# Patient Record
Sex: Female | Born: 1965
Health system: Southern US, Community
[De-identification: ages and names within clinical notes are randomized; demographics above are authoritative.]

## PROBLEM LIST (undated history)

## (undated) ENCOUNTER — Inpatient Hospital Stay (HOSPITAL_COMMUNITY): Payer: 59

## (undated) DIAGNOSIS — B379 Candidiasis, unspecified: Secondary | ICD-10-CM

## (undated) DIAGNOSIS — Z8619 Personal history of other infectious and parasitic diseases: Secondary | ICD-10-CM

## (undated) DIAGNOSIS — M810 Age-related osteoporosis without current pathological fracture: Secondary | ICD-10-CM

## (undated) DIAGNOSIS — N189 Chronic kidney disease, unspecified: Secondary | ICD-10-CM

## (undated) DIAGNOSIS — K219 Gastro-esophageal reflux disease without esophagitis: Secondary | ICD-10-CM

## (undated) DIAGNOSIS — F4321 Adjustment disorder with depressed mood: Secondary | ICD-10-CM

## (undated) DIAGNOSIS — N393 Stress incontinence (female) (male): Secondary | ICD-10-CM

## (undated) DIAGNOSIS — F5105 Insomnia due to other mental disorder: Secondary | ICD-10-CM

## (undated) DIAGNOSIS — T8859XA Other complications of anesthesia, initial encounter: Secondary | ICD-10-CM

## (undated) DIAGNOSIS — D649 Anemia, unspecified: Secondary | ICD-10-CM

## (undated) DIAGNOSIS — N760 Acute vaginitis: Secondary | ICD-10-CM

## (undated) DIAGNOSIS — M6289 Other specified disorders of muscle: Secondary | ICD-10-CM

## (undated) DIAGNOSIS — G8929 Other chronic pain: Secondary | ICD-10-CM

## (undated) DIAGNOSIS — Z9884 Bariatric surgery status: Secondary | ICD-10-CM

## (undated) DIAGNOSIS — F332 Major depressive disorder, recurrent severe without psychotic features: Secondary | ICD-10-CM

## (undated) DIAGNOSIS — F4312 Post-traumatic stress disorder, chronic: Secondary | ICD-10-CM

## (undated) DIAGNOSIS — E785 Hyperlipidemia, unspecified: Secondary | ICD-10-CM

## (undated) DIAGNOSIS — G4730 Sleep apnea, unspecified: Secondary | ICD-10-CM

## (undated) DIAGNOSIS — G4700 Insomnia, unspecified: Secondary | ICD-10-CM

## (undated) DIAGNOSIS — R945 Abnormal results of liver function studies: Secondary | ICD-10-CM

## (undated) DIAGNOSIS — K8020 Calculus of gallbladder without cholecystitis without obstruction: Secondary | ICD-10-CM

## (undated) DIAGNOSIS — R5383 Other fatigue: Secondary | ICD-10-CM

## (undated) DIAGNOSIS — N952 Postmenopausal atrophic vaginitis: Secondary | ICD-10-CM

## (undated) DIAGNOSIS — B9689 Other specified bacterial agents as the cause of diseases classified elsewhere: Secondary | ICD-10-CM

## (undated) DIAGNOSIS — F419 Anxiety disorder, unspecified: Secondary | ICD-10-CM

## (undated) DIAGNOSIS — E559 Vitamin D deficiency, unspecified: Secondary | ICD-10-CM

## (undated) DIAGNOSIS — R5381 Other malaise: Secondary | ICD-10-CM

## (undated) DIAGNOSIS — Z87440 Personal history of urinary (tract) infections: Secondary | ICD-10-CM

## (undated) DIAGNOSIS — T7840XA Allergy, unspecified, initial encounter: Secondary | ICD-10-CM

## (undated) DIAGNOSIS — R1032 Left lower quadrant pain: Secondary | ICD-10-CM

## (undated) DIAGNOSIS — A599 Trichomoniasis, unspecified: Secondary | ICD-10-CM

## (undated) DIAGNOSIS — M549 Dorsalgia, unspecified: Secondary | ICD-10-CM

## (undated) DIAGNOSIS — N809 Endometriosis, unspecified: Secondary | ICD-10-CM

## (undated) DIAGNOSIS — N9089 Other specified noninflammatory disorders of vulva and perineum: Secondary | ICD-10-CM

## (undated) DIAGNOSIS — R160 Hepatomegaly, not elsewhere classified: Secondary | ICD-10-CM

## (undated) DIAGNOSIS — R7989 Other specified abnormal findings of blood chemistry: Secondary | ICD-10-CM

## (undated) DIAGNOSIS — M1990 Unspecified osteoarthritis, unspecified site: Secondary | ICD-10-CM

## (undated) DIAGNOSIS — I10 Essential (primary) hypertension: Secondary | ICD-10-CM

## (undated) DIAGNOSIS — T4145XA Adverse effect of unspecified anesthetic, initial encounter: Secondary | ICD-10-CM

## (undated) HISTORY — DX: Insomnia, unspecified: G47.00

## (undated) HISTORY — DX: Other specified abnormal findings of blood chemistry: R79.89

## (undated) HISTORY — DX: Anxiety disorder, unspecified: F41.9

## (undated) HISTORY — DX: Personal history of other infectious and parasitic diseases: Z86.19

## (undated) HISTORY — DX: Other complications of anesthesia, initial encounter: T88.59XA

## (undated) HISTORY — DX: Trichomoniasis, unspecified: A59.9

## (undated) HISTORY — PX: KNEE ARTHROSCOPY: SUR90

## (undated) HISTORY — DX: Left lower quadrant pain: R10.32

## (undated) HISTORY — DX: Endometriosis, unspecified: N80.9

## (undated) HISTORY — DX: Hyperlipidemia, unspecified: E78.5

## (undated) HISTORY — DX: Allergy, unspecified, initial encounter: T78.40XA

## (undated) HISTORY — DX: Other chronic pain: G89.29

## (undated) HISTORY — DX: Acute vaginitis: N76.0

## (undated) HISTORY — DX: Other specified noninflammatory disorders of vulva and perineum: N90.89

## (undated) HISTORY — DX: Dorsalgia, unspecified: M54.9

## (undated) HISTORY — DX: Calculus of gallbladder without cholecystitis without obstruction: K80.20

## (undated) HISTORY — DX: Other fatigue: R53.83

## (undated) HISTORY — DX: Age-related osteoporosis without current pathological fracture: M81.0

## (undated) HISTORY — DX: Other malaise: R53.81

## (undated) HISTORY — DX: Other specified bacterial agents as the cause of diseases classified elsewhere: B96.89

## (undated) HISTORY — PX: WISDOM TOOTH EXTRACTION: SHX21

## (undated) HISTORY — DX: Candidiasis, unspecified: B37.9

## (undated) HISTORY — DX: Adverse effect of unspecified anesthetic, initial encounter: T41.45XA

## (undated) HISTORY — DX: Gastro-esophageal reflux disease without esophagitis: K21.9

## (undated) HISTORY — DX: Unspecified osteoarthritis, unspecified site: M19.90

## (undated) HISTORY — DX: Adjustment disorder with depressed mood: F43.21

## (undated) HISTORY — PX: LAPAROTOMY: SHX154

## (undated) HISTORY — DX: Chronic kidney disease, unspecified: N18.9

## (undated) HISTORY — DX: Other specified disorders of muscle: M62.89

## (undated) HISTORY — DX: Morbid (severe) obesity due to excess calories: E66.01

## (undated) HISTORY — DX: Sleep apnea, unspecified: G47.30

## (undated) HISTORY — DX: Postmenopausal atrophic vaginitis: N95.2

## (undated) HISTORY — DX: Personal history of urinary (tract) infections: Z87.440

## (undated) HISTORY — DX: Essential (primary) hypertension: I10

## (undated) HISTORY — PX: DILATION AND CURETTAGE OF UTERUS: SHX78

## (undated) HISTORY — DX: Bariatric surgery status: Z98.84

## (undated) HISTORY — PX: TONSILLECTOMY: SUR1361

## (undated) HISTORY — DX: Insomnia due to other mental disorder: F51.05

## (undated) HISTORY — DX: Anemia, unspecified: D64.9

## (undated) HISTORY — DX: Abnormal results of liver function studies: R94.5

## (undated) HISTORY — DX: Vitamin D deficiency, unspecified: E55.9

## (undated) HISTORY — DX: Stress incontinence (female) (male): N39.3

## (undated) HISTORY — DX: Hepatomegaly, not elsewhere classified: R16.0

---

## 1997-09-13 ENCOUNTER — Ambulatory Visit (HOSPITAL_COMMUNITY): Admission: RE | Admit: 1997-09-13 | Discharge: 1997-09-13 | Payer: Self-pay | Admitting: Obstetrics and Gynecology

## 1997-12-03 ENCOUNTER — Ambulatory Visit (HOSPITAL_COMMUNITY): Admission: RE | Admit: 1997-12-03 | Discharge: 1997-12-03 | Payer: Self-pay | Admitting: Internal Medicine

## 1998-01-29 ENCOUNTER — Ambulatory Visit (HOSPITAL_COMMUNITY): Admission: RE | Admit: 1998-01-29 | Discharge: 1998-01-29 | Payer: Self-pay | Admitting: Internal Medicine

## 1998-09-16 ENCOUNTER — Inpatient Hospital Stay (HOSPITAL_COMMUNITY): Admission: AD | Admit: 1998-09-16 | Discharge: 1998-09-16 | Payer: Self-pay | Admitting: Cardiology

## 1998-09-17 ENCOUNTER — Encounter: Payer: Self-pay | Admitting: Internal Medicine

## 1998-09-17 ENCOUNTER — Encounter: Payer: Self-pay | Admitting: Cardiology

## 1998-09-17 ENCOUNTER — Encounter: Payer: Self-pay | Admitting: Emergency Medicine

## 1998-09-17 ENCOUNTER — Inpatient Hospital Stay (HOSPITAL_COMMUNITY): Admission: EM | Admit: 1998-09-17 | Discharge: 1998-09-23 | Payer: Self-pay | Admitting: Emergency Medicine

## 1998-11-10 ENCOUNTER — Ambulatory Visit (HOSPITAL_COMMUNITY): Admission: RE | Admit: 1998-11-10 | Discharge: 1998-11-10 | Payer: Self-pay | Admitting: Internal Medicine

## 1998-11-10 ENCOUNTER — Encounter: Payer: Self-pay | Admitting: Internal Medicine

## 1998-12-11 ENCOUNTER — Other Ambulatory Visit: Admission: RE | Admit: 1998-12-11 | Discharge: 1998-12-11 | Payer: Self-pay | Admitting: *Deleted

## 1998-12-27 ENCOUNTER — Emergency Department (HOSPITAL_COMMUNITY): Admission: EM | Admit: 1998-12-27 | Discharge: 1998-12-27 | Payer: Self-pay | Admitting: Emergency Medicine

## 1999-01-16 ENCOUNTER — Encounter: Admission: RE | Admit: 1999-01-16 | Discharge: 1999-04-01 | Payer: Self-pay | Admitting: Internal Medicine

## 1999-02-03 ENCOUNTER — Emergency Department (HOSPITAL_COMMUNITY): Admission: EM | Admit: 1999-02-03 | Discharge: 1999-02-04 | Payer: Self-pay | Admitting: Emergency Medicine

## 1999-02-04 ENCOUNTER — Encounter: Payer: Self-pay | Admitting: Emergency Medicine

## 1999-03-02 HISTORY — PX: APPENDECTOMY: SHX54

## 1999-03-02 HISTORY — PX: ABDOMINAL HYSTERECTOMY: SHX81

## 1999-03-11 ENCOUNTER — Encounter: Admission: RE | Admit: 1999-03-11 | Discharge: 1999-03-11 | Payer: Self-pay | Admitting: Orthopedic Surgery

## 1999-03-11 ENCOUNTER — Encounter: Payer: Self-pay | Admitting: Orthopedic Surgery

## 1999-03-24 ENCOUNTER — Encounter: Admission: RE | Admit: 1999-03-24 | Discharge: 1999-04-17 | Payer: Self-pay | Admitting: Orthopedic Surgery

## 1999-05-01 ENCOUNTER — Emergency Department (HOSPITAL_COMMUNITY): Admission: EM | Admit: 1999-05-01 | Discharge: 1999-05-01 | Payer: Self-pay | Admitting: Emergency Medicine

## 1999-05-08 ENCOUNTER — Encounter: Payer: Self-pay | Admitting: *Deleted

## 1999-05-08 ENCOUNTER — Encounter: Admission: RE | Admit: 1999-05-08 | Discharge: 1999-05-08 | Payer: Self-pay | Admitting: *Deleted

## 1999-05-19 ENCOUNTER — Encounter: Admission: RE | Admit: 1999-05-19 | Discharge: 1999-08-17 | Payer: Self-pay | Admitting: Internal Medicine

## 1999-09-04 ENCOUNTER — Inpatient Hospital Stay (HOSPITAL_COMMUNITY): Admission: AD | Admit: 1999-09-04 | Discharge: 1999-09-04 | Payer: Self-pay | Admitting: *Deleted

## 1999-09-05 ENCOUNTER — Inpatient Hospital Stay (HOSPITAL_COMMUNITY): Admission: AD | Admit: 1999-09-05 | Discharge: 1999-09-05 | Payer: Self-pay | Admitting: *Deleted

## 1999-09-18 ENCOUNTER — Encounter (INDEPENDENT_AMBULATORY_CARE_PROVIDER_SITE_OTHER): Payer: Self-pay

## 1999-09-18 ENCOUNTER — Inpatient Hospital Stay (HOSPITAL_COMMUNITY): Admission: RE | Admit: 1999-09-18 | Discharge: 1999-09-25 | Payer: Self-pay | Admitting: *Deleted

## 1999-09-22 ENCOUNTER — Encounter: Payer: Self-pay | Admitting: *Deleted

## 1999-09-24 ENCOUNTER — Encounter: Payer: Self-pay | Admitting: *Deleted

## 1999-09-24 ENCOUNTER — Encounter: Payer: Self-pay | Admitting: Obstetrics and Gynecology

## 1999-11-11 ENCOUNTER — Encounter: Admission: RE | Admit: 1999-11-11 | Discharge: 2000-02-09 | Payer: Self-pay | Admitting: Internal Medicine

## 2000-03-19 ENCOUNTER — Encounter: Payer: Self-pay | Admitting: Emergency Medicine

## 2000-03-19 ENCOUNTER — Emergency Department (HOSPITAL_COMMUNITY): Admission: EM | Admit: 2000-03-19 | Discharge: 2000-03-20 | Payer: Self-pay | Admitting: Emergency Medicine

## 2000-05-06 ENCOUNTER — Emergency Department (HOSPITAL_COMMUNITY): Admission: EM | Admit: 2000-05-06 | Discharge: 2000-05-06 | Payer: Self-pay | Admitting: Emergency Medicine

## 2000-12-06 ENCOUNTER — Encounter: Payer: Self-pay | Admitting: Emergency Medicine

## 2000-12-06 ENCOUNTER — Emergency Department (HOSPITAL_COMMUNITY): Admission: EM | Admit: 2000-12-06 | Discharge: 2000-12-06 | Payer: Self-pay | Admitting: Emergency Medicine

## 2001-06-12 ENCOUNTER — Encounter: Admission: RE | Admit: 2001-06-12 | Discharge: 2001-09-10 | Payer: Self-pay | Admitting: Internal Medicine

## 2001-07-17 ENCOUNTER — Other Ambulatory Visit: Admission: RE | Admit: 2001-07-17 | Discharge: 2001-07-17 | Payer: Self-pay | Admitting: *Deleted

## 2002-03-04 ENCOUNTER — Encounter: Payer: Self-pay | Admitting: Emergency Medicine

## 2002-03-04 ENCOUNTER — Emergency Department (HOSPITAL_COMMUNITY): Admission: EM | Admit: 2002-03-04 | Discharge: 2002-03-04 | Payer: Self-pay | Admitting: Emergency Medicine

## 2002-05-25 ENCOUNTER — Encounter: Payer: Self-pay | Admitting: Internal Medicine

## 2002-05-25 ENCOUNTER — Encounter: Admission: RE | Admit: 2002-05-25 | Discharge: 2002-05-25 | Payer: Self-pay | Admitting: Internal Medicine

## 2002-07-11 ENCOUNTER — Ambulatory Visit (HOSPITAL_COMMUNITY): Admission: RE | Admit: 2002-07-11 | Discharge: 2002-07-11 | Payer: Self-pay | Admitting: *Deleted

## 2002-07-11 ENCOUNTER — Encounter: Payer: Self-pay | Admitting: *Deleted

## 2002-08-01 ENCOUNTER — Other Ambulatory Visit: Admission: RE | Admit: 2002-08-01 | Discharge: 2002-08-01 | Payer: Self-pay | Admitting: *Deleted

## 2002-08-03 ENCOUNTER — Encounter: Admission: RE | Admit: 2002-08-03 | Discharge: 2002-08-03 | Payer: Self-pay | Admitting: *Deleted

## 2002-08-03 ENCOUNTER — Encounter: Payer: Self-pay | Admitting: *Deleted

## 2002-12-23 ENCOUNTER — Inpatient Hospital Stay (HOSPITAL_COMMUNITY): Admission: AD | Admit: 2002-12-23 | Discharge: 2002-12-23 | Payer: Self-pay | Admitting: Obstetrics and Gynecology

## 2003-03-02 DIAGNOSIS — G4730 Sleep apnea, unspecified: Secondary | ICD-10-CM

## 2003-03-02 HISTORY — DX: Sleep apnea, unspecified: G47.30

## 2003-03-02 HISTORY — DX: Morbid (severe) obesity due to excess calories: E66.01

## 2003-03-08 ENCOUNTER — Emergency Department (HOSPITAL_COMMUNITY): Admission: EM | Admit: 2003-03-08 | Discharge: 2003-03-08 | Payer: Self-pay | Admitting: Emergency Medicine

## 2003-03-21 DIAGNOSIS — N9089 Other specified noninflammatory disorders of vulva and perineum: Secondary | ICD-10-CM

## 2003-03-21 DIAGNOSIS — Z8619 Personal history of other infectious and parasitic diseases: Secondary | ICD-10-CM

## 2003-03-21 HISTORY — DX: Personal history of other infectious and parasitic diseases: Z86.19

## 2003-03-21 HISTORY — DX: Other specified noninflammatory disorders of vulva and perineum: N90.89

## 2003-07-30 ENCOUNTER — Ambulatory Visit (HOSPITAL_COMMUNITY): Admission: RE | Admit: 2003-07-30 | Discharge: 2003-07-30 | Payer: Self-pay | Admitting: Obstetrics and Gynecology

## 2003-08-12 ENCOUNTER — Other Ambulatory Visit: Admission: RE | Admit: 2003-08-12 | Discharge: 2003-08-12 | Payer: Self-pay | Admitting: Obstetrics and Gynecology

## 2003-08-12 DIAGNOSIS — N393 Stress incontinence (female) (male): Secondary | ICD-10-CM

## 2003-08-12 HISTORY — DX: Stress incontinence (female) (male): N39.3

## 2003-08-18 ENCOUNTER — Emergency Department (HOSPITAL_COMMUNITY): Admission: EM | Admit: 2003-08-18 | Discharge: 2003-08-18 | Payer: Self-pay | Admitting: Emergency Medicine

## 2003-12-01 ENCOUNTER — Ambulatory Visit (HOSPITAL_BASED_OUTPATIENT_CLINIC_OR_DEPARTMENT_OTHER): Admission: RE | Admit: 2003-12-01 | Discharge: 2003-12-01 | Payer: Self-pay | Admitting: Cardiology

## 2003-12-10 DIAGNOSIS — M1990 Unspecified osteoarthritis, unspecified site: Secondary | ICD-10-CM

## 2003-12-10 HISTORY — DX: Unspecified osteoarthritis, unspecified site: M19.90

## 2004-03-01 DIAGNOSIS — B9689 Other specified bacterial agents as the cause of diseases classified elsewhere: Secondary | ICD-10-CM

## 2004-03-01 HISTORY — DX: Other specified bacterial agents as the cause of diseases classified elsewhere: B96.89

## 2004-04-22 ENCOUNTER — Ambulatory Visit (HOSPITAL_COMMUNITY): Admission: RE | Admit: 2004-04-22 | Discharge: 2004-04-22 | Payer: Self-pay | Admitting: Family Medicine

## 2004-04-28 ENCOUNTER — Ambulatory Visit (HOSPITAL_COMMUNITY): Admission: RE | Admit: 2004-04-28 | Discharge: 2004-04-28 | Payer: Self-pay | Admitting: Gastroenterology

## 2004-05-01 ENCOUNTER — Ambulatory Visit (HOSPITAL_COMMUNITY): Admission: RE | Admit: 2004-05-01 | Discharge: 2004-05-01 | Payer: Self-pay | Admitting: Gastroenterology

## 2004-05-04 ENCOUNTER — Ambulatory Visit (HOSPITAL_COMMUNITY): Admission: RE | Admit: 2004-05-04 | Discharge: 2004-05-04 | Payer: Self-pay | Admitting: Gastroenterology

## 2004-07-09 ENCOUNTER — Ambulatory Visit (HOSPITAL_COMMUNITY): Admission: RE | Admit: 2004-07-09 | Discharge: 2004-07-09 | Payer: Self-pay | Admitting: Obstetrics and Gynecology

## 2004-07-30 ENCOUNTER — Ambulatory Visit (HOSPITAL_COMMUNITY): Admission: RE | Admit: 2004-07-30 | Discharge: 2004-07-30 | Payer: Self-pay | Admitting: Obstetrics and Gynecology

## 2004-08-11 ENCOUNTER — Encounter: Admission: RE | Admit: 2004-08-11 | Discharge: 2004-11-09 | Payer: Self-pay | Admitting: Internal Medicine

## 2004-08-13 ENCOUNTER — Other Ambulatory Visit: Admission: RE | Admit: 2004-08-13 | Discharge: 2004-08-13 | Payer: Self-pay | Admitting: Obstetrics and Gynecology

## 2004-08-13 DIAGNOSIS — R160 Hepatomegaly, not elsewhere classified: Secondary | ICD-10-CM

## 2004-08-13 HISTORY — DX: Hepatomegaly, not elsewhere classified: R16.0

## 2004-09-14 ENCOUNTER — Ambulatory Visit (HOSPITAL_COMMUNITY): Admission: RE | Admit: 2004-09-14 | Discharge: 2004-09-14 | Payer: Self-pay | Admitting: Internal Medicine

## 2004-10-19 ENCOUNTER — Ambulatory Visit (HOSPITAL_COMMUNITY): Admission: RE | Admit: 2004-10-19 | Discharge: 2004-10-19 | Payer: Self-pay | Admitting: Gastroenterology

## 2004-12-09 ENCOUNTER — Encounter: Admission: RE | Admit: 2004-12-09 | Discharge: 2005-02-28 | Payer: Self-pay | Admitting: Internal Medicine

## 2005-03-01 HISTORY — PX: GASTRIC BYPASS: SHX52

## 2005-05-06 ENCOUNTER — Inpatient Hospital Stay (HOSPITAL_COMMUNITY): Admission: AD | Admit: 2005-05-06 | Discharge: 2005-05-06 | Payer: Self-pay | Admitting: Obstetrics and Gynecology

## 2005-05-21 ENCOUNTER — Ambulatory Visit (HOSPITAL_COMMUNITY): Admission: RE | Admit: 2005-05-21 | Discharge: 2005-05-21 | Payer: Self-pay | Admitting: Surgery

## 2005-05-27 ENCOUNTER — Encounter: Admission: RE | Admit: 2005-05-27 | Discharge: 2005-08-12 | Payer: Self-pay | Admitting: Surgery

## 2005-08-03 ENCOUNTER — Inpatient Hospital Stay (HOSPITAL_COMMUNITY): Admission: RE | Admit: 2005-08-03 | Discharge: 2005-08-08 | Payer: Self-pay | Admitting: Surgery

## 2005-08-04 ENCOUNTER — Encounter: Payer: Self-pay | Admitting: Vascular Surgery

## 2005-08-16 ENCOUNTER — Emergency Department (HOSPITAL_COMMUNITY): Admission: EM | Admit: 2005-08-16 | Discharge: 2005-08-16 | Payer: Self-pay | Admitting: Emergency Medicine

## 2005-08-26 ENCOUNTER — Ambulatory Visit (HOSPITAL_COMMUNITY): Admission: RE | Admit: 2005-08-26 | Discharge: 2005-08-26 | Payer: Self-pay | Admitting: Surgery

## 2005-10-13 ENCOUNTER — Emergency Department (HOSPITAL_COMMUNITY): Admission: EM | Admit: 2005-10-13 | Discharge: 2005-10-14 | Payer: Self-pay | Admitting: Emergency Medicine

## 2005-11-11 ENCOUNTER — Ambulatory Visit (HOSPITAL_COMMUNITY): Admission: RE | Admit: 2005-11-11 | Discharge: 2005-11-11 | Payer: Self-pay | Admitting: Obstetrics and Gynecology

## 2005-12-17 ENCOUNTER — Encounter: Admission: RE | Admit: 2005-12-17 | Discharge: 2006-03-17 | Payer: Self-pay | Admitting: Surgery

## 2006-01-03 ENCOUNTER — Other Ambulatory Visit: Admission: RE | Admit: 2006-01-03 | Discharge: 2006-01-03 | Payer: Self-pay | Admitting: Obstetrics and Gynecology

## 2006-03-31 ENCOUNTER — Ambulatory Visit (HOSPITAL_COMMUNITY): Admission: RE | Admit: 2006-03-31 | Discharge: 2006-03-31 | Payer: Self-pay | Admitting: Surgery

## 2006-04-20 ENCOUNTER — Encounter: Admission: RE | Admit: 2006-04-20 | Discharge: 2006-07-19 | Payer: Self-pay | Admitting: Surgery

## 2006-06-19 ENCOUNTER — Inpatient Hospital Stay (HOSPITAL_COMMUNITY): Admission: AD | Admit: 2006-06-19 | Discharge: 2006-06-19 | Payer: Self-pay | Admitting: Obstetrics and Gynecology

## 2006-06-19 ENCOUNTER — Emergency Department (HOSPITAL_COMMUNITY): Admission: EM | Admit: 2006-06-19 | Discharge: 2006-06-20 | Payer: Self-pay | Admitting: Emergency Medicine

## 2006-08-15 ENCOUNTER — Encounter: Admission: RE | Admit: 2006-08-15 | Discharge: 2006-08-15 | Payer: Self-pay | Admitting: Surgery

## 2006-08-22 ENCOUNTER — Emergency Department (HOSPITAL_COMMUNITY): Admission: EM | Admit: 2006-08-22 | Discharge: 2006-08-22 | Payer: Self-pay | Admitting: Emergency Medicine

## 2006-12-07 ENCOUNTER — Ambulatory Visit (HOSPITAL_COMMUNITY): Admission: RE | Admit: 2006-12-07 | Discharge: 2006-12-07 | Payer: Self-pay | Admitting: Obstetrics and Gynecology

## 2007-03-02 DIAGNOSIS — N952 Postmenopausal atrophic vaginitis: Secondary | ICD-10-CM

## 2007-03-02 HISTORY — DX: Postmenopausal atrophic vaginitis: N95.2

## 2007-05-21 ENCOUNTER — Emergency Department (HOSPITAL_COMMUNITY): Admission: EM | Admit: 2007-05-21 | Discharge: 2007-05-21 | Payer: Self-pay | Admitting: Family Medicine

## 2007-06-06 DIAGNOSIS — F4321 Adjustment disorder with depressed mood: Secondary | ICD-10-CM

## 2007-06-06 HISTORY — DX: Insomnia due to other mental disorder: F43.21

## 2007-07-04 ENCOUNTER — Encounter: Admission: RE | Admit: 2007-07-04 | Discharge: 2007-07-18 | Payer: Self-pay | Admitting: Surgery

## 2007-08-19 ENCOUNTER — Ambulatory Visit (HOSPITAL_BASED_OUTPATIENT_CLINIC_OR_DEPARTMENT_OTHER): Admission: RE | Admit: 2007-08-19 | Discharge: 2007-08-19 | Payer: Self-pay | Admitting: Surgery

## 2007-08-27 ENCOUNTER — Ambulatory Visit: Payer: Self-pay | Admitting: Internal Medicine

## 2007-12-05 ENCOUNTER — Encounter: Admission: RE | Admit: 2007-12-05 | Discharge: 2008-01-29 | Payer: Self-pay | Admitting: Surgery

## 2007-12-08 ENCOUNTER — Ambulatory Visit (HOSPITAL_COMMUNITY): Admission: RE | Admit: 2007-12-08 | Discharge: 2007-12-08 | Payer: Self-pay | Admitting: Obstetrics and Gynecology

## 2008-01-29 ENCOUNTER — Emergency Department (HOSPITAL_COMMUNITY): Admission: EM | Admit: 2008-01-29 | Discharge: 2008-01-29 | Payer: Self-pay | Admitting: Emergency Medicine

## 2008-12-27 ENCOUNTER — Emergency Department (HOSPITAL_COMMUNITY): Admission: EM | Admit: 2008-12-27 | Discharge: 2008-12-27 | Payer: Self-pay | Admitting: Family Medicine

## 2008-12-31 ENCOUNTER — Ambulatory Visit (HOSPITAL_COMMUNITY): Admission: RE | Admit: 2008-12-31 | Discharge: 2008-12-31 | Payer: Self-pay | Admitting: Obstetrics and Gynecology

## 2009-01-05 ENCOUNTER — Ambulatory Visit (HOSPITAL_COMMUNITY): Admission: RE | Admit: 2009-01-05 | Discharge: 2009-01-05 | Payer: Self-pay | Admitting: Podiatry

## 2009-03-01 HISTORY — PX: CHOLECYSTECTOMY: SHX55

## 2009-06-01 ENCOUNTER — Emergency Department (HOSPITAL_COMMUNITY): Admission: EM | Admit: 2009-06-01 | Discharge: 2009-06-01 | Payer: Self-pay | Admitting: Family Medicine

## 2009-06-02 ENCOUNTER — Encounter: Admission: RE | Admit: 2009-06-02 | Discharge: 2009-06-02 | Payer: Self-pay | Admitting: Surgery

## 2009-08-13 ENCOUNTER — Encounter: Admission: RE | Admit: 2009-08-13 | Discharge: 2009-08-13 | Payer: Self-pay | Admitting: Surgery

## 2009-09-10 ENCOUNTER — Inpatient Hospital Stay (HOSPITAL_COMMUNITY): Admission: EM | Admit: 2009-09-10 | Discharge: 2009-09-13 | Payer: Self-pay | Admitting: Surgery

## 2009-11-15 ENCOUNTER — Ambulatory Visit (HOSPITAL_COMMUNITY): Admission: RE | Admit: 2009-11-15 | Discharge: 2009-11-15 | Payer: Self-pay | Admitting: Surgery

## 2009-11-25 ENCOUNTER — Ambulatory Visit (HOSPITAL_COMMUNITY): Admission: RE | Admit: 2009-11-25 | Discharge: 2009-11-25 | Payer: Self-pay | Admitting: Surgery

## 2009-12-02 ENCOUNTER — Ambulatory Visit (HOSPITAL_COMMUNITY): Admission: RE | Admit: 2009-12-02 | Discharge: 2009-12-03 | Payer: Self-pay | Admitting: Surgery

## 2009-12-02 ENCOUNTER — Encounter (INDEPENDENT_AMBULATORY_CARE_PROVIDER_SITE_OTHER): Payer: Self-pay | Admitting: Surgery

## 2010-01-12 ENCOUNTER — Ambulatory Visit (HOSPITAL_COMMUNITY): Admission: RE | Admit: 2010-01-12 | Discharge: 2010-01-12 | Payer: Self-pay | Admitting: Obstetrics and Gynecology

## 2010-03-01 DIAGNOSIS — R5383 Other fatigue: Secondary | ICD-10-CM

## 2010-03-01 DIAGNOSIS — R5381 Other malaise: Secondary | ICD-10-CM

## 2010-03-01 HISTORY — DX: Other fatigue: R53.83

## 2010-03-01 HISTORY — DX: Other malaise: R53.81

## 2010-03-22 ENCOUNTER — Encounter: Payer: Self-pay | Admitting: Obstetrics and Gynecology

## 2010-03-22 ENCOUNTER — Encounter: Payer: Self-pay | Admitting: Surgery

## 2010-03-23 ENCOUNTER — Encounter: Payer: Self-pay | Admitting: Internal Medicine

## 2010-05-14 LAB — COMPREHENSIVE METABOLIC PANEL
ALT: 61 U/L — ABNORMAL HIGH (ref 0–35)
CO2: 33 mEq/L — ABNORMAL HIGH (ref 19–32)
Calcium: 9.1 mg/dL (ref 8.4–10.5)
GFR calc Af Amer: >60 mL/min (ref >60)
Glucose, Bld: 103 mg/dL — ABNORMAL HIGH (ref 70–99)
Sodium: 137 mEq/L (ref 135–145)

## 2010-05-14 LAB — SURGICAL PCR SCREEN
MRSA, PCR: NEGATIVE
Staphylococcus aureus: NEGATIVE

## 2010-05-14 LAB — CBC
HCT: 38.3 % (ref 36.0–46.0)
Hemoglobin: 12.2 g/dL (ref 12.0–15.0)
MCH: 28.4 pg (ref 26.0–34.0)
MCV: 86.9 fL (ref 78.0–100.0)
MCV: 88.5 fL (ref 78.0–100.0)
Platelets: 243 10*3/uL (ref 150–400)
Platelets: 245 10*3/uL (ref 150–400)
RBC: 4.33 MIL/uL (ref 3.87–5.11)
RBC: 4.51 MIL/uL (ref 3.87–5.11)
RDW: 14 % (ref 11.5–15.5)
WBC: 7.6 10*3/uL (ref 4.0–10.5)

## 2010-05-14 LAB — CREATININE, SERUM
Creatinine, Ser: 0.8 mg/dL (ref 0.4–1.2)
GFR calc non Af Amer: >60 mL/min (ref >60)

## 2010-05-14 LAB — DIFFERENTIAL
Eosinophils Relative: 0 % (ref 0–5)
Lymphocytes Relative: 26 % (ref 12–46)

## 2010-05-16 LAB — CBC
MCH: 28.4 pg (ref 26.0–34.0)
MCHC: 33 g/dL (ref 30.0–36.0)
RDW: 13.7 % (ref 11.5–15.5)

## 2010-05-16 LAB — DIFFERENTIAL
Basophils Relative: 0 % (ref 0–1)
Eosinophils Absolute: 0 10*3/uL (ref 0.0–0.7)
Eosinophils Relative: 1 % (ref 0–5)
Monocytes Absolute: 0.4 10*3/uL (ref 0.1–1.0)
Monocytes Relative: 11 % (ref 3–12)
Neutrophils Relative %: 44 % (ref 43–77)

## 2010-05-16 LAB — CLOTEST (H. PYLORI), BIOPSY: Helicobacter screen: NEGATIVE

## 2010-05-17 LAB — URINALYSIS, ROUTINE W REFLEX MICROSCOPIC
Bilirubin Urine: NEGATIVE
Hgb urine dipstick: NEGATIVE
Ketones, ur: 15 mg/dL — AB
Nitrite: NEGATIVE
Specific Gravity, Urine: 1.023 (ref 1.005–1.030)
Urobilinogen, UA: 0.2 mg/dL (ref 0.0–1.0)
pH: 7 (ref 5.0–8.0)

## 2010-05-17 LAB — CBC
MCV: 86.2 fL (ref 78.0–100.0)
Platelets: 264 10*3/uL (ref 150–400)
RDW: 14 % (ref 11.5–15.5)
WBC: 4.7 10*3/uL (ref 4.0–10.5)

## 2010-05-17 LAB — COMPREHENSIVE METABOLIC PANEL
Albumin: 3.8 g/dL (ref 3.5–5.2)
Alkaline Phosphatase: 68 U/L (ref 39–117)
BUN: 15 mg/dL (ref 6–23)
Calcium: 9.1 mg/dL (ref 8.4–10.5)
Creatinine, Ser: 0.92 mg/dL (ref 0.4–1.2)
Potassium: 3.9 mEq/L (ref 3.5–5.1)
Total Protein: 6.8 g/dL (ref 6.0–8.3)

## 2010-06-25 ENCOUNTER — Other Ambulatory Visit: Payer: Self-pay | Admitting: Orthopaedic Surgery

## 2010-06-25 DIAGNOSIS — M545 Low back pain, unspecified: Secondary | ICD-10-CM

## 2010-06-28 ENCOUNTER — Ambulatory Visit
Admission: RE | Admit: 2010-06-28 | Discharge: 2010-06-28 | Disposition: A | Discharge status: Home / Self Care | Payer: Commercial Managed Care - PPO | Source: Ambulatory Visit | Attending: Orthopaedic Surgery | Admitting: Orthopaedic Surgery

## 2010-06-28 DIAGNOSIS — M545 Low back pain, unspecified: Secondary | ICD-10-CM

## 2010-07-17 NOTE — H&P (Signed)
[facility]  Patient:    Carly Delacruz, Carly Delacruz                    MRN: [id number] Adm. Date:  [id number] Disc. Date: [id number] Attending:  Pleas Koch                         History and Physical  ADMISSION DIAGNOSIS:          Chronic pelvic pain and endometriosis unresponsive to medical management.  HISTORY OF PRESENT ILLNESS:   This is a 45 year old African-American female who is a gravida 0 with a history of endometriosis dating back to 85 and multiple laparoscopies diagnosing adhesions and endometriosis.  The patient had been on Lupron which had controlled her pain until recently when she had had increased pain and bleeding.  She had tried nonsteroidal anti-inflammatories, narcotics, oral contraceptives, and progestins, all without relief.  She is brought in for a total abdominal hysterectomy and left salpingo-oophorectomy as the majority of her pain is in the midline and on the left.  PAST MEDICAL HISTORY:         The patient has a history of hypertension on Maxzide.  She has a history of joint problems and she is on Celebrex.  PAST SURGICAL HISTORY:        The patient has had a previous appendectomy, knee surgery, tonsillectomy, and laparotomy.  ALLERGIES:                    The patient is allergic to [facility] and CONTRAST MEDIA.  REVIEW OF SYSTEMS:            Other review of systems is negative.  SOCIAL HISTORY:               The patient is a nonsmoker.  No recreational drugs or alcohol.  FAMILY HISTORY:               Positive for liver problems and hypertension.  PHYSICAL EXAMINATION:         An obese African-American female.  WEIGHT:                       283 pounds.  HEIGHT:                       5 feet 4-1/2 inches.  VITAL SIGNS:                  Blood pressure 130/88.  HEENT:                        Normal.  Thyroid without masses or enlargement.  LUNGS:                        Clear to auscultation.  BREASTS:                       Without masses.  HEART:                        Regular rate and rhythm.  ABDOMEN:                      Obese with tenderness in her left lower quadrant.  No rebound, mass, or guarding.  No CVA tenderness.  PELVIC:  Exam shows normal external genitalia and normal vagina.  No discharge.  No blood.  The cervix is nontender.  The uterus is normal in size, shape, and consistency and nonspecific nontender.  There is tenderness in the adnexa without appreciable mass.  The right adnexa is nontender.  IMPRESSION:                   Chronic pelvic pain and history of endometriosis unresponsive to medical management.  PLAN:                         The patient is scheduled for total abdominal hysterectomy and left salpingo-oophorectomy with right ovarian conservation if the right ovary is not significantly involved in the disease process.  The patient is aware of the risks and complications of the procedure, including bowel, bladder, or vascular injury, fistula formation unrelief of pain, persistence or worsening of pain, anesthetic complications, and wound infection.  She is willing to proceed.  She will proceed on September 18, 1999, at [facility]. DD:  09/17/99 TD:  09/17/99 Job: [id number]

## 2010-07-17 NOTE — Procedures (Signed)
NAMEKENLEI, Carly Delacruz             ACCOUNT NO.:  [id number]   MEDICAL RECORD NO.:  [id number]          PATIENT TYPE:  OUT   LOCATION:  SLEEP CENTER                 FACILITY:  [facility]   PHYSICIAN:  Clinton D. Maple Hudson, MD, FCCP, FACPDATE OF BIRTH:  13-Jun-1963   DATE OF STUDY:                            NOCTURNAL POLYSOMNOGRAM   REFERRING PHYSICIAN:  Molli Hazard B. Daphine Deutscher, MD   INDICATION FOR STUDY:  Hypersomnia with sleep apnea.   EPWORTH SLEEPINESS SCORE:  12/24.  BMI 36, weight 201 pounds, height 63  inches, neck 14 inches.   MEDICATIONS:  Home medications charted and reviewed.   SLEEP ARCHITECTURE:  Total sleep time 358 minutes with sleep efficiency  90.3%.  Stage I was 4.3%.  Stage II 75.7%.  Stage III absent.  REM 19.9%  of total sleep time.  Sleep latency 7 minutes.  REM latency 78.5  minutes.  Awake after sleep onset 29 minutes.  Arousal index 14.6.  Bedtime medication:  Ambien.   RESPIRATORY DATA:  Apnea/hypopnea index (AHI) 2.5 per hour.  Respiratory  disturbance index (RDI) 4.4 per hour.  A total of 15 events were scored  including 1 central apnea and 14 hypopneas.  Events were not positional.  REM AHI 2.5.   OXYGEN DATA:  Moderate snoring with oxygen desaturation to a nadir of  79%.  Mean oxygen saturation through the study was 94.6% on room air.   CARDIAC DATA:  Normal sinus rhythm.   MOVEMENT-PARASOMNIA:  No significant movement disturbance.  Bathroom x1.   IMPRESSIONS-RECOMMENDATIONS:  Occasional respiratory events within  normal limits.  AHI 2.5 per hour (normal range 0-5 per hour).  Moderate  snoring with oxygen desaturation to a nadir of 79% transiently.  Mean  saturation was 94.6% on room air.      Clinton D. Maple Hudson, MD, [facility], FACP  Diplomate, Biomedical engineer of Sleep Medicine  Electronically Signed     CDY/MEDQ  D:  08/26/2007 09:16:27  T:  08/26/2007 09:54:05  Job:  [id number]

## 2010-07-17 NOTE — Op Note (Signed)
NAMESHAMBRIA, Carly Delacruz             ACCOUNT NO.:  [id number]   MEDICAL RECORD NO.:  [id number]          PATIENT TYPE:  INP   LOCATION:  0153                         FACILITY:  [facility]   PHYSICIAN:  Alfonse Ras, MD   DATE OF BIRTH:  04-20-1965   DATE OF PROCEDURE:  08/03/2005  DATE OF DISCHARGE:                                 OPERATIVE REPORT   PREOPERATIVE DIAGNOSIS:  Status post laparoscopic Roux-en-Y gastric bypass.   POSTOPERATIVE DIAGNOSIS:  Status post laparoscopic Roux-en-Y gastric bypass,  evidence of patency of the gastrojejunostomy, but a leak at the  gastrojejunostomy.   PROCEDURE:  The Olympus endoscope was introduced orally into the patient at  the completion of laparoscopic Roux-en-Y gastric bypass.  The anastomosis  was patent, but we did see some bubbles with irrigation.  The endoscope was  left in place and Dr. Daphine Deutscher repaired the area of leak with interrupted  silks, and I re-endoscoped the patient.  That time again showed patency of  the anastomosis, but no evidence of leak.  The remainder of the operative  report will be dictated by Dr. Luretha Murphy.      Alfonse Ras, MD  Electronically Signed     KRE/MEDQ  D:  08/04/2005  T:  08/05/2005  Job:  [id number]

## 2010-07-17 NOTE — Op Note (Signed)
Carly Delacruz, Carly Delacruz             ACCOUNT NO.:  [id number]   MEDICAL RECORD NO.:  [id number]          PATIENT TYPE:  INP   LOCATION:  0153                         FACILITY:  [facility]   PHYSICIAN:  Thornton Park. Daphine Deutscher, MD  DATE OF BIRTH:  12/16/1965   DATE OF PROCEDURE:  08/03/2005  DATE OF DISCHARGE:                                 OPERATIVE REPORT   PROCEDURE:  Laparoscopic Roux-en-Y gastric bypass.   SURGEON:  Dr. Daphine Deutscher   ASSISTANT:  Alfonse Ras, M.D.   ANESTHESIA:  General endotracheal.   DESCRIPTION OF PROCEDURE:  Carly Delacruz is a 45 year old morbidly  obese patient who was taken to room one at [facility] on  August 03, 2005.  The patient has had previous laparotomies for endometriosis,  laparoscopy, hysterectomy, and appendectomy.  Abdomen was prepped with  chlorhexidine and draped sterilely.  I entered the abdomen through the left  upper quadrant with an OptiVu 0 degrees 11 mm trocar without difficulty.  Abdomen was insufflated.  A total a 7 trocars were used to including five  over midline for the [facility] retractor, two 12 in the right side one to  the left of midline.  Most striking upon entering the abdomen was  a dense  adhesion from the omentum to the anterior abdominal wall, presumably to her  previous midline up above the umbilicus.  These required take down with  harmonic scalpel.  She has had pain in a right-sided and I did not see  anything up over her liver or adhesions, but certainly these adhesions were  certainly pulling and could possibly cause some discomfort.  These were  lysed completely and I had to lyse some in the left lower quadrant to enable  the omentum which was quite heavy and large to be mobilized to enabled me to  see the small bowel.   After mobilizing all this for some hour I proceeded with the operation.  This included identifying the ligament of Treitz and counting down about 42  centimeters were upon I divided  bowel with a single application of the  echelon stapler with a white cartridge.  I opened the mesentery a little bit  more because I realized because of her large size I would need to the room  to get up over the mesentery.  I then put a stitch on the Roux-en-Y limb end  of this with a Penrose drain on it, and I then  measured approximately 110  mL at least Roux and then sewed a functional end-to-end using a side-to-side  technique, sewing these two antimesenteric borders together, opening on  either side on the antimesenteric border, inserting the 45 white cartridge  and creating a functional end-to-end anastomosis.  Common defect was closed  with two 2-0 Vicryls and was ultimately Tisseel'd to reinforce it.  The  mesenteric defect was closed with running 2-0 silk including an  antiobstruction stitch.   I then left that where it was and divided the omentum which was quite  immense down to the transverse colon.  I went up to the stomach and opened  along the cardia so I could have a target for dissection.  I then measured  down approximately 4 cm along lesser curvature, dissected behind the stomach  and went across that with several applications of the echelon stapler to  completely carve out a small pouch.  There was no bleeding noted on the  remnant and I did notice a little area along the staple line where it almost  looked like it had torn (see mucosa) and I went ahead and reinforced that  with running 2-0 Vicryls.   I then brought the Roux limb up and anchored along the back with a running 2-  0 Vicryl. The back row was placed.  I then put an extra 5 mm in the upper  abdomen to facilitate opening up the stomach pouch and the small intestine  which was a candy cane left Roux limb.  Once this was opened I did insert  the 45 stapler, firing that, creating a nice small opening.  I closed the  common defect with 2-0 Vicryl from either end and when completed I completed  a second layer  using a free suture of 2-0 Vicryl with lap ties at either  end.  Dr. Colin Benton then performed endoscopy and I did see some bubbles from the  left end where I then placed two new sutures where I felt like the gastric  jejunostomy needed to be reinforced.  After doing this there were no further  bubbles noted with fairly high pressure insufflation.  Tisseel was applied  to all these areas into the anterior portion anastomosis.  Peterson's defect  was closed with two sutures interrupted 2-0 Vicryl.  The stomach pouch had  been decompressed at this point.  Everything looked to be hemostatic and  intact.  I put a drain in the upper abdomen, bringing out through the left  side, sutured with 3-0 nylon.  Abdomen was then desufflated and the skin  wounds were closed with staples.  The patient seemed to tolerate the  procedure well and was taken to recovery room in satisfactory condition.      Thornton Park Daphine Deutscher, MD  Electronically Signed     MBM/MEDQ  D:  08/03/2005  T:  08/04/2005  Job:  [id number]

## 2010-07-17 NOTE — Discharge Summary (Signed)
[facility]  Patient:    Carly Delacruz, Carly Delacruz                    MRN: [id number] Adm. Date:  [id number] Disc. Date: [id number] Attending:  Pleas Koch Dictator:   Henreitta Leber, P.A.-C.                           Discharge Summary  DISCHARGE DIAGNOSES:          1. Pelvic pain.                               2. Endometriosis.                               3. Pelvic adhesions.                               4. Persistent postoperative fever.  OPERATION:                    On the day of admission, the patient underwent a total abdominal hysterectomy with a bilateral salpingo-oophorectomy with lysis of adhesions.  HISTORY OF PRESENT ILLNESS:   This is a 45 year old African-American female, gravida 0, with a history of hypertension, presenting with chronic pelvic pain secondary to endometriosis which has been unresponsive to medical therapy. The patient presents for total abdominal hysterectomy and left salpingo-oophorectomy with possible bilateral salpingo-oophorectomy.  Please see dictated history and physical examination for details.  PHYSICAL EXAMINATION:  VITAL SIGNS:                  Blood pressure 130/88.  GENERAL/ABDOMEN:              Within normal limits except her abdomen is obese with left lower quadrant tenderness, no guarding, rebound, or CVA tenderness.  PELVIC:                       External genitalia is within normal limits. Vagina is normal.  There is no discharge, no blood.  Cervix is nontender. Uterus is normal size, shape, and consistency with nonspecific tenderness. Left adnexa is tender but no appreciable masses.  Right adnexa is without tenderness.  Left adnexa is tender but no appreciable masses.  HOSPITAL COURSE:              On the day of admission, the patient underwent a total abdominal hysterectomy with bilateral salpingo-oophorectomy and lysis of adhesions.  The patient tolerated the procedure well.  The  postoperative course was marked by fever of undetermined origin, the maximum of which was 102.3 degrees Fahrenheit orally.  Results of postoperative chest x-ray, abdominal/pelvic CT scan, pelvic ultrasound, and IVP revealed no focal findings which may explain the patients persistent fever.  Urine and blood cultures were negative, and white count never exceeded 8.7.  Though patients bowel and bladder functions were fully operational day #3, due to her persistent fever, she received two courses of antibiotics and an infectious disease consult, after which she was deemed ready for discharge by postoperative day #8.  DISCHARGE MEDICATIONS:        1. Ibuprofen 600 mg 1 tablet every 6 hours as  needed.                               2. Tylox 1-2 tablets every 3-4 hours as needed.                               3. Climara patch 0.05 mg/d, replace weekly.  FOLLOW-UP:                    The patient is to follow up with Dr. Trudie Reed at [facility] on October 05, 1999, at scheduled time.  DISCHARGE INSTRUCTIONS:       The patient was given a copy of [facility] postoperative instruction sheet.  She was further advised to avoid driving for two weeks, heavy lifting for four weeks, intercourse for six weeks, and for the next three days, she was instructed to take her temperature twice daily and to phone [facility] for a temperature greater than 101.  FINAL PATHOLOGY:              The uterus, fallopian tubes, and ovaries: Benign endocervix, no ectocervix identified, benign proliferative endometrium, adenomyosis, leiomyomata uteri, uterine serosal endometriosis associated with fibrosis adhesions, fallopian tube segments with no pathologic abnormalities, ovarian endometriosis associated with fibrous adhesions. DD:  10/29/99 TD:  10/29/99 Job: [id number]

## 2010-07-17 NOTE — Op Note (Signed)
NAMESANJA, Carly Delacruz             ACCOUNT NO.:  [id number]   MEDICAL RECORD NO.:  [id number]          PATIENT TYPE:  AMB   LOCATION:  ENDO                         FACILITY:  [facility]   PHYSICIAN:  Anselmo Rod, M.D.  DATE OF BIRTH:  [birth year]   DATE OF PROCEDURE:  10/19/2004  DATE OF DISCHARGE:                                 OPERATIVE REPORT   PROCEDURE:  Colonoscopy to terminal ileum.   ENDOSCOPIST:  Charna Elizabeth, M.D.   INSTRUMENT USED:  Olympus video colonoscope.   INDICATIONS FOR PROCEDURE:  Blood in stool of a 45 year old [place]  female with a history of abdominal pain, rule out colonic polyps, masses,  etc.   PREPROCEDURE PREPARATION:  Informed consent was obtained from the patient.  The patient was fasted for eight hours prior to the procedure and prepped  with a bottle of magnesium citrate and a gallon of GoLYTELY the night prior  to the procedure.  The risks and benefits of the procedure including a 10%  missed rate of cancer and polyps were discussed with the patient, as well.   PREPROCEDURE PHYSICAL:  Patient with stable vital signs.  Neck supple.  Chest clear to auscultation.  S1 and S2 regular.  Abdomen soft with normal  bowel sounds.   DESCRIPTION OF PROCEDURE:  The patient was placed in the left lateral  decubitus position, sedated with 100 mg of Demerol and 10 mg Versed in slow  incremental doses.  Once the patient was adequately sedated, maintained on  low flow oxygen and continuous cardiac monitoring, the Olympus video  colonoscope was advanced from the rectum to the cecum with difficulty.  There was a significant amount of residual stool in the colon, multiple  washes were done.  The appendiceal orifice and ileocecal valve were clearly  visualized and photographed.  The terminal ileum appeared healthy and  without lesions.  No masses, polyps, erosions, ulcerations, or diverticula  were seen.  Retroflexion in the rectum revealed no  abnormalities.   IMPRESSION:  1.  Normal colonoscopy to the terminal ileum, no masses polyps, erosions,      ulcerations, or diverticulum were seen.  2.  Large amount of residual stool in the colon, multiple washes done, small      lesions could be missed.   RECOMMENDATIONS:  1.  Continue a high fiber diet with liberal fluid intake.  2.  Repeat colonoscopy in the next ten years unless the patient develops any      abnormal symptoms in the interim.  3.  Outpatient follow up as need arises in the future.      Anselmo Rod, M.D.  Electronically Signed     JNM/MEDQ  D:  10/19/2004  T:  10/19/2004  Job:  [id number]   cc:   Merlene Laughter. Renae Gloss, M.D.  [address]  [address]  [place]  [place] [address]  Fax: [phone]

## 2010-07-17 NOTE — Discharge Summary (Signed)
NAMESHANAH, Carly Delacruz             ACCOUNT NO.:  [id number]   MEDICAL RECORD NO.:  [id number]          PATIENT TYPE:  INP   LOCATION:  1605                         FACILITY:  [facility]   PHYSICIAN:  Thornton Park. Daphine Deutscher, MD  DATE OF BIRTH:  22-Jun-1965   DATE OF ADMISSION:  08/03/2005  DATE OF DISCHARGE:  08/08/2005                                 DISCHARGE SUMMARY   ADMITTING DIAGNOSIS:  Morbid obesity.   PROCEDURE:  Laparoscopic Roux-en-Y gastric bypass August 03, 2005.   COURSE IN THE HOSPITAL:  Carly Delacruz is a 45 year old lady who underwent  the above-mentioned operation.  Postoperatively she had some difficulty with  an IV access and required a PICC line.  Bilateral venous Dopplers were  normal and she was then taken for an upper GI which showed her pouch to  empty.  She was transferred upstairs.  She had a drain in place.  She had  some serosanguineous strange which was able to be removed prior to  discharge.  That was removed on postoperative day #4 and she was drinking  liquids and had a BM at that point.  She was ready for discharge on August 08, 2005 having had a low-grade fever which seemed to be clearing but she seemed  to be recovering well.  Her ileus had cleared and her staples were out.  She  was sent home.   FINAL DIAGNOSIS:  Morbid obesity, BMI approximately 52, status post  laparoscopic gastric bypass.      Thornton Park Daphine Deutscher, MD  Electronically Signed     MBM/MEDQ  D:  08/30/2005  T:  08/30/2005  Job:  [id number]

## 2010-07-17 NOTE — Procedures (Signed)
Carly Delacruz, Carly Delacruz             ACCOUNT NO.:  [id number]   MEDICAL RECORD NO.:  [id number]          PATIENT TYPE:  OUT   LOCATION:  SLEEP CENTER                 FACILITY:  [facility]   PHYSICIAN:  Clinton D. Maple Hudson, M.D. DATE OF BIRTH:  [birth year]   DATE OF STUDY:  12/01/2003  DATE OF DISCHARGE:  12/01/2003                              NOCTURNAL POLYSOMNOGRAM   REFERRING PHYSICIAN:  Dr. Armanda Magic   INDICATION FOR STUDY:  Insomnia with sleep apnea.  Neck size 16.5 inches.  Epworth sleepiness score 12/24.  BMI 53.  Weight 315 pounds.   SLEEP ARCHITECTURE:  Total sleep time 395 minutes with sleep efficiency 85%.  Stage I was 9%, stage II 76%, stages III and IV were absent.  REM was 15% of  total sleep time.  Sleep latency was 15 minutes.  REM latency 123 minutes.  Awake after sleep onset 53 minutes.  Arousal index 39 which is increased.  Many of these events appeared spontaneous.  She slept propped up on three  pillows because of complaint of abrupt nocturnal coughing episodes.  No bed  time medications were taken.   RESPIRATORY DATA:  NPSG protocol.  RDI 21.9/hour reflecting 2 central  apneas, 6 obstructive apneas, 136 hypopneas.  Events were not positional as  she did sleep supine, prone, and on right side with head elevated.  REM RDI  was 43.   OXYGEN DATA:  Very loud snoring with oxygen desaturation to a nadir of 84%  during apneas.  Mean oxygen saturation through the study was 93-95% on room  air.   CARDIAC DATA:  Normal sinus rhythm.   MOVEMENT/PARASOMNIA:  Occasional leg jerk with no significant impact on  sleep quality.   IMPRESSION/RECOMMENDATION:  Moderate obstructive sleep apnea/hypopnea  syndrome, RDI 21.9/hour with desaturation to 84%.  Additional nonspecific  arousals.  Consider return for CPAP titration or evaluation for alternative  therapies as appropriate.  Recognize that a history of abrupt waking from  sleep with cough often indicates laryngopharyngeal  reflux.      CDY/MEDQ  D:  12/08/2003 11:41:43  T:  12/09/2003 11:29:05  Job:  [id number]

## 2010-07-17 NOTE — Op Note (Signed)
[facility]  Patient:    Carly Delacruz, Carly Delacruz                      MRN: [id number] Proc. Date: 09/18/99 Attending:  Georgina Peer, M.D.                           Operative Report  PREOPERATIVE DIAGNOSIS:  Chronic pelvic pain, endometriosis, history of pelvic adhesions.  POSTOPERATIVE DIAGNOSIS:  Severe pelvic adhesions involving uterus, ovaries, large and small intestines and rectosigmoid.  OPERATION PERFORMED:  Total abdominal hysterectomy, bilateral salpingo-oophorectomy, extensive lysis of adhesions, placement of Alpha 200 pain management catheter.  SURGEON:  Georgina Peer, M.D.  ASSISTANT:  Henreitta Leber, PA-C.  ANESTHESIA:  ESTIMATED BLOOD LOSS:  600 cc.  COMPLICATIONS:  None.  FINDINGS:  Severe adhesions involving tubes and ovaries to large and small intestine, posterior uterus, rectosigmoid to the posterior uterus.  INDICATIONS:  The patient is a 45 year old African-American female with chronic pelvic pain going back to 1987.  She had been treated with Depo-Lupron but it no longer relieves the pain and the patient has elected aggressive surgical management.  For details, see history and physical which was previously dictated.  Preoperative hemoglobin 13.2, preoperative potassium 3.8.  DESCRIPTION OF PROCEDURE:  The patient was taken to the operating room and prepped and draped in normal sterile fashion.  Foley catheter placed in the bladder.  Transverse skin incision was made through the previous incision and taken down into the abdominal cavity in the normal Pfannenstiel manner with bleeders cauterized.  There were omental adhesions to the anterior abdominal wall which were carefully lysed on entering the abdomen.  The ovaries were adherent to the large and small intestine and the right ovary adherent densely to the posterior uterine wall and rectosigmoid.  Careful dissection freed the small and large intestine from the pelvic  organs.  It was thought that because of extensive adhesions of the right and left ovaries, the chance of readhesion formation being exceptionally high, both tubes and ovaries as well as the uterus and cervix would need to be removed.  This had been previously discussed with the patient and accepted if the operative findings warranted that decision.  Therefore, the right ovary was freed from its attachments. The infundibulopelvic ligament was isolated, clamped, ligated doubly and cut. The utero-ovarian ligament was clamped, cut and suture ligated and the ovary was removed.  The tube was adherent to the ovary and the uterus and it was removed as a separate specimen with progressive clamping, cutting and suture ligating. The left ovary was freed from the pelvic sidewall and posterior uterus as well as the left tube.  The rectosigmoid was dissected free from the posterior uterine wall by careful sharp and blunt dissection.  There was minimal bleeding.  Two long Kelly clamps were placed across the cornua.  The round ligaments were clamped, cut and suture ligated.  The utero-ovarian ligaments were clamped, cut and suture ligated.  The infundibulopelvic ligament on the left was clamped, cut and suture ligated.  The uterine vessels were doubly clamped, cut and suture ligated.  The cardinal vessels were clamped, cut and suture ligated.  Bladder flap was created prior to the uterine artery clamping and the bladder pushed down off the anterior uterine segment.  At the cervicouterine junction, the cervix was amputated from the vagina and the vaginal apex identified.  Corner sutures of Vicryl were placed. The  cuff was whipstitched and then closed with a figure-of-eight suture of 0 Vicryl.  There was excellent hemostasis.  The pelvis was irrigated.  The pedicles inspected and found to be hemostatic.  Retractors and packs were removed which had been placed for exposure.  The bowel packing was removed. The  fascia was closed in three sections of Vicryl.  All bleeders under the fascia were inspected and found to be hemostatic.  20 cc of 0.25% Marcaine was injected in the subcutaneous tissue.  An Alpha 200 catheter was placed through a stab wound three fingerbreadths above and to the left edge of the incision and secured in place with Tegaderm.  The subcutaneous tissues were then closed in interrupted sutures and skin closed with skin staples.  The urine was clear and yellow at the end of the case.  The sponge and needle counts were correct. The patient did receive Ancef during the operation 1 gm. DD:  09/18/99 TD:  09/22/99 Job: [id number]

## 2010-08-05 ENCOUNTER — Other Ambulatory Visit: Payer: Self-pay | Admitting: Obstetrics and Gynecology

## 2010-08-05 DIAGNOSIS — N63 Unspecified lump in unspecified breast: Secondary | ICD-10-CM

## 2010-08-11 ENCOUNTER — Ambulatory Visit
Admission: RE | Admit: 2010-08-11 | Discharge: 2010-08-11 | Disposition: A | Discharge status: Home / Self Care | Payer: Commercial Managed Care - PPO | Source: Ambulatory Visit | Attending: Obstetrics and Gynecology | Admitting: Obstetrics and Gynecology

## 2010-08-11 ENCOUNTER — Other Ambulatory Visit: Payer: Self-pay | Admitting: Obstetrics and Gynecology

## 2010-08-11 DIAGNOSIS — N63 Unspecified lump in unspecified breast: Secondary | ICD-10-CM

## 2010-09-10 ENCOUNTER — Inpatient Hospital Stay (INDEPENDENT_AMBULATORY_CARE_PROVIDER_SITE_OTHER)
Admission: RE | Admit: 2010-09-10 | Discharge: 2010-09-10 | Disposition: A | Discharge status: Home / Self Care | Payer: 59 | Source: Ambulatory Visit | Attending: Family Medicine | Admitting: Family Medicine

## 2010-09-10 DIAGNOSIS — H8309 Labyrinthitis, unspecified ear: Secondary | ICD-10-CM

## 2010-10-21 ENCOUNTER — Inpatient Hospital Stay (INDEPENDENT_AMBULATORY_CARE_PROVIDER_SITE_OTHER)
Admission: RE | Admit: 2010-10-21 | Discharge: 2010-10-21 | Disposition: A | Discharge status: Home / Self Care | Payer: 59 | Source: Ambulatory Visit | Attending: Family Medicine | Admitting: Family Medicine

## 2010-10-21 DIAGNOSIS — R509 Fever, unspecified: Secondary | ICD-10-CM

## 2010-10-21 DIAGNOSIS — J069 Acute upper respiratory infection, unspecified: Secondary | ICD-10-CM

## 2010-10-22 ENCOUNTER — Emergency Department (HOSPITAL_COMMUNITY): Payer: 59

## 2010-10-22 ENCOUNTER — Inpatient Hospital Stay (INDEPENDENT_AMBULATORY_CARE_PROVIDER_SITE_OTHER)
Admission: RE | Admit: 2010-10-22 | Discharge: 2010-10-22 | Disposition: A | Discharge status: Home / Self Care | Payer: 59 | Source: Ambulatory Visit | Attending: Family Medicine | Admitting: Family Medicine

## 2010-10-22 ENCOUNTER — Emergency Department (HOSPITAL_COMMUNITY)
Admission: EM | Admit: 2010-10-22 | Discharge: 2010-10-22 | Disposition: A | Discharge status: Home / Self Care | Payer: 59 | Attending: Emergency Medicine | Admitting: Emergency Medicine

## 2010-10-22 DIAGNOSIS — R0982 Postnasal drip: Secondary | ICD-10-CM | POA: Insufficient documentation

## 2010-10-22 DIAGNOSIS — M129 Arthropathy, unspecified: Secondary | ICD-10-CM | POA: Insufficient documentation

## 2010-10-22 DIAGNOSIS — R509 Fever, unspecified: Secondary | ICD-10-CM

## 2010-10-22 DIAGNOSIS — R51 Headache: Secondary | ICD-10-CM | POA: Insufficient documentation

## 2010-10-22 DIAGNOSIS — R6889 Other general symptoms and signs: Secondary | ICD-10-CM

## 2010-10-22 DIAGNOSIS — R6883 Chills (without fever): Secondary | ICD-10-CM | POA: Insufficient documentation

## 2010-10-22 DIAGNOSIS — R61 Generalized hyperhidrosis: Secondary | ICD-10-CM | POA: Insufficient documentation

## 2010-10-22 DIAGNOSIS — R0609 Other forms of dyspnea: Secondary | ICD-10-CM | POA: Insufficient documentation

## 2010-10-22 DIAGNOSIS — M542 Cervicalgia: Secondary | ICD-10-CM | POA: Insufficient documentation

## 2010-10-22 DIAGNOSIS — R0989 Other specified symptoms and signs involving the circulatory and respiratory systems: Secondary | ICD-10-CM | POA: Insufficient documentation

## 2010-10-22 DIAGNOSIS — R059 Cough, unspecified: Secondary | ICD-10-CM | POA: Insufficient documentation

## 2010-10-22 DIAGNOSIS — R0602 Shortness of breath: Secondary | ICD-10-CM | POA: Insufficient documentation

## 2010-10-22 DIAGNOSIS — R05 Cough: Secondary | ICD-10-CM | POA: Insufficient documentation

## 2010-10-22 DIAGNOSIS — I10 Essential (primary) hypertension: Secondary | ICD-10-CM | POA: Insufficient documentation

## 2010-10-22 DIAGNOSIS — J329 Chronic sinusitis, unspecified: Secondary | ICD-10-CM | POA: Insufficient documentation

## 2010-12-16 LAB — URINALYSIS, ROUTINE W REFLEX MICROSCOPIC
Nitrite: NEGATIVE
Protein, ur: NEGATIVE
Specific Gravity, Urine: 1.027
Urobilinogen, UA: 0.2

## 2010-12-16 LAB — BASIC METABOLIC PANEL
CO2: 27
Chloride: 106
GFR calc Af Amer: >60
Potassium: 4.3
Sodium: 142

## 2010-12-16 LAB — CBC
HCT: 40.1
Hemoglobin: 13.3
MCHC: 33.1
MCV: 83.8
RBC: 4.78

## 2010-12-16 LAB — URINE CULTURE

## 2010-12-16 LAB — POCT CARDIAC MARKERS
CKMB, poc: <1 — ABNORMAL LOW
Myoglobin, poc: 40.6
Troponin i, poc: <0.05

## 2010-12-16 LAB — PREGNANCY, URINE: Preg Test, Ur: NEGATIVE

## 2010-12-17 ENCOUNTER — Other Ambulatory Visit (HOSPITAL_COMMUNITY): Payer: Self-pay | Admitting: Obstetrics and Gynecology

## 2010-12-17 DIAGNOSIS — Z1231 Encounter for screening mammogram for malignant neoplasm of breast: Secondary | ICD-10-CM

## 2011-01-15 ENCOUNTER — Ambulatory Visit (HOSPITAL_COMMUNITY): Payer: 59

## 2011-02-11 ENCOUNTER — Ambulatory Visit (HOSPITAL_COMMUNITY)
Admission: RE | Admit: 2011-02-11 | Discharge: 2011-02-11 | Disposition: A | Discharge status: Home / Self Care | Payer: 59 | Source: Ambulatory Visit | Attending: Obstetrics and Gynecology | Admitting: Obstetrics and Gynecology

## 2011-02-11 DIAGNOSIS — Z1231 Encounter for screening mammogram for malignant neoplasm of breast: Secondary | ICD-10-CM | POA: Insufficient documentation

## 2011-08-26 ENCOUNTER — Ambulatory Visit: Payer: Self-pay | Admitting: Obstetrics and Gynecology

## 2011-12-16 ENCOUNTER — Ambulatory Visit: Payer: 59 | Attending: Family Medicine

## 2011-12-16 DIAGNOSIS — R5381 Other malaise: Secondary | ICD-10-CM | POA: Insufficient documentation

## 2011-12-16 DIAGNOSIS — IMO0001 Reserved for inherently not codable concepts without codable children: Secondary | ICD-10-CM | POA: Insufficient documentation

## 2011-12-16 DIAGNOSIS — M25569 Pain in unspecified knee: Secondary | ICD-10-CM | POA: Insufficient documentation

## 2011-12-16 DIAGNOSIS — M545 Low back pain, unspecified: Secondary | ICD-10-CM | POA: Insufficient documentation

## 2011-12-20 ENCOUNTER — Ambulatory Visit: Payer: 59 | Admitting: Physical Therapy

## 2011-12-22 ENCOUNTER — Ambulatory Visit: Payer: 59

## 2011-12-27 ENCOUNTER — Encounter: Payer: 59 | Admitting: Physical Therapy

## 2011-12-28 ENCOUNTER — Ambulatory Visit: Payer: 59

## 2011-12-28 ENCOUNTER — Ambulatory Visit: Payer: 59 | Admitting: Rehabilitative and Restorative Service Providers"

## 2011-12-30 ENCOUNTER — Ambulatory Visit: Payer: 59 | Admitting: Physical Therapy

## 2012-01-03 ENCOUNTER — Ambulatory Visit: Payer: 59 | Attending: Family Medicine | Admitting: Physical Therapy

## 2012-01-03 DIAGNOSIS — R5381 Other malaise: Secondary | ICD-10-CM | POA: Insufficient documentation

## 2012-01-03 DIAGNOSIS — M545 Low back pain, unspecified: Secondary | ICD-10-CM | POA: Insufficient documentation

## 2012-01-03 DIAGNOSIS — IMO0001 Reserved for inherently not codable concepts without codable children: Secondary | ICD-10-CM | POA: Insufficient documentation

## 2012-01-03 DIAGNOSIS — M25569 Pain in unspecified knee: Secondary | ICD-10-CM | POA: Insufficient documentation

## 2012-01-05 ENCOUNTER — Ambulatory Visit: Payer: 59 | Admitting: Physical Therapy

## 2012-01-13 ENCOUNTER — Ambulatory Visit: Payer: 59

## 2012-01-18 ENCOUNTER — Ambulatory Visit: Payer: 59

## 2012-01-20 ENCOUNTER — Ambulatory Visit: Payer: 59 | Admitting: Physical Therapy

## 2012-02-08 ENCOUNTER — Other Ambulatory Visit (HOSPITAL_COMMUNITY): Payer: Self-pay | Admitting: Family Medicine

## 2012-02-08 DIAGNOSIS — M25562 Pain in left knee: Secondary | ICD-10-CM

## 2012-02-09 ENCOUNTER — Ambulatory Visit (HOSPITAL_COMMUNITY)
Admission: RE | Admit: 2012-02-09 | Discharge: 2012-02-09 | Disposition: A | Discharge status: Home / Self Care | Payer: 59 | Source: Ambulatory Visit | Attending: Family Medicine | Admitting: Family Medicine

## 2012-02-09 DIAGNOSIS — M25562 Pain in left knee: Secondary | ICD-10-CM

## 2012-02-09 DIAGNOSIS — M1710 Unilateral primary osteoarthritis, unspecified knee: Secondary | ICD-10-CM | POA: Insufficient documentation

## 2012-02-09 DIAGNOSIS — M25569 Pain in unspecified knee: Secondary | ICD-10-CM | POA: Insufficient documentation

## 2012-02-10 ENCOUNTER — Other Ambulatory Visit: Payer: Self-pay | Admitting: Obstetrics and Gynecology

## 2012-02-10 DIAGNOSIS — Z1231 Encounter for screening mammogram for malignant neoplasm of breast: Secondary | ICD-10-CM

## 2012-02-14 ENCOUNTER — Ambulatory Visit (HOSPITAL_COMMUNITY): Payer: 59

## 2012-03-06 ENCOUNTER — Ambulatory Visit (HOSPITAL_COMMUNITY)
Admission: RE | Admit: 2012-03-06 | Discharge: 2012-03-06 | Disposition: A | Discharge status: Home / Self Care | Payer: 59 | Source: Ambulatory Visit | Attending: Obstetrics and Gynecology | Admitting: Obstetrics and Gynecology

## 2012-03-06 DIAGNOSIS — Z1231 Encounter for screening mammogram for malignant neoplasm of breast: Secondary | ICD-10-CM | POA: Insufficient documentation

## 2012-03-29 ENCOUNTER — Other Ambulatory Visit: Payer: 59

## 2012-03-29 ENCOUNTER — Telehealth: Payer: Self-pay | Admitting: Obstetrics and Gynecology

## 2012-03-29 ENCOUNTER — Encounter: Payer: Self-pay | Admitting: Obstetrics and Gynecology

## 2012-03-29 ENCOUNTER — Telehealth (INDEPENDENT_AMBULATORY_CARE_PROVIDER_SITE_OTHER): Payer: Self-pay

## 2012-03-29 MED ORDER — KETOROLAC TROMETHAMINE 30 MG/ML IJ SOLN
30.0000 mg | Freq: Once | INTRAMUSCULAR | Status: AC
  Administered 2012-03-29: 30 mg via INTRAVENOUS

## 2012-03-29 NOTE — Telephone Encounter (Signed)
Spoke with pt regarding Toradol injection per Dr. Normand Sloop. Pt will be coming to the office for Toradol injection, 30 mg IM. Mathis Bud

## 2012-03-29 NOTE — Progress Notes (Signed)
Patient ID: Carly Delacruz, female   DOB: 1965/12/31, 47 y.o.   MRN: [id number] Pt c/o back pain.  She has herniated discs and back pain.  She has tried steroid injections it has given some relief.  She is unable to ingest NSAIDS.  seh spoke with her PCP and he stated she could have toradol.  toradol 30 mg IM times one given to pt

## 2012-03-29 NOTE — Telephone Encounter (Signed)
Patient called stating she has back pain and her doctor managing it wanted to know if patient could have toradol injection since she is a bypass patient. States she has been getting different types of injections for her back pain. Not seen in a few years by anyone here.

## 2012-03-29 NOTE — Progress Notes (Signed)
Toradol 30mg  im per ND given.  ld

## 2012-04-26 ENCOUNTER — Encounter: Payer: Self-pay | Admitting: Obstetrics and Gynecology

## 2012-04-26 ENCOUNTER — Ambulatory Visit: Payer: 59 | Admitting: Obstetrics and Gynecology

## 2012-04-26 VITALS — BP 132/96

## 2012-04-26 DIAGNOSIS — M549 Dorsalgia, unspecified: Secondary | ICD-10-CM

## 2012-04-26 MED ORDER — KETOROLAC TROMETHAMINE 30 MG/ML IJ SOLN
30.0000 mg | Freq: Once | INTRAMUSCULAR | Status: AC
  Administered 2012-04-26: 30 mg via INTRAMUSCULAR

## 2012-04-26 NOTE — Patient Instructions (Signed)
Back Pain, Adult Low back pain is very common. About 1 in 5 people have back pain.The cause of low back pain is rarely dangerous. The pain often gets better over time.About half of people with a sudden onset of back pain feel better in just 2 weeks. About 8 in 10 people feel better by 6 weeks.  CAUSES Some common causes of back pain include:  Strain of the muscles or ligaments supporting the spine.  Wear and tear (degeneration) of the spinal discs.  Arthritis.  Direct injury to the back. DIAGNOSIS Most of the time, the direct cause of low back pain is not known.However, back pain can be treated effectively even when the exact cause of the pain is unknown.Answering your caregiver's questions about your overall health and symptoms is one of the most accurate ways to make sure the cause of your pain is not dangerous. If your caregiver needs more information, he or she may order lab work or imaging tests (X-rays or MRIs).However, even if imaging tests show changes in your back, this usually does not require surgery. HOME CARE INSTRUCTIONS For many people, back pain returns.Since low back pain is rarely dangerous, it is often a condition that people can learn to manageon their own.   Remain active. It is stressful on the back to sit or stand in one place. Do not sit, drive, or stand in one place for more than 30 minutes at a time. Take short walks on level surfaces as soon as pain allows.Try to increase the length of time you walk each day.  Do not stay in bed.Resting more than 1 or 2 days can delay your recovery.  Do not avoid exercise or work.Your body is made to move.It is not dangerous to be active, even though your back may hurt.Your back will likely heal faster if you return to being active before your pain is gone.  Pay attention to your body when you bend and lift. Many people have less discomfortwhen lifting if they bend their knees, keep the load close to their bodies,and  avoid twisting. Often, the most comfortable positions are those that put less stress on your recovering back.  Find a comfortable position to sleep. Use a firm mattress and lie on your side with your knees slightly bent. If you lie on your back, put a pillow under your knees.  Only take over-the-counter or prescription medicines as directed by your caregiver. Over-the-counter medicines to reduce pain and inflammation are often the most helpful.Your caregiver may prescribe muscle relaxant drugs.These medicines help dull your pain so you can more quickly return to your normal activities and healthy exercise.  Put ice on the injured area.  Put ice in a plastic bag.  Place a towel between your skin and the bag.  Leave the ice on for 15 to 20 minutes, 3 to 4 times a day for the first 2 to 3 days. After that, ice and heat may be alternated to reduce pain and spasms.  Ask your caregiver about trying back exercises and gentle massage. This may be of some benefit.  Avoid feeling anxious or stressed.Stress increases muscle tension and can worsen back pain.It is important to recognize when you are anxious or stressed and learn ways to manage it.Exercise is a great option. SEEK MEDICAL CARE IF:  You have pain that is not relieved with rest or medicine.  You have pain that does not improve in 1 week.  You have new symptoms.  You are generally   not feeling well. SEEK IMMEDIATE MEDICAL CARE IF:   You have pain that radiates from your back into your legs.  You develop new bowel or bladder control problems.  You have unusual weakness or numbness in your arms or legs.  You develop nausea or vomiting.  You develop abdominal pain.  You feel faint. Document Released: 02/15/2005 Document Revised: 08/17/2011 Document Reviewed: 07/06/2010 Exit[employer] Patient Information 2013 [employer].  

## 2012-04-26 NOTE — Progress Notes (Signed)
Pt declines weight check, c/o severe back pain, 8.8/10 pain scale .  She has had pain on and off for years.  She has been seen by the pain clinic and Orthopaedics. She has had steroid injections.  The only thing that works is toradol BP 132/96 Physical Examination: General appearance - alert, well appearing, and in no distress Lymphatics - no palpable lymphadenopathy Heart - normal rate and regular rhythm Back exam - full range of motion, no tenderness, palpable spasm or pain on motion, lower righ quadrant is sore.  No CVAT B Chronic back pain toradol for pain F/u with orthopaedics for a long term plan

## 2012-08-15 LAB — HM PAP SMEAR: HM Pap smear: NEGATIVE

## 2013-01-16 ENCOUNTER — Ambulatory Visit: Payer: 59 | Admitting: Dietician

## 2013-01-17 ENCOUNTER — Other Ambulatory Visit (HOSPITAL_COMMUNITY): Payer: Self-pay | Admitting: Internal Medicine

## 2013-01-17 DIAGNOSIS — M8580 Other specified disorders of bone density and structure, unspecified site: Secondary | ICD-10-CM

## 2013-01-17 DIAGNOSIS — M899 Disorder of bone, unspecified: Secondary | ICD-10-CM

## 2013-01-19 ENCOUNTER — Ambulatory Visit (HOSPITAL_COMMUNITY): Payer: 59

## 2013-01-22 ENCOUNTER — Ambulatory Visit (HOSPITAL_COMMUNITY): Admission: RE | Admit: 2013-01-22 | Payer: 59 | Source: Ambulatory Visit

## 2013-01-25 ENCOUNTER — Ambulatory Visit (HOSPITAL_COMMUNITY)
Admission: RE | Admit: 2013-01-25 | Discharge: 2013-01-25 | Disposition: A | Discharge status: Home / Self Care | Payer: 59 | Source: Ambulatory Visit | Attending: Internal Medicine | Admitting: Internal Medicine

## 2013-01-25 DIAGNOSIS — M899 Disorder of bone, unspecified: Secondary | ICD-10-CM | POA: Insufficient documentation

## 2013-01-25 DIAGNOSIS — Z13820 Encounter for screening for osteoporosis: Secondary | ICD-10-CM | POA: Insufficient documentation

## 2013-01-25 DIAGNOSIS — M8580 Other specified disorders of bone density and structure, unspecified site: Secondary | ICD-10-CM

## 2013-02-07 ENCOUNTER — Ambulatory Visit: Payer: 59 | Admitting: Dietician

## 2013-02-14 ENCOUNTER — Other Ambulatory Visit (HOSPITAL_COMMUNITY): Payer: Self-pay | Admitting: *Deleted

## 2013-02-14 ENCOUNTER — Other Ambulatory Visit (HOSPITAL_COMMUNITY): Payer: Self-pay | Admitting: Nurse Practitioner

## 2013-02-14 ENCOUNTER — Ambulatory Visit (HOSPITAL_COMMUNITY)
Admission: RE | Admit: 2013-02-14 | Discharge: 2013-02-14 | Disposition: A | Discharge status: Home / Self Care | Payer: 59 | Source: Ambulatory Visit | Attending: Nurse Practitioner | Admitting: Nurse Practitioner

## 2013-02-14 DIAGNOSIS — R0602 Shortness of breath: Secondary | ICD-10-CM | POA: Insufficient documentation

## 2013-02-14 DIAGNOSIS — R05 Cough: Secondary | ICD-10-CM

## 2013-02-14 DIAGNOSIS — R059 Cough, unspecified: Secondary | ICD-10-CM

## 2013-02-14 DIAGNOSIS — J209 Acute bronchitis, unspecified: Secondary | ICD-10-CM | POA: Insufficient documentation

## 2013-03-13 ENCOUNTER — Other Ambulatory Visit: Payer: Self-pay | Admitting: Obstetrics and Gynecology

## 2013-03-13 DIAGNOSIS — Z1231 Encounter for screening mammogram for malignant neoplasm of breast: Secondary | ICD-10-CM

## 2013-03-22 ENCOUNTER — Ambulatory Visit (HOSPITAL_COMMUNITY)
Admission: RE | Admit: 2013-03-22 | Discharge: 2013-03-22 | Disposition: A | Discharge status: Home / Self Care | Payer: 59 | Source: Ambulatory Visit | Attending: Obstetrics and Gynecology | Admitting: Obstetrics and Gynecology

## 2013-03-22 DIAGNOSIS — Z1231 Encounter for screening mammogram for malignant neoplasm of breast: Secondary | ICD-10-CM | POA: Insufficient documentation

## 2013-04-23 ENCOUNTER — Encounter: Payer: Self-pay | Admitting: Internal Medicine

## 2013-05-01 ENCOUNTER — Ambulatory Visit (HOSPITAL_COMMUNITY)
Admission: RE | Admit: 2013-05-01 | Discharge: 2013-05-01 | Disposition: A | Discharge status: Home / Self Care | Payer: 59 | Source: Ambulatory Visit | Attending: Internal Medicine | Admitting: Internal Medicine

## 2013-05-01 ENCOUNTER — Other Ambulatory Visit (HOSPITAL_COMMUNITY): Payer: Self-pay | Admitting: Internal Medicine

## 2013-05-01 DIAGNOSIS — R109 Unspecified abdominal pain: Secondary | ICD-10-CM | POA: Insufficient documentation

## 2013-05-01 DIAGNOSIS — R1930 Abdominal rigidity, unspecified site: Secondary | ICD-10-CM

## 2013-05-01 DIAGNOSIS — Z9089 Acquired absence of other organs: Secondary | ICD-10-CM | POA: Insufficient documentation

## 2013-05-01 DIAGNOSIS — Z90710 Acquired absence of both cervix and uterus: Secondary | ICD-10-CM | POA: Insufficient documentation

## 2013-05-01 DIAGNOSIS — R1032 Left lower quadrant pain: Secondary | ICD-10-CM

## 2013-05-01 DIAGNOSIS — Z980 Intestinal bypass and anastomosis status: Secondary | ICD-10-CM | POA: Insufficient documentation

## 2013-05-01 MED ORDER — IOHEXOL 300 MG/ML  SOLN
100.0000 mL | Freq: Once | INTRAMUSCULAR | Status: AC | PRN
  Administered 2013-05-01: 100 mL via INTRAVENOUS

## 2013-05-02 ENCOUNTER — Ambulatory Visit (HOSPITAL_COMMUNITY): Payer: 59

## 2013-05-07 ENCOUNTER — Emergency Department (HOSPITAL_COMMUNITY)
Admission: EM | Admit: 2013-05-07 | Discharge: 2013-05-07 | Disposition: A | Discharge status: Home / Self Care | Payer: 59 | Attending: Emergency Medicine | Admitting: Emergency Medicine

## 2013-05-07 ENCOUNTER — Encounter (HOSPITAL_COMMUNITY): Payer: Self-pay | Admitting: Emergency Medicine

## 2013-05-07 ENCOUNTER — Encounter: Payer: Self-pay | Admitting: Internal Medicine

## 2013-05-07 DIAGNOSIS — R109 Unspecified abdominal pain: Secondary | ICD-10-CM | POA: Insufficient documentation

## 2013-05-07 DIAGNOSIS — Z9884 Bariatric surgery status: Secondary | ICD-10-CM | POA: Insufficient documentation

## 2013-05-07 DIAGNOSIS — D649 Anemia, unspecified: Secondary | ICD-10-CM | POA: Insufficient documentation

## 2013-05-07 DIAGNOSIS — M129 Arthropathy, unspecified: Secondary | ICD-10-CM | POA: Insufficient documentation

## 2013-05-07 DIAGNOSIS — Z79899 Other long term (current) drug therapy: Secondary | ICD-10-CM | POA: Insufficient documentation

## 2013-05-07 DIAGNOSIS — Z8742 Personal history of other diseases of the female genital tract: Secondary | ICD-10-CM | POA: Insufficient documentation

## 2013-05-07 DIAGNOSIS — Z90710 Acquired absence of both cervix and uterus: Secondary | ICD-10-CM | POA: Insufficient documentation

## 2013-05-07 DIAGNOSIS — Z87440 Personal history of urinary (tract) infections: Secondary | ICD-10-CM | POA: Insufficient documentation

## 2013-05-07 DIAGNOSIS — Z9089 Acquired absence of other organs: Secondary | ICD-10-CM | POA: Insufficient documentation

## 2013-05-07 DIAGNOSIS — K219 Gastro-esophageal reflux disease without esophagitis: Secondary | ICD-10-CM | POA: Insufficient documentation

## 2013-05-07 DIAGNOSIS — I10 Essential (primary) hypertension: Secondary | ICD-10-CM | POA: Insufficient documentation

## 2013-05-07 DIAGNOSIS — Z8619 Personal history of other infectious and parasitic diseases: Secondary | ICD-10-CM | POA: Insufficient documentation

## 2013-05-07 MED ORDER — KETOROLAC TROMETHAMINE 60 MG/2ML IM SOLN
60.0000 mg | Freq: Once | INTRAMUSCULAR | Status: AC
  Administered 2013-05-07: 60 mg via INTRAMUSCULAR
  Filled 2013-05-07: qty 2

## 2013-05-07 MED ORDER — HYDROCODONE-ACETAMINOPHEN 5-325 MG PO TABS: 1.0000 | ORAL_TABLET | ORAL | Status: DC | PRN

## 2013-05-07 NOTE — ED Provider Notes (Signed)
CSN: [id number]     Arrival date & time 05/07/13  4098 History   First MD Initiated Contact with Patient 05/07/13 0913     Chief Complaint  Patient presents with  . Abdominal Pain    Onset a week ago, seen PCP last week     (Consider location/radiation/quality/duration/timing/severity/associated sxs/prior Treatment) The history is provided by the patient.   Carly Delacruz is a 48 y.o. female who is here for evaluation of abdominal pain. She reports constant left lower abdominal pain for one week, day and night. She has not taken anything for the pain. During this time, she has had a CT scan of the abdomen that did not show any acute abnormalities. She was referred to GI, and has an appointment, for tomorrow. She states that she could not wait because of the pain   Past Medical History  Diagnosis Date  . Fatigue 2012  . Malaise 2012  . Insomnia   . Endometriosis   . Sleep apnea 2005  . Morbid obesity 2005  . Yeast infection   . Trichomonas   . Complication of anesthesia   . H/O varicella   . H/O mumps   . H/O bladder infections   . Insomnia secondary to situational depression 06/06/07  . Atrophic vaginitis 2009  . BV (bacterial vaginosis) 2006  . Liver mass, right lobe 08/13/04    Nodule  . Arthritis 12/10/03  . SUI (stress urinary incontinence, female) 08/12/03  . History of candidal vulvovaginitis 03/21/03  . Vulvar lesion 03/21/03   Past Surgical History  Procedure Laterality Date  . Wisdom tooth extraction    . Appendectomy    . Tonsillectomy    . Cholecystectomy    . Abdominal hysterectomy    . Gastric bypass    . Laparotomy    . Dilation and curettage of uterus     History reviewed. No pertinent family history. History  Substance Use Topics  . Smoking status: Never Smoker   . Smokeless tobacco: Not on file  . Alcohol Use: No   OB History   Grav Para Term Preterm Abortions TAB SAB Ect Mult Living   0 0             Review of Systems  All other systems  reviewed and are negative.      Allergies  Morphine and related; Septra; Codeine; Darvocet; Dilaudid; and Ultram  Home Medications   Current Outpatient Rx  Name  Route  Sig  Dispense  Refill  . buPROPion (WELLBUTRIN XL) 150 MG 24 hr tablet   Oral   Take 150 mg by mouth daily.         Marland Kitchen CALCIUM PO   Oral   Take 1 tablet by mouth daily.          . Cyanocobalamin (VITAMIN B-12 PO)   Oral   Take 1 tablet by mouth daily.          . diphenhydrAMINE (BENADRYL) 25 MG tablet   Oral   Take 25 mg by mouth every 6 (six) hours as needed for allergies.         . hydrochlorothiazide (HYDRODIURIL) 50 MG tablet   Oral   Take 50 mg by mouth daily.         Marland Kitchen HYDROcodone-acetaminophen (NORCO/VICODIN) 5-325 MG per tablet   Oral   Take 1 tablet by mouth every 6 (six) hours as needed for pain.         . IRON PO  Oral   Take 1 tablet by mouth daily.          . Menthol, Topical Analgesic, (BIOFREEZE ROLL-ON COLORLESS EX)   Apply externally   Apply 1 application topically as needed (for pain).         . Multiple Vitamins-Minerals (MULTIVITAMIN WITH MINERALS) tablet   Oral   Take 1 tablet by mouth daily.         . pantoprazole (PROTONIX) 40 MG tablet   Oral   Take 40 mg by mouth 2 (two) times daily.         . sucralfate (CARAFATE) 1 G tablet   Oral   Take 1 g by mouth 4 (four) times daily.         Marland Kitchen. tiZANidine (ZANAFLEX) 4 MG tablet   Oral   Take 4-8 mg by mouth 2 (two) times daily. Takes 1 tablet in the morning and 2 tablets in the evening         . HYDROcodone-acetaminophen (NORCO/VICODIN) 5-325 MG per tablet   Oral   Take 1 tablet by mouth every 4 (four) hours as needed.   20 tablet   0    BP 151/111  Pulse 105  Temp(Src) 98.5 F (36.9 C) (Oral)  Resp 16  Wt 246 lb (111.585 kg)  SpO2 97% Physical Exam  Nursing note and vitals reviewed. Constitutional: She is oriented to person, place, and time. She appears well-developed and  well-nourished.  HENT:  Head: Normocephalic and atraumatic.  Eyes: Conjunctivae and EOM are normal. Pupils are equal, round, and reactive to light.  Neck: Normal range of motion and phonation normal. Neck supple.  Cardiovascular: Normal rate, regular rhythm and intact distal pulses.   Pulmonary/Chest: Effort normal and breath sounds normal. She exhibits no tenderness.  Abdominal: Soft. She exhibits no distension and no mass. There is tenderness (Mild left, extreme lower quadrant tenderness to palpation.). There is no rebound and no guarding.  No left groin mass, tenderness, or swelling  Musculoskeletal: Normal range of motion.  Neurological: She is alert and oriented to person, place, and time. She exhibits normal muscle tone.  Skin: Skin is warm and dry.  Psychiatric: She has a normal mood and affect. Her behavior is normal. Judgment and thought content normal.    ED Course  Procedures (including critical care time) Medications  ketorolac (TORADOL) injection 60 mg (60 mg Intramuscular Given 05/07/13 1018)    Patient Vitals for the past 24 hrs:  BP Temp Temp src Pulse Resp SpO2 Weight  05/07/13 0904 151/111 mmHg 98.5 F (36.9 C) Oral 105 16 97 % 246 lb (111.585 kg)    At discharge- Reevaluation with update and discussion. After initial assessment and treatment, an updated evaluation reveals no additional complaints. Shaunessy Dobratz L       MDM   Final diagnoses:  Abdominal pain     Nonspecific abdominal pain, already evaluated with CT scan. Vital signs are reassuring. Suspect mild elevation of pressure ulcers related to her pain. She has short term followup scheduled with GI, for tomorrow. Doubt colitis, UTI, abdominal hernia, or lumbar radiculopathy.  Nursing Notes Reviewed/ Care Coordinated Applicable Imaging Reviewed- By CT on 05/01/13 Interpretation of Laboratory Data incorporated into ED treatment  The patient appears reasonably screened and/or stabilized for discharge  and I doubt any other medical condition or other [facility] requiring further screening, evaluation, or treatment in the ED at this time prior to discharge.  Plan: Home Medications- Hydrocodone (She states can tolerate with  Benadryl); Home Treatments- Rest; return here if the recommended treatment, does not improve the symptoms; Recommended follow up- GI tomorrow as scheduled   Flint Melter, MD 05/07/13 [phone]

## 2013-05-07 NOTE — Discharge Instructions (Signed)
Abdominal Pain, Adult °Many things can cause abdominal pain. Usually, abdominal pain is not caused by a disease and will improve without treatment. It can often be observed and treated at home. Your health care provider will do a physical exam and possibly order blood tests and X-rays to help determine the seriousness of your pain. However, in many cases, more time must pass before a clear cause of the pain can be found. Before that point, your health care provider may not know if you need more testing or further treatment. °HOME CARE INSTRUCTIONS  °Monitor your abdominal pain for any changes. The following actions may help to alleviate any discomfort you are experiencing: °· Only take over-the-counter or prescription medicines as directed by your health care provider. °· Do not take laxatives unless directed to do so by your health care provider. °· Try a clear liquid diet (broth, tea, or water) as directed by your health care provider. Slowly move to a bland diet as tolerated. °SEEK MEDICAL CARE IF: °· You have unexplained abdominal pain. °· You have abdominal pain associated with nausea or diarrhea. °· You have pain when you urinate or have a bowel movement. °· You experience abdominal pain that wakes you in the night. °· You have abdominal pain that is worsened or improved by eating food. °· You have abdominal pain that is worsened with eating fatty foods. °SEEK IMMEDIATE MEDICAL CARE IF:  °· Your pain does not go away within 2 hours. °· You have a fever. °· You keep throwing up (vomiting). °· Your pain is felt only in portions of the abdomen, such as the right side or the left lower portion of the abdomen. °· You pass bloody or black tarry stools. °MAKE SURE YOU: °· Understand these instructions.   °· Will watch your condition.   °· Will get help right away if you are not doing well or get worse.   °Document Released: 11/25/2004 Document Revised: 12/06/2012 Document Reviewed: 10/25/2012 °[employer]® Patient  Information ©2014 [employer]. ° °

## 2013-05-08 ENCOUNTER — Encounter: Payer: Self-pay | Admitting: Internal Medicine

## 2013-05-08 ENCOUNTER — Ambulatory Visit (INDEPENDENT_AMBULATORY_CARE_PROVIDER_SITE_OTHER): Payer: 59 | Admitting: Internal Medicine

## 2013-05-08 VITALS — BP 118/90 | HR 84 | Ht 63.39 in | Wt 247.2 lb

## 2013-05-08 DIAGNOSIS — R6881 Early satiety: Secondary | ICD-10-CM

## 2013-05-08 DIAGNOSIS — R109 Unspecified abdominal pain: Secondary | ICD-10-CM

## 2013-05-08 DIAGNOSIS — R110 Nausea: Secondary | ICD-10-CM

## 2013-05-08 DIAGNOSIS — Z9884 Bariatric surgery status: Secondary | ICD-10-CM | POA: Insufficient documentation

## 2013-05-08 DIAGNOSIS — E669 Obesity, unspecified: Secondary | ICD-10-CM

## 2013-05-08 DIAGNOSIS — R197 Diarrhea, unspecified: Secondary | ICD-10-CM

## 2013-05-08 DIAGNOSIS — R198 Other specified symptoms and signs involving the digestive system and abdomen: Secondary | ICD-10-CM

## 2013-05-08 DIAGNOSIS — R1032 Left lower quadrant pain: Secondary | ICD-10-CM

## 2013-05-08 MED ORDER — HYOSCYAMINE SULFATE 0.125 MG SL SUBL: 0.1250 mg | SUBLINGUAL_TABLET | SUBLINGUAL | Status: DC | PRN

## 2013-05-08 MED ORDER — OXYCODONE-ACETAMINOPHEN 5-325 MG PO TABS: 1.0000 | ORAL_TABLET | Freq: Four times a day (QID) | ORAL | Status: DC | PRN

## 2013-05-08 MED ORDER — CIPROFLOXACIN HCL 500 MG PO TABS: 500.0000 mg | ORAL_TABLET | Freq: Two times a day (BID) | ORAL | Status: DC

## 2013-05-08 MED ORDER — METRONIDAZOLE 500 MG PO TABS: 500.0000 mg | ORAL_TABLET | Freq: Two times a day (BID) | ORAL | Status: DC

## 2013-05-08 MED ORDER — MOVIPREP 100 G PO SOLR: ORAL | Status: DC

## 2013-05-08 NOTE — Patient Instructions (Signed)
We have sent the following medications to your pharmacy for you to pick up at your convenience: Cipro, flagyl, levsin, percocet.  Continue taking your PPI and carafate   You have been scheduled for an endoscopy/Colonoscopywith propofol. Please follow written instructions given to you at your visit today. If you use inhalers (even only as needed), please bring them with you on the day of your procedure. Your physician has requested that you go to www.startemmi.com and enter the access code given to you at your visit today. This web site gives a general overview about your procedure. However, you should still follow specific instructions given to you by our office regarding your preparation for the procedure.

## 2013-05-08 NOTE — Progress Notes (Signed)
Patient ID: DEEANNA Delacruz, female   DOB: [birth year], 48 y.o.   MRN: [id number] HPI: Carly Delacruz is a 48 yo female with PMH of endometriosis status post total abdominal hysterectomy and oophorectomy, gallstones status post cholecystectomy, morbid obesity status post gastric bypass, GERD, hypertension who is seen in consultation at the request of Dr. Volanda Napoleon to evaluate left lower quadrant abdominal pain. The patient reports she's had 7 days of nonstop left lower quadrant abdominal pain. This is described as aching but also at times sharp. It has been present intermittently for several months but constant over one week. Several days to weeks ago she was having left upper quadrant abdominal pain which is radiating down the left side of her abdomen. She was started on Carafate which helped with the upper abdominal component of the pain. She remembers this pain dating back to early September. She is also noted a change in bowel habit with smaller, looser stools. She's also had more gas and bloating. She denies blood in her stool or melena. Evacuation has felt incomplete of late. She was seen recently in the ER for chronic back pain and it was hypothesized that possibly back pain was also leading to left lower quadrant abdominal pain. He was given a prescription for hydrocodone which he has used 1-2 tablets. This is helped with the pain only slightly. She has missed work over the last week but would like to return as soon as possible. Appetite has been decreased with some mild nausea. No vomiting. She does endorse early satiety but no weight loss. She is taking pantoprazole 40 mg twice daily.  She was seen by primary care and CT scan was performed along with blood work.  She reports a sister with a history of colitis. No known family history of GI malignancy  Also she reports she was seen by her gynecologist and reports being told this was not GYN if she needed to see a "gastrointestinal specialist"  Past  Medical History  Diagnosis Date  . Fatigue 2012  . Malaise 2012  . Insomnia   . Endometriosis   . Sleep apnea 2005  . Morbid obesity 2005  . Yeast infection   . Trichomonas   . Complication of anesthesia   . H/O varicella   . H/O mumps   . H/O bladder infections   . Insomnia secondary to situational depression 06/06/07  . Atrophic vaginitis 2009  . BV (bacterial vaginosis) 2006  . Liver mass, right lobe 08/13/04    Nodule  . Arthritis 12/10/03  . SUI (stress urinary incontinence, female) 08/12/03  . History of candidal vulvovaginitis 03/21/03  . Vulvar lesion 03/21/03  . Anemia   . Gallstones   . GERD (gastroesophageal reflux disease)   . HTN (hypertension)     Past Surgical History  Procedure Laterality Date  . Wisdom tooth extraction    . Appendectomy  2001  . Tonsillectomy    . Cholecystectomy  2011  . Abdominal hysterectomy  2001  . Gastric bypass  2007  . Laparotomy    . Dilation and curettage of uterus      Current Outpatient Prescriptions  Medication Sig Dispense Refill  . buPROPion (WELLBUTRIN XL) 150 MG 24 hr tablet Take 150 mg by mouth daily.      Marland Kitchen CALCIUM PO Take 1 tablet by mouth daily.       . Cyanocobalamin (VITAMIN B-12 PO) Take 1 tablet by mouth daily.       . diphenhydrAMINE (BENADRYL) 25  MG tablet Take 25 mg by mouth every 6 (six) hours as needed for allergies.      . Fe Fum-FePoly-FA-Vit C-Vit B3 (FOLIVANE-F PO) Take 1 capsule by mouth daily.      . hydrochlorothiazide (HYDRODIURIL) 50 MG tablet Take 50 mg by mouth daily.      Marland Kitchen HYDROcodone-acetaminophen (NORCO/VICODIN) 5-325 MG per tablet Take 1 tablet by mouth every 4 (four) hours as needed.  20 tablet  0  . IRON PO Take 1 tablet by mouth daily.       . Menthol, Topical Analgesic, (BIOFREEZE ROLL-ON COLORLESS EX) Apply 1 application topically as needed (for pain).      . Multiple Vitamins-Minerals (MULTIVITAMIN WITH MINERALS) tablet Take 1 tablet by mouth daily.      . pantoprazole (PROTONIX) 40 MG  tablet Take 40 mg by mouth 2 (two) times daily.      . sucralfate (CARAFATE) 1 G tablet Take 1 g by mouth 4 (four) times daily.      Marland Kitchen tiZANidine (ZANAFLEX) 4 MG tablet Take 4-8 mg by mouth 2 (two) times daily. Takes 1 tablet in the morning and 2 tablets in the evening      . ciprofloxacin (CIPRO) 500 MG tablet Take 1 tablet (500 mg total) by mouth 2 (two) times daily. Take 1 tablet (500 mg total) by mouth 2 (two) times daily for 7 days  14 tablet  0  . hyoscyamine (LEVSIN SL) 0.125 MG SL tablet Place 1 tablet (0.125 mg total) under the tongue every 4 (four) hours as needed.  30 tablet  0  . metroNIDAZOLE (FLAGYL) 500 MG tablet Take 1 tablet (500 mg total) by mouth 2 (two) times daily. 1 tablet by mouth 2 times a day for seven days  21 tablet  0  . MOVIPREP 100 G SOLR Use per prep instruction  1 kit  0  . oxyCODONE-acetaminophen (PERCOCET/ROXICET) 5-325 MG per tablet Take 1 tablet by mouth every 6 (six) hours as needed for severe pain.  30 tablet  0   No current facility-administered medications for this visit.    Allergies  Allergen Reactions  . Morphine And Related Shortness Of Breath and Itching  . Septra [Sulfamethoxazole-Tmp Ds] Anaphylaxis and Hives  . Codeine Itching  . Darvocet [Propoxyphene N-Acetaminophen] Itching  . Dilaudid [Hydromorphone Hcl] Itching  . Sulfa Antibiotics   . Ultram [Tramadol] Anxiety    "panic attacks"    Family History  Problem Relation Age of Onset  . Colitis Sister   . Diabetes Paternal Grandmother   . Leukemia Maternal Grandmother   . Arthritis Mother     History  Substance Use Topics  . Smoking status: Never Smoker   . Smokeless tobacco: Never Used  . Alcohol Use: No    ROS: As per history of present illness, otherwise negative  BP 118/90  Pulse 84  Ht 5' 3.39" (1.61 m)  Wt 247 lb 4 oz (112.152 kg)  BMI 43.27 kg/m2 Constitutional: Well-developed and well-nourished. Patient rocking back and forth at times in her chair holding left lower  quadrant HEENT: Normocephalic and atraumatic. Oropharynx is clear and moist. No oropharyngeal exudate. Conjunctivae are normal.  No scleral icterus. Neck: Neck supple. Trachea midline. Cardiovascular: Normal rate, regular rhythm and intact distal pulses Pulmonary/chest: Effort normal and breath sounds normal. No wheezing, rales or rhonchi. Abdominal: Soft, left middle and lower quadrant tenderness to palpation without rebound or guarding, nondistended. Bowel sounds active throughout. There are no masses palpable. Extremities: no  clubbing, cyanosis, or edema Neurological: Alert and oriented to person place and time. Skin: Skin is warm and dry. No rashes noted. Psychiatric: Normal mood and affect. Behavior is normal.  RELEVANT LABS AND IMAGING: CT ABDOMEN AND PELVIS WITH CONTRAST -- 05/01/2013   TECHNIQUE: Multidetector CT imaging of the abdomen and pelvis was performed using the standard protocol following bolus administration of intravenous contrast.   CONTRAST:  162m OMNIPAQUE IOHEXOL 300 MG/ML  SOLN   COMPARISON:  09/12/2009.   FINDINGS: Negative lung bases. No pericardial or pleural effusion. No acute osseous abnormality identified.   No pelvic free fluid. Uterus surgically absent. Ovaries not identified. Unremarkable bladder.   Decompressed distal colon, felt to account for the somewhat indistinct appearance of the colonic walls. Oral contrast has reached the sigmoid colon. The left colon is decompressed. There is mild diverticulosis at the splenic flexure with no definite inflammation. Negative transverse colon which passes posterior to the gastrojejunostomy loop. Negative hepatic flexure. Negative right colon. Negative terminal ileum. No dilated small bowel.   Stable appearance of gastric bypass. Decompressed distal stomach. Duodenum unremarkable. No dilated or inflamed small bowel identified.   Gallbladder surgically absent. Stable and negative liver (small inferior  right lobe cyst unchanged). Negative spleen, pancreas and adrenal glands. Stable, negative kidneys; subcentimeter cortical cysts slightly larger than in 2011. No abdominal free fluid. Portal venous system within normal limits. Major arterial structures in the abdomen and pelvis are patent. No lymphadenopathy.   IMPRESSION: No acute or inflammatory process in the abdomen or pelvis. Stable postoperative changes including gastric bypass and hysterectomy.  Lasted 05/01/2013 --BMP within normal limits Hemoglobin 13.9, hematocrit 42.7, MCV 86.5, WBC 4.3, platelet 293 Lipase 7 LFTs within normal limits Urinalysis negative   ASSESSMENT/PLAN: 48yo female with PMH of endometriosis status post total abdominal hysterectomy and oophorectomy, gallstones status post cholecystectomy, morbid obesity status post gastric bypass, GERD, hypertension who is seen in consultation at the request of Dr. WVolanda Napoleonto evaluate left lower quadrant abdominal pain  1.  LLQ pain/change in bowel habits -- CT scan findings reviewed. No evidence for diverticular disease/diverticulitis or colitis. Her pain however subjectively is significant. I have recommended colonoscopy for further evaluation, and after discussion of the risks and benefits she is agreeable to proceed. I will empirically treat with antibiotics, for possible diverticulitis below the level of detection of CT one week ago. Ciprofloxacin and metronidazole, 500 mg twice daily and 500 mg 3 times daily, respectively. Prescription for Levsin to be used as needed and as directed for lower abdominal cramping pain. Discontinue hydrocodone and prescription given for Percocet 5/325 #30. I discussed with her that I do not want her on chronic narcotics for this pain and I will not provide chronic pain medicines in the future, but am willing to give a short prescription while workup ongoing.  2.  Early satiety/nausea -- she was having upper pain raising question of upper GI  pathology in the setting of gastric bypass. EGD recommended to rule out marginal ulceration or other inflammation. Risks and benefits discussed and she was agreeable to proceed. She will continue twice a day PPI and Carafate until this procedure.  Work note given through Thursday of this week  Further recommendations after procedures

## 2013-05-10 ENCOUNTER — Telehealth: Payer: Self-pay | Admitting: Internal Medicine

## 2013-05-10 NOTE — Telephone Encounter (Signed)
Letter faxed and patient notified.  Faxed to [phone]

## 2013-05-10 NOTE — Telephone Encounter (Signed)
Okay for an additional day off work due to symptoms discussed recently in clinic

## 2013-05-10 NOTE — Telephone Encounter (Signed)
Patient would like a work note stating she can return to work tomorrow not today.  OK?

## 2013-05-14 ENCOUNTER — Telehealth: Payer: Self-pay | Admitting: Internal Medicine

## 2013-05-14 DIAGNOSIS — R109 Unspecified abdominal pain: Secondary | ICD-10-CM

## 2013-05-14 DIAGNOSIS — R110 Nausea: Secondary | ICD-10-CM

## 2013-05-14 DIAGNOSIS — R197 Diarrhea, unspecified: Secondary | ICD-10-CM

## 2013-05-14 MED ORDER — OXYCODONE-ACETAMINOPHEN 5-325 MG PO TABS: 1.0000 | ORAL_TABLET | Freq: Four times a day (QID) | ORAL | Status: DC | PRN

## 2013-05-14 NOTE — Telephone Encounter (Signed)
CT abd pelvis done recently without cause for abd pain and now I see that she has not responded for improved with abx. She is scheduled for procedures which are pending, unfortunately I do not see an earlier spot for these procedures in my [facility] schedule If pain is radiating into buttock then the origin could be lumbar spine, and PCP visit would be appropriate Oxycodone can be renewed x 1 while waiting to complete our evaluation.  APP visit if necessary

## 2013-05-14 NOTE — Telephone Encounter (Signed)
Pt states she has finished taking the cipro she was given and has 2 pills of Flagyl left. States she has taken all of her percocet, took the last one at 3am this morning. She is taking levsin every 4 hours and states she is still having pain in her left lower quadrant that radiates down her leg into her bottom. She rates the pain at a 6-7/10 and states it is a nagging pain. [facility] but wants to know what she can do in the meantime. Dr. Rhea BeltonPyrtle please advise.

## 2013-05-14 NOTE — Telephone Encounter (Signed)
Pt aware. Pt scheduled for Colon 05/24/13 and EGD left scheduled for 06/04/13. Pt will pick up script for pain med and will need work note for 05/23/13 and 05/24/13. Updated instructions left up front with script for pt.

## 2013-05-21 ENCOUNTER — Other Ambulatory Visit: Payer: Self-pay | Admitting: Internal Medicine

## 2013-05-21 ENCOUNTER — Telehealth: Payer: Self-pay | Admitting: Internal Medicine

## 2013-05-22 ENCOUNTER — Other Ambulatory Visit: Payer: Self-pay | Admitting: Gastroenterology

## 2013-05-22 ENCOUNTER — Telehealth: Payer: Self-pay | Admitting: Gastroenterology

## 2013-05-22 DIAGNOSIS — R110 Nausea: Secondary | ICD-10-CM

## 2013-05-22 DIAGNOSIS — R197 Diarrhea, unspecified: Secondary | ICD-10-CM

## 2013-05-22 DIAGNOSIS — R109 Unspecified abdominal pain: Secondary | ICD-10-CM

## 2013-05-22 MED ORDER — OXYCODONE-ACETAMINOPHEN 5-325 MG PO TABS: 1.0000 | ORAL_TABLET | Freq: Four times a day (QID) | ORAL | Status: DC | PRN

## 2013-05-22 NOTE — Telephone Encounter (Signed)
Please see previous phone note.  

## 2013-05-22 NOTE — Telephone Encounter (Signed)
Called pt's cell phone VM has not been set up yet.  Called home phone left VM for her to call me back regarding pain medication refill

## 2013-05-24 ENCOUNTER — Encounter: Payer: Self-pay | Admitting: Internal Medicine

## 2013-05-24 ENCOUNTER — Ambulatory Visit (AMBULATORY_SURGERY_CENTER): Payer: 59 | Admitting: Internal Medicine

## 2013-05-24 VITALS — BP 120/78 | HR 75 | Temp 98.5°F | Resp 19 | Ht 63.0 in | Wt 247.0 lb

## 2013-05-24 DIAGNOSIS — R198 Other specified symptoms and signs involving the digestive system and abdomen: Secondary | ICD-10-CM

## 2013-05-24 DIAGNOSIS — R194 Change in bowel habit: Secondary | ICD-10-CM

## 2013-05-24 DIAGNOSIS — R109 Unspecified abdominal pain: Secondary | ICD-10-CM

## 2013-05-24 DIAGNOSIS — R1032 Left lower quadrant pain: Secondary | ICD-10-CM

## 2013-05-24 MED ORDER — SODIUM CHLORIDE 0.9 % IV SOLN: 500.0000 mL | INTRAVENOUS | Status: DC

## 2013-05-24 NOTE — Op Note (Signed)
[facility] [address] [place], [id number]   COLONOSCOPY PROCEDURE REPORT  PATIENT: Carly Delacruz, Carly J.  MR#: [id number] BIRTHDATE: [birth year] , 47  yrs. old GENDER: Female ENDOSCOPIST: Beverley FiedlerJay M Malacki Mcphearson, MD REFERRED WG:[id number]:Catherine Clent RidgesWalsh, M.D. PROCEDURE DATE:  05/24/2013 PROCEDURE:   Colonoscopy, diagnostic First Screening Colonoscopy - Avg.  risk and is 50 yrs.  old or older - No.  Prior Negative Screening - Now for repeat screening. N/A  History of Adenoma - Now for follow-up colonoscopy & has been > or = to 3 yrs.  N/A  Polyps Removed Today? No.  Recommend repeat exam, <10 yrs? No. ASA CLASS:   Class III INDICATIONS:abdominal pain in the lower left quadrant and Change in bowel habits. MEDICATIONS: MAC sedation, administered by CRNA and propofol (Diprivan) 300mg  IV  DESCRIPTION OF PROCEDURE:   After the risks benefits and alternatives of the procedure were thoroughly explained, informed consent was obtained.  A digital rectal exam revealed no rectal mass.   The LB [id number]  endoscope was introduced through the anus and advanced to the terminal ileum which was intubated for a short distance. No adverse events experienced.   The quality of the prep was good, using MoviPrep  The instrument was then slowly withdrawn as the colon was fully examined.   COLON FINDINGS: The mucosa appeared normal in the terminal ileum. A normal appearing cecum, ileocecal valve, and appendiceal orifice were identified.  The ascending, hepatic flexure, transverse, splenic flexure, descending, sigmoid colon and rectum appeared unremarkable.  No polyps or cancers were seen.  Retroflexed views revealed no abnormalities. The time to cecum=4 minutes 15 seconds. Withdrawal time=9 minutes 00 seconds.  The scope was withdrawn and the procedure completed. COMPLICATIONS: There were no complications.  ENDOSCOPIC IMPRESSION: 1.   Normal mucosa in the terminal ileum 2.   Normal  colon  RECOMMENDATIONS: 1.  You should continue to follow colorectal cancer screening guidelines for "routine risk" patients with a repeat colonoscopy in 10 years.  There is no need for FOBT (stool) testing for at least 5 years. 2.  Return primary care for further evaluation of LLQ pain, query lumbar pathology.  Pain is not felt to be lower GI in nature   eSigned:  Beverley FiedlerJay M Braidyn Peace, MD 05/24/2013 11:54 AM   cc: The Patient and Elby Showersatherine Walsh, MD; Konrad DoloresIbtehal [facility]

## 2013-05-24 NOTE — Patient Instructions (Signed)
YOU HAD AN ENDOSCOPIC PROCEDURE TODAY AT THE [facility]: Refer to the procedure report that was given to you for any specific questions about what was found during the examination.  If the procedure report does not answer your questions, please call your gastroenterologist to clarify.  If you requested that your care partner not be given the details of your procedure findings, then the procedure report has been included in a sealed envelope for you to review at your convenience later.  YOU SHOULD EXPECT: Some feelings of bloating in the abdomen. Passage of more gas than usual.  Walking can help get rid of the air that was put into your GI tract during the procedure and reduce the bloating. If you had a lower endoscopy (such as a colonoscopy or flexible sigmoidoscopy) you may notice spotting of blood in your stool or on the toilet paper. If you underwent a bowel prep for your procedure, then you may not have a normal bowel movement for a few days.  DIET: Your first meal following the procedure should be a light meal and then it is ok to progress to your normal diet.  A half-sandwich or bowl of soup is an example of a good first meal.  Heavy or fried foods are harder to digest and may make you feel nauseous or bloated.  Likewise meals heavy in dairy and vegetables can cause extra gas to form and this can also increase the bloating.  Drink plenty of fluids but you should avoid alcoholic beverages for 24 hours.  ACTIVITY: Your care partner should take you home directly after the procedure.  You should plan to take it easy, moving slowly for the rest of the day.  You can resume normal activity the day after the procedure however you should NOT DRIVE or use heavy machinery for 24 hours (because of the sedation medicines used during the test).    SYMPTOMS TO REPORT IMMEDIATELY: A gastroenterologist can be reached at any hour.  During normal business hours, 8:30 AM to 5:00 PM Monday through Friday,  call [phone].  After hours and on weekends, please call the GI answering service at [phone] who will take a message and have the physician on call contact you.   Following lower endoscopy (colonoscopy or flexible sigmoidoscopy):  Excessive amounts of blood in the stool  Significant tenderness or worsening of abdominal pains  Swelling of the abdomen that is new, acute  Fever of 100F or higher    FOLLOW UP: If any biopsies were taken you will be contacted by phone or by letter within the next 1-3 weeks.  Call your gastroenterologist if you have not heard about the biopsies in 3 weeks.  Our staff will call the home number listed on your records the next business day following your procedure to check on you and address any questions or concerns that you may have at that time regarding the information given to you following your procedure. This is a courtesy call and so if there is no answer at the home number and we have not heard from you through the emergency physician on call, we will assume that you have returned to your regular daily activities without incident.  SIGNATURES/CONFIDENTIALITY: You and/or your care partner have signed paperwork which will be entered into your electronic medical record.  These signatures attest to the fact that that the information above on your After Visit Summary has been reviewed and is understood.  Full responsibility of the confidentiality   of this discharge information lies with you and/or your care-partner.     

## 2013-05-24 NOTE — Progress Notes (Signed)
Lidocaine-40mg IV prior to Propofol InductionPropofol given over incremental dosages 

## 2013-05-25 ENCOUNTER — Telehealth: Payer: Self-pay

## 2013-05-25 NOTE — Telephone Encounter (Signed)
  Follow up Call-  Call back number 05/24/2013  Post procedure Call Back phone  # [phone]  Permission to leave phone message Yes     Patient questions:  Do you have a fever, pain , or abdominal swelling? no Pain Score  0 *  Have you tolerated food without any problems? yes  Have you been able to return to your normal activities? yes  Do you have any questions about your discharge instructions: Diet   no Medications  no Follow up visit  yes  Do you have questions or concerns about your Care? yes  Actions: * If pain score is 4 or above: No action needed, pain <4.   The pt had a colonoscopy yesterday which was normal and you commented "pain is not felt to be lower GI in nature".  She has an EGD scheduled and wants to know if she is supposed to keep that appointment or cancel it.  Pt's [phone] and may leave a message if she does not answer. maw

## 2013-05-27 NOTE — Telephone Encounter (Signed)
Pt's EGD was to evaluate nausea and early satiety with hx of gastric bypass. Can proceed if symptoms continue to be troublesome, however, the LLQ pain is not felt to be related to a lower GI source after CT and colonoscopy were negative I recommended she contact her ortho MD to evaluate lumbar source of pain

## 2013-05-28 NOTE — Telephone Encounter (Signed)
Phone call returned to pt.  I read Dr. Lauro FranklinPyrtle's response to the message sent Friday.  Pt said she has a ortho appointment today.  She will see what he says and make a decision to either proceed with the egd or to cancel.  I suggested to keep the appointment for EGD for next Monday 06-04-13 and if she decided to cancel to call the office back.  Pt greed and said she would make her decision and let us know.  maw

## 2013-05-30 ENCOUNTER — Telehealth: Payer: Self-pay | Admitting: Internal Medicine

## 2013-05-30 NOTE — Telephone Encounter (Signed)
Called pt. Went over EGD instructions and told her I am mailing her out a copy of the instructions. Pt verbalized understanding

## 2013-06-01 ENCOUNTER — Emergency Department (HOSPITAL_COMMUNITY): Payer: 59

## 2013-06-01 ENCOUNTER — Encounter (HOSPITAL_COMMUNITY): Payer: Self-pay | Admitting: Emergency Medicine

## 2013-06-01 ENCOUNTER — Emergency Department (HOSPITAL_COMMUNITY)
Admission: EM | Admit: 2013-06-01 | Discharge: 2013-06-01 | Disposition: A | Discharge status: Home / Self Care | Payer: 59 | Attending: Emergency Medicine | Admitting: Emergency Medicine

## 2013-06-01 DIAGNOSIS — M129 Arthropathy, unspecified: Secondary | ICD-10-CM | POA: Insufficient documentation

## 2013-06-01 DIAGNOSIS — A499 Bacterial infection, unspecified: Secondary | ICD-10-CM | POA: Insufficient documentation

## 2013-06-01 DIAGNOSIS — F489 Nonpsychotic mental disorder, unspecified: Secondary | ICD-10-CM | POA: Insufficient documentation

## 2013-06-01 DIAGNOSIS — F5105 Insomnia due to other mental disorder: Secondary | ICD-10-CM

## 2013-06-01 DIAGNOSIS — N760 Acute vaginitis: Secondary | ICD-10-CM | POA: Insufficient documentation

## 2013-06-01 DIAGNOSIS — K219 Gastro-esophageal reflux disease without esophagitis: Secondary | ICD-10-CM | POA: Insufficient documentation

## 2013-06-01 DIAGNOSIS — Z9884 Bariatric surgery status: Secondary | ICD-10-CM | POA: Insufficient documentation

## 2013-06-01 DIAGNOSIS — B9689 Other specified bacterial agents as the cause of diseases classified elsewhere: Secondary | ICD-10-CM | POA: Insufficient documentation

## 2013-06-01 DIAGNOSIS — R109 Unspecified abdominal pain: Secondary | ICD-10-CM

## 2013-06-01 DIAGNOSIS — Z90710 Acquired absence of both cervix and uterus: Secondary | ICD-10-CM | POA: Insufficient documentation

## 2013-06-01 DIAGNOSIS — G8929 Other chronic pain: Secondary | ICD-10-CM | POA: Insufficient documentation

## 2013-06-01 DIAGNOSIS — I10 Essential (primary) hypertension: Secondary | ICD-10-CM | POA: Insufficient documentation

## 2013-06-01 DIAGNOSIS — Z9089 Acquired absence of other organs: Secondary | ICD-10-CM | POA: Insufficient documentation

## 2013-06-01 DIAGNOSIS — D649 Anemia, unspecified: Secondary | ICD-10-CM | POA: Insufficient documentation

## 2013-06-01 DIAGNOSIS — F4321 Adjustment disorder with depressed mood: Secondary | ICD-10-CM | POA: Insufficient documentation

## 2013-06-01 DIAGNOSIS — Z9889 Other specified postprocedural states: Secondary | ICD-10-CM | POA: Insufficient documentation

## 2013-06-01 DIAGNOSIS — R748 Abnormal levels of other serum enzymes: Secondary | ICD-10-CM | POA: Insufficient documentation

## 2013-06-01 DIAGNOSIS — Z79899 Other long term (current) drug therapy: Secondary | ICD-10-CM | POA: Insufficient documentation

## 2013-06-01 LAB — CBC WITH DIFFERENTIAL/PLATELET
Basophils Absolute: 0 10*3/uL (ref 0.0–0.1)
Basophils Relative: 0 % (ref 0–1)
Eosinophils Absolute: 0.1 10*3/uL (ref 0.0–0.7)
Eosinophils Relative: 1 % (ref 0–5)
HCT: 42.5 % (ref 36.0–46.0)
Hemoglobin: 13.9 g/dL (ref 12.0–15.0)
Lymphocytes Relative: 36 % (ref 12–46)
Lymphs Abs: 2 10*3/uL (ref 0.7–4.0)
MCH: 28 pg (ref 26.0–34.0)
MCHC: 32.7 g/dL (ref 30.0–36.0)
MCV: 85.7 fL (ref 78.0–100.0)
Monocytes Absolute: 0.5 10*3/uL (ref 0.1–1.0)
Monocytes Relative: 9 % (ref 3–12)
Neutro Abs: 2.9 10*3/uL (ref 1.7–7.7)
Neutrophils Relative %: 53 % (ref 43–77)
Platelets: 341 10*3/uL (ref 150–400)
RBC: 4.96 MIL/uL (ref 3.87–5.11)
RDW: 13.9 % (ref 11.5–15.5)
WBC: 5.5 10*3/uL (ref 4.0–10.5)

## 2013-06-01 LAB — WET PREP, GENITAL
Trich, Wet Prep: NONE SEEN
YEAST WET PREP: NONE SEEN

## 2013-06-01 LAB — URINALYSIS, ROUTINE W REFLEX MICROSCOPIC
Bilirubin Urine: NEGATIVE
Glucose, UA: NEGATIVE mg/dL
Hgb urine dipstick: NEGATIVE
Ketones, ur: NEGATIVE mg/dL
Leukocytes, UA: NEGATIVE
Nitrite: NEGATIVE
Protein, ur: NEGATIVE mg/dL
Specific Gravity, Urine: 1.026 (ref 1.005–1.030)
Urobilinogen, UA: 0.2 mg/dL (ref 0.0–1.0)
pH: 7 (ref 5.0–8.0)

## 2013-06-01 LAB — COMPREHENSIVE METABOLIC PANEL
ALT: 54 U/L — ABNORMAL HIGH (ref 0–35)
AST: 45 U/L — ABNORMAL HIGH (ref 0–37)
Albumin: 4.2 g/dL (ref 3.5–5.2)
Alkaline Phosphatase: 82 U/L (ref 39–117)
BUN: 16 mg/dL (ref 6–23)
CO2: 26 mEq/L (ref 19–32)
Calcium: 9.8 mg/dL (ref 8.4–10.5)
Chloride: 97 mEq/L (ref 96–112)
Creatinine, Ser: 0.74 mg/dL (ref 0.50–1.10)
GFR calc Af Amer: >90 mL/min (ref >90)
GFR calc non Af Amer: >90 mL/min (ref >90)
Glucose, Bld: 101 mg/dL — ABNORMAL HIGH (ref 70–99)
Potassium: 4.4 mEq/L (ref 3.7–5.3)
Sodium: 138 mEq/L (ref 137–147)
Total Bilirubin: 0.4 mg/dL (ref 0.3–1.2)
Total Protein: 8.2 g/dL (ref 6.0–8.3)

## 2013-06-01 LAB — LIPASE, BLOOD: Lipase: 17 U/L (ref 11–59)

## 2013-06-01 MED ORDER — HYDROMORPHONE HCL PF 1 MG/ML IJ SOLN
1.0000 mg | Freq: Once | INTRAMUSCULAR | Status: AC
  Administered 2013-06-01: 1 mg via INTRAVENOUS
  Filled 2013-06-01: qty 1

## 2013-06-01 MED ORDER — DIPHENHYDRAMINE HCL 25 MG PO CAPS
25.0000 mg | ORAL_CAPSULE | Freq: Four times a day (QID) | ORAL | Status: DC | PRN
  Administered 2013-06-01 (×2): 25 mg via ORAL
  Filled 2013-06-01 (×2): qty 1

## 2013-06-01 MED ORDER — METRONIDAZOLE 500 MG PO TABS: 500.0000 mg | ORAL_TABLET | Freq: Two times a day (BID) | ORAL | Status: DC

## 2013-06-01 MED ORDER — HYDROMORPHONE HCL PF 1 MG/ML IJ SOLN
1.0000 mg | Freq: Once | INTRAMUSCULAR | Status: AC
Start: 2013-06-01 — End: 2013-06-01
  Administered 2013-06-01: 1 mg via INTRAVENOUS
  Filled 2013-06-01: qty 1

## 2013-06-01 MED ORDER — KETOROLAC TROMETHAMINE 30 MG/ML IJ SOLN
30.0000 mg | Freq: Once | INTRAMUSCULAR | Status: AC
  Administered 2013-06-01: 30 mg via INTRAVENOUS
  Filled 2013-06-01: qty 1

## 2013-06-01 MED ORDER — OXYCODONE-ACETAMINOPHEN 10-325 MG PO TABS
1.0000 | ORAL_TABLET | Freq: Four times a day (QID) | ORAL | Status: DC | PRN
Start: 2013-06-01 — End: 2014-06-16

## 2013-06-01 MED ORDER — DIPHENHYDRAMINE HCL 50 MG/ML IJ SOLN
12.5000 mg | Freq: Once | INTRAMUSCULAR | Status: AC
  Administered 2013-06-01: 12.5 mg via INTRAVENOUS
  Filled 2013-06-01: qty 1

## 2013-06-01 NOTE — Discharge Instructions (Signed)
Abdominal Pain, Women °Abdominal (stomach, pelvic, or belly) pain can be caused by many things. It is important to tell your doctor: °· The location of the pain. °· Does it come and go or is it present all the time? °· Are there things that start the pain (eating certain foods, exercise)? °· Are there other symptoms associated with the pain (fever, nausea, vomiting, diarrhea)? °All of this is helpful to know when trying to find the cause of the pain. °CAUSES  °· Stomach: virus or bacteria infection, or ulcer. °· Intestine: appendicitis (inflamed appendix), regional ileitis (Crohn's disease), ulcerative colitis (inflamed colon), irritable bowel syndrome, diverticulitis (inflamed diverticulum of the colon), or cancer of the stomach or intestine. °· Gallbladder disease or stones in the gallbladder. °· Kidney disease, kidney stones, or infection. °· Pancreas infection or cancer. °· Fibromyalgia (pain disorder). °· Diseases of the female organs: °· Uterus: fibroid (non-cancerous) tumors or infection. °· Fallopian tubes: infection or tubal pregnancy. °· Ovary: cysts or tumors. °· Pelvic adhesions (scar tissue). °· Endometriosis (uterus lining tissue growing in the pelvis and on the pelvic organs). °· Pelvic congestion syndrome (female organs filling up with blood just before the menstrual period). °· Pain with the menstrual period. °· Pain with ovulation (producing an egg). °· Pain with an IUD (intrauterine device, birth control) in the uterus. °· Cancer of the female organs. °· Functional pain (pain not caused by a disease, may improve without treatment). °· Psychological pain. °· Depression. °DIAGNOSIS  °Your doctor will decide the seriousness of your pain by doing an examination. °· Blood tests. °· X-rays. °· Ultrasound. °· CT scan (computed tomography, special type of X-ray). °· MRI (magnetic resonance imaging). °· Cultures, for infection. °· Barium enema (dye inserted in the large intestine, to better view it with  X-rays). °· Colonoscopy (looking in intestine with a lighted tube). °· Laparoscopy (minor surgery, looking in abdomen with a lighted tube). °· Major abdominal exploratory surgery (looking in abdomen with a large incision). °TREATMENT  °The treatment will depend on the cause of the pain.  °· Many cases can be observed and treated at home. °· Over-the-counter medicines recommended by your caregiver. °· Prescription medicine. °· Antibiotics, for infection. °· Birth control pills, for painful periods or for ovulation pain. °· Hormone treatment, for endometriosis. °· Nerve blocking injections. °· Physical therapy. °· Antidepressants. °· Counseling with a psychologist or psychiatrist. °· Minor or major surgery. °HOME CARE INSTRUCTIONS  °· Do not take laxatives, unless directed by your caregiver. °· Take over-the-counter pain medicine only if ordered by your caregiver. Do not take aspirin because it can cause an upset stomach or bleeding. °· Try a clear liquid diet (broth or water) as ordered by your caregiver. Slowly move to a bland diet, as tolerated, if the pain is related to the stomach or intestine. °· Have a thermometer and take your temperature several times a day, and record it. °· Bed rest and sleep, if it helps the pain. °· Avoid sexual intercourse, if it causes pain. °· Avoid stressful situations. °· Keep your follow-up appointments and tests, as your caregiver orders. °· If the pain does not go away with medicine or surgery, you may try: °· Acupuncture. °· Relaxation exercises (yoga, meditation). °· Group therapy. °· Counseling. °SEEK MEDICAL CARE IF:  °· You notice certain foods cause stomach pain. °· Your home care treatment is not helping your pain. °· You need stronger pain medicine. °· You want your IUD removed. °· You feel faint or   lightheaded. °· You develop nausea and vomiting. °· You develop a rash. °· You are having side effects or an allergy to your medicine. °SEEK IMMEDIATE MEDICAL CARE IF:  °· Your  pain does not go away or gets worse. °· You have a fever. °· Your pain is felt only in portions of the abdomen. The right side could possibly be appendicitis. The left lower portion of the abdomen could be colitis or diverticulitis. °· You are passing blood in your stools (bright red or black tarry stools, with or without vomiting). °· You have blood in your urine. °· You develop chills, with or without a fever. °· You pass out. °MAKE SURE YOU:  °· Understand these instructions. °· Will watch your condition. °· Will get help right away if you are not doing well or get worse. °Document Released: 12/13/2006 Document Revised: 05/10/2011 Document Reviewed: 01/02/2009 °ExitCare® Patient Information ©2014 [employer]. ° °Bacterial Vaginosis °Bacterial vaginosis is a vaginal infection that occurs when the normal balance of bacteria in the vagina is disrupted. It results from an overgrowth of certain bacteria. This is the most common vaginal infection in women of childbearing age. Treatment is important to prevent complications, especially in pregnant women, as it can cause a premature delivery. °CAUSES  °Bacterial vaginosis is caused by an increase in harmful bacteria that are normally present in smaller amounts in the vagina. Several different kinds of bacteria can cause bacterial vaginosis. However, the reason that the condition develops is not fully understood. °RISK FACTORS °Certain activities or behaviors can put you at an increased risk of developing bacterial vaginosis, including: °· Having a new sex partner or multiple sex partners. °· Douching. °· Using an intrauterine device (IUD) for contraception. °Women do not get bacterial vaginosis from toilet seats, bedding, swimming pools, or contact with objects around them. °SIGNS AND SYMPTOMS  °Some women with bacterial vaginosis have no signs or symptoms. Common symptoms include: °· Grey vaginal discharge. °· A fishlike odor with discharge, especially after sexual  intercourse. °· Itching or burning of the vagina and vulva. °· Burning or pain with urination. °DIAGNOSIS  °Your health care provider will take a medical history and examine the vagina for signs of bacterial vaginosis. A sample of vaginal fluid may be taken. Your health care provider will look at this sample under a microscope to check for bacteria and abnormal cells. A vaginal pH test may also be done.  °TREATMENT  °Bacterial vaginosis may be treated with antibiotic medicines. These may be given in the form of a pill or a vaginal cream. A second round of antibiotics may be prescribed if the condition comes back after treatment.  °HOME CARE INSTRUCTIONS  °· Only take over-the-counter or prescription medicines as directed by your health care provider. °· If antibiotic medicine was prescribed, take it as directed. Make sure you finish it even if you start to feel better. °· Do not have sex until treatment is completed. °· Tell all sexual partners that you have a vaginal infection. They should see their health care provider and be treated if they have problems, such as a mild rash or itching. °· Practice safe sex by using condoms and only having one sex partner. °SEEK MEDICAL CARE IF:  °· Your symptoms are not improving after 3 days of treatment. °· You have increased discharge or pain. °· You have a fever. °MAKE SURE YOU:  °· Understand these instructions. °· Will watch your condition. °· Will get help right away if you   are not doing well or get worse. °FOR MORE INFORMATION  °Centers for Disease Control and Prevention, Division of STD Prevention: www.cdc.gov/std °American Sexual Health Association (ASHA): www.ashastd.org  °Document Released: 02/15/2005 Document Revised: 12/06/2012 Document Reviewed: 09/27/2012 °ExitCare® Patient Information ©2014 [employer]. ° °

## 2013-06-01 NOTE — ED Provider Notes (Signed)
CSN: [id number]     Arrival date & time 06/01/13  1233 History   First MD Initiated Contact with Patient 06/01/13 1251     Chief Complaint  Patient presents with  . left sided abdominal/groin pain    HPI  Carly Delacruz is a 48 y.o. female with a PMH of endometriosis s/p TAH and oophorectomy, gallstones s/p cholecystectomy, morbid obesity status post gastric bypass, yeast infection, trichomonas, UTI, atrophic vaginitis, BV, valvular lesion, liver mass, GERD, sleep apnea, HTN and arthritis who presents to the ED for evaluation of abdominal pain. History was provided by the patient. Patient states that she went on a cruise in 08/2012 and developed a GI illness and has had abdominal pain ever since. States that abdominal pain was intermittent but has been constant since 05/02/13. Patient was seen by PCP with negative CT abdomen (05/01/13). Seen in the ED on 05/07/13 and discharged home on hydrocodone. Followed up with GI on 05/08/13 (Pyrtle) and prescribed Levsin, Percocet, Cipro and Flagyl which she completed with no relief. Had colonoscopy on 05/24/13 which was negative. Has EGD scheduled on 06/04/13. Pain thought to be related to left hip pain. Patient seen by orthopedics (Dr. Magnus Ivan) with negative left hip x-ray and hip injection for bursitis with no relief. Has chronic right sciatica and spinal stenosis with no acute changes. Was prescribed  Zanaflex by ortho with no relief. Has hx of gastric bypass in 2007 with Dr. Luretha Murphy. Patient describes constant sharp LLQ abdominal pain without radiation. Pain worse with movement and palpation. Percocet will "take the edge off but it never goes away." Patient also complains of dysuria starting this morning. No hematuria, vaginal discharge/bleeding, or genital sores. She also had nausea, decreased appetite, and feeling bloated. No diarrhea, constipation or emesis. No fevers, chest pain, SOB, cough, headache, dizziness or lightheadedness.   Past Medical History   Diagnosis Date  . Fatigue 2012  . Malaise 2012  . Insomnia   . Endometriosis   . Sleep apnea 2005  . Morbid obesity 2005  . Yeast infection   . Trichomonas   . Complication of anesthesia   . H/O varicella   . H/O mumps   . H/O bladder infections   . Insomnia secondary to situational depression 06/06/07  . Atrophic vaginitis 2009  . BV (bacterial vaginosis) 2006  . Liver mass, right lobe 08/13/04    Nodule  . Arthritis 12/10/03  . SUI (stress urinary incontinence, female) 08/12/03  . History of candidal vulvovaginitis 03/21/03  . Vulvar lesion 03/21/03  . Anemia   . Gallstones   . GERD (gastroesophageal reflux disease)   . HTN (hypertension)    Past Surgical History  Procedure Laterality Date  . Wisdom tooth extraction    . Appendectomy  2001  . Tonsillectomy    . Cholecystectomy  2011  . Abdominal hysterectomy  2001  . Gastric bypass  2007  . Laparotomy    . Dilation and curettage of uterus     Family History  Problem Relation Age of Onset  . Colitis Sister   . Diabetes Paternal Grandmother   . Leukemia Maternal Grandmother   . Arthritis Mother   . Prostate cancer Father    History  Substance Use Topics  . Smoking status: Never Smoker   . Smokeless tobacco: Never Used  . Alcohol Use: No   OB History   Grav Para Term Preterm Abortions TAB SAB Ect Mult Living   0 0  Review of Systems  Constitutional: Positive for appetite change. Negative for fever, chills, diaphoresis, activity change, fatigue and unexpected weight change.  HENT: Negative for congestion and sore throat.   Respiratory: Negative for cough and shortness of breath.   Cardiovascular: Negative for chest pain and leg swelling.  Gastrointestinal: Positive for nausea and abdominal pain. Negative for vomiting, diarrhea, constipation, blood in stool and rectal pain.  Genitourinary: Positive for dysuria. Negative for frequency, hematuria, flank pain, decreased urine volume, vaginal bleeding,  vaginal discharge, difficulty urinating, genital sores, vaginal pain and pelvic pain.  Musculoskeletal: Positive for back pain (chronic). Negative for gait problem and myalgias.  Skin: Negative for color change.  Neurological: Negative for dizziness, weakness, light-headedness, numbness and headaches.    Allergies  Morphine and related; Septra; Sulfa antibiotics; Codeine; Darvocet; Dilaudid; Adhesive; and Ultram  Home Medications   Current Outpatient Rx  Name  Route  Sig  Dispense  Refill  . buPROPion (WELLBUTRIN XL) 150 MG 24 hr tablet   Oral   Take 150 mg by mouth daily.         Marland Kitchen CALCIUM PO   Oral   Take 1 tablet by mouth daily.          . Cyanocobalamin (VITAMIN B-12 PO)   Oral   Take 1 tablet by mouth daily.          . diphenhydrAMINE (BENADRYL) 25 MG tablet   Oral   Take 25 mg by mouth every 6 (six) hours as needed for allergies.         . Fe Fum-FePoly-FA-Vit C-Vit B3 (FOLIVANE-F PO)   Oral   Take 1 capsule by mouth daily.         . hydrochlorothiazide (HYDRODIURIL) 50 MG tablet   Oral   Take 50 mg by mouth daily.         . Menthol, Topical Analgesic, (BIOFREEZE ROLL-ON COLORLESS EX)   Apply externally   Apply 1 application topically as needed (for pain).         . Multiple Vitamins-Minerals (MULTIVITAMIN WITH MINERALS) tablet   Oral   Take 1 tablet by mouth daily.         Marland Kitchen oxyCODONE-acetaminophen (PERCOCET/ROXICET) 5-325 MG per tablet   Oral   Take 1 tablet by mouth every 6 (six) hours as needed for severe pain.   15 tablet   0   . pantoprazole (PROTONIX) 40 MG tablet   Oral   Take 40 mg by mouth 2 (two) times daily.         . sucralfate (CARAFATE) 1 G tablet   Oral   Take 1 g by mouth 4 (four) times daily.         Marland Kitchen tiZANidine (ZANAFLEX) 4 MG tablet   Oral   Take 4-8 mg by mouth 2 (two) times daily. Takes 1 tablet in the morning and 2 tablets in the evening          BP 149/106  Pulse 99  Temp(Src) 98.2 F (36.8 C)  (Oral)  Resp 16  SpO2 94%  Filed Vitals:   06/01/13 1241 06/01/13 1441 06/01/13 1858  BP: 149/106 134/92 136/83  Pulse: 99 80 79  Temp: 98.2 F (36.8 C) 98.4 F (36.9 C) 98.6 F (37 C)  TempSrc: Oral  Oral  Resp: 16 16 16   SpO2: 94% 95% 97%    Physical Exam  Nursing note and vitals reviewed. Constitutional: She is oriented to person, place, and time. She appears well-developed  and well-nourished. No distress.  Tearful  HENT:  Head: Normocephalic and atraumatic.  Right Ear: External ear normal.  Left Ear: External ear normal.  Nose: Nose normal.  Mouth/Throat: Oropharynx is clear and moist. No oropharyngeal exudate.  Eyes: Conjunctivae are normal. Pupils are equal, round, and reactive to light. Right eye exhibits no discharge. Left eye exhibits no discharge.  Neck: Normal range of motion. Neck supple.  Cardiovascular: Normal rate, regular rhythm, normal heart sounds and intact distal pulses.  Exam reveals no gallop and no friction rub.   No murmur heard. Pulmonary/Chest: Effort normal and breath sounds normal. No respiratory distress. She has no wheezes. She has no rales. She exhibits no tenderness.  Abdominal: Soft. Bowel sounds are normal. She exhibits no distension and no mass. There is tenderness. There is no rebound and no guarding.  LLQ tenderness   Musculoskeletal: Normal range of motion. She exhibits no edema and no tenderness.  No left hip tenderness. No lumbar, CVA, or flank tenderness bilaterally. Negative straight leg raise. Strength 5/5 in the lower extremities bilaterally. Patient able to ambulate without difficulty or ataxia.   Neurological: She is alert and oriented to person, place, and time.  Skin: Skin is warm and dry. She is not diaphoretic.    ED Course  Procedures (including critical care time) Labs Review Labs Reviewed  CBC WITH DIFFERENTIAL  COMPREHENSIVE METABOLIC PANEL  LIPASE, BLOOD  URINALYSIS, ROUTINE W REFLEX MICROSCOPIC   Imaging  Review No results found.   EKG Interpretation None      Results for orders placed during the hospital encounter of 06/01/13  WET PREP, GENITAL      Result Value Ref Range   Yeast Wet Prep HPF POC NONE SEEN  NONE SEEN   Trich, Wet Prep NONE SEEN  NONE SEEN   Clue Cells Wet Prep HPF POC MODERATE (*) NONE SEEN   WBC, Wet Prep HPF POC FEW (*) NONE SEEN  CBC WITH DIFFERENTIAL      Result Value Ref Range   WBC 5.5  4.0 - 10.5 K/uL   RBC 4.96  3.87 - 5.11 MIL/uL   Hemoglobin 13.9  12.0 - 15.0 g/dL   HCT 78.442.5  69.636.0 - 29.546.0 %   MCV 85.7  78.0 - 100.0 fL   MCH 28.0  26.0 - 34.0 pg   MCHC 32.7  30.0 - 36.0 g/dL   RDW 28.413.9  13.211.5 - 44.015.5 %   Platelets 341  150 - 400 K/uL   Neutrophils Relative % 53  43 - 77 %   Neutro Abs 2.9  1.7 - 7.7 K/uL   Lymphocytes Relative 36  12 - 46 %   Lymphs Abs 2.0  0.7 - 4.0 K/uL   Monocytes Relative 9  3 - 12 %   Monocytes Absolute 0.5  0.1 - 1.0 K/uL   Eosinophils Relative 1  0 - 5 %   Eosinophils Absolute 0.1  0.0 - 0.7 K/uL   Basophils Relative 0  0 - 1 %   Basophils Absolute 0.0  0.0 - 0.1 K/uL  COMPREHENSIVE METABOLIC PANEL      Result Value Ref Range   Sodium 138  137 - 147 mEq/L   Potassium 4.4  3.7 - 5.3 mEq/L   Chloride 97  96 - 112 mEq/L   CO2 26  19 - 32 mEq/L   Glucose, Bld 101 (*) 70 - 99 mg/dL   BUN 16  6 - 23 mg/dL   Creatinine, Ser  0.74  0.50 - 1.10 mg/dL   Calcium 9.8  8.4 - 16.1 mg/dL   Total Protein 8.2  6.0 - 8.3 g/dL   Albumin 4.2  3.5 - 5.2 g/dL   AST 45 (*) 0 - 37 U/L   ALT 54 (*) 0 - 35 U/L   Alkaline Phosphatase 82  39 - 117 U/L   Total Bilirubin 0.4  0.3 - 1.2 mg/dL   GFR calc non Af Amer >90  >90 mL/min   GFR calc Af Amer >90  >90 mL/min  LIPASE, BLOOD      Result Value Ref Range   Lipase 17  11 - 59 U/L  URINALYSIS, ROUTINE W REFLEX MICROSCOPIC      Result Value Ref Range   Color, Urine YELLOW  YELLOW   APPearance CLEAR  CLEAR   Specific Gravity, Urine 1.026  1.005 - 1.030   pH 7.0  5.0 - 8.0   Glucose, UA  NEGATIVE  NEGATIVE mg/dL   Hgb urine dipstick NEGATIVE  NEGATIVE   Bilirubin Urine NEGATIVE  NEGATIVE   Ketones, ur NEGATIVE  NEGATIVE mg/dL   Protein, ur NEGATIVE  NEGATIVE mg/dL   Urobilinogen, UA 0.2  0.0 - 1.0 mg/dL   Nitrite NEGATIVE  NEGATIVE   Leukocytes, UA NEGATIVE  NEGATIVE     MDM   Carly Delacruz is a 48 y.o. female with a PMH of endometriosis s/p TAH and oophorectomy, gallstones s/p cholecystectomy, morbid obesity status post gastric bypass, yeast infection, trichomonas, UTI, atrophic vaginitis, BV, valvular lesion, liver mass, GERD, sleep apnea, HTN and arthritis who presents to the ED for evaluation of abdominal pain.   Rechecks  3:35 PM = Patient talking on the phone when I entered the room. When asked about pain, patient starts crying stating her pain is still a 10/10. Ordering another 1 mg dilaudid.  4:44 PM = Pain 10/10. Not improved after dilaudid. Ordering Toradol. Pelvic exam at bedside revealed significant tenderness on the left and middle vaginal vault. Patient tearful. Scar tissue present in place of previous cervix. Minimal thin white discharge present in the vaginal vault. No blood.     Etiology of LLQ abdominal pain unclear. Possibly due to adhesions from previous abdominal surgeries. Patient has had unchanged chronic abdominal pain since 04/2013. Patient had negative CT, colonoscopy, and left hip x-rays by GI and orthopedics. Patient has EGD scheduled on 06/04/13. Pelvic exam revealed left sided tenderness. Patient has hx of TAH with bilateral oophorectomy. Signed out care to Felicie Morn NP to await pelvic US results and wet mount. UA negative for UTI. Labs revealed elevated liver enzymes which appears to be chronic. Labs otherwise unremarkable. Vital signs stable. Patient afebrile and non-toxic in appearance. Will likely discharge with close follow-up from GI, PCP, and OB/GYN.     Greer Ee Roux Brandy PA-C          Jillyn Ledger, PA-C 06/02/13  [phone]

## 2013-06-01 NOTE — ED Provider Notes (Signed)
Lab and radiology results reviewed, shared with patient.  Clue cells present in wet prep, will cover for bacterial vaginosis with flagyl.  Pelvic ultrasound unremarkable for acute processes.  Discharge home with percocet 10/325 and follow-up with GI as scheduled on Monday.  Jimmye Normanavid John Myrtha Tonkovich, NP 06/01/13 1904

## 2013-06-01 NOTE — ED Notes (Addendum)
Pt reports she was seen in ED several weeks ago for same pain, left sided abdominal/ groin pain. Pt was discharged and told to follow up with GI, Dr Velora HecklerPurtle. Pt had colonoscopy 3/26, results came back that colon looked okay, no polyps. Pt was told to follow up with orthopedist for chronic back pain. Orthopedist believed pt had bursitis and given pain injection. And given prescription for zanaflex. Pt has not taken any kind of pain medication since last week. Pt reports she was taking oxycodone she had no relief. At present pain 8/10. Hx of HTN.

## 2013-06-02 LAB — GC/CHLAMYDIA PROBE AMP
CT PROBE, AMP APTIMA: NEGATIVE
GC Probe RNA: NEGATIVE

## 2013-06-02 NOTE — ED Provider Notes (Signed)
Medical screening examination/treatment/procedure(s) were performed by non-physician practitioner and as supervising physician I was immediately available for consultation/collaboration.    Shanna CiscoMegan E Docherty, MD 06/02/13 0000

## 2013-06-04 ENCOUNTER — Ambulatory Visit (AMBULATORY_SURGERY_CENTER): Payer: 59 | Admitting: Internal Medicine

## 2013-06-04 ENCOUNTER — Encounter: Payer: Self-pay | Admitting: Internal Medicine

## 2013-06-04 VITALS — BP 125/73 | HR 66 | Temp 97.8°F | Resp 25 | Ht 63.39 in | Wt 247.0 lb

## 2013-06-04 DIAGNOSIS — R6881 Early satiety: Secondary | ICD-10-CM

## 2013-06-04 DIAGNOSIS — Z9884 Bariatric surgery status: Secondary | ICD-10-CM

## 2013-06-04 DIAGNOSIS — R1032 Left lower quadrant pain: Secondary | ICD-10-CM

## 2013-06-04 DIAGNOSIS — R110 Nausea: Secondary | ICD-10-CM

## 2013-06-04 MED ORDER — SODIUM CHLORIDE 0.9 % IV SOLN: 500.0000 mL | INTRAVENOUS | Status: DC

## 2013-06-04 NOTE — Op Note (Signed)
[facility] [address] [place], [id number]   ENDOSCOPY PROCEDURE REPORT  PATIENT: Carly Delacruz, Carly J.  MR#: [id number] BIRTHDATE: 11-29-1965 , 47  yrs. old GENDER: Female ENDOSCOPIST: Beverley FiedlerJay M Pyrtle, MD PROCEDURE DATE:  06/04/2013 PROCEDURE:  EGD, diagnostic ASA CLASS:     Class III INDICATIONS:  Nausea.  Early satiety.  hx of gastric bypass MEDICATIONS: MAC sedation, administered by CRNA and propofol (Diprivan) 100mg  IV TOPICAL ANESTHETIC: none  DESCRIPTION OF PROCEDURE: After the risks benefits and alternatives of the procedure were thoroughly explained, informed consent was obtained.  The LB [id number] endoscope was introduced through the mouth and advanced to the proximal jejunum. Without limitations.  The instrument was slowly withdrawn as the mucosa was fully examined.     ESOPHAGUS: The mucosa of the esophagus appeared normal. Regular Z-line  STOMACH: A Roux -en-Y anastomosis was found characterized as healthy in appearance and was widely patent.  There were visible staples. No evidence of gastritis.  JEJUNUM: The exam showed no abnormalities in the efferent jejunal limb.  Retroflexed views revealed no abnormalities.     The scope was then withdrawn from the patient and the procedure completed.  COMPLICATIONS: There were no complications.  ENDOSCOPIC IMPRESSION: 1.   The mucosa of the esophagus appeared normal 2.   Roux -en-Y anastomosis was found; healthy appearing and widely patent 3.   Normal examined portions of the efferent jejunal limb  RECOMMENDATIONS: 1.  Continue current medications 2.  Follow-up with GYN for persistent pelvic pain   eSigned:  Beverley FiedlerJay M Pyrtle, MD 06/04/2013 2:09 PM   CC:The Patient and Elby Showersatherine Walsh, MD; Dr. Jaymes GraffNaima Dillard, MD, Dr. Yvetta CoderIbthehal Jaralla [facility]

## 2013-06-04 NOTE — Progress Notes (Signed)
Pt has adhesive allergy- paper tape used today at IV site. Maw

## 2013-06-04 NOTE — ED Provider Notes (Signed)
Medical screening examination/treatment/procedure(s) were performed by non-physician practitioner and as supervising physician I was immediately available for consultation/collaboration.   EKG Interpretation None       Tashari Schoenfelder, MD 06/04/13 0844 

## 2013-06-04 NOTE — Patient Instructions (Addendum)

## 2013-06-04 NOTE — Progress Notes (Signed)
Stable to RR 

## 2013-06-05 ENCOUNTER — Telehealth: Payer: Self-pay | Admitting: *Deleted

## 2013-06-05 NOTE — Telephone Encounter (Signed)
  Follow up Call-  Call back number 06/04/2013 05/24/2013  Post procedure Call Back phone  # [phone] [phone]  Permission to leave phone message Yes Yes     Patient questions:  Do you have a fever, pain , or abdominal swelling? no Pain Score  0 *  Have you tolerated food without any problems? yes  Have you been able to return to your normal activities? yes  Do you have any questions about your discharge instructions: Diet   no Medications  no Follow up visit  no  Do you have questions or concerns about your Care? no  Actions: * If pain score is 4 or above: No action needed, pain <4.  Pt states she had pain before the procedure.  No increasing pain since then

## 2013-06-12 ENCOUNTER — Telehealth: Payer: Self-pay | Admitting: Internal Medicine

## 2013-06-12 NOTE — Telephone Encounter (Signed)
Will await paperwork from health port

## 2013-06-12 NOTE — Telephone Encounter (Signed)
See information about FMLA

## 2013-06-15 ENCOUNTER — Telehealth: Payer: Self-pay | Admitting: Internal Medicine

## 2013-06-15 NOTE — Telephone Encounter (Signed)
I did not take her out, but have been working with her given her ongoing back. I am okay for back to work note, which is good to hear she is feeling better

## 2013-06-15 NOTE — Telephone Encounter (Signed)
Patient requesting a return to work note for 06/19/13.  Did you take her out of work?

## 2013-06-15 NOTE — Telephone Encounter (Signed)
At the patient's request I have faxed the results to [phone]

## 2013-06-18 ENCOUNTER — Telehealth: Payer: Self-pay | Admitting: Internal Medicine

## 2013-06-18 NOTE — Telephone Encounter (Signed)
Certainly could be possible, because we have not found a GI etiology and ortho/spine injected for bursitis without relief Okay with urology referral for lower abd/LLQ pain and eval for interstitial cystitis

## 2013-06-18 NOTE — Telephone Encounter (Signed)
Patient wonders if she could have interstitial cystitis? She is requesting a referral to Urology if you think this could be her problem

## 2013-06-18 NOTE — Telephone Encounter (Signed)
Patient will see Dr. Annabell HowellsWrenn today at [facility] I have faxed records to [phone].  Patient aware of the appt

## 2013-06-20 ENCOUNTER — Other Ambulatory Visit: Payer: Self-pay | Admitting: Urology

## 2013-06-20 DIAGNOSIS — R52 Pain, unspecified: Secondary | ICD-10-CM

## 2013-06-25 ENCOUNTER — Ambulatory Visit (HOSPITAL_COMMUNITY)
Admission: RE | Admit: 2013-06-25 | Discharge: 2013-06-25 | Disposition: A | Discharge status: Home / Self Care | Payer: 59 | Source: Ambulatory Visit | Attending: Urology | Admitting: Urology

## 2013-06-25 DIAGNOSIS — R52 Pain, unspecified: Secondary | ICD-10-CM

## 2013-06-25 DIAGNOSIS — Z90710 Acquired absence of both cervix and uterus: Secondary | ICD-10-CM | POA: Insufficient documentation

## 2013-06-25 DIAGNOSIS — Z9884 Bariatric surgery status: Secondary | ICD-10-CM | POA: Insufficient documentation

## 2013-06-25 DIAGNOSIS — N949 Unspecified condition associated with female genital organs and menstrual cycle: Secondary | ICD-10-CM | POA: Insufficient documentation

## 2013-06-25 MED ORDER — GADOBENATE DIMEGLUMINE 529 MG/ML IV SOLN
20.0000 mL | Freq: Once | INTRAVENOUS | Status: AC | PRN
Start: 2013-06-25 — End: 2013-06-25
  Administered 2013-06-25: 20 mL via INTRAVENOUS

## 2013-07-04 ENCOUNTER — Encounter (INDEPENDENT_AMBULATORY_CARE_PROVIDER_SITE_OTHER): Payer: Self-pay | Admitting: Surgery

## 2013-07-04 ENCOUNTER — Telehealth: Payer: Self-pay | Admitting: Internal Medicine

## 2013-07-04 NOTE — Telephone Encounter (Signed)
Dr. Rhea BeltonPyrtle, do you have time to come by and sign the paperwork?  It is in your office.

## 2013-07-10 NOTE — Telephone Encounter (Signed)
Forms being returned to health port

## 2013-07-17 NOTE — Telephone Encounter (Signed)
This has been taken care of.

## 2013-07-18 ENCOUNTER — Telehealth: Payer: Self-pay | Admitting: Internal Medicine

## 2013-07-18 NOTE — Telephone Encounter (Signed)
Patient wanted to [facility].  She will call back for any questions or concerns

## 2013-08-02 ENCOUNTER — Ambulatory Visit (INDEPENDENT_AMBULATORY_CARE_PROVIDER_SITE_OTHER): Payer: 59 | Admitting: Podiatry

## 2013-08-02 ENCOUNTER — Ambulatory Visit (INDEPENDENT_AMBULATORY_CARE_PROVIDER_SITE_OTHER): Payer: 59

## 2013-08-02 ENCOUNTER — Encounter: Payer: Self-pay | Admitting: Podiatry

## 2013-08-02 VITALS — BP 138/88 | HR 88 | Resp 16

## 2013-08-02 DIAGNOSIS — M722 Plantar fascial fibromatosis: Secondary | ICD-10-CM

## 2013-08-02 DIAGNOSIS — M7750 Other enthesopathy of unspecified foot: Secondary | ICD-10-CM

## 2013-08-02 DIAGNOSIS — M21619 Bunion of unspecified foot: Secondary | ICD-10-CM

## 2013-08-02 MED ORDER — TRIAMCINOLONE ACETONIDE 10 MG/ML IJ SUSP
10.0000 mg | Freq: Once | INTRAMUSCULAR | Status: AC
  Administered 2013-08-02: 10 mg

## 2013-08-02 NOTE — Progress Notes (Signed)
   Subjective:    Patient ID: Carly Delacruz, female    DOB: 10/11/1965, 48 y.o.   MRN: [id number]  HPI Comments: My left heel and ankle has been hurting. Its been hurting for 1.5 weeks. It hurts in the mornings when i get up. It hurts to walk and stand. The pain has gotten worse. i use biofreeze, elevate, and soaking in warm water.  Foot Pain Associated symptoms include abdominal pain and fatigue.      Review of Systems  Constitutional: Positive for activity change, fatigue and unexpected weight change.  HENT: Positive for sinus pressure and sneezing.   Eyes: Positive for itching.  Gastrointestinal: Positive for abdominal pain.       Bloating  Genitourinary: Positive for frequency.  Musculoskeletal: Positive for back pain.       Joint pain Difficulty walking Muscle pain  All other systems reviewed and are negative.      Objective:   Physical Exam        Assessment & Plan:

## 2013-08-03 NOTE — Progress Notes (Signed)
Subjective:     Patient ID: Carly Delacruz, female   DOB: [birth year], 48 y.o.   MRN: [id number]  Foot Pain   patient presents stating I'm having a lot of pain in my left heel and also the outside of the ankle and it's becoming increasingly difficult to walk on. Been hurting badly for a couple weeks   Review of Systems  All other systems reviewed and are negative.      Objective:   Physical Exam  Nursing note and vitals reviewed. Constitutional: She is oriented to person, place, and time.  Cardiovascular: Intact distal pulses.   Musculoskeletal: Normal range of motion.  Neurological: She is oriented to person, place, and time.  Skin: Skin is warm.   neurovascular status found to be intact with patient having reduced range of motion subtalar midtarsal joint left side do to splinting and found to have normal muscle strength. Patient is noted to have edema in discomfort in the lateral side of the left ankle under the malleolus and in the lateral plantar heel and is noted to have good Perfusion both feet and is noted to have a diminishment of the arch on weightbearing     Assessment:    what appears to be peroneal tendinitis with mild lateral plantar fasciitis with inflammation and pain    Plan:     H&P and x-rays reviewed and did careful injection lateral side the left ankle 3 mg Kenalog 5 mg Xylocaine Marcaine mixture and advised him reduced activity for the next several days and reappoint

## 2013-08-09 ENCOUNTER — Ambulatory Visit: Payer: 59 | Admitting: Podiatry

## 2013-08-16 ENCOUNTER — Ambulatory Visit (INDEPENDENT_AMBULATORY_CARE_PROVIDER_SITE_OTHER): Payer: 59 | Admitting: Podiatry

## 2013-08-16 ENCOUNTER — Encounter: Payer: Self-pay | Admitting: Podiatry

## 2013-08-16 VITALS — Ht 63.0 in | Wt 247.0 lb

## 2013-08-16 DIAGNOSIS — M722 Plantar fascial fibromatosis: Secondary | ICD-10-CM

## 2013-08-16 DIAGNOSIS — M7750 Other enthesopathy of unspecified foot: Secondary | ICD-10-CM

## 2013-08-16 MED ORDER — TRIAMCINOLONE ACETONIDE 10 MG/ML IJ SUSP
10.0000 mg | Freq: Once | INTRAMUSCULAR | Status: AC
  Administered 2013-08-16: 10 mg

## 2013-08-17 NOTE — Progress Notes (Signed)
Subjective:     Patient ID: Carly Delacruz, female   DOB: 04/09/1965, 48 y.o.   MRN: [id number]  HPI patient continues to experience discomfort in the plantar aspect of the left heel with inflammation noted upon analysis   Review of Systems     Objective:   Physical Exam Neurovascular status intact with inflammation and fluid around the medial band left heel at the insertional point to the calcaneus    Assessment:     Acute plantar fasciitis left still present with some response to treatment of last week    Plan:     Reinjected the plantar fascia 3 mg Kenalog 5 mg I can Marcaine mixture and dispensed a night splint and ice packs with all instructions on usage. Reappoint her recheck in 2 weeks

## 2013-08-30 ENCOUNTER — Encounter (HOSPITAL_COMMUNITY): Payer: Self-pay | Admitting: Emergency Medicine

## 2013-08-30 ENCOUNTER — Emergency Department (HOSPITAL_COMMUNITY)
Admission: EM | Admit: 2013-08-30 | Discharge: 2013-08-30 | Disposition: A | Discharge status: Home / Self Care | Payer: 59 | Attending: Emergency Medicine | Admitting: Emergency Medicine

## 2013-08-30 ENCOUNTER — Ambulatory Visit: Payer: 59 | Admitting: Podiatry

## 2013-08-30 DIAGNOSIS — M810 Age-related osteoporosis without current pathological fracture: Secondary | ICD-10-CM | POA: Insufficient documentation

## 2013-08-30 DIAGNOSIS — G8929 Other chronic pain: Secondary | ICD-10-CM | POA: Insufficient documentation

## 2013-08-30 DIAGNOSIS — Z8619 Personal history of other infectious and parasitic diseases: Secondary | ICD-10-CM | POA: Insufficient documentation

## 2013-08-30 DIAGNOSIS — I10 Essential (primary) hypertension: Secondary | ICD-10-CM | POA: Insufficient documentation

## 2013-08-30 DIAGNOSIS — K219 Gastro-esophageal reflux disease without esophagitis: Secondary | ICD-10-CM | POA: Insufficient documentation

## 2013-08-30 DIAGNOSIS — R109 Unspecified abdominal pain: Secondary | ICD-10-CM | POA: Insufficient documentation

## 2013-08-30 DIAGNOSIS — R112 Nausea with vomiting, unspecified: Secondary | ICD-10-CM | POA: Insufficient documentation

## 2013-08-30 DIAGNOSIS — R197 Diarrhea, unspecified: Secondary | ICD-10-CM | POA: Insufficient documentation

## 2013-08-30 DIAGNOSIS — Z8719 Personal history of other diseases of the digestive system: Secondary | ICD-10-CM | POA: Insufficient documentation

## 2013-08-30 DIAGNOSIS — Z862 Personal history of diseases of the blood and blood-forming organs and certain disorders involving the immune mechanism: Secondary | ICD-10-CM | POA: Insufficient documentation

## 2013-08-30 DIAGNOSIS — Z8742 Personal history of other diseases of the female genital tract: Secondary | ICD-10-CM | POA: Insufficient documentation

## 2013-08-30 DIAGNOSIS — Z79899 Other long term (current) drug therapy: Secondary | ICD-10-CM | POA: Insufficient documentation

## 2013-08-30 LAB — CBC WITH DIFFERENTIAL/PLATELET
Basophils Absolute: 0 10*3/uL (ref 0.0–0.1)
Basophils Relative: 0 % (ref 0–1)
EOS ABS: 0 10*3/uL (ref 0.0–0.7)
Eosinophils Relative: 0 % (ref 0–5)
HCT: 45.1 % (ref 36.0–46.0)
Hemoglobin: 14.9 g/dL (ref 12.0–15.0)
LYMPHS PCT: 28 % (ref 12–46)
Lymphs Abs: 1.4 10*3/uL (ref 0.7–4.0)
MCH: 28.1 pg (ref 26.0–34.0)
MCHC: 33 g/dL (ref 30.0–36.0)
MCV: 84.9 fL (ref 78.0–100.0)
Monocytes Absolute: 0.4 10*3/uL (ref 0.1–1.0)
Monocytes Relative: 8 % (ref 3–12)
NEUTROS PCT: 64 % (ref 43–77)
Neutro Abs: 3.2 10*3/uL (ref 1.7–7.7)
PLATELETS: 299 10*3/uL (ref 150–400)
RBC: 5.31 MIL/uL — AB (ref 3.87–5.11)
RDW: 13.1 % (ref 11.5–15.5)
WBC: 5.1 10*3/uL (ref 4.0–10.5)

## 2013-08-30 LAB — URINALYSIS, ROUTINE W REFLEX MICROSCOPIC
GLUCOSE, UA: NEGATIVE mg/dL
Ketones, ur: 15 mg/dL — AB
Leukocytes, UA: NEGATIVE
Nitrite: NEGATIVE
PROTEIN: NEGATIVE mg/dL
SPECIFIC GRAVITY, URINE: 1.036 — AB (ref 1.005–1.030)
Urobilinogen, UA: 0.2 mg/dL (ref 0.0–1.0)
pH: 5.5 (ref 5.0–8.0)

## 2013-08-30 LAB — URINE MICROSCOPIC-ADD ON

## 2013-08-30 LAB — COMPREHENSIVE METABOLIC PANEL
ALT: 21 U/L (ref 0–35)
AST: 22 U/L (ref 0–37)
Albumin: 4.4 g/dL (ref 3.5–5.2)
Alkaline Phosphatase: 97 U/L (ref 39–117)
Anion gap: 15 (ref 5–15)
BUN: 14 mg/dL (ref 6–23)
CO2: 25 mEq/L (ref 19–32)
Calcium: 9.7 mg/dL (ref 8.4–10.5)
Chloride: 93 mEq/L — ABNORMAL LOW (ref 96–112)
Creatinine, Ser: 0.9 mg/dL (ref 0.50–1.10)
GFR calc non Af Amer: 73 mL/min — ABNORMAL LOW (ref >90)
GFR, EST AFRICAN AMERICAN: 85 mL/min — AB (ref >90)
GLUCOSE: 112 mg/dL — AB (ref 70–99)
POTASSIUM: 4 meq/L (ref 3.7–5.3)
Sodium: 133 mEq/L — ABNORMAL LOW (ref 137–147)
TOTAL PROTEIN: 8 g/dL (ref 6.0–8.3)
Total Bilirubin: 0.5 mg/dL (ref 0.3–1.2)

## 2013-08-30 LAB — LIPASE, BLOOD: Lipase: 16 U/L (ref 11–59)

## 2013-08-30 MED ORDER — ONDANSETRON HCL 4 MG/2ML IJ SOLN
4.0000 mg | Freq: Once | INTRAMUSCULAR | Status: AC
  Administered 2013-08-30: 4 mg via INTRAVENOUS
  Filled 2013-08-30: qty 2

## 2013-08-30 MED ORDER — HYDROMORPHONE HCL PF 1 MG/ML IJ SOLN
0.5000 mg | Freq: Once | INTRAMUSCULAR | Status: AC
  Administered 2013-08-30: 0.5 mg via INTRAVENOUS
  Filled 2013-08-30: qty 1

## 2013-08-30 MED ORDER — HYDROMORPHONE HCL PF 1 MG/ML IJ SOLN
1.0000 mg | Freq: Once | INTRAMUSCULAR | Status: AC
  Administered 2013-08-30: 1 mg via INTRAVENOUS
  Filled 2013-08-30: qty 1

## 2013-08-30 MED ORDER — PANTOPRAZOLE SODIUM 40 MG IV SOLR
40.0000 mg | Freq: Once | INTRAVENOUS | Status: AC
  Administered 2013-08-30: 40 mg via INTRAVENOUS
  Filled 2013-08-30: qty 40

## 2013-08-30 MED ORDER — DIPHENHYDRAMINE HCL 50 MG/ML IJ SOLN
25.0000 mg | Freq: Once | INTRAMUSCULAR | Status: AC
  Administered 2013-08-30: 25 mg via INTRAVENOUS
  Filled 2013-08-30: qty 1

## 2013-08-30 MED ORDER — SODIUM CHLORIDE 0.9 % IV BOLUS (SEPSIS)
1000.0000 mL | Freq: Once | INTRAVENOUS | Status: AC
  Administered 2013-08-30: 1000 mL via INTRAVENOUS

## 2013-08-30 MED ORDER — SODIUM CHLORIDE 0.9 % IV BOLUS (SEPSIS)
500.0000 mL | Freq: Once | INTRAVENOUS | Status: AC
  Administered 2013-08-30: 500 mL via INTRAVENOUS

## 2013-08-30 MED ORDER — ONDANSETRON 8 MG PO TBDP: 8.0000 mg | ORAL_TABLET | Freq: Three times a day (TID) | ORAL | Status: DC | PRN

## 2013-08-30 MED ORDER — HYDROCODONE-ACETAMINOPHEN 5-325 MG PO TABS: 1.0000 | ORAL_TABLET | Freq: Four times a day (QID) | ORAL | Status: DC | PRN

## 2013-08-30 NOTE — Discharge Instructions (Signed)
You may take hydrocodone as need for pain. No driving for the next 6 hours or when taking hydrocodone. Also, do not take tylenol or acetaminophen containing medication when taking hydrocodone. Take zofran as need for nausea. Follow up with your doctor/gi doctor in the next few days for recheck - call their office to make those follow up appointments. Return to ER if worse, new symptoms, fevers, persistent vomiting, other concern.  You were given pain medication in the ER - no driving for the next 6 hours.     Abdominal Pain Many things can cause abdominal pain. Usually, abdominal pain is not caused by a disease and will improve without treatment. It can often be observed and treated at home. Your health care provider will do a physical exam and possibly order blood tests and X-rays to help determine the seriousness of your pain. However, in many cases, more time must pass before a clear cause of the pain can be found. Before that point, your health care provider may not know if you need more testing or further treatment. HOME CARE INSTRUCTIONS  Monitor your abdominal pain for any changes. The following actions may help to alleviate any discomfort you are experiencing:  Only take over-the-counter or prescription medicines as directed by your health care provider.  Do not take laxatives unless directed to do so by your health care provider.  Try a clear liquid diet (broth, tea, or water) as directed by your health care provider. Slowly move to a bland diet as tolerated. SEEK MEDICAL CARE IF:  You have unexplained abdominal pain.  You have abdominal pain associated with nausea or diarrhea.  You have pain when you urinate or have a bowel movement.  You experience abdominal pain that wakes you in the night.  You have abdominal pain that is worsened or improved by eating food.  You have abdominal pain that is worsened with eating fatty foods.  You have a fever. SEEK IMMEDIATE MEDICAL  CARE IF:   Your pain does not go away within 2 hours.  You keep throwing up (vomiting).  Your pain is felt only in portions of the abdomen, such as the right side or the left lower portion of the abdomen.  You pass bloody or black tarry stools. MAKE SURE YOU:  Understand these instructions.   Will watch your condition.   Will get help right away if you are not doing well or get worse.  Document Released: 11/25/2004 Document Revised: 02/20/2013 Document Reviewed: 10/25/2012 [facility] Patient Information 2015 [employer] [place]. This information is not intended to replace advice given to you by your health care provider. Make sure you discuss any questions you have with your health care provider.

## 2013-08-30 NOTE — ED Provider Notes (Addendum)
CSN: [id number]     Arrival date & time 08/30/13  0810 History   First MD Initiated Contact with Patient 08/30/13 [phone]     Chief Complaint  Patient presents with  . Emesis  . Abdominal Pain     (Consider location/radiation/quality/duration/timing/severity/associated sxs/prior Treatment) Patient is a 48 y.o. female presenting with vomiting and abdominal pain. The history is provided by the patient.  Emesis Associated symptoms: abdominal pain and diarrhea   Associated symptoms: no chills, no headaches and no sore throat   Abdominal Pain Associated symptoms: diarrhea, nausea and vomiting   Associated symptoms: no chest pain, no chills, no cough, no dysuria, no fever, no shortness of breath and no sore throat   pt w hx gastric bypass 2007, cholecystectomy, hysterectomy, c/o nausea/dry heaves in the past 1-2 days, also has had a few diarrheal stools, watery, not bloody.  Pt states this has caused her abdominal pain to flare up.  Pt c/o right upper quadrant pain and pain across lower abdomen bil.  Pt w hx recurrent ruq pain even after cholecystectomy, without clear cause.   Pt also states she has been diagnosed with chronic pelvic pain/'pelvic floor pain syndrome', and she feels her dry heaves and diarrhea has caused that to flare up as well.  Denies fever or chills. No bilious emesis. No abd distension. No dysuria or gu c/o. No vaginal discharge or bleeding. No back/flank pain. No cp or sob.      Past Medical History  Diagnosis Date  . Fatigue 2012  . Malaise 2012  . Insomnia   . Endometriosis   . Sleep apnea 2005  . Morbid obesity 2005  . Yeast infection   . Trichomonas   . Complication of anesthesia   . H/O varicella   . H/O mumps   . H/O bladder infections   . Insomnia secondary to situational depression 06/06/07  . Atrophic vaginitis 2009  . BV (bacterial vaginosis) 2006  . Liver mass, right lobe 08/13/04    Nodule  . Arthritis 12/10/03  . SUI (stress urinary incontinence,  female) 08/12/03  . History of candidal vulvovaginitis 03/21/03  . Vulvar lesion 03/21/03  . Anemia   . Gallstones   . GERD (gastroesophageal reflux disease)   . HTN (hypertension)   . Allergy   . Osteoporosis   . S/P gastric bypass   . Chronic back pain   . Chronic LLQ pain   . Elevated LFTs    Past Surgical History  Procedure Laterality Date  . Wisdom tooth extraction    . Appendectomy  2001  . Tonsillectomy    . Cholecystectomy  2011  . Abdominal hysterectomy  2001  . Gastric bypass  2007    [facility], Dr. Daphine Deutscher  . Laparotomy    . Dilation and curettage of uterus    . Knee arthroscopy Left    Family History  Problem Relation Age of Onset  . Colitis Sister   . Diabetes Paternal Grandmother   . Leukemia Maternal Grandmother   . Arthritis Mother   . Prostate cancer Father    History  Substance Use Topics  . Smoking status: Never Smoker   . Smokeless tobacco: Never Used  . Alcohol Use: No   OB History   Grav Para Term Preterm Abortions TAB SAB Ect Mult Living   0 0             Review of Systems  Constitutional: Negative for fever and chills.  HENT: Negative  for sore throat.   Eyes: Negative for redness.  Respiratory: Negative for cough and shortness of breath.   Cardiovascular: Negative for chest pain.  Gastrointestinal: Positive for nausea, vomiting, abdominal pain and diarrhea.  Genitourinary: Negative for dysuria and flank pain.  Musculoskeletal: Negative for back pain and neck pain.  Skin: Negative for rash.  Neurological: Negative for headaches.  Hematological: Does not bruise/bleed easily.  Psychiatric/Behavioral: Negative for confusion.      Allergies  Morphine and related; Septra; Sulfa antibiotics; Ultram; Ciprofloxacin hcl; Codeine; Darvocet; Demerol; Dilaudid; and Adhesive  Home Medications   Prior to Admission medications   Medication Sig Start Date End Date Taking? Authorizing Provider  buPROPion (WELLBUTRIN XL) 150 MG 24  hr tablet Take 150 mg by mouth every morning.     Historical Provider, MD  CALCIUM PO Take 1 tablet by mouth every morning.     Historical Provider, MD  Cyanocobalamin (VITAMIN B-12 PO) Take 1 tablet by mouth every morning.     Historical Provider, MD  diphenhydrAMINE (BENADRYL) 25 MG tablet Take 25 mg by mouth every 6 (six) hours as needed for allergies.    Historical Provider, MD  Fe Fum-FePoly-FA-Vit C-Vit B3 (FOLIVANE-F PO) Take 1 capsule by mouth every morning.     Historical Provider, MD  hydrochlorothiazide (HYDRODIURIL) 50 MG tablet Take 50 mg by mouth every morning.     Historical Provider, MD  Menthol, Topical Analgesic, (BIOFREEZE ROLL-ON COLORLESS EX) Apply 1 application topically daily as needed (For pain.).     Historical Provider, MD  metroNIDAZOLE (FLAGYL) 500 MG tablet Take 1 tablet (500 mg total) by mouth 2 (two) times daily. 06/01/13   Jimmye Norman, NP  Multiple Vitamins-Minerals (MULTIVITAMIN WITH MINERALS) tablet Take 1 tablet by mouth every morning.     Historical Provider, MD  oxyCODONE-acetaminophen (PERCOCET) 10-325 MG per tablet Take 1 tablet by mouth every 6 (six) hours as needed for pain. 06/01/13   Jimmye Norman, NP  oxyCODONE-acetaminophen (PERCOCET/ROXICET) 5-325 MG per tablet Take 1 tablet by mouth every 6 (six) hours as needed for severe pain. 05/22/13   Beverley Fiedler, MD  pantoprazole (PROTONIX) 40 MG tablet Take 40 mg by mouth 2 (two) times daily.    Historical Provider, MD  sucralfate (CARAFATE) 1 G tablet Take 1 g by mouth 4 (four) times daily.    Historical Provider, MD  tiZANidine (ZANAFLEX) 4 MG tablet Take 4-8 mg by mouth 2 (two) times daily. She takes one tablet in the morning and two tablets in the evening.    Historical Provider, MD   BP 151/110  Pulse 113  Temp(Src) 98.8 F (37.1 C) (Oral)  Resp 20  SpO2 99% Physical Exam  Nursing note and vitals reviewed. Constitutional: She appears well-developed and well-nourished. No distress.  HENT:   Mouth/Throat: Oropharynx is clear and moist.  Eyes: Conjunctivae are normal. No scleral icterus.  Neck: Neck supple. No tracheal deviation present.  Cardiovascular: Normal rate, regular rhythm, normal heart sounds and intact distal pulses.  Exam reveals no gallop and no friction rub.   No murmur heard. Pulmonary/Chest: Effort normal and breath sounds normal. No respiratory distress.  Abdominal: Soft. Normal appearance and bowel sounds are normal. She exhibits mass. She exhibits no distension. There is tenderness. There is no rebound and no guarding.  Obese.  No incarcerated hernia. No distension. Upper abd tenderness without rebound or guarding.   Genitourinary:  No cva tenderness  Musculoskeletal: She exhibits no edema.  Neurological: She is  alert.  Skin: Skin is warm and dry. No rash noted.  Psychiatric: She has a normal mood and affect.    ED Course  Procedures (including critical care time) Labs Review Results for orders placed during the hospital encounter of 08/30/13  CBC WITH DIFFERENTIAL      Result Value Ref Range   WBC 5.1  4.0 - 10.5 K/uL   RBC 5.31 (*) 3.87 - 5.11 MIL/uL   Hemoglobin 14.9  12.0 - 15.0 g/dL   HCT 09.845.1  11.936.0 - 14.746.0 %   MCV 84.9  78.0 - 100.0 fL   MCH 28.1  26.0 - 34.0 pg   MCHC 33.0  30.0 - 36.0 g/dL   RDW 82.913.1  56.211.5 - 13.015.5 %   Platelets 299  150 - 400 K/uL   Neutrophils Relative % 64  43 - 77 %   Neutro Abs 3.2  1.7 - 7.7 K/uL   Lymphocytes Relative 28  12 - 46 %   Lymphs Abs 1.4  0.7 - 4.0 K/uL   Monocytes Relative 8  3 - 12 %   Monocytes Absolute 0.4  0.1 - 1.0 K/uL   Eosinophils Relative 0  0 - 5 %   Eosinophils Absolute 0.0  0.0 - 0.7 K/uL   Basophils Relative 0  0 - 1 %   Basophils Absolute 0.0  0.0 - 0.1 K/uL  COMPREHENSIVE METABOLIC PANEL      Result Value Ref Range   Sodium 133 (*) 137 - 147 mEq/L   Potassium 4.0  3.7 - 5.3 mEq/L   Chloride 93 (*) 96 - 112 mEq/L   CO2 25  19 - 32 mEq/L   Glucose, Bld 112 (*) 70 - 99 mg/dL   BUN 14   6 - 23 mg/dL   Creatinine, Ser 8.650.90  0.50 - 1.10 mg/dL   Calcium 9.7  8.4 - 78.410.5 mg/dL   Total Protein 8.0  6.0 - 8.3 g/dL   Albumin 4.4  3.5 - 5.2 g/dL   AST 22  0 - 37 U/L   ALT 21  0 - 35 U/L   Alkaline Phosphatase 97  39 - 117 U/L   Total Bilirubin 0.5  0.3 - 1.2 mg/dL   GFR calc non Af Amer 73 (*) >90 mL/min   GFR calc Af Amer 85 (*) >90 mL/min   Anion gap 15  5 - 15  LIPASE, BLOOD      Result Value Ref Range   Lipase 16  11 - 59 U/L  URINALYSIS, ROUTINE W REFLEX MICROSCOPIC      Result Value Ref Range   Color, Urine YELLOW  YELLOW   APPearance CLEAR  CLEAR   Specific Gravity, Urine 1.036 (*) 1.005 - 1.030   pH 5.5  5.0 - 8.0   Glucose, UA NEGATIVE  NEGATIVE mg/dL   Hgb urine dipstick TRACE (*) NEGATIVE   Bilirubin Urine SMALL (*) NEGATIVE   Ketones, ur 15 (*) NEGATIVE mg/dL   Protein, ur NEGATIVE  NEGATIVE mg/dL   Urobilinogen, UA 0.2  0.0 - 1.0 mg/dL   Nitrite NEGATIVE  NEGATIVE   Leukocytes, UA NEGATIVE  NEGATIVE  URINE MICROSCOPIC-ADD ON      Result Value Ref Range   Squamous Epithelial / LPF RARE  RARE   WBC, UA 0-2  <3 WBC/hpf   RBC / HPF 0-2  <3 RBC/hpf   Dg Ankle Complete Left  08/02/2013   Multiple views of left indicate minimal spur formation no  indications of  stress fracture or advanced arthritis      EKG Interpretation   Date/Time:  Thursday August 30 2013 08:50:44 EDT Ventricular Rate:  93 PR Interval:  172 QRS Duration: 106 QT Interval:  343 QTC Calculation: 427 R Axis:   22 Text Interpretation:  Sinus rhythm Baseline wander No significant change  since last tracing Confirmed by Iyanna Drummer  MD, Caryn BeeKEVIN ([id number]) on 08/30/2013  8:58:11 AM      MDM  Iv ns bolus.   Pt clarifies, she is not allergic to dilaudid or morphine, but states needs benadryl w either, due to itching.  Dilaudid 1 mg iv. zofran iv. Benadryl iv. protonix iv.  Reviewed nursing notes and prior charts for additional history.   Within past 3 months, mr abd and ct abd to eval  similar symptoms - neg for acute process. Note made of other imaging studies prior to that also for similar symptoms, neg for acute process.  Pt also w recent gi eval for same in past couple months - colonoscopy and egd neg.   abd soft nt.  Additional ivf.    Po fluids. Pt tolerates.  During 4 hr obs and recheck in ED no emesis or diarrhea. afeb  abd soft nt.  Hr 82, rr 16.  Pt appears stable for d/c.      Suzi RootsKevin E Sadiq Mccauley, MD 08/30/13 1219

## 2013-08-30 NOTE — ED Notes (Signed)
C/o started feeling bad since MOnday.  Pt c/o nausea/vomiting and doesn't know if the vomiting has caused her ulcer to act up.  Pt c/o abd pain, pelvic pain. Pt unable to tolerate any fluids or foods. Pt states that even her spit even makes her nauseated. Pt also c/o headache.

## 2013-08-30 NOTE — ED Notes (Signed)
Pt states she is still nauseas. Pt states she has been spitting up saliva, states she has nothing i her stomach to vomit.

## 2013-09-03 DIAGNOSIS — R112 Nausea with vomiting, unspecified: Secondary | ICD-10-CM | POA: Insufficient documentation

## 2013-09-12 ENCOUNTER — Ambulatory Visit (INDEPENDENT_AMBULATORY_CARE_PROVIDER_SITE_OTHER): Payer: Commercial Managed Care - PPO | Admitting: Surgery

## 2013-09-17 ENCOUNTER — Ambulatory Visit: Payer: 59 | Admitting: Podiatry

## 2013-09-25 ENCOUNTER — Telehealth: Payer: Self-pay | Admitting: Internal Medicine

## 2013-09-25 NOTE — Telephone Encounter (Signed)
Questions answered.  Dr. Rhea BeltonPyrtle will be the contact physician for our office if there are further questions.

## 2013-09-29 DEATH — deceased

## 2013-10-31 ENCOUNTER — Ambulatory Visit (INDEPENDENT_AMBULATORY_CARE_PROVIDER_SITE_OTHER): Payer: Commercial Managed Care - PPO | Admitting: Surgery

## 2014-03-05 ENCOUNTER — Other Ambulatory Visit (HOSPITAL_COMMUNITY): Payer: Self-pay | Admitting: Physical Medicine and Rehabilitation

## 2014-03-05 DIAGNOSIS — M5416 Radiculopathy, lumbar region: Secondary | ICD-10-CM

## 2014-03-18 ENCOUNTER — Ambulatory Visit (HOSPITAL_COMMUNITY)
Admission: RE | Admit: 2014-03-18 | Discharge: 2014-03-18 | Disposition: A | Discharge status: Home / Self Care | Payer: 59 | Source: Ambulatory Visit | Attending: Physical Medicine and Rehabilitation | Admitting: Physical Medicine and Rehabilitation

## 2014-03-18 DIAGNOSIS — M5416 Radiculopathy, lumbar region: Secondary | ICD-10-CM | POA: Diagnosis present

## 2014-03-18 DIAGNOSIS — M4806 Spinal stenosis, lumbar region: Secondary | ICD-10-CM | POA: Insufficient documentation

## 2014-05-28 ENCOUNTER — Other Ambulatory Visit (HOSPITAL_COMMUNITY): Payer: Self-pay | Admitting: Obstetrics and Gynecology

## 2014-05-28 DIAGNOSIS — Z1231 Encounter for screening mammogram for malignant neoplasm of breast: Secondary | ICD-10-CM

## 2014-06-07 ENCOUNTER — Ambulatory Visit (HOSPITAL_COMMUNITY): Payer: 59

## 2014-06-14 ENCOUNTER — Emergency Department (HOSPITAL_COMMUNITY): Payer: 59

## 2014-06-14 ENCOUNTER — Observation Stay (HOSPITAL_COMMUNITY)
Admission: EM | Admit: 2014-06-14 | Discharge: 2014-06-16 | Disposition: A | Discharge status: Home / Self Care | Payer: 59 | Attending: Internal Medicine | Admitting: Internal Medicine

## 2014-06-14 ENCOUNTER — Encounter (HOSPITAL_COMMUNITY): Payer: Self-pay | Admitting: Emergency Medicine

## 2014-06-14 DIAGNOSIS — Z881 Allergy status to other antibiotic agents status: Secondary | ICD-10-CM | POA: Diagnosis not present

## 2014-06-14 DIAGNOSIS — K219 Gastro-esophageal reflux disease without esophagitis: Secondary | ICD-10-CM | POA: Insufficient documentation

## 2014-06-14 DIAGNOSIS — R42 Dizziness and giddiness: Secondary | ICD-10-CM | POA: Diagnosis not present

## 2014-06-14 DIAGNOSIS — M810 Age-related osteoporosis without current pathological fracture: Secondary | ICD-10-CM | POA: Insufficient documentation

## 2014-06-14 DIAGNOSIS — Z6841 Body Mass Index (BMI) 40.0 and over, adult: Secondary | ICD-10-CM | POA: Diagnosis not present

## 2014-06-14 DIAGNOSIS — M549 Dorsalgia, unspecified: Secondary | ICD-10-CM | POA: Insufficient documentation

## 2014-06-14 DIAGNOSIS — Z9884 Bariatric surgery status: Secondary | ICD-10-CM | POA: Diagnosis not present

## 2014-06-14 DIAGNOSIS — M542 Cervicalgia: Secondary | ICD-10-CM

## 2014-06-14 DIAGNOSIS — E669 Obesity, unspecified: Secondary | ICD-10-CM

## 2014-06-14 DIAGNOSIS — Z885 Allergy status to narcotic agent status: Secondary | ICD-10-CM | POA: Insufficient documentation

## 2014-06-14 DIAGNOSIS — E875 Hyperkalemia: Secondary | ICD-10-CM | POA: Diagnosis not present

## 2014-06-14 DIAGNOSIS — R51 Headache: Secondary | ICD-10-CM

## 2014-06-14 DIAGNOSIS — R519 Headache, unspecified: Secondary | ICD-10-CM | POA: Diagnosis present

## 2014-06-14 DIAGNOSIS — G629 Polyneuropathy, unspecified: Secondary | ICD-10-CM | POA: Insufficient documentation

## 2014-06-14 DIAGNOSIS — Z8042 Family history of malignant neoplasm of prostate: Secondary | ICD-10-CM | POA: Insufficient documentation

## 2014-06-14 DIAGNOSIS — Z90710 Acquired absence of both cervix and uterus: Secondary | ICD-10-CM | POA: Insufficient documentation

## 2014-06-14 DIAGNOSIS — I10 Essential (primary) hypertension: Secondary | ICD-10-CM | POA: Insufficient documentation

## 2014-06-14 LAB — BASIC METABOLIC PANEL
ANION GAP: 8 (ref 5–15)
BUN: 16 mg/dL (ref 6–23)
CO2: 27 mmol/L (ref 19–32)
Calcium: 9 mg/dL (ref 8.4–10.5)
Chloride: 102 mmol/L (ref 96–112)
Creatinine, Ser: 1.01 mg/dL (ref 0.50–1.10)
GFR, EST AFRICAN AMERICAN: 74 mL/min — AB (ref >90)
GFR, EST NON AFRICAN AMERICAN: 64 mL/min — AB (ref >90)
Glucose, Bld: 92 mg/dL (ref 70–99)
Potassium: 6.6 mmol/L (ref 3.5–5.1)
Sodium: 137 mmol/L (ref 135–145)

## 2014-06-14 LAB — CBC WITH DIFFERENTIAL/PLATELET
BASOS PCT: 0 % (ref 0–1)
Basophils Absolute: 0 10*3/uL (ref 0.0–0.1)
EOS ABS: 0 10*3/uL (ref 0.0–0.7)
Eosinophils Relative: 0 % (ref 0–5)
HCT: 41.1 % (ref 36.0–46.0)
Hemoglobin: 13 g/dL (ref 12.0–15.0)
LYMPHS ABS: 2.8 10*3/uL (ref 0.7–4.0)
LYMPHS PCT: 38 % (ref 12–46)
MCH: 27.7 pg (ref 26.0–34.0)
MCHC: 31.6 g/dL (ref 30.0–36.0)
MCV: 87.6 fL (ref 78.0–100.0)
Monocytes Absolute: 0.5 10*3/uL (ref 0.1–1.0)
Monocytes Relative: 7 % (ref 3–12)
Neutro Abs: 4 10*3/uL (ref 1.7–7.7)
Neutrophils Relative %: 55 % (ref 43–77)
Platelets: 281 10*3/uL (ref 150–400)
RBC: 4.69 MIL/uL (ref 3.87–5.11)
RDW: 14.2 % (ref 11.5–15.5)
WBC: 7.3 10*3/uL (ref 4.0–10.5)

## 2014-06-14 LAB — I-STAT TROPONIN, ED: Troponin i, poc: 0 ng/mL (ref 0.00–0.08)

## 2014-06-14 LAB — POTASSIUM: POTASSIUM: 4.1 mmol/L (ref 3.5–5.1)

## 2014-06-14 LAB — POC URINE PREG, ED: PREG TEST UR: NEGATIVE

## 2014-06-14 MED ORDER — TIZANIDINE HCL 4 MG PO TABS
8.0000 mg | ORAL_TABLET | Freq: Every day | ORAL | Status: DC
  Administered 2014-06-14 – 2014-06-15 (×2): 8 mg via ORAL
  Filled 2014-06-14 (×3): qty 2

## 2014-06-14 MED ORDER — BUTALBITAL-APAP-CAFFEINE 50-325-40 MG PO TABS
1.0000 | ORAL_TABLET | ORAL | Status: DC | PRN
  Administered 2014-06-15 (×2): 1 via ORAL
  Filled 2014-06-14 (×2): qty 1

## 2014-06-14 MED ORDER — GABAPENTIN 300 MG PO CAPS
600.0000 mg | ORAL_CAPSULE | Freq: Every day | ORAL | Status: DC
  Administered 2014-06-14 – 2014-06-15 (×2): 600 mg via ORAL
  Filled 2014-06-14 (×2): qty 2

## 2014-06-14 MED ORDER — DEXTROSE 50 % IV SOLN
50.0000 mL | Freq: Once | INTRAVENOUS | Status: DC
  Filled 2014-06-14: qty 50

## 2014-06-14 MED ORDER — SODIUM CHLORIDE 0.9 % IJ SOLN
3.0000 mL | Freq: Two times a day (BID) | INTRAMUSCULAR | Status: DC
  Administered 2014-06-14 – 2014-06-15 (×3): 3 mL via INTRAVENOUS

## 2014-06-14 MED ORDER — DIPHENHYDRAMINE HCL 25 MG PO CAPS
25.0000 mg | ORAL_CAPSULE | Freq: Four times a day (QID) | ORAL | Status: DC | PRN
  Administered 2014-06-15 – 2014-06-16 (×4): 25 mg via ORAL
  Filled 2014-06-14 (×4): qty 1

## 2014-06-14 MED ORDER — SODIUM CHLORIDE 0.9 % IV SOLN
1.0000 g | Freq: Once | INTRAVENOUS | Status: DC
  Filled 2014-06-14: qty 10

## 2014-06-14 MED ORDER — TAPENTADOL HCL 50 MG PO TABS
50.0000 mg | ORAL_TABLET | Freq: Two times a day (BID) | ORAL | Status: DC
  Administered 2014-06-15 – 2014-06-16 (×4): 50 mg via ORAL
  Filled 2014-06-14 (×4): qty 1

## 2014-06-14 MED ORDER — GABAPENTIN 300 MG PO CAPS
300.0000 mg | ORAL_CAPSULE | Freq: Every day | ORAL | Status: DC
  Administered 2014-06-15 – 2014-06-16 (×2): 300 mg via ORAL
  Filled 2014-06-14 (×2): qty 1

## 2014-06-14 MED ORDER — METOCLOPRAMIDE HCL 5 MG/ML IJ SOLN
10.0000 mg | Freq: Once | INTRAMUSCULAR | Status: AC
  Administered 2014-06-14: 10 mg via INTRAVENOUS
  Filled 2014-06-14: qty 2

## 2014-06-14 MED ORDER — ONDANSETRON HCL 4 MG/2ML IJ SOLN: 4.0000 mg | Freq: Four times a day (QID) | INTRAMUSCULAR | Status: DC | PRN

## 2014-06-14 MED ORDER — IOHEXOL 350 MG/ML SOLN: 100.0000 mL | Freq: Once | INTRAVENOUS | Status: DC | PRN

## 2014-06-14 MED ORDER — IOHEXOL 350 MG/ML SOLN
100.0000 mL | Freq: Once | INTRAVENOUS | Status: AC | PRN
  Administered 2014-06-14: 100 mL via INTRAVENOUS

## 2014-06-14 MED ORDER — DIPHENHYDRAMINE HCL 50 MG/ML IJ SOLN
25.0000 mg | Freq: Once | INTRAMUSCULAR | Status: AC
  Administered 2014-06-14: 25 mg via INTRAVENOUS
  Filled 2014-06-14: qty 1

## 2014-06-14 MED ORDER — SODIUM CHLORIDE 0.9 % IV BOLUS (SEPSIS)
1000.0000 mL | Freq: Once | INTRAVENOUS | Status: AC
  Administered 2014-06-14: 1000 mL via INTRAVENOUS

## 2014-06-14 MED ORDER — PANTOPRAZOLE SODIUM 40 MG PO TBEC
40.0000 mg | DELAYED_RELEASE_TABLET | Freq: Two times a day (BID) | ORAL | Status: DC
  Administered 2014-06-14 – 2014-06-16 (×4): 40 mg via ORAL
  Filled 2014-06-14 (×5): qty 1

## 2014-06-14 MED ORDER — TRAZODONE HCL 50 MG PO TABS
100.0000 mg | ORAL_TABLET | Freq: Every day | ORAL | Status: DC
  Administered 2014-06-14 – 2014-06-15 (×2): 100 mg via ORAL
  Filled 2014-06-14 (×2): qty 2

## 2014-06-14 MED ORDER — HYDROCODONE-ACETAMINOPHEN 5-325 MG PO TABS
1.0000 | ORAL_TABLET | Freq: Four times a day (QID) | ORAL | Status: DC | PRN
  Administered 2014-06-14: 1 via ORAL
  Administered 2014-06-15: 2 via ORAL
  Administered 2014-06-15 (×2): 1 via ORAL
  Administered 2014-06-16: 2 via ORAL
  Filled 2014-06-14: qty 1
  Filled 2014-06-14 (×2): qty 2
  Filled 2014-06-14 (×3): qty 1

## 2014-06-14 MED ORDER — INSULIN ASPART 100 UNIT/ML IV SOLN
10.0000 [IU] | Freq: Once | INTRAVENOUS | Status: DC
  Filled 2014-06-14: qty 0.1

## 2014-06-14 MED ORDER — LORAZEPAM 2 MG/ML IJ SOLN
1.0000 mg | Freq: Once | INTRAMUSCULAR | Status: AC
  Administered 2014-06-14: 1 mg via INTRAVENOUS
  Filled 2014-06-14: qty 1

## 2014-06-14 MED ORDER — ENOXAPARIN SODIUM 40 MG/0.4ML ~~LOC~~ SOLN
40.0000 mg | Freq: Every day | SUBCUTANEOUS | Status: DC
  Administered 2014-06-14 – 2014-06-15 (×2): 40 mg via SUBCUTANEOUS
  Filled 2014-06-14 (×3): qty 0.4

## 2014-06-14 MED ORDER — ONDANSETRON HCL 4 MG PO TABS: 4.0000 mg | ORAL_TABLET | Freq: Four times a day (QID) | ORAL | Status: DC | PRN

## 2014-06-14 NOTE — ED Notes (Signed)
[phone] = pt's best friend, Sunny SchleinFelicia. Call if need anything.

## 2014-06-14 NOTE — ED Notes (Addendum)
Pt states she started having dizzy spells and nose bleeds after changing blood pressure medications, pt fell on Saturday, pt found her blood pressure to be too low and so had her blood pressure medications changed again. Pt felt left sided numbness and left sided jaw pain onset this morning when she awoke at 0515, along with left sided blurry vision and headache, and noted her blood pressure to be high. Pronator drift and weakness present to left arms and legs bilaterally, left sided limb ataxia present.

## 2014-06-14 NOTE — ED Provider Notes (Signed)
CSN: [id number]     Arrival date & time 06/14/14  1723 History   First MD Initiated Contact with Patient 06/14/14 1728     Chief Complaint  Patient presents with  . Hypertension  . Numbness     (Consider location/radiation/quality/duration/timing/severity/associated sxs/prior Treatment) HPI Comments: Dizziness began this morning when she awoke. Associated nausea. No vomiting. No chest pain.  Dizziness described as "swimmy-headed," no near syncope. States mild L eye blurry vision.  No double vision.  Dizziness worse with moving around, better with lying still, but does not abate. No ataxia noted per patient.  She also reports some L sided headache and jaw pain.  Patient is a 49 y.o. female presenting with hypertension. The history is provided by the patient.  Hypertension This is a new problem. The current episode started 6 to 12 hours ago. The problem occurs constantly. The problem has not changed since onset.Pertinent negatives include no abdominal pain and no shortness of breath. Nothing aggravates the symptoms. Nothing relieves the symptoms.    Past Medical History  Diagnosis Date  . Fatigue 2012  . Malaise 2012  . Insomnia   . Endometriosis   . Sleep apnea 2005  . Morbid obesity 2005  . Yeast infection   . Trichomonas   . Complication of anesthesia   . H/O varicella   . H/O mumps   . H/O bladder infections   . Insomnia secondary to situational depression 06/06/07  . Atrophic vaginitis 2009  . BV (bacterial vaginosis) 2006  . Liver mass, right lobe 08/13/04    Nodule  . Arthritis 12/10/03  . SUI (stress urinary incontinence, female) 08/12/03  . History of candidal vulvovaginitis 03/21/03  . Vulvar lesion 03/21/03  . Anemia   . Gallstones   . GERD (gastroesophageal reflux disease)   . HTN (hypertension)   . Allergy   . Osteoporosis   . S/P gastric bypass   . Chronic back pain   . Chronic LLQ pain   . Elevated LFTs    Past Surgical History  Procedure Laterality  Date  . Wisdom tooth extraction    . Appendectomy  2001  . Tonsillectomy    . Cholecystectomy  2011  . Abdominal hysterectomy  2001  . Gastric bypass  2007    [facility], Dr. Daphine Deutscher  . Laparotomy    . Dilation and curettage of uterus    . Knee arthroscopy Left    Family History  Problem Relation Age of Onset  . Colitis Sister   . Diabetes Paternal Grandmother   . Leukemia Maternal Grandmother   . Arthritis Mother   . Prostate cancer Father    History  Substance Use Topics  . Smoking status: Never Smoker   . Smokeless tobacco: Never Used  . Alcohol Use: No   OB History    Gravida Para Term Preterm AB TAB SAB Ectopic Multiple Living   0 0             Review of Systems  Constitutional: Negative for fever.  Respiratory: Negative for cough and shortness of breath.   Gastrointestinal: Positive for nausea. Negative for vomiting and abdominal pain.  Neurological: Positive for dizziness.  All other systems reviewed and are negative.     Allergies  Morphine and related; Septra; Sulfa antibiotics; Ultram; Ciprofloxacin hcl; Codeine; Darvocet; Demerol; Dilaudid; and Adhesive  Home Medications   Prior to Admission medications   Medication Sig Start Date End Date Taking? Authorizing Provider  buPROPion (WELLBUTRIN XL)  150 MG 24 hr tablet Take 150 mg by mouth every morning.     Historical Provider, MD  CALCIUM PO Take 1 tablet by mouth every morning.     Historical Provider, MD  Cyanocobalamin (VITAMIN B-12 PO) Take 1 tablet by mouth every morning.     Historical Provider, MD  diphenhydrAMINE (BENADRYL) 25 MG tablet Take 25 mg by mouth every 6 (six) hours as needed for allergies.    Historical Provider, MD  Fe Fum-FePoly-FA-Vit C-Vit B3 (FOLIVANE-F PO) Take 1 capsule by mouth every morning.     Historical Provider, MD  hydrochlorothiazide (HYDRODIURIL) 50 MG tablet Take 50 mg by mouth every morning.     Historical Provider, MD  HYDROcodone-acetaminophen  (NORCO/VICODIN) 5-325 MG per tablet Take 1-2 tablets by mouth every 6 (six) hours as needed for moderate pain. 08/30/13   Cathren Laine, MD  Menthol, Topical Analgesic, (BIOFREEZE ROLL-ON COLORLESS EX) Apply 1 application topically daily as needed (For pain.).     Historical Provider, MD  metroNIDAZOLE (FLAGYL) 500 MG tablet Take 1 tablet (500 mg total) by mouth 2 (two) times daily. 06/01/13   Felicie Morn, NP  Multiple Vitamins-Minerals (MULTIVITAMIN WITH MINERALS) tablet Take 1 tablet by mouth every morning.     Historical Provider, MD  ondansetron (ZOFRAN ODT) 8 MG disintegrating tablet Take 1 tablet (8 mg total) by mouth every 8 (eight) hours as needed for nausea or vomiting. 08/30/13   Cathren Laine, MD  oxyCODONE-acetaminophen (PERCOCET) 10-325 MG per tablet Take 1 tablet by mouth every 6 (six) hours as needed for pain. 06/01/13   Felicie Morn, NP  oxyCODONE-acetaminophen (PERCOCET/ROXICET) 5-325 MG per tablet Take 1 tablet by mouth every 6 (six) hours as needed for severe pain. 05/22/13   Beverley Fiedler, MD  pantoprazole (PROTONIX) 40 MG tablet Take 40 mg by mouth 2 (two) times daily.    Historical Provider, MD  sucralfate (CARAFATE) 1 G tablet Take 1 g by mouth 4 (four) times daily.    Historical Provider, MD  tiZANidine (ZANAFLEX) 4 MG tablet Take 4-8 mg by mouth 2 (two) times daily. She takes one tablet in the morning and two tablets in the evening.    Historical Provider, MD   BP 180/100 mmHg  Pulse 71  Temp(Src) 98.6 F (37 C) (Oral)  Resp 16  SpO2 99% Physical Exam  Constitutional: She is oriented to person, place, and time. She appears well-developed and well-nourished. No distress.  HENT:  Head: Normocephalic and atraumatic.  Mouth/Throat: Oropharynx is clear and moist.  Eyes: EOM are normal. Pupils are equal, round, and reactive to light.  Neck: Normal range of motion. Neck supple.  Cardiovascular: Normal rate and regular rhythm.  Exam reveals no friction rub.   No murmur  heard. Pulmonary/Chest: Effort normal and breath sounds normal. No respiratory distress. She has no wheezes. She has no rales.  Abdominal: Soft. She exhibits no distension. There is no tenderness. There is no rebound.  Musculoskeletal: Normal range of motion. She exhibits no edema.  Neurological: She is alert and oriented to person, place, and time. No cranial nerve deficit or sensory deficit. She exhibits normal muscle tone. Coordination normal. GCS eye subscore is 4. GCS verbal subscore is 5. GCS motor subscore is 6.  Difficulty with heel-to-shin on left side Mild difficulty with finger-to-nose left  Skin: No rash noted. She is not diaphoretic.  Nursing note and vitals reviewed.   ED Course  Procedures (including critical care time) Labs Review Labs Reviewed  CBC WITH DIFFERENTIAL/PLATELET  BASIC METABOLIC PANEL  I-STAT TROPOININ, ED    Imaging Review Ct Angio Head W/cm &/or Wo Cm  06/14/2014   CLINICAL DATA:  49 year old female with dizziness, epistaxis, on blood pressure medication, recent fall. Left side numbness and jaw pain. Blurred vision, limb ataxia. Initial encounter.  EXAM: CT ANGIOGRAPHY HEAD AND NECK  TECHNIQUE: Multidetector CT imaging of the head and neck was performed using the standard protocol during bolus administration of intravenous contrast. Multiplanar CT image reconstructions and MIPs were obtained to evaluate the vascular anatomy. Carotid stenosis measurements (when applicable) are obtained utilizing NASCET criteria, using the distal internal carotid diameter as the denominator.  CONTRAST:  100 mL Omnipaque 350  COMPARISON:  Head CT without contrast 1840 hours today.  FINDINGS: CTA NECK  Skeleton: No acute osseous abnormality identified. Visualized paranasal sinuses and mastoids are clear.  Other neck: Negative lung apices. Up to 1 cm bilateral mildly hypodense thyroid nodules are visible, these do not meet size criteria for ultrasound follow-up. Larynx, pharynx,  parapharyngeal spaces, retropharyngeal space, sublingual space, submandibular glands, and parotid glands are within normal limits. Visualized orbit soft tissues are within normal limits. No cervical lymphadenopathy.  Aortic arch: Aberrant origin of the right subclavian artery such that a 4 vessel arch configuration is present. No arch atherosclerosis.  Right carotid system: No right CCA origin stenosis. The right vertebral artery arises from the right CCA as seen on series 5, image 21, normal anatomic variant). Normal right carotid bifurcation. Normal cervical right ICA.  Left carotid system: No left CCA stenosis. Normal left carotid bifurcation. There is minimal to mild soft plaque and/or irregularity of the distal cervical left ICA as seen on series 5, image 81, but no significant stenosis. Otherwise negative cervical left ICA.  Vertebral arteries:  The right vertebral artery arises from the right CCA in this patient with an aberrant origin of the right subclavian artery.  The right vertebral artery then has a slightly late entry into the cervical transverse foramen beginning at C5. It is negative to the skullbase.  No proximal left subclavian artery stenosis. Normal left vertebral artery origin. Tortuous proximal left vertebral artery, which is otherwise normal to the skullbase.  CTA HEAD  Posterior circulation: Codominant distal vertebral arteries. Normal vertebrobasilar junction. Small left PICA and dominant appearing right AICA origins are patent. No basilar artery stenosis. SCA and PCA origins are normal. Posterior communicating arteries are diminutive or absent. Bilateral PCA branches are within normal limits.  Anterior circulation:  Normal right ICA siphon. Normal left ICA siphon. Normal right ophthalmic artery origin. Probable infundibulum at the rib left ophthalmic artery origin (series 6, image 207). Normal carotid termini, MCA and ACA origins.  Anterior communicating artery is diminutive or absent.  Bilateral ACA branches are within normal limits. Left MCA branches are within normal limits. Right MCA branches are within normal limits.  Venous sinuses: Within normal limits.  Anatomic variants: Aberrant origin of the right subclavian artery. Right vertebral artery origin off of the right CCA.  Delayed phase: No abnormal enhancement identified. Stable and normal gray-white matter differentiation.  IMPRESSION: 1. Largely negative CTA head and neck, with no arterial stenosis, occlusion, or dissection. 2. Minimal irregularity of the distal cervical left ICA. Probable small infundibulum at the origin of the left ophthalmic artery. 3. 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 4. Stable and negative CT appearance of the head.   Electronically Signed   By: HRexene Edison  Margo Aye M.D.   On: 06/14/2014 19:20   Ct Head Wo Contrast  06/14/2014   CLINICAL DATA:  Dizziness. Nosebleeds. Hypertension. Blurred vision.  EXAM: CT HEAD WITHOUT CONTRAST  TECHNIQUE: Contiguous axial images were obtained from the base of the skull through the vertex without intravenous contrast.  COMPARISON:  10/22/2010  FINDINGS: The brain has a normal appearance without evidence of atrophy, infarction, mass lesion, hemorrhage, hydrocephalus or extra-axial collection. The calvarium is unremarkable. The paranasal sinuses, middle ears and mastoids are clear.  IMPRESSION: Normal head CT   Electronically Signed   By: Paulina Fusi M.D.   On: 06/14/2014 18:48   Ct Angio Neck W/cm &/or Wo/cm  06/14/2014   CLINICAL DATA:  49 year old female with dizziness, epistaxis, on blood pressure medication, recent fall. Left side numbness and jaw pain. Blurred vision, limb ataxia. Initial encounter.  EXAM: CT ANGIOGRAPHY HEAD AND NECK  TECHNIQUE: Multidetector CT imaging of the head and neck was performed using the standard protocol during bolus administration of intravenous contrast. Multiplanar CT image  reconstructions and MIPs were obtained to evaluate the vascular anatomy. Carotid stenosis measurements (when applicable) are obtained utilizing NASCET criteria, using the distal internal carotid diameter as the denominator.  CONTRAST:  100 mL Omnipaque 350  COMPARISON:  Head CT without contrast 1840 hours today.  FINDINGS: CTA NECK  Skeleton: No acute osseous abnormality identified. Visualized paranasal sinuses and mastoids are clear.  Other neck: Negative lung apices. Up to 1 cm bilateral mildly hypodense thyroid nodules are visible, these do not meet size criteria for ultrasound follow-up. Larynx, pharynx, parapharyngeal spaces, retropharyngeal space, sublingual space, submandibular glands, and parotid glands are within normal limits. Visualized orbit soft tissues are within normal limits. No cervical lymphadenopathy.  Aortic arch: Aberrant origin of the right subclavian artery such that a 4 vessel arch configuration is present. No arch atherosclerosis.  Right carotid system: No right CCA origin stenosis. The right vertebral artery arises from the right CCA as seen on series 5, image 21, normal anatomic variant). Normal right carotid bifurcation. Normal cervical right ICA.  Left carotid system: No left CCA stenosis. Normal left carotid bifurcation. There is minimal to mild soft plaque and/or irregularity of the distal cervical left ICA as seen on series 5, image 81, but no significant stenosis. Otherwise negative cervical left ICA.  Vertebral arteries:  The right vertebral artery arises from the right CCA in this patient with an aberrant origin of the right subclavian artery.  The right vertebral artery then has a slightly late entry into the cervical transverse foramen beginning at C5. It is negative to the skullbase.  No proximal left subclavian artery stenosis. Normal left vertebral artery origin. Tortuous proximal left vertebral artery, which is otherwise normal to the skullbase.  CTA HEAD  Posterior  circulation: Codominant distal vertebral arteries. Normal vertebrobasilar junction. Small left PICA and dominant appearing right AICA origins are patent. No basilar artery stenosis. SCA and PCA origins are normal. Posterior communicating arteries are diminutive or absent. Bilateral PCA branches are within normal limits.  Anterior circulation:  Normal right ICA siphon. Normal left ICA siphon. Normal right ophthalmic artery origin. Probable infundibulum at the rib left ophthalmic artery origin (series 6, image 207). Normal carotid termini, MCA and ACA origins.  Anterior communicating artery is diminutive or absent. Bilateral ACA branches are within normal limits. Left MCA branches are within normal limits. Right MCA branches are within normal limits.  Venous sinuses: Within normal limits.  Anatomic variants: Aberrant origin of the  right subclavian artery. Right vertebral artery origin off of the right CCA.  Delayed phase: No abnormal enhancement identified. Stable and normal gray-white matter differentiation.  IMPRESSION: 1. Largely negative CTA head and neck, with no arterial stenosis, occlusion, or dissection. 2. Minimal irregularity of the distal cervical left ICA. Probable small infundibulum at the origin of the left ophthalmic artery. 3. 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 4. Stable and negative CT appearance of the head.   Electronically Signed   By: Odessa Fleming M.D.   On: 06/14/2014 19:20   Mr Brain Wo Contrast  06/14/2014   CLINICAL DATA:  49 year old female with dizziness, epistaxis, on blood pressure medication, recent fall. Left side numbness and jaw pain. Blurred vision, limb ataxia.  Initial encounter.  EXAM: MRI HEAD WITHOUT CONTRAST  TECHNIQUE: Multiplanar, multiecho pulse sequences of the brain and surrounding structures were obtained without intravenous contrast.  COMPARISON:  CTA head and neck from 1848 hours the same day  FINDINGS: No  restricted diffusion or evidence of acute infarction. Major intracranial vascular flow voids are within normal limits.  Cerebral volume is within normal limits for age. No midline shift, mass effect, evidence of mass lesion, extra-axial collection or acute intracranial hemorrhage. Cervicomedullary junction and pituitary are within normal limits.  There is a small web or adhesion suspected in the frontal horn of the right lateral ventricle (series 6, image 15), but no ventriculomegaly and this most likely is congenital.  Wallace Cullens and white matter signal is within normal limits for age throughout the brain.  Visible internal auditory structures appear normal. Mastoids are clear. Trace paranasal sinus mucosal thickening. Visualized orbit soft tissues are within normal limits. Visualized scalp soft tissues are within normal limits. Normal bone marrow signal. Negative visualized cervical spine.  IMPRESSION: No acute intracranial abnormality. Negative for age noncontrast MRI appearance of the brain.   Electronically Signed   By: Odessa Fleming M.D.   On: 06/14/2014 22:17     EKG Interpretation   Date/Time:  Friday June 14 2014 17:37:45 EDT Ventricular Rate:  69 PR Interval:  177 QRS Duration: 106 QT Interval:  432 QTC Calculation: 463 R Axis:   50 Text Interpretation:  Sinus rhythm No significant change since last  tracing Confirmed by Gwendolyn Grant  MD, Ulmer Degen ([id number]) on 06/14/2014 6:10:29 PM      MDM   Final diagnoses:  Dizziness  Neck pain    38F here with dizziness. Began this morning when she awoke around 5-530. It's been constant for past 12 hours. L sided headache and jaw pain present also. Dizziness described as "swimmy-headed." Recently taken off one of the BP meds (2 days ago), but is still taking losartan.  No CP, SOB. Mild hypertension, otherwise vitals ok. Has some mild difficulty with L sided finger-to-nose, but no past pointing. Is having difficulty with L sided heel-to-shin. Will scan her head  and obtain CT Angio of her neck with her headache and jaw pain. CTs normal. MR ordered for her dizziness. K elevated, could be from stopping HCTZ recently.  Repeat K normal.  Admitted.   Elwin Mocha, MD 06/15/14 [facility] Manners

## 2014-06-14 NOTE — ED Notes (Signed)
MD at bedside. 

## 2014-06-15 DIAGNOSIS — R51 Headache: Secondary | ICD-10-CM

## 2014-06-15 DIAGNOSIS — K219 Gastro-esophageal reflux disease without esophagitis: Secondary | ICD-10-CM | POA: Diagnosis present

## 2014-06-15 DIAGNOSIS — R519 Headache, unspecified: Secondary | ICD-10-CM | POA: Diagnosis present

## 2014-06-15 DIAGNOSIS — R208 Other disturbances of skin sensation: Secondary | ICD-10-CM | POA: Diagnosis not present

## 2014-06-15 DIAGNOSIS — G629 Polyneuropathy, unspecified: Secondary | ICD-10-CM

## 2014-06-15 DIAGNOSIS — I10 Essential (primary) hypertension: Secondary | ICD-10-CM | POA: Diagnosis present

## 2014-06-15 DIAGNOSIS — E875 Hyperkalemia: Secondary | ICD-10-CM | POA: Diagnosis not present

## 2014-06-15 DIAGNOSIS — R42 Dizziness and giddiness: Secondary | ICD-10-CM | POA: Diagnosis not present

## 2014-06-15 DIAGNOSIS — G43009 Migraine without aura, not intractable, without status migrainosus: Secondary | ICD-10-CM | POA: Diagnosis not present

## 2014-06-15 LAB — COMPREHENSIVE METABOLIC PANEL
ALBUMIN: 3.6 g/dL (ref 3.5–5.2)
ALT: 21 U/L (ref 0–35)
AST: 17 U/L (ref 0–37)
Alkaline Phosphatase: 65 U/L (ref 39–117)
Anion gap: 7 (ref 5–15)
BILIRUBIN TOTAL: 0.5 mg/dL (ref 0.3–1.2)
BUN: 14 mg/dL (ref 6–23)
CHLORIDE: 104 mmol/L (ref 96–112)
CO2: 28 mmol/L (ref 19–32)
CREATININE: 0.82 mg/dL (ref 0.50–1.10)
Calcium: 8.6 mg/dL (ref 8.4–10.5)
GFR calc Af Amer: >90 mL/min (ref >90)
GFR calc non Af Amer: 82 mL/min — ABNORMAL LOW (ref >90)
GLUCOSE: 93 mg/dL (ref 70–99)
Potassium: 3.6 mmol/L (ref 3.5–5.1)
SODIUM: 139 mmol/L (ref 135–145)
Total Protein: 6.4 g/dL (ref 6.0–8.3)

## 2014-06-15 LAB — CBC
HCT: 37.2 % (ref 36.0–46.0)
Hemoglobin: 11.8 g/dL — ABNORMAL LOW (ref 12.0–15.0)
MCH: 27.9 pg (ref 26.0–34.0)
MCHC: 31.7 g/dL (ref 30.0–36.0)
MCV: 87.9 fL (ref 78.0–100.0)
Platelets: 231 10*3/uL (ref 150–400)
RBC: 4.23 MIL/uL (ref 3.87–5.11)
RDW: 14.2 % (ref 11.5–15.5)
WBC: 6 10*3/uL (ref 4.0–10.5)

## 2014-06-15 LAB — URINALYSIS, ROUTINE W REFLEX MICROSCOPIC
Bilirubin Urine: NEGATIVE
Glucose, UA: NEGATIVE mg/dL
Hgb urine dipstick: NEGATIVE
Ketones, ur: NEGATIVE mg/dL
LEUKOCYTES UA: NEGATIVE
NITRITE: NEGATIVE
PROTEIN: NEGATIVE mg/dL
Specific Gravity, Urine: >1.046 — ABNORMAL HIGH (ref 1.005–1.030)
Urobilinogen, UA: 0.2 mg/dL (ref 0.0–1.0)
pH: 5.5 (ref 5.0–8.0)

## 2014-06-15 LAB — PROTIME-INR
INR: 0.99 (ref 0.00–1.49)
PROTHROMBIN TIME: 13.2 s (ref 11.6–15.2)

## 2014-06-15 MED ORDER — PROCHLORPERAZINE EDISYLATE 5 MG/ML IJ SOLN
10.0000 mg | Freq: Four times a day (QID) | INTRAMUSCULAR | Status: DC | PRN
  Administered 2014-06-15 – 2014-06-16 (×4): 10 mg via INTRAVENOUS
  Filled 2014-06-15 (×4): qty 2

## 2014-06-15 MED ORDER — KETOROLAC TROMETHAMINE 15 MG/ML IJ SOLN
15.0000 mg | Freq: Four times a day (QID) | INTRAMUSCULAR | Status: DC
  Administered 2014-06-15 – 2014-06-16 (×5): 15 mg via INTRAVENOUS
  Filled 2014-06-15 (×5): qty 1

## 2014-06-15 MED ORDER — HYDRALAZINE HCL 20 MG/ML IJ SOLN: 10.0000 mg | Freq: Four times a day (QID) | INTRAMUSCULAR | Status: DC | PRN

## 2014-06-15 NOTE — H&P (Signed)
[facility] History and Physical  Patient: Carly Delacruz  MRN: [id number]  DOB: 1965/06/19  DOS: the patient was seen and examined on 06/14/2014 PCP: Shamleffer, Madelin Rear, MD  Chief Complaint: Headache with left-sided numbness  HPI: Carly Delacruz is a 49 y.o. female with Past medical history of obesity, sleep apnea, essential hypertension, GERD, anemia, gastric bypass. The patient is presenting with complaints of left-sided headache with left-sided numbness. The symptoms have been ongoing for last 2 days. She does not have any numbness of left-sided weakness. She mentions that other than this she woke up this morning with dizziness and a Stamey head. She denies any vertigo. She denies any blurring of the vision of the time of my evaluation but she did mention about left-sided bloody vision are clear. She denies any speech difficulty. No chest pain or shortness of breath no nausea no vomiting. No diarrhea no constipation. Occasional burning urination. Does not have any alcohol abuse history on her drug abuse history. She mentions that she was initially on hydrochlorothiazide 25 mg twice a day for blood pressure later on her blood pressure was persistently elevated and she was placed on losartan 50 mg and hydrochlorothiazide 12.5 mg on March 28. Later on she started having complaints of dizziness as an outpatient and that due to low blood pressure she was taken off of hydrochlorothiazide and was placed on losartan only. She has been taking it for last 5 days.  The patient is coming from home. And at her baseline independent for most of her ADL.  Review of Systems: as mentioned in the history of present illness.  A comprehensive review of the other systems is negative.  Past Medical History  Diagnosis Date  . Fatigue 2012  . Malaise 2012  . Insomnia   . Endometriosis   . Sleep apnea 2005  . Morbid obesity 2005  . Yeast infection   . Trichomonas   . Complication of  anesthesia   . H/O varicella   . H/O mumps   . H/O bladder infections   . Insomnia secondary to situational depression 06/06/07  . Atrophic vaginitis 2009  . BV (bacterial vaginosis) 2006  . Liver mass, right lobe 08/13/04    Nodule  . Arthritis 12/10/03  . SUI (stress urinary incontinence, female) 08/12/03  . History of candidal vulvovaginitis 03/21/03  . Vulvar lesion 03/21/03  . Anemia   . Gallstones   . GERD (gastroesophageal reflux disease)   . HTN (hypertension)   . Allergy   . Osteoporosis   . S/P gastric bypass   . Chronic back pain   . Chronic LLQ pain   . Elevated LFTs    Past Surgical History  Procedure Laterality Date  . Wisdom tooth extraction    . Appendectomy  2001  . Tonsillectomy    . Cholecystectomy  2011  . Abdominal hysterectomy  2001  . Gastric bypass  2007    [facility], Dr. Daphine Deutscher  . Laparotomy    . Dilation and curettage of uterus    . Knee arthroscopy Left    Social History:  reports that she has never smoked. She has never used smokeless tobacco. She reports that she does not drink alcohol or use illicit drugs.  Allergies  Allergen Reactions  . Morphine And Related Shortness Of Breath and Itching  . Septra [Sulfamethoxazole-Trimethoprim] Anaphylaxis and Hives  . Sulfa Antibiotics Anaphylaxis, Hives and Swelling    Throat swelling  . Ultram [Tramadol] Shortness Of Breath  and Anxiety    "panic attacks"  . Ciprofloxacin Hcl   . Codeine Itching  . Darvocet [Propoxyphene N-Acetaminophen] Itching  . Demerol [Meperidine] Nausea Only  . Dilaudid [Hydromorphone Hcl] Itching  . Adhesive [Tape] Rash    Family History  Problem Relation Age of Onset  . Colitis Sister   . Diabetes Paternal Grandmother   . Leukemia Maternal Grandmother   . Arthritis Mother   . Prostate cancer Father     Prior to Admission medications   Medication Sig Start Date End Date Taking? Authorizing Provider  azithromycin (ZITHROMAX) 250 MG tablet Take 250 mg  by mouth daily. 6 Day Course.   Yes Historical Provider, MD  CALCIUM PO Take 1 tablet by mouth every morning.    Yes Historical Provider, MD  Cyanocobalamin (VITAMIN B-12 PO) Take 1 tablet by mouth every morning.    Yes Historical Provider, MD  diazepam (VALIUM) 5 MG tablet Place 5 mg vaginally daily as needed for anxiety.   Yes Historical Provider, MD  diphenhydrAMINE (BENADRYL) 25 MG tablet Take 25 mg by mouth every 6 (six) hours as needed for itching or allergies (itching).    Yes Historical Provider, MD  gabapentin (NEURONTIN) 300 MG capsule Take 300 mg by mouth 2 (two) times daily. Take 1 tablet (300 mg) in the am and Take 2 tablets (600 mg) in the pm.   Yes Historical Provider, MD  HYDROcodone-acetaminophen (NORCO/VICODIN) 5-325 MG per tablet Take 1-2 tablets by mouth every 6 (six) hours as needed for moderate pain. 08/30/13  Yes Cathren Laine, MD  LOSARTAN POTASSIUM PO Take 50 mg by mouth daily.   Yes Historical Provider, MD  Multiple Vitamins-Minerals (MULTIVITAMIN WITH MINERALS) tablet Take 1 tablet by mouth every morning.    Yes Historical Provider, MD  pantoprazole (PROTONIX) 40 MG tablet Take 40 mg by mouth 2 (two) times daily.   Yes Historical Provider, MD  tapentadol (NUCYNTA) 50 MG TABS tablet Take 50 mg by mouth every 12 (twelve) hours.   Yes Historical Provider, MD  tiZANidine (ZANAFLEX) 4 MG tablet Take 8 mg by mouth at bedtime.    Yes Historical Provider, MD  traZODone (DESYREL) 100 MG tablet Take 100 mg by mouth at bedtime.   Yes Historical Provider, MD  metroNIDAZOLE (FLAGYL) 500 MG tablet Take 1 tablet (500 mg total) by mouth 2 (two) times daily. Patient not taking: Reported on 06/14/2014 06/01/13   Felicie Morn, NP  ondansetron (ZOFRAN ODT) 8 MG disintegrating tablet Take 1 tablet (8 mg total) by mouth every 8 (eight) hours as needed for nausea or vomiting. Patient not taking: Reported on 06/14/2014 08/30/13   Cathren Laine, MD  oxyCODONE-acetaminophen (PERCOCET) 10-325 MG per tablet  Take 1 tablet by mouth every 6 (six) hours as needed for pain. Patient not taking: Reported on 06/14/2014 06/01/13   Felicie Morn, NP  oxyCODONE-acetaminophen (PERCOCET/ROXICET) 5-325 MG per tablet Take 1 tablet by mouth every 6 (six) hours as needed for severe pain. Patient not taking: Reported on 06/14/2014 05/22/13   Beverley Fiedler, MD    Physical Exam: Filed Vitals:   06/14/14 2000 06/14/14 2241 06/15/14 0500 06/15/14 0519  BP: 163/89 132/83  131/78  Pulse: 79     Temp:  98.2 F (36.8 C)  98.1 F (36.7 C)  TempSrc:  Oral  Oral  Resp: 16 20  20   Height:  5\' 4"  (1.626 m)    Weight:  109.77 kg (242 lb) 109.77 kg (242 lb)   SpO2: 98%  98%  98%    General: Alert, Awake and Oriented to Time, Place and Person. Appear in mild distress Eyes: PERRL ENT: Oral Mucosa clear moist. Neck: no JVD Cardiovascular: S1 and S2 Present, no Murmur, Peripheral Pulses Present Respiratory: Bilateral Air entry equal and Decreased,  Clear to Auscultation, noCrackles, no wheezes Abdomen: Bowel Sound present, Soft and non tender Skin: no Rash Extremities: no Pedal edema, no calf tenderness Neurologic: Grossly no focal neuro deficit.  Labs on Admission:  CBC:  Recent Labs Lab 06/14/14 1723 06/15/14 0514  WBC 7.3 6.0  NEUTROABS 4.0  --   HGB 13.0 11.8*  HCT 41.1 37.2  MCV 87.6 87.9  PLT 281 231    CMP     Component Value Date/Time   NA 139 06/15/2014 0514   K 3.6 06/15/2014 0514   CL 104 06/15/2014 0514   CO2 28 06/15/2014 0514   GLUCOSE 93 06/15/2014 0514   BUN 14 06/15/2014 0514   CREATININE 0.82 06/15/2014 0514   CALCIUM 8.6 06/15/2014 0514   PROT 6.4 06/15/2014 0514   ALBUMIN 3.6 06/15/2014 0514   AST 17 06/15/2014 0514   ALT 21 06/15/2014 0514   ALKPHOS 65 06/15/2014 0514   BILITOT 0.5 06/15/2014 0514   GFRNONAA 82* 06/15/2014 0514   GFRAA >90 06/15/2014 0514    No results for input(s): LIPASE, AMYLASE in the last 168 hours.  No results for input(s): CKTOTAL, CKMB, CKMBINDEX,  TROPONINI in the last 168 hours. BNP (last 3 results) No results for input(s): BNP in the last 8760 hours.  ProBNP (last 3 results) No results for input(s): PROBNP in the last 8760 hours.   Radiological Exams on Admission: Ct Angio Head W/cm &/or Wo Cm  06/14/2014   CLINICAL DATA:  49 year old female with dizziness, epistaxis, on blood pressure medication, recent fall. Left side numbness and jaw pain. Blurred vision, limb ataxia. Initial encounter.  EXAM: CT ANGIOGRAPHY HEAD AND NECK  TECHNIQUE: Multidetector CT imaging of the head and neck was performed using the standard protocol during bolus administration of intravenous contrast. Multiplanar CT image reconstructions and MIPs were obtained to evaluate the vascular anatomy. Carotid stenosis measurements (when applicable) are obtained utilizing NASCET criteria, using the distal internal carotid diameter as the denominator.  CONTRAST:  100 mL Omnipaque 350  COMPARISON:  Head CT without contrast 1840 hours today.  FINDINGS: CTA NECK  Skeleton: No acute osseous abnormality identified. Visualized paranasal sinuses and mastoids are clear.  Other neck: Negative lung apices. Up to 1 cm bilateral mildly hypodense thyroid nodules are visible, these do not meet size criteria for ultrasound follow-up. Larynx, pharynx, parapharyngeal spaces, retropharyngeal space, sublingual space, submandibular glands, and parotid glands are within normal limits. Visualized orbit soft tissues are within normal limits. No cervical lymphadenopathy.  Aortic arch: Aberrant origin of the right subclavian artery such that a 4 vessel arch configuration is present. No arch atherosclerosis.  Right carotid system: No right CCA origin stenosis. The right vertebral artery arises from the right CCA as seen on series 5, image 21, normal anatomic variant). Normal right carotid bifurcation. Normal cervical right ICA.  Left carotid system: No left CCA stenosis. Normal left carotid bifurcation. There  is minimal to mild soft plaque and/or irregularity of the distal cervical left ICA as seen on series 5, image 81, but no significant stenosis. Otherwise negative cervical left ICA.  Vertebral arteries:  The right vertebral artery arises from the right CCA in this patient with an aberrant origin of the  right subclavian artery.  The right vertebral artery then has a slightly late entry into the cervical transverse foramen beginning at C5. It is negative to the skullbase.  No proximal left subclavian artery stenosis. Normal left vertebral artery origin. Tortuous proximal left vertebral artery, which is otherwise normal to the skullbase.  CTA HEAD  Posterior circulation: Codominant distal vertebral arteries. Normal vertebrobasilar junction. Small left PICA and dominant appearing right AICA origins are patent. No basilar artery stenosis. SCA and PCA origins are normal. Posterior communicating arteries are diminutive or absent. Bilateral PCA branches are within normal limits.  Anterior circulation:  Normal right ICA siphon. Normal left ICA siphon. Normal right ophthalmic artery origin. Probable infundibulum at the rib left ophthalmic artery origin (series 6, image 207). Normal carotid termini, MCA and ACA origins.  Anterior communicating artery is diminutive or absent. Bilateral ACA branches are within normal limits. Left MCA branches are within normal limits. Right MCA branches are within normal limits.  Venous sinuses: Within normal limits.  Anatomic variants: Aberrant origin of the right subclavian artery. Right vertebral artery origin off of the right CCA.  Delayed phase: No abnormal enhancement identified. Stable and normal gray-white matter differentiation.  IMPRESSION: 1. Largely negative CTA head and neck, with no arterial stenosis, occlusion, or dissection. 2. Minimal irregularity of the distal cervical left ICA. Probable small infundibulum at the origin of the left ophthalmic artery. 3. 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 4. Stable and negative CT appearance of the head.   Electronically Signed   By: Odessa Fleming M.D.   On: 06/14/2014 19:20   Ct Head Wo Contrast  06/14/2014   CLINICAL DATA:  Dizziness. Nosebleeds. Hypertension. Blurred vision.  EXAM: CT HEAD WITHOUT CONTRAST  TECHNIQUE: Contiguous axial images were obtained from the base of the skull through the vertex without intravenous contrast.  COMPARISON:  10/22/2010  FINDINGS: The brain has a normal appearance without evidence of atrophy, infarction, mass lesion, hemorrhage, hydrocephalus or extra-axial collection. The calvarium is unremarkable. The paranasal sinuses, middle ears and mastoids are clear.  IMPRESSION: Normal head CT   Electronically Signed   By: Paulina Fusi M.D.   On: 06/14/2014 18:48   Ct Angio Neck W/cm &/or Wo/cm  06/14/2014   CLINICAL DATA:  49 year old female with dizziness, epistaxis, on blood pressure medication, recent fall. Left side numbness and jaw pain. Blurred vision, limb ataxia. Initial encounter.  EXAM: CT ANGIOGRAPHY HEAD AND NECK  TECHNIQUE: Multidetector CT imaging of the head and neck was performed using the standard protocol during bolus administration of intravenous contrast. Multiplanar CT image reconstructions and MIPs were obtained to evaluate the vascular anatomy. Carotid stenosis measurements (when applicable) are obtained utilizing NASCET criteria, using the distal internal carotid diameter as the denominator.  CONTRAST:  100 mL Omnipaque 350  COMPARISON:  Head CT without contrast 1840 hours today.  FINDINGS: CTA NECK  Skeleton: No acute osseous abnormality identified. Visualized paranasal sinuses and mastoids are clear.  Other neck: Negative lung apices. Up to 1 cm bilateral mildly hypodense thyroid nodules are visible, these do not meet size criteria for ultrasound follow-up. Larynx, pharynx, parapharyngeal spaces, retropharyngeal space, sublingual space,  submandibular glands, and parotid glands are within normal limits. Visualized orbit soft tissues are within normal limits. No cervical lymphadenopathy.  Aortic arch: Aberrant origin of the right subclavian artery such that a 4 vessel arch configuration is present. No arch atherosclerosis.  Right carotid system: No right CCA  origin stenosis. The right vertebral artery arises from the right CCA as seen on series 5, image 21, normal anatomic variant). Normal right carotid bifurcation. Normal cervical right ICA.  Left carotid system: No left CCA stenosis. Normal left carotid bifurcation. There is minimal to mild soft plaque and/or irregularity of the distal cervical left ICA as seen on series 5, image 81, but no significant stenosis. Otherwise negative cervical left ICA.  Vertebral arteries:  The right vertebral artery arises from the right CCA in this patient with an aberrant origin of the right subclavian artery.  The right vertebral artery then has a slightly late entry into the cervical transverse foramen beginning at C5. It is negative to the skullbase.  No proximal left subclavian artery stenosis. Normal left vertebral artery origin. Tortuous proximal left vertebral artery, which is otherwise normal to the skullbase.  CTA HEAD  Posterior circulation: Codominant distal vertebral arteries. Normal vertebrobasilar junction. Small left PICA and dominant appearing right AICA origins are patent. No basilar artery stenosis. SCA and PCA origins are normal. Posterior communicating arteries are diminutive or absent. Bilateral PCA branches are within normal limits.  Anterior circulation:  Normal right ICA siphon. Normal left ICA siphon. Normal right ophthalmic artery origin. Probable infundibulum at the rib left ophthalmic artery origin (series 6, image 207). Normal carotid termini, MCA and ACA origins.  Anterior communicating artery is diminutive or absent. Bilateral ACA branches are within normal limits. Left MCA branches  are within normal limits. Right MCA branches are within normal limits.  Venous sinuses: Within normal limits.  Anatomic variants: Aberrant origin of the right subclavian artery. Right vertebral artery origin off of the right CCA.  Delayed phase: No abnormal enhancement identified. Stable and normal gray-white matter differentiation.  IMPRESSION: 1. Largely negative CTA head and neck, with no arterial stenosis, occlusion, or dissection. 2. Minimal irregularity of the distal cervical left ICA. Probable small infundibulum at the origin of the left ophthalmic artery. 3. 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 4. Stable and negative CT appearance of the head.   Electronically Signed   By: Odessa Fleming M.D.   On: 06/14/2014 19:20   Mr Brain Wo Contrast  06/14/2014   CLINICAL DATA:  49 year old female with dizziness, epistaxis, on blood pressure medication, recent fall. Left side numbness and jaw pain. Blurred vision, limb ataxia.  Initial encounter.  EXAM: MRI HEAD WITHOUT CONTRAST  TECHNIQUE: Multiplanar, multiecho pulse sequences of the brain and surrounding structures were obtained without intravenous contrast.  COMPARISON:  CTA head and neck from 1848 hours the same day  FINDINGS: No restricted diffusion or evidence of acute infarction. Major intracranial vascular flow voids are within normal limits.  Cerebral volume is within normal limits for age. No midline shift, mass effect, evidence of mass lesion, extra-axial collection or acute intracranial hemorrhage. Cervicomedullary junction and pituitary are within normal limits.  There is a small web or adhesion suspected in the frontal horn of the right lateral ventricle (series 6, image 15), but no ventriculomegaly and this most likely is congenital.  Wallace Cullens and white matter signal is within normal limits for age throughout the brain.  Visible internal auditory structures appear normal. Mastoids are clear. Trace  paranasal sinus mucosal thickening. Visualized orbit soft tissues are within normal limits. Visualized scalp soft tissues are within normal limits. Normal bone marrow signal. Negative visualized cervical spine.  IMPRESSION: No acute intracranial abnormality. Negative for age noncontrast MRI appearance of the brain.  Electronically Signed   By: Odessa FlemingH  Hall M.D.   On: 06/14/2014 22:17   EKG: Independently reviewed. normal sinus rhythm, nonspecific ST and T waves changes.  Assessment/Plan Principal Problem:   Dizziness Active Problems:   Back pain   History of gastric bypass   Obesity   Hyperkalemia   GERD (gastroesophageal reflux disease)   Neuropathy   Headache   Essential hypertension   1. Dizziness The patient is presenting with numbness of dizziness as well as left-sided numbness. CT of the head as well as MRI of the brain are negative for any acute abnormality suggesting her symptoms are less likely secondary to stroke. Patient continues to have the numbness on the left side on the face and most likely this is secondary to complex migraine. We will continue treating her headache with symptomatically management. Check orthostatic vitals.  2. Hypertension. The patient has been undergoing recent changes in her hypertensive medications. Will check orthostatic vitals. Currently holding blood pressure medications.  3. Hyperkalemia. Initial potassium was significantly elevated. EKG does not show any evidence of significant arrhythmia. Patient was recently placed on only losartan which could have been potential cause. Monitor serial potassium level. Holding losartan.  4. GERD. Continuing twice a day PPI.  5. Chronic neuropathy. Patient is on gabapentin with which I'll continue. Gabapentin occasionally causes dizziness.   Consults: Phone consultation with neurology  DVT Prophylaxis: subcutaneous Heparin Nutrition: Nothing by mouth pending stroke evaluation  Disposition:  Admitted as observation, telemetry unit.  Author: Lynden OxfordPranav Kelvon Giannini, MD [facility] Pager: [phone] If 7PM-7AM, please contact night-coverage [email or web address] Password [email or web address]

## 2014-06-15 NOTE — Progress Notes (Signed)
Patient ID: Carly Delacruz, female   DOB: 10/11/1965, 49 y.o.   MRN: [id number] [facility] PROGRESS NOTE  Carly Delacruz WUJ:[id number] DOB: 07/19/1965 DOA: 06/14/2014 PCP: Clelia SchaumannShamleffer, Ibethal J, MD  Brief narrative:    49 y.o. female with past medical history of obesity, sleep apnea, essential hypertension, GERD, gastric bypass who presented with concern for left sided facial numbness and frontal headache for past 2 days prior to this admission. No specific aggravating or alleviating factors. No slurred speech or facial asymmetry. No fevers or chills. She had transient dizziness which now resolved. CT head and cervical spine showed no acute intracranial findings. MRI brain did not reveal acute infarction or other findings to explain numbness or headache.  Assessment/Plan:    Principal Problem: Left side facial numbness / frontal headache / Complex migraine - No previous h/o migraine headaches but considering her sign/symptoms pt may have migraine - CT head, cervical spine and MRI brain with no acute intracranial findings to explain numbness or headache - Spoke with neuro on call; recommended Toradol and compazine and if no improvement in 5 hours to call for neuro consult   Active Problems:   Back pain - Continue current analgesia as needed, gabapentin - Follows with pain clinic outpt     Hyperkalemia - Unclear etiology - Resolved spontaneously    Essential hypertension - She had BP as high as 180/100 but this subsequently improved to 131/78. - Monitor BP. Not on BP meds at this time. Added hydralazine PRN 10 mg IV Q 6 hours for BP above 140/90.     GERD - Continue PPI therapy    DVT Prophylaxis  - Lovenox subQ ordered    Code Status: Full.  Family Communication:  plan of care discussed with the patient Disposition Plan: still with facial numbness and headache. Per neuro see if headache and numbness improves with toradol and compazine and if not pt will need neuro  consult.   IV access:  Peripheral IV  Procedures and diagnostic studies:    Ct Angio Head W/cm &/or Wo Cm 06/14/2014  1. Largely negative CTA head and neck, with no arterial stenosis, occlusion, or dissection. 2. Minimal irregularity of the distal cervical left ICA. Probable small infundibulum at the origin of the left ophthalmic artery. 3. 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 4. Stable and negative CT appearance of the head.   Electronically Signed   By: Odessa FlemingH  Hall M.D.   On: 06/14/2014 19:20   Ct Head Wo Contrast 06/14/2014  Normal head CT   Electronically Signed   By: Paulina FusiMark  Shogry M.D.   On: 06/14/2014 18:48   Ct Angio Neck W/cm &/or Wo/cm 06/14/2014   1. Largely negative CTA head and neck, with no arterial stenosis, occlusion, or dissection. 2. Minimal irregularity of the distal cervical left ICA. Probable small infundibulum at the origin of the left ophthalmic artery. 3. 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 4. Stable and negative CT appearance of the head.   Electronically Signed   By: Odessa FlemingH  Hall M.D.   On: 06/14/2014 19:20   Mr Brain Wo Contrast 06/14/2014   No acute intracranial abnormality. Negative for age noncontrast MRI appearance of the brain.   Electronically Signed   By: Odessa FlemingH  Hall M.D.   On: 06/14/2014 22:17    Medical Consultants:  Neurology - phone call only  Other Consultants:  None  IAnti-Infectives:   None    Manson Passey, MD  [facility] Pager [phone]  Time spent in minutes: 25 minutes  If 7PM-7AM, please contact night-coverage [email or web address] Password [facility] 06/15/2014, 12:13 PM    HPI/Subjective: No acute overnight events. Still has numbness over left face, frontal headache, 5/10 in intensity.   Objective: Filed Vitals:   06/14/14 2000 06/14/14 2241 06/15/14 0500 06/15/14 0519  BP: 163/89 132/83  131/78  Pulse: 79     Temp:   98.2 F (36.8 C)  98.1 F (36.7 C)  TempSrc:  Oral  Oral  Resp: Height:   (1.626 m)    Weight:  109.77 kg (242 lb) 109.77 kg (242 lb)   SpO2: 98% 98%  98%    Intake/Output Summary (Last 24 hours) at 06/15/14 1213 Last data filed at 06/15/14 0855  Gross per 24 hour  Intake    720 ml  Output    700 ml  Net     20 ml    Exam:   General:  Pt is alert, follows commands appropriately, not in acute distress  Cardiovascular: Regular rate and rhythm, S1/S2, no murmurs  Respiratory: Clear to auscultation bilaterally, no wheezing, no crackles, no rhonchi  Abdomen: Soft, non tender, non distended, bowel sounds present  Extremities: No edema, pulses DP and PT palpable bilaterally  Neuro: numbness over left side of the face, no sensation loss  Data Reviewed: Basic Metabolic Panel:  Recent Labs Lab 06/14/14 1723 06/14/14 2019 06/15/14 0514  NA 137  --  139  K 6.6* 4.1 3.6  CL 102  --  104  CO2 27  --  28  GLUCOSE 92  --  93  BUN 16  --  14  CREATININE 1.01  --  0.82  CALCIUM 9.0  --  8.6   Liver Function Tests:  Recent Labs Lab 06/15/14 0514  AST 17  ALT 21  ALKPHOS 65  BILITOT 0.5  PROT 6.4  ALBUMIN 3.6   No results for input(s): LIPASE, AMYLASE in the last 168 hours. No results for input(s): AMMONIA in the last 168 hours. CBC:  Recent Labs Lab 06/14/14 1723 06/15/14 0514  WBC 7.3 6.0  NEUTROABS 4.0  --   HGB 13.0 11.8*  HCT 41.1 37.2  MCV 87.6 87.9  PLT 281 231   Cardiac Enzymes: No results for input(s): CKTOTAL, CKMB, CKMBINDEX, TROPONINI in the last 168 hours. BNP: Invalid input(s): POCBNP CBG: No results for input(s): GLUCAP in the last 168 hours.  No results found for this or any previous visit (from the past 240 hour(s)).   Scheduled Meds: . enoxaparin (LOVENOX) injection  40 mg Subcutaneous QHS  . gabapentin  300 mg Oral Daily  . gabapentin  600 mg Oral QHS  . ketorolac  15 mg Intravenous 4 times per day  .  pantoprazole  40 mg Oral BID  . tapentadol  50 mg Oral Q12H  . tiZANidine  8 mg Oral QHS  . traZODone  100 mg Oral QHS

## 2014-06-15 NOTE — Progress Notes (Signed)
UR completed 

## 2014-06-16 DIAGNOSIS — E875 Hyperkalemia: Secondary | ICD-10-CM | POA: Diagnosis not present

## 2014-06-16 DIAGNOSIS — G43009 Migraine without aura, not intractable, without status migrainosus: Secondary | ICD-10-CM | POA: Diagnosis not present

## 2014-06-16 DIAGNOSIS — I10 Essential (primary) hypertension: Secondary | ICD-10-CM | POA: Diagnosis not present

## 2014-06-16 DIAGNOSIS — K219 Gastro-esophageal reflux disease without esophagitis: Secondary | ICD-10-CM | POA: Diagnosis not present

## 2014-06-16 MED ORDER — PROCHLORPERAZINE MALEATE 5 MG PO TABS: 5.0000 mg | ORAL_TABLET | Freq: Four times a day (QID) | ORAL | Status: DC | PRN

## 2014-06-16 NOTE — Progress Notes (Signed)
UR completed 

## 2014-06-16 NOTE — Discharge Instructions (Signed)
Cluster Headache Cluster headaches are recognized by their pattern of deep, intense head pain. They normally occur on one side of your head, but they may "switch sides" in subsequent episodes. Typically, cluster headaches:   Are severe in nature.   Occur repeatedly over weeks to months and are followed by periods of no headaches.   Can last from 15 minutes to 3 hours.   Occur at the same time each day, often at night.   Occur several times a day. CAUSES The exact cause of cluster headaches is not known. Alcohol use may be associated with cluster headaches. SIGNS AND SYMPTOMS   Severe pain that begins in or around your eye or temple.   One-sided head pain.   Feeling sick to your stomach (nauseous).   Sensitivity to light.   Runny nose.   Eye redness, tearing, and nasal stuffiness on the side of your head where you are experiencing pain.   Sweaty, pale skin of the face.   Droopy or swollen eyelid.   Restlessness. DIAGNOSIS  Cluster headaches are diagnosed based on symptoms and a physical exam. Your health care provider may order a CT scan or an MRI of your head or lab tests to see if your headaches are caused by other medical conditions.  TREATMENT   Medicines for pain relief and to prevent recurrent attacks. Some people may need a combination of medicines.  Oxygen for pain relief.   Biofeedback programs to help reduce headache pain.  It may be helpful to keep a headache diary. This may help you find a trend for what is triggering your headaches. Your health care provider can develop a treatment plan.  HOME CARE INSTRUCTIONS  During cluster periods:   Follow a regular sleep schedule. Do not vary the amount and time that you sleep from day to day. It is important to stay on the same schedule during a cluster period to help prevent headaches.   Avoid alcohol.   Stop smoking if you smoke.  SEEK MEDICAL CARE IF:  You have any changes from your previous  cluster headaches either in intensity or frequency.   You are not getting relief from medicines you are taking.  SEEK IMMEDIATE MEDICAL CARE IF:   You faint.   You have weakness or numbness, especially on one side of your body or face.   You have double vision.   You have nausea or vomiting that is not relieved within several hours.   You cannot keep your balance or have difficulty talking or walking.   You have neck pain or stiffness.   You have a fever. MAKE SURE YOU:  Understand these instructions.   Will watch your condition.   Will get help right away if you are not doing well or get worse. Document Released: 02/15/2005 Document Revised: 12/06/2012 Document Reviewed: 09/07/2012 [facility] Patient Information 2015 [place], [place]. This information is not intended to replace advice given to you by your health care provider. Make sure you discuss any questions you have with your health care provider. Migraine Headache A migraine headache is an intense, throbbing pain on one or both sides of your head. A migraine can last for 30 minutes to several hours. CAUSES  The exact cause of a migraine headache is not always known. However, a migraine may be caused when nerves in the brain become irritated and release chemicals that cause inflammation. This causes pain. Certain things may also trigger migraines, such as:  Alcohol.  Smoking.  Stress.  Menstruation.  Aged cheeses.  Foods or drinks that contain nitrates, glutamate, aspartame, or tyramine.  Lack of sleep.  Chocolate.  Caffeine.  Hunger.  Physical exertion.  Fatigue.  Medicines used to treat chest pain (nitroglycerine), birth control pills, estrogen, and some blood pressure medicines. SIGNS AND SYMPTOMS  Pain on one or both sides of your head.  Pulsating or throbbing pain.  Severe pain that prevents daily activities.  Pain that is aggravated by any physical activity.  Nausea, vomiting, or  both.  Dizziness.  Pain with exposure to bright lights, loud noises, or activity.  General sensitivity to bright lights, loud noises, or smells. Before you get a migraine, you may get warning signs that a migraine is coming (aura). An aura may include:  Seeing flashing lights.  Seeing bright spots, halos, or zigzag lines.  Having tunnel vision or blurred vision.  Having feelings of numbness or tingling.  Having trouble talking.  Having muscle weakness. DIAGNOSIS  A migraine headache is often diagnosed based on:  Symptoms.  Physical exam.  A CT scan or MRI of your head. These imaging tests cannot diagnose migraines, but they can help rule out other causes of headaches. TREATMENT Medicines may be given for pain and nausea. Medicines can also be given to help prevent recurrent migraines.  HOME CARE INSTRUCTIONS  Only take over-the-counter or prescription medicines for pain or discomfort as directed by your health care provider. The use of long-term narcotics is not recommended.  Lie down in a dark, quiet room when you have a migraine.  Keep a journal to find out what may trigger your migraine headaches. For example, write down:  What you eat and drink.  How much sleep you get.  Any change to your diet or medicines.  Limit alcohol consumption.  Quit smoking if you smoke.  Get 7-9 hours of sleep, or as recommended by your health care provider.  Limit stress.  Keep lights dim if bright lights bother you and make your migraines worse. SEEK IMMEDIATE MEDICAL CARE IF:   Your migraine becomes severe.  You have a fever.  You have a stiff neck.  You have vision loss.  You have muscular weakness or loss of muscle control.  You start losing your balance or have trouble walking.  You feel faint or pass out.  You have severe symptoms that are different from your first symptoms. MAKE SURE YOU:   Understand these instructions.  Will watch your  condition.  Will get help right away if you are not doing well or get worse. Document Released: 02/15/2005 Document Revised: 07/02/2013 Document Reviewed: 10/23/2012 [facility] Patient Information 2015 [place], [place]. This information is not intended to replace advice given to you by your health care provider. Make sure you discuss any questions you have with your health care provider.

## 2014-06-16 NOTE — Discharge Summary (Addendum)
Physician Discharge Summary  Carly Delacruz:[id number] DOB: [birth year] DOA: 06/14/2014  PCP: Clelia Schaumann, MD  Admit date: 06/14/2014 Discharge date: 06/16/2014  Recommendations for Outpatient Follow-up:  1. Pt will follow up with PCP in 1 week after discharge to make sure symptoms are stable. Added compazine 5 mg PRN since pt reported some improvement in symptoms.   Discharge Diagnoses:  Principal Problem:   Dizziness Active Problems:   Back pain   History of gastric bypass   Obesity   Hyperkalemia   GERD (gastroesophageal reflux disease)   Neuropathy   Headache   Essential hypertension    Discharge Condition: stable   Diet recommendation: as tolerated   History of present illness:  49 y.o. female with past medical history of obesity, sleep apnea, essential hypertension, GERD, gastric bypass who presented with concern for left sided facial numbness and frontal headache for past 2 days prior to this admission. No specific aggravating or alleviating factors. No slurred speech or facial asymmetry. No fevers or chills. She had transient dizziness which has spontaneously resolved.  CT head and cervical spine showed no acute intracranial findings. MRI brain did not reveal acute infarction or other findings to explain numbness or headache.  Assessment/Plan:    Principal Problem: Left side facial numbness / frontal headache / Complex migraine - Per neuro - started Toradol and compazine and pt reports feeling better. She does not want tramadol on d/c. Ok with compazine on D/C. - CT head, cervical spine and MRI brain with no acute intracranial findings to explain numbness or headache   Active Problems:  Back pain - Continue gabapentin  - Continue pain meds per home regimen. She follows with pain management clinic.   Hyperkalemia - Unclear etiology - Spontaneously normalized.    Essential hypertension - She had BP as high as 180/100 but this subsequently  improved to 131/78. - She was only on PRN hydralazine and BP is now WNL. No need for BP meds at this time. PCP can recheck BP and decide if she need BP meds.    GERD - Continue home meds.    DVT Prophylaxis  - Lovenox subQ ordered whiel pt is in hospital.    Code Status: Full.    IV access:  Peripheral IV  Procedures and diagnostic studies:   Ct Angio Head W/cm &/or Wo Cm 06/14/2014 1. Largely negative CTA head and neck, with no arterial stenosis, occlusion, or dissection. 2. Minimal irregularity of the distal cervical left ICA. Probable small infundibulum at the origin of the left ophthalmic artery. 3. 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 4. Stable and negative CT appearance of the head. Electronically Signed By: Odessa Fleming M.D. On: 06/14/2014 19:20   Ct Head Wo Contrast 06/14/2014 Normal head CT Electronically Signed By: Paulina Fusi M.D. On: 06/14/2014 18:48   Ct Angio Neck W/cm &/or Wo/cm 06/14/2014 1. Largely negative CTA head and neck, with no arterial stenosis, occlusion, or dissection. 2. Minimal irregularity of the distal cervical left ICA. Probable small infundibulum at the origin of the left ophthalmic artery. 3. 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 4. Stable and negative CT appearance of the head. Electronically Signed By: Odessa Fleming M.D. On: 06/14/2014 19:20   Mr Brain Wo Contrast 06/14/2014 No acute intracranial abnormality. Negative for age noncontrast MRI appearance of the brain. Electronically Signed By: Odessa Fleming M.D. On: 06/14/2014 22:17  Medical Consultants:  Neurology - phone call only  Other Consultants:  None   IAnti-Infectives:   None     Signed:  Manson Passey, MD  [facility] 06/16/2014, 10:33 AM  Pager #: [phone]  Time spent in minutes: more than 30  minutes  Discharge Exam: Filed Vitals:   06/15/14 2226  BP: 127/90  Pulse: 75  Temp:   Resp:    Filed Vitals:   06/15/14 0519 06/15/14 1435 06/15/14 2041 06/15/14 2226  BP: 131/78 138/80 129/98 127/90  Pulse:  92 67 75  Temp: 98.1 F (36.7 C) 98.3 F (36.8 C) 98.2 F (36.8 C)   TempSrc: Oral Oral Oral   Resp: Height:      Weight:      SpO2: 98% 98% 98%     General: Pt is alert, follows commands appropriately, not in acute distress Cardiovascular: Regular rate and rhythm, S1/S2 +, no murmurs Respiratory: Clear to auscultation bilaterally, no wheezing, no crackles, no rhonchi Abdominal: Soft, non tender, non distended, bowel sounds +, no guarding Extremities: no edema, no cyanosis, pulses palpable bilaterally DP and PT Neuro: Grossly nonfocal  Discharge Instructions  Discharge Instructions    Call MD for:  difficulty breathing, headache or visual disturbances    Complete by:  As directed      Call MD for:  persistant nausea and vomiting    Complete by:  As directed      Call MD for:  severe uncontrolled pain    Complete by:  As directed      Diet - low sodium heart healthy    Complete by:  As directed      Increase activity slowly    Complete by:  As directed             Medication List    STOP taking these medications        azithromycin 250 MG tablet  Commonly known as:  ZITHROMAX     metroNIDAZOLE 500 MG tablet  Commonly known as:  FLAGYL     ondansetron 8 MG disintegrating tablet  Commonly known as:  ZOFRAN ODT     oxyCODONE-acetaminophen 10-325 MG per tablet  Commonly known as:  PERCOCET     oxyCODONE-acetaminophen 5-325 MG per tablet  Commonly known as:  PERCOCET/ROXICET      TAKE these medications        CALCIUM PO  Take 1 tablet by mouth every morning.     diazepam 5 MG tablet  Commonly known as:  VALIUM  Place 5 mg vaginally daily as needed for anxiety.     diphenhydrAMINE 25 MG tablet  Commonly known as:  BENADRYL   Take 25 mg by mouth every 6 (six) hours as needed for itching or allergies (itching).     gabapentin 300 MG capsule  Commonly known as:  NEURONTIN  Take 300 mg by mouth 2 (two) times daily. Take 1 tablet (300 mg) in the am and Take 2 tablets (600 mg) in the pm.     HYDROcodone-acetaminophen 5-325 MG per tablet  Commonly known as:  NORCO/VICODIN  Take 1-2 tablets by mouth every 6 (six) hours as needed for moderate pain.     LOSARTAN POTASSIUM PO  Take 50 mg by mouth daily.     multivitamin with minerals tablet  Take 1 tablet by mouth every morning.     pantoprazole 40 MG tablet  Commonly known as:  PROTONIX  Take 40 mg  by mouth 2 (two) times daily.     prochlorperazine 5 MG tablet  Commonly known as:  COMPAZINE  Take 1 tablet (5 mg total) by mouth every 6 (six) hours as needed for nausea or vomiting.     tapentadol 50 MG Tabs tablet  Commonly known as:  NUCYNTA  Take 50 mg by mouth every 12 (twelve) hours.     tiZANidine 4 MG tablet  Commonly known as:  ZANAFLEX  Take 8 mg by mouth at bedtime.     traZODone 100 MG tablet  Commonly known as:  DESYREL  Take 100 mg by mouth at bedtime.     VITAMIN B-12 PO  Take 1 tablet by mouth every morning.           Follow-up Information    Follow up with Shamleffer, Madelin RearIbethal J, MD. Schedule an appointment as soon as possible for a visit in 1 week.   Specialty:  Internal Medicine   Why:  Follow up appt after recent hospitalization   Contact information:   [address]  [address] [place] [address] [phone]        The results of significant diagnostics from this hospitalization (including imaging, microbiology, ancillary and laboratory) are listed below for reference.    Significant Diagnostic Studies: Ct Angio Head W/cm &/or Wo Cm  06/14/2014   CLINICAL DATA:  49 year old female with dizziness, epistaxis, on blood pressure medication, recent fall. Left side numbness and jaw pain. Blurred vision, limb ataxia.  Initial encounter.  EXAM: CT ANGIOGRAPHY HEAD AND NECK  TECHNIQUE: Multidetector CT imaging of the head and neck was performed using the standard protocol during bolus administration of intravenous contrast. Multiplanar CT image reconstructions and MIPs were obtained to evaluate the vascular anatomy. Carotid stenosis measurements (when applicable) are obtained utilizing NASCET criteria, using the distal internal carotid diameter as the denominator.  CONTRAST:  100 mL Omnipaque 350  COMPARISON:  Head CT without contrast 1840 hours today.  FINDINGS: CTA NECK  Skeleton: No acute osseous abnormality identified. Visualized paranasal sinuses and mastoids are clear.  Other neck: Negative lung apices. Up to 1 cm bilateral mildly hypodense thyroid nodules are visible, these do not meet size criteria for ultrasound follow-up. Larynx, pharynx, parapharyngeal spaces, retropharyngeal space, sublingual space, submandibular glands, and parotid glands are within normal limits. Visualized orbit soft tissues are within normal limits. No cervical lymphadenopathy.  Aortic arch: Aberrant origin of the right subclavian artery such that a 4 vessel arch configuration is present. No arch atherosclerosis.  Right carotid system: No right CCA origin stenosis. The right vertebral artery arises from the right CCA as seen on series 5, image 21, normal anatomic variant). Normal right carotid bifurcation. Normal cervical right ICA.  Left carotid system: No left CCA stenosis. Normal left carotid bifurcation. There is minimal to mild soft plaque and/or irregularity of the distal cervical left ICA as seen on series 5, image 81, but no significant stenosis. Otherwise negative cervical left ICA.  Vertebral arteries:  The right vertebral artery arises from the right CCA in this patient with an aberrant origin of the right subclavian artery.  The right vertebral artery then has a slightly late entry into the cervical transverse foramen beginning at C5. It  is negative to the skullbase.  No proximal left subclavian artery stenosis. Normal left vertebral artery origin. Tortuous proximal left vertebral artery, which is otherwise normal to the skullbase.  CTA HEAD  Posterior circulation: Codominant distal vertebral arteries. Normal vertebrobasilar junction.  Small left PICA and dominant appearing right AICA origins are patent. No basilar artery stenosis. SCA and PCA origins are normal. Posterior communicating arteries are diminutive or absent. Bilateral PCA branches are within normal limits.  Anterior circulation:  Normal right ICA siphon. Normal left ICA siphon. Normal right ophthalmic artery origin. Probable infundibulum at the rib left ophthalmic artery origin (series 6, image 207). Normal carotid termini, MCA and ACA origins.  Anterior communicating artery is diminutive or absent. Bilateral ACA branches are within normal limits. Left MCA branches are within normal limits. Right MCA branches are within normal limits.  Venous sinuses: Within normal limits.  Anatomic variants: Aberrant origin of the right subclavian artery. Right vertebral artery origin off of the right CCA.  Delayed phase: No abnormal enhancement identified. Stable and normal gray-white matter differentiation.  IMPRESSION: 1. Largely negative CTA head and neck, with no arterial stenosis, occlusion, or dissection. 2. Minimal irregularity of the distal cervical left ICA. Probable small infundibulum at the origin of the left ophthalmic artery. 3. 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 4. Stable and negative CT appearance of the head.   Electronically Signed   By: Odessa Fleming M.D.   On: 06/14/2014 19:20   Ct Head Wo Contrast  06/14/2014   CLINICAL DATA:  Dizziness. Nosebleeds. Hypertension. Blurred vision.  EXAM: CT HEAD WITHOUT CONTRAST  TECHNIQUE: Contiguous axial images were obtained from the base of the skull through the vertex without  intravenous contrast.  COMPARISON:  10/22/2010  FINDINGS: The brain has a normal appearance without evidence of atrophy, infarction, mass lesion, hemorrhage, hydrocephalus or extra-axial collection. The calvarium is unremarkable. The paranasal sinuses, middle ears and mastoids are clear.  IMPRESSION: Normal head CT   Electronically Signed   By: Paulina Fusi M.D.   On: 06/14/2014 18:48   Ct Angio Neck W/cm &/or Wo/cm  06/14/2014   CLINICAL DATA:  49 year old female with dizziness, epistaxis, on blood pressure medication, recent fall. Left side numbness and jaw pain. Blurred vision, limb ataxia. Initial encounter.  EXAM: CT ANGIOGRAPHY HEAD AND NECK  TECHNIQUE: Multidetector CT imaging of the head and neck was performed using the standard protocol during bolus administration of intravenous contrast. Multiplanar CT image reconstructions and MIPs were obtained to evaluate the vascular anatomy. Carotid stenosis measurements (when applicable) are obtained utilizing NASCET criteria, using the distal internal carotid diameter as the denominator.  CONTRAST:  100 mL Omnipaque 350  COMPARISON:  Head CT without contrast 1840 hours today.  FINDINGS: CTA NECK  Skeleton: No acute osseous abnormality identified. Visualized paranasal sinuses and mastoids are clear.  Other neck: Negative lung apices. Up to 1 cm bilateral mildly hypodense thyroid nodules are visible, these do not meet size criteria for ultrasound follow-up. Larynx, pharynx, parapharyngeal spaces, retropharyngeal space, sublingual space, submandibular glands, and parotid glands are within normal limits. Visualized orbit soft tissues are within normal limits. No cervical lymphadenopathy.  Aortic arch: Aberrant origin of the right subclavian artery such that a 4 vessel arch configuration is present. No arch atherosclerosis.  Right carotid system: No right CCA origin stenosis. The right vertebral artery arises from the right CCA as seen on series 5, image 21, normal  anatomic variant). Normal right carotid bifurcation. Normal cervical right ICA.  Left carotid system: No left CCA stenosis. Normal left carotid bifurcation. There is minimal to mild soft plaque and/or irregularity of the distal cervical left ICA as seen on series 5, image 81, but no  significant stenosis. Otherwise negative cervical left ICA.  Vertebral arteries:  The right vertebral artery arises from the right CCA in this patient with an aberrant origin of the right subclavian artery.  The right vertebral artery then has a slightly late entry into the cervical transverse foramen beginning at C5. It is negative to the skullbase.  No proximal left subclavian artery stenosis. Normal left vertebral artery origin. Tortuous proximal left vertebral artery, which is otherwise normal to the skullbase.  CTA HEAD  Posterior circulation: Codominant distal vertebral arteries. Normal vertebrobasilar junction. Small left PICA and dominant appearing right AICA origins are patent. No basilar artery stenosis. SCA and PCA origins are normal. Posterior communicating arteries are diminutive or absent. Bilateral PCA branches are within normal limits.  Anterior circulation:  Normal right ICA siphon. Normal left ICA siphon. Normal right ophthalmic artery origin. Probable infundibulum at the rib left ophthalmic artery origin (series 6, image 207). Normal carotid termini, MCA and ACA origins.  Anterior communicating artery is diminutive or absent. Bilateral ACA branches are within normal limits. Left MCA branches are within normal limits. Right MCA branches are within normal limits.  Venous sinuses: Within normal limits.  Anatomic variants: Aberrant origin of the right subclavian artery. Right vertebral artery origin off of the right CCA.  Delayed phase: No abnormal enhancement identified. Stable and normal gray-white matter differentiation.  IMPRESSION: 1. Largely negative CTA head and neck, with no arterial stenosis, occlusion, or  dissection. 2. Minimal irregularity of the distal cervical left ICA. Probable small infundibulum at the origin of the left ophthalmic artery. 3. 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 4. Stable and negative CT appearance of the head.   Electronically Signed   By: Odessa Fleming M.D.   On: 06/14/2014 19:20   Mr Brain Wo Contrast  06/14/2014   CLINICAL DATA:  49 year old female with dizziness, epistaxis, on blood pressure medication, recent fall. Left side numbness and jaw pain. Blurred vision, limb ataxia.  Initial encounter.  EXAM: MRI HEAD WITHOUT CONTRAST  TECHNIQUE: Multiplanar, multiecho pulse sequences of the brain and surrounding structures were obtained without intravenous contrast.  COMPARISON:  CTA head and neck from 1848 hours the same day  FINDINGS: No restricted diffusion or evidence of acute infarction. Major intracranial vascular flow voids are within normal limits.  Cerebral volume is within normal limits for age. No midline shift, mass effect, evidence of mass lesion, extra-axial collection or acute intracranial hemorrhage. Cervicomedullary junction and pituitary are within normal limits.  There is a small web or adhesion suspected in the frontal horn of the right lateral ventricle (series 6, image 15), but no ventriculomegaly and this most likely is congenital.  Wallace Cullens and white matter signal is within normal limits for age throughout the brain.  Visible internal auditory structures appear normal. Mastoids are clear. Trace paranasal sinus mucosal thickening. Visualized orbit soft tissues are within normal limits. Visualized scalp soft tissues are within normal limits. Normal bone marrow signal. Negative visualized cervical spine.  IMPRESSION: No acute intracranial abnormality. Negative for age noncontrast MRI appearance of the brain.   Electronically Signed   By: Odessa Fleming M.D.   On: 06/14/2014 22:17    Microbiology: No results found for  this or any previous visit (from the past 240 hour(s)).   Labs: Basic Metabolic Panel:  Recent Labs Lab 06/14/14 1723 06/14/14 2019 06/15/14 0514  NA 137  --  139  K 6.6* 4.1 3.6  CL 102  --  104  CO2 27  --  28  GLUCOSE 92  --  93  BUN 16  --  14  CREATININE 1.01  --  0.82  CALCIUM 9.0  --  8.6   Liver Function Tests:  Recent Labs Lab 06/15/14 0514  AST 17  ALT 21  ALKPHOS 65  BILITOT 0.5  PROT 6.4  ALBUMIN 3.6   No results for input(s): LIPASE, AMYLASE in the last 168 hours. No results for input(s): AMMONIA in the last 168 hours. CBC:  Recent Labs Lab 06/14/14 1723 06/15/14 0514  WBC 7.3 6.0  NEUTROABS 4.0  --   HGB 13.0 11.8*  HCT 41.1 37.2  MCV 87.6 87.9  PLT 281 231   Cardiac Enzymes: No results for input(s): CKTOTAL, CKMB, CKMBINDEX, TROPONINI in the last 168 hours. BNP: BNP (last 3 results) No results for input(s): BNP in the last 8760 hours.  ProBNP (last 3 results) No results for input(s): PROBNP in the last 8760 hours.  CBG: No results for input(s): GLUCAP in the last 168 hours.  Time coordinating discharge: Over 30 minutes

## 2014-07-18 ENCOUNTER — Ambulatory Visit (HOSPITAL_COMMUNITY)
Admission: RE | Admit: 2014-07-18 | Discharge: 2014-07-18 | Disposition: A | Discharge status: Home / Self Care | Payer: 59 | Source: Ambulatory Visit | Attending: Obstetrics and Gynecology | Admitting: Obstetrics and Gynecology

## 2014-07-18 ENCOUNTER — Other Ambulatory Visit (HOSPITAL_COMMUNITY): Payer: Self-pay | Admitting: Obstetrics and Gynecology

## 2014-07-18 DIAGNOSIS — Z1231 Encounter for screening mammogram for malignant neoplasm of breast: Secondary | ICD-10-CM

## 2014-07-23 ENCOUNTER — Other Ambulatory Visit: Payer: Self-pay | Admitting: Obstetrics and Gynecology

## 2014-07-23 DIAGNOSIS — N644 Mastodynia: Secondary | ICD-10-CM

## 2014-07-25 ENCOUNTER — Ambulatory Visit (INDEPENDENT_AMBULATORY_CARE_PROVIDER_SITE_OTHER): Payer: 59 | Admitting: Internal Medicine

## 2014-07-25 VITALS — BP 120/70 | HR 85 | Ht 64.0 in | Wt 241.0 lb

## 2014-07-25 DIAGNOSIS — E669 Obesity, unspecified: Secondary | ICD-10-CM | POA: Diagnosis not present

## 2014-07-25 DIAGNOSIS — K219 Gastro-esophageal reflux disease without esophagitis: Secondary | ICD-10-CM | POA: Diagnosis not present

## 2014-07-25 DIAGNOSIS — G478 Other sleep disorders: Secondary | ICD-10-CM | POA: Diagnosis not present

## 2014-07-25 DIAGNOSIS — G4730 Sleep apnea, unspecified: Secondary | ICD-10-CM

## 2014-07-25 DIAGNOSIS — G4736 Sleep related hypoventilation in conditions classified elsewhere: Secondary | ICD-10-CM | POA: Diagnosis not present

## 2014-07-25 NOTE — Progress Notes (Signed)
07/25/14- 48 yoF never smoker referred courtesy of Dr Manon Hilding for sleep medicine evaluation. Lives alone, working as an admissions associate at [employer]. Medical hx of gastric bypass bariatric sgy, obesity, GERD, HBP NPSG 08/19/07 WNL AHI 2.5/ hr, weight 201 lbs. Patient complains she never sleeps well even with trazodone 100 mg at bedtime and sometimes repeated in the middle of the night. Fragmented sleep, frequently waking up, often with headaches. Better with weight loss after bariatric surgery but has regained weight. Aware she snores. Occasionally wakes choking at night, relieved quickly by sitting up. Asthma as a child but not diagnosed as an adult. Cardiac evaluation previously for palpitations, resolved. Aware of GERD, much improved by Protonix twice a day. Tramadol has worked the best as a sleep aid to reduce waking at night. She had previously tried [place], Ambien CR.  Prior to Admission medications   Medication Sig Start Date End Date Taking? Authorizing Provider  AMLODIPINE BESYLATE-VALSARTAN PO Take 50 mg by mouth daily.   Yes Historical Provider, MD  CALCIUM PO Take 1 tablet by mouth every morning.    Yes Historical Provider, MD  Cyanocobalamin (VITAMIN B-12 PO) Take 1 tablet by mouth every morning.    Yes Historical Provider, MD  diazepam (VALIUM) 5 MG tablet Place 5 mg vaginally daily as needed for anxiety.   Yes Historical Provider, MD  diphenhydrAMINE (BENADRYL) 25 MG tablet Take 25 mg by mouth every 6 (six) hours as needed for itching or allergies (itching).    Yes Historical Provider, MD  gabapentin (NEURONTIN) 300 MG capsule Take 300 mg by mouth 2 (two) times daily. Take 1 tablet (300 mg) in the am and Take 2 tablets (600 mg) in the pm.   Yes Historical Provider, MD  HYDROcodone-acetaminophen (NORCO/VICODIN) 5-325 MG per tablet Take 1-2 tablets by mouth every 6 (six) hours as needed for moderate pain. 08/30/13  Yes Cathren Laine, MD  Multiple Vitamins-Minerals (MULTIVITAMIN  WITH MINERALS) tablet Take 1 tablet by mouth every morning.    Yes Historical Provider, MD  pantoprazole (PROTONIX) 40 MG tablet Take 40 mg by mouth 2 (two) times daily.   Yes Historical Provider, MD  tapentadol (NUCYNTA) 50 MG TABS tablet Take 100 mg by mouth every 12 (twelve) hours.    Yes Historical Provider, MD  tiZANidine (ZANAFLEX) 4 MG tablet Take 8 mg by mouth at bedtime.    Yes Historical Provider, MD  traZODone (DESYREL) 100 MG tablet Take 100 mg by mouth at bedtime.   Yes Historical Provider, MD   Past Medical History  Diagnosis Date  . Fatigue 2012  . Malaise 2012  . Insomnia   . Endometriosis   . Sleep apnea 2005  . Morbid obesity 2005  . Yeast infection   . Trichomonas   . Complication of anesthesia   . H/O varicella   . H/O mumps   . H/O bladder infections   . Insomnia secondary to situational depression 06/06/07  . Atrophic vaginitis 2009  . BV (bacterial vaginosis) 2006  . Liver mass, right lobe 08/13/04    Nodule  . Arthritis 12/10/03  . SUI (stress urinary incontinence, female) 08/12/03  . History of candidal vulvovaginitis 03/21/03  . Vulvar lesion 03/21/03  . Anemia   . Gallstones   . GERD (gastroesophageal reflux disease)   . HTN (hypertension)   . Allergy   . Osteoporosis   . S/P gastric bypass   . Chronic back pain   . Chronic LLQ pain   . Elevated  LFTs    Past Surgical History  Procedure Laterality Date  . Wisdom tooth extraction    . Appendectomy  2001  . Tonsillectomy    . Cholecystectomy  2011  . Abdominal hysterectomy  2001  . Gastric bypass  2007    [facility], Dr. Daphine DeutscherMartin  . Laparotomy    . Dilation and curettage of uterus    . Knee arthroscopy Left    Family History  Problem Relation Age of Onset  . Colitis Sister   . Diabetes Paternal Grandmother   . Leukemia Maternal Grandmother   . Arthritis Mother   . Prostate cancer Father    History   Social History  . Marital Status: Single    Spouse Name: N/A  . Number of  Children: 0  . Years of Education: N/A   Occupational History  . adnmission associate [employer]   Social History Main Topics  . Smoking status: Never Smoker   . Smokeless tobacco: Never Used  . Alcohol Use: No  . Drug Use: No  . Sexual Activity: Yes    Birth Control/ Protection: Surgical     Comment: hysterectomy    Other Topics Concern  . Not on file   Social History Narrative   ROS-see HPI   Negative unless "+" Constitutional:    weight loss, night sweats, fevers, chills, fatigue, lassitude. HEENT:    +headaches, difficulty swallowing, tooth/dental problems, sore throat,       +sneezing, +itching, ear ache, nasal congestion, post nasal drip, snoring CV:    chest pain, orthopnea, PND, swelling in lower extremities, anasarca,                                  dizziness, palpitations Resp:   +shortness of breath with exertion or at rest.                productive cough,   +non-productive cough, coughing up of blood.              change in color of mucus.  wheezing.   Skin:    rash or lesions. GI:  No-   heartburn, indigestion, +abdominal pain, nausea, vomiting, diarrhea,                 change in bowel habits, loss of appetite GU: dysuria, change in color of urine, no urgency or frequency.   flank pain. MS:  + joint pain, stiffness, decreased range of motion, back pain. Neuro-     nothing unusual Psych:  change in mood or affect.  depression or anxiety.   memory loss.  OBJ- Physical Exam General- Alert, Oriented, Affect-appropriate, Distress- none acute, + obese Skin- rash-none, lesions- none, excoriation- none Lymphadenopathy- none Head- atraumatic            Eyes- Gross vision intact, PERRLA, conjunctivae and secretions clear            Ears- Hearing, canals-normal            Nose- Clear, no-Septal dev, mucus, polyps, erosion, perforation             Throat- Mallampati III , mucosa clear , drainage- none, tonsils- atrophic Neck- flexible , trachea midline, no  stridor , thyroid nl, carotid no bruit Chest - symmetrical excursion , unlabored           Heart/CV- RRR , no murmur , no gallop  , no  rub, nl s1 s2                           - JVD- none , edema- none, stasis changes- none, varices- none           Lung- clear to P&A, wheeze- none, cough- none , dullness-none, rub- none           Chest wall-  Abd-  Br/ Gen/ Rectal- Not done, not indicated Extrem- cyanosis- none, clubbing, none, atrophy- none, strength- nl Neuro- grossly intact to observation

## 2014-07-25 NOTE — Patient Instructions (Addendum)
Order- DME- ONOX on room air      Dx hypoxemia during sleep  Suggest you elevate the head of your bed frame by putting one brick under each head leg as discussed. This will help prevent stomach juice from refluxing while you sleep.  Continue with the Protonix twice daily  Ok to continue with the trazodone  We will be looking at whether a sleep study would be helpful for you.

## 2014-07-26 ENCOUNTER — Encounter: Payer: Self-pay | Admitting: Internal Medicine

## 2014-07-26 ENCOUNTER — Other Ambulatory Visit: Payer: 59

## 2014-07-26 DIAGNOSIS — G4730 Sleep apnea, unspecified: Secondary | ICD-10-CM | POA: Insufficient documentation

## 2014-07-26 NOTE — Assessment & Plan Note (Signed)
She was not eager to repeat another sleep study but will agree if necessary. In preparing the report I realize she has gained 40 pounds since her previous sleep study. Plan-elevate head of bed with bricks under bed frame as instructed. This may reduce nocturnal reflux if that is contributing to her awakening. She can continue trazodone. Schedule overnight oximetry on room air.

## 2014-07-26 NOTE — Assessment & Plan Note (Signed)
She apparently has gained a significant amount of weight after her bariatric surgery. We discussed the impact of overweight on upper airway stability and sleep apnea

## 2014-07-26 NOTE — Assessment & Plan Note (Signed)
Symptomatically improved taking Protonix twice daily but she describes episodes of nocturnal choking which sound more like nocturnal reflux with laryngopharyngeal penetration, rather than apnea events. Without a witness it is hard to be sure and we may need to repeat her sleep study.

## 2014-07-31 ENCOUNTER — Ambulatory Visit
Admission: RE | Admit: 2014-07-31 | Discharge: 2014-07-31 | Disposition: A | Discharge status: Home / Self Care | Payer: 59 | Source: Ambulatory Visit | Attending: Obstetrics and Gynecology | Admitting: Obstetrics and Gynecology

## 2014-07-31 ENCOUNTER — Other Ambulatory Visit: Payer: Self-pay | Admitting: Obstetrics and Gynecology

## 2014-07-31 DIAGNOSIS — N644 Mastodynia: Secondary | ICD-10-CM

## 2014-08-02 ENCOUNTER — Encounter: Payer: Self-pay | Admitting: Internal Medicine

## 2014-08-15 ENCOUNTER — Telehealth: Payer: Self-pay | Admitting: Internal Medicine

## 2014-08-15 DIAGNOSIS — E669 Obesity, unspecified: Secondary | ICD-10-CM

## 2014-08-15 DIAGNOSIS — R064 Hyperventilation: Secondary | ICD-10-CM

## 2014-08-15 NOTE — Telephone Encounter (Signed)
Carly Delacruz, Have you seen this report?

## 2014-08-15 NOTE — Telephone Encounter (Signed)
Spoke with Selena Batten at [facility] as I have not seen ONO on patient-she is re-faxing the ONO to [facility] fax number. Will place on CY's cart to review once received.

## 2014-08-16 ENCOUNTER — Telehealth: Payer: Self-pay

## 2014-08-16 NOTE — Telephone Encounter (Signed)
Left message for patient to call back  

## 2014-08-16 NOTE — Telephone Encounter (Signed)
Her overnight oximetry documents over an hour with room air oxygen saturation less than or equal to 88% on room air.  This puts her in range where we consider oxygen to be medically necessary. I recommend we try this first. If she doesn't begin to feel better, we will repeat her sleep study. Plan- order DME [facility] home O2 2 L/M for sleep, dx obesity-hypoventilation

## 2014-08-16 NOTE — Telephone Encounter (Signed)
Results and phone note placed on CY's cart to review and advise on ONO.

## 2014-08-19 NOTE — Telephone Encounter (Signed)
LMTCB x 1 

## 2014-08-20 NOTE — Telephone Encounter (Signed)
lmtcb x2 for pt. 

## 2014-08-21 DIAGNOSIS — M6289 Other specified disorders of muscle: Secondary | ICD-10-CM | POA: Insufficient documentation

## 2014-08-21 NOTE — Telephone Encounter (Signed)
Spoke with patient, she will try Oxygen.   Order entered.  Notified [facility] to contact patient after 4:30pm or on Saturdays per pt's request. Nothing further needed.

## 2014-08-21 NOTE — Telephone Encounter (Signed)
lmtcb

## 2014-08-22 ENCOUNTER — Telehealth: Payer: Self-pay | Admitting: Internal Medicine

## 2014-08-22 NOTE — Telephone Encounter (Signed)
This has been faxed to fax # provided. Nothing further needed

## 2014-08-23 ENCOUNTER — Encounter: Payer: Self-pay | Admitting: Internal Medicine

## 2014-08-23 ENCOUNTER — Telehealth: Payer: Self-pay

## 2014-09-16 ENCOUNTER — Telehealth: Payer: Self-pay | Admitting: Internal Medicine

## 2014-09-16 NOTE — Telephone Encounter (Signed)
Noted  

## 2014-09-16 NOTE — Telephone Encounter (Addendum)
Patient was just calling in to let Dr. Maple HudsonYoung know that she is doing well on the oxygen.  Patient was just following up with our office because she was told to call us back to let us know how she is doing on oxygen.  Nothing further needed.  FYI to Dr. Maple HudsonYoung

## 2014-09-24 ENCOUNTER — Ambulatory Visit: Payer: 59

## 2014-10-14 ENCOUNTER — Telehealth: Payer: Self-pay | Admitting: Internal Medicine

## 2014-10-14 DIAGNOSIS — G4733 Obstructive sleep apnea (adult) (pediatric): Secondary | ICD-10-CM

## 2014-10-14 NOTE — Telephone Encounter (Signed)
Spoke with pt, states she will wake up while sleeping because she can't breathe- began happening a few weeks ago, not every night but frequently.  pt states it feels like she's having a panic attack; chest heaviness, gasping for air.  Pt states she has to stand up and walk around to catch her breath.  Pt wears 02 qhs since June.  Pt is concerned about this and is requesting CY's recs.  CY please advise.  Thanks!

## 2014-10-14 NOTE — Telephone Encounter (Signed)
Called made pt aware and order placed. Nothing further needed

## 2014-10-14 NOTE — Telephone Encounter (Signed)
Sounds as if it is time to go ahead with sleep study-              Schedule split protocol NPSG    Dx OSA

## 2014-10-25 ENCOUNTER — Ambulatory Visit: Payer: 59 | Admitting: Internal Medicine

## 2014-10-28 ENCOUNTER — Telehealth: Payer: Self-pay

## 2014-11-14 ENCOUNTER — Ambulatory Visit: Payer: 59 | Admitting: *Deleted

## 2014-12-11 ENCOUNTER — Telehealth: Payer: Self-pay

## 2014-12-26 ENCOUNTER — Encounter (HOSPITAL_BASED_OUTPATIENT_CLINIC_OR_DEPARTMENT_OTHER): Payer: 59

## 2015-01-30 ENCOUNTER — Telehealth: Payer: Self-pay | Admitting: Internal Medicine

## 2015-01-30 DIAGNOSIS — E662 Morbid (severe) obesity with alveolar hypoventilation: Secondary | ICD-10-CM

## 2015-01-30 NOTE — Telephone Encounter (Signed)
Called spoke with pt. She had to cancel appt this month since she is seeing a surgeon for her back and numbness in legs.  She reports she has not used her O2 at all and is wanting an order to be sent to pick this up. Please advise Dr. Maple HudsonYoung thanks

## 2015-01-31 NOTE — Telephone Encounter (Signed)
DME d/c O2 at patient request

## 2015-01-31 NOTE — Telephone Encounter (Signed)
Order has been placed. Pt is aware. Nothing further needed 

## 2015-02-13 ENCOUNTER — Ambulatory Visit: Payer: 59 | Admitting: Internal Medicine

## 2015-03-07 MED FILL — AMLODIPINE BESYLATE 5 MG TA: 5 | 90 days supply | Qty: 90 | Fill #2

## 2015-03-07 MED FILL — GABAPENTIN 300 MG CAPSULE: 300 | 30 days supply | Qty: 180 | Fill #3

## 2015-03-13 MED FILL — diazePAM 5 MG TABS: 5 | 16 days supply | Qty: 50 | Fill #1

## 2015-03-17 MED FILL — traZODone HCL 100 MG TABS: 100 | 30 days supply | Qty: 60 | Fill #3

## 2015-03-17 MED FILL — tiZANidine HCL 4 MG TABS: 4 | 30 days supply | Qty: 90 | Fill #1

## 2015-03-21 DIAGNOSIS — M461 Sacroiliitis, not elsewhere classified: Secondary | ICD-10-CM | POA: Diagnosis not present

## 2015-03-21 DIAGNOSIS — M47816 Spondylosis without myelopathy or radiculopathy, lumbar region: Secondary | ICD-10-CM | POA: Diagnosis not present

## 2015-03-21 DIAGNOSIS — G4700 Insomnia, unspecified: Secondary | ICD-10-CM | POA: Diagnosis not present

## 2015-03-21 DIAGNOSIS — G894 Chronic pain syndrome: Secondary | ICD-10-CM | POA: Diagnosis not present

## 2015-03-27 DIAGNOSIS — M545 Low back pain: Secondary | ICD-10-CM | POA: Diagnosis not present

## 2015-03-27 DIAGNOSIS — R300 Dysuria: Secondary | ICD-10-CM | POA: Diagnosis not present

## 2015-03-27 DIAGNOSIS — R413 Other amnesia: Secondary | ICD-10-CM | POA: Diagnosis not present

## 2015-03-31 DIAGNOSIS — H40013 Open angle with borderline findings, low risk, bilateral: Secondary | ICD-10-CM | POA: Diagnosis not present

## 2015-04-10 MED FILL — traZODone HCL 100 MG TABS: 100 | 30 days supply | Qty: 60 | Fill #0

## 2015-04-10 MED FILL — NUCYNTA 50 MG TABLET: 50 | 30 days supply | Qty: 90 | Fill #0

## 2015-04-10 MED FILL — tiZANidine HCL 4 MG TABS: 4 | 30 days supply | Qty: 90 | Fill #2

## 2015-04-10 MED FILL — BUPROPION HCL XL 300 MG TAB: 300 | 30 days supply | Qty: 30 | Fill #4

## 2015-04-18 DIAGNOSIS — G4700 Insomnia, unspecified: Secondary | ICD-10-CM | POA: Diagnosis not present

## 2015-04-18 DIAGNOSIS — G894 Chronic pain syndrome: Secondary | ICD-10-CM | POA: Diagnosis not present

## 2015-04-18 DIAGNOSIS — M47816 Spondylosis without myelopathy or radiculopathy, lumbar region: Secondary | ICD-10-CM | POA: Diagnosis not present

## 2015-04-18 DIAGNOSIS — M7061 Trochanteric bursitis, right hip: Secondary | ICD-10-CM | POA: Diagnosis not present

## 2015-04-18 DIAGNOSIS — M461 Sacroiliitis, not elsewhere classified: Secondary | ICD-10-CM | POA: Diagnosis not present

## 2015-04-28 MED FILL — diazePAM 5 MG TABS: 5 | 16 days supply | Qty: 50 | Fill #0

## 2015-04-28 MED FILL — GABAPENTIN 300 MG CAPSULE: 300 | 30 days supply | Qty: 150 | Fill #3

## 2015-05-05 DIAGNOSIS — M4807 Spinal stenosis, lumbosacral region: Secondary | ICD-10-CM | POA: Diagnosis not present

## 2015-05-05 MED FILL — traZODone HCL 100 MG TABS: 100 | 30 days supply | Qty: 60 | Fill #1

## 2015-05-05 MED FILL — tiZANidine HCL 4 MG TABS: 4 | 30 days supply | Qty: 90 | Fill #3

## 2015-05-07 DIAGNOSIS — Z09 Encounter for follow-up examination after completed treatment for conditions other than malignant neoplasm: Secondary | ICD-10-CM | POA: Diagnosis not present

## 2015-05-07 DIAGNOSIS — N9489 Other specified conditions associated with female genital organs and menstrual cycle: Secondary | ICD-10-CM | POA: Diagnosis not present

## 2015-05-12 MED FILL — NUCYNTA 50 MG TABLET: 50 | 30 days supply | Qty: 120 | Fill #0

## 2015-05-16 DIAGNOSIS — G894 Chronic pain syndrome: Secondary | ICD-10-CM | POA: Diagnosis not present

## 2015-05-16 DIAGNOSIS — Z79891 Long term (current) use of opiate analgesic: Secondary | ICD-10-CM | POA: Diagnosis not present

## 2015-05-16 DIAGNOSIS — M461 Sacroiliitis, not elsewhere classified: Secondary | ICD-10-CM | POA: Diagnosis not present

## 2015-05-16 DIAGNOSIS — G4700 Insomnia, unspecified: Secondary | ICD-10-CM | POA: Diagnosis not present

## 2015-05-16 DIAGNOSIS — M47816 Spondylosis without myelopathy or radiculopathy, lumbar region: Secondary | ICD-10-CM | POA: Diagnosis not present

## 2015-06-05 MED FILL — diazePAM 5 MG TABS: 5 | 16 days supply | Qty: 50 | Fill #1

## 2015-06-05 MED FILL — ONDANSETRON HCL 4 MG TABLET: 4 | 20 days supply | Qty: 20 | Fill #0

## 2015-06-05 MED FILL — traZODone HCL 100 MG TABS: 100 | 30 days supply | Qty: 60 | Fill #2

## 2015-06-05 MED FILL — tiZANidine HCL 4 MG TABS: 4 | 30 days supply | Qty: 90 | Fill #0

## 2015-06-13 MED FILL — NUCYNTA 50 MG TABLET: 50 | 30 days supply | Qty: 120 | Fill #0

## 2015-06-13 MED FILL — GABAPENTIN 300 MG CAPSULE: 300 | 30 days supply | Qty: 180 | Fill #0

## 2015-06-19 MED FILL — BUPROPION HCL XL 300 MG TAB: 300 | 30 days supply | Qty: 30 | Fill #5

## 2015-06-25 MED FILL — PANTOPRAZOLE SOD DR 40 MG T: 40 | 90 days supply | Qty: 180 | Fill #0

## 2015-07-02 MED FILL — traZODone HCL 100 MG TABS: 100 | 30 days supply | Qty: 60 | Fill #3

## 2015-07-02 MED FILL — tiZANidine HCL 4 MG TABS: 4 | 30 days supply | Qty: 90 | Fill #1

## 2015-07-16 DIAGNOSIS — Z09 Encounter for follow-up examination after completed treatment for conditions other than malignant neoplasm: Secondary | ICD-10-CM | POA: Diagnosis not present

## 2015-07-18 ENCOUNTER — Other Ambulatory Visit (HOSPITAL_COMMUNITY): Payer: Self-pay | Admitting: Geriatric Medicine

## 2015-07-18 DIAGNOSIS — R197 Diarrhea, unspecified: Secondary | ICD-10-CM | POA: Diagnosis not present

## 2015-07-18 DIAGNOSIS — R1032 Left lower quadrant pain: Secondary | ICD-10-CM | POA: Diagnosis not present

## 2015-07-18 DIAGNOSIS — G4700 Insomnia, unspecified: Secondary | ICD-10-CM | POA: Diagnosis not present

## 2015-07-18 DIAGNOSIS — G894 Chronic pain syndrome: Secondary | ICD-10-CM | POA: Diagnosis not present

## 2015-07-18 DIAGNOSIS — M47816 Spondylosis without myelopathy or radiculopathy, lumbar region: Secondary | ICD-10-CM | POA: Diagnosis not present

## 2015-07-18 DIAGNOSIS — M461 Sacroiliitis, not elsewhere classified: Secondary | ICD-10-CM | POA: Diagnosis not present

## 2015-07-22 ENCOUNTER — Ambulatory Visit (HOSPITAL_COMMUNITY): Payer: 59

## 2015-07-24 MED FILL — AMLODIPINE BESYLATE 5 MG TA: 5 | 90 days supply | Qty: 90 | Fill #0

## 2015-07-24 MED FILL — FOLIVANE-F CAPSULE: 125-1 | 30 days supply | Qty: 30 | Fill #2

## 2015-07-24 MED FILL — diazePAM 5 MG TABS: 5 | 16 days supply | Qty: 50 | Fill #0

## 2015-07-25 MED FILL — traZODone HCL 100 MG TABS: 100 | 30 days supply | Qty: 60 | Fill #0

## 2015-07-25 MED FILL — tiZANidine HCL 4 MG TABS: 4 | 30 days supply | Qty: 90 | Fill #2

## 2015-07-25 MED FILL — NUCYNTA 50 MG TABLET: 50 | 30 days supply | Qty: 120 | Fill #0

## 2015-08-04 ENCOUNTER — Ambulatory Visit (HOSPITAL_COMMUNITY): Payer: 59

## 2015-08-18 ENCOUNTER — Ambulatory Visit (HOSPITAL_COMMUNITY): Admission: RE | Admit: 2015-08-18 | Payer: 59 | Source: Ambulatory Visit

## 2015-08-18 MED FILL — GABAPENTIN 300 MG CAPSULE: 300 | 30 days supply | Qty: 180 | Fill #1

## 2015-08-18 MED FILL — tiZANidine HCL 4 MG TABS: 4 | 30 days supply | Qty: 90 | Fill #3

## 2015-08-18 MED FILL — traZODone HCL 100 MG TABS: 100 | 30 days supply | Qty: 60 | Fill #1

## 2015-08-20 ENCOUNTER — Ambulatory Visit (HOSPITAL_COMMUNITY)
Admission: RE | Admit: 2015-08-20 | Discharge: 2015-08-20 | Disposition: A | Discharge status: Home / Self Care | Payer: 59 | Source: Ambulatory Visit | Attending: Geriatric Medicine | Admitting: Geriatric Medicine

## 2015-08-20 DIAGNOSIS — K5641 Fecal impaction: Secondary | ICD-10-CM | POA: Diagnosis not present

## 2015-08-20 DIAGNOSIS — R1032 Left lower quadrant pain: Secondary | ICD-10-CM | POA: Insufficient documentation

## 2015-08-20 MED ORDER — IOHEXOL 300 MG/ML  SOLN: 100.0000 mL | Freq: Once | INTRAMUSCULAR | Status: DC | PRN

## 2015-08-20 MED ORDER — IOPAMIDOL (ISOVUE-300) INJECTION 61%
100.0000 mL | Freq: Once | INTRAVENOUS | Status: AC | PRN
  Administered 2015-08-20: 100 mL via INTRAVENOUS

## 2015-08-26 ENCOUNTER — Other Ambulatory Visit: Payer: Self-pay | Admitting: Nurse Practitioner

## 2015-08-26 ENCOUNTER — Ambulatory Visit
Admission: RE | Admit: 2015-08-26 | Discharge: 2015-08-26 | Disposition: A | Discharge status: Home / Self Care | Payer: 59 | Source: Ambulatory Visit | Attending: Nurse Practitioner | Admitting: Nurse Practitioner

## 2015-08-26 DIAGNOSIS — M79672 Pain in left foot: Secondary | ICD-10-CM

## 2015-08-26 DIAGNOSIS — M7061 Trochanteric bursitis, right hip: Secondary | ICD-10-CM | POA: Diagnosis not present

## 2015-08-26 DIAGNOSIS — W19XXXA Unspecified fall, initial encounter: Secondary | ICD-10-CM | POA: Diagnosis not present

## 2015-08-26 DIAGNOSIS — S92512A Displaced fracture of proximal phalanx of left lesser toe(s), initial encounter for closed fracture: Secondary | ICD-10-CM | POA: Diagnosis not present

## 2015-08-28 ENCOUNTER — Encounter: Payer: Self-pay | Admitting: Podiatry

## 2015-08-28 ENCOUNTER — Ambulatory Visit (INDEPENDENT_AMBULATORY_CARE_PROVIDER_SITE_OTHER): Payer: 59 | Admitting: Podiatry

## 2015-08-28 DIAGNOSIS — S92912A Unspecified fracture of left toe(s), initial encounter for closed fracture: Secondary | ICD-10-CM

## 2015-08-29 NOTE — Progress Notes (Signed)
Subjective:     Patient ID: Carly Delacruz, female   DOB: 05/11/1965, 50 y.o.   MRN: [id number]  HPI patient presents stating I fracture left fifth toe and I'm concerned about it and whether or not there is anything else we can do   Review of Systems  All other systems reviewed and are negative.      Objective:   Physical Exam  Constitutional: She is oriented to person, place, and time.  Cardiovascular: Intact distal pulses.   Musculoskeletal: Normal range of motion.  Neurological: She is oriented to person, place, and time.  Skin: Skin is warm.  Nursing note and vitals reviewed.  neurovascular status found to be intact muscle strength adequate with edema in the left fifth digit in the proximal portion moderate nature with no indications of other pathology the toe. Digital perfusion was normal patient's well oriented     Assessment:     Probable fracture left fifth toe    Plan:     Reviewed previous x-rays and recommended that she continue with wider shoes or open toed shoes but I do not see any other good treatments that would be of benefit to her convey to patient

## 2015-09-05 MED FILL — diazePAM 5 MG TABS: 5 | 16 days supply | Qty: 50 | Fill #1

## 2015-09-08 MED FILL — BUPROPION HCL XL 300 MG TAB: 300 | 30 days supply | Qty: 30 | Fill #0

## 2015-09-12 DIAGNOSIS — G894 Chronic pain syndrome: Secondary | ICD-10-CM | POA: Diagnosis not present

## 2015-09-12 DIAGNOSIS — Z79891 Long term (current) use of opiate analgesic: Secondary | ICD-10-CM | POA: Diagnosis not present

## 2015-09-12 DIAGNOSIS — G4700 Insomnia, unspecified: Secondary | ICD-10-CM | POA: Diagnosis not present

## 2015-09-12 DIAGNOSIS — M47816 Spondylosis without myelopathy or radiculopathy, lumbar region: Secondary | ICD-10-CM | POA: Diagnosis not present

## 2015-09-12 DIAGNOSIS — M461 Sacroiliitis, not elsewhere classified: Secondary | ICD-10-CM | POA: Diagnosis not present

## 2015-09-12 MED FILL — tiZANidine HCL 4 MG TABS: 4 | 30 days supply | Qty: 90 | Fill #0

## 2015-09-12 MED FILL — NUCYNTA 50 MG TABLET: 50 | 30 days supply | Qty: 60 | Fill #0

## 2015-09-12 MED FILL — traZODone HCL 100 MG TABS: 100 | 30 days supply | Qty: 60 | Fill #2

## 2015-09-12 MED FILL — GABAPENTIN 300 MG CAPSULE: 300 | 30 days supply | Qty: 180 | Fill #0

## 2015-10-08 MED FILL — tiZANidine HCL 4 MG TABS: 4 | 30 days supply | Qty: 90 | Fill #0

## 2015-10-10 MED FILL — traZODone HCL 100 MG TABS: 100 | 30 days supply | Qty: 60 | Fill #3

## 2015-10-10 MED FILL — NUCYNTA 50 MG TABLET: 50 | 30 days supply | Qty: 60 | Fill #0

## 2015-10-15 MED FILL — diazePAM 5 MG TABS: 5 | 16 days supply | Qty: 50 | Fill #0

## 2015-10-20 DIAGNOSIS — M7062 Trochanteric bursitis, left hip: Secondary | ICD-10-CM | POA: Diagnosis not present

## 2015-10-20 DIAGNOSIS — M7061 Trochanteric bursitis, right hip: Secondary | ICD-10-CM | POA: Diagnosis not present

## 2015-11-07 DIAGNOSIS — G894 Chronic pain syndrome: Secondary | ICD-10-CM | POA: Diagnosis not present

## 2015-11-07 DIAGNOSIS — M461 Sacroiliitis, not elsewhere classified: Secondary | ICD-10-CM | POA: Diagnosis not present

## 2015-11-07 DIAGNOSIS — G4700 Insomnia, unspecified: Secondary | ICD-10-CM | POA: Diagnosis not present

## 2015-11-07 DIAGNOSIS — M47816 Spondylosis without myelopathy or radiculopathy, lumbar region: Secondary | ICD-10-CM | POA: Diagnosis not present

## 2015-11-07 MED FILL — diazePAM 5 MG TABS: 5 | 16 days supply | Qty: 50 | Fill #1

## 2015-11-07 MED FILL — NUCYNTA 50 MG TABLET: 50 | 30 days supply | Qty: 120 | Fill #0

## 2015-11-07 MED FILL — GABAPENTIN 300 MG CAPSULE: 300 | 30 days supply | Qty: 180 | Fill #1

## 2015-11-07 MED FILL — traZODone HCL 100 MG TABS: 100 | 30 days supply | Qty: 60 | Fill #0

## 2015-11-07 MED FILL — tiZANidine HCL 4 MG TABS: 4 | 30 days supply | Qty: 90 | Fill #1

## 2015-12-04 MED FILL — traZODone HCL 100 MG TABS: 100 | 30 days supply | Qty: 60 | Fill #1

## 2015-12-04 MED FILL — GABAPENTIN 300 MG CAPSULE: 300 | 30 days supply | Qty: 180 | Fill #2

## 2015-12-04 MED FILL — tiZANidine HCL 4 MG TABS: 4 | 30 days supply | Qty: 90 | Fill #2

## 2015-12-11 DIAGNOSIS — M47816 Spondylosis without myelopathy or radiculopathy, lumbar region: Secondary | ICD-10-CM | POA: Diagnosis not present

## 2015-12-11 DIAGNOSIS — G4700 Insomnia, unspecified: Secondary | ICD-10-CM | POA: Diagnosis not present

## 2015-12-11 DIAGNOSIS — M461 Sacroiliitis, not elsewhere classified: Secondary | ICD-10-CM | POA: Diagnosis not present

## 2015-12-11 DIAGNOSIS — G894 Chronic pain syndrome: Secondary | ICD-10-CM | POA: Diagnosis not present

## 2015-12-11 DIAGNOSIS — M4807 Spinal stenosis, lumbosacral region: Secondary | ICD-10-CM | POA: Diagnosis not present

## 2015-12-18 MED FILL — NUCYNTA 50 MG TABLET: 50 | 30 days supply | Qty: 120 | Fill #0

## 2015-12-25 ENCOUNTER — Ambulatory Visit (HOSPITAL_COMMUNITY)
Admission: EM | Admit: 2015-12-25 | Discharge: 2015-12-25 | Disposition: A | Discharge status: Home / Self Care | Payer: 59 | Attending: Family Medicine | Admitting: Family Medicine

## 2015-12-25 ENCOUNTER — Encounter (HOSPITAL_COMMUNITY): Payer: Self-pay | Admitting: Emergency Medicine

## 2015-12-25 DIAGNOSIS — M62830 Muscle spasm of back: Secondary | ICD-10-CM

## 2015-12-25 MED ORDER — TRIAMCINOLONE ACETONIDE 40 MG/ML IJ SUSP
40.0000 mg | Freq: Once | INTRAMUSCULAR | Status: AC
  Administered 2015-12-25: 40 mg via INTRAMUSCULAR

## 2015-12-25 MED ORDER — CYCLOBENZAPRINE HCL 10 MG PO TABS: 10.0000 mg | ORAL_TABLET | Freq: Once | ORAL | Status: DC

## 2015-12-25 MED ORDER — TRIAMCINOLONE ACETONIDE 40 MG/ML IJ SUSP
INTRAMUSCULAR | Status: AC
  Filled 2015-12-25: qty 1

## 2015-12-25 MED ORDER — CYCLOBENZAPRINE HCL 10 MG PO TABS: 10.0000 mg | ORAL_TABLET | Freq: Two times a day (BID) | ORAL | 0 refills | Status: DC | PRN

## 2015-12-25 MED FILL — tiZANidine HCL 4 MG TABS: 4 | 30 days supply | Qty: 90 | Fill #3

## 2015-12-25 MED FILL — traZODone HCL 100 MG TABS: 100 | 30 days supply | Qty: 60 | Fill #2
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
## 2015-12-25 NOTE — ED Triage Notes (Signed)
Pt has a history of stenosis and sciatica pain.  She had an injection on October 12 and was doing well until she tripped last night.  Her pain starts in her neck, radiates down her right back into her buttocks to her foot all on the right side.

## 2015-12-25 NOTE — Discharge Instructions (Signed)
You were given an injection to relieve inflammation causing your pain.  - Follow up with your pain management provider for ongoing recommendations - Fill the prescription for muscle relaxer and take as directed as long as you do not need to be working or operating heavy machinery.  - Applying a heating pad will also help in addition to relative rest.  If you experience numbness or weakness on one side of your body, trouble speaking, facial droop, or change in mental status you should present for medical care promptly.

## 2016-01-08 MED FILL — GABAPENTIN 300 MG CAPSULE: 300 | 30 days supply | Qty: 240 | Fill #0

## 2016-01-08 MED FILL — BUPROPION HCL XL 300 MG TAB: 300 | 30 days supply | Qty: 30 | Fill #1

## 2016-01-13 DIAGNOSIS — R5081 Fever presenting with conditions classified elsewhere: Secondary | ICD-10-CM | POA: Diagnosis not present

## 2016-01-13 DIAGNOSIS — J028 Acute pharyngitis due to other specified organisms: Secondary | ICD-10-CM | POA: Diagnosis not present

## 2016-01-13 DIAGNOSIS — R1012 Left upper quadrant pain: Secondary | ICD-10-CM | POA: Diagnosis not present

## 2016-01-13 DIAGNOSIS — J00 Acute nasopharyngitis [common cold]: Secondary | ICD-10-CM | POA: Diagnosis not present

## 2016-01-13 MED FILL — predniSONE 10 MG (21) TBPK: 10 | 6 days supply | Qty: 21 | Fill #0

## 2016-01-13 MED FILL — HYDROCODONE-CHLORPHENIRAM S: 10-8 | 10 days supply | Qty: 100 | Fill #0

## 2016-01-13 MED FILL — AZITHROMYCIN 250 MG TABLET: 250 | 5 days supply | Qty: 6 | Fill #0

## 2016-01-14 DIAGNOSIS — N9489 Other specified conditions associated with female genital organs and menstrual cycle: Secondary | ICD-10-CM | POA: Diagnosis not present

## 2016-01-15 MED FILL — diazePAM 5 MG TABS: 5 | 16 days supply | Qty: 50 | Fill #0

## 2016-01-16 DIAGNOSIS — M47816 Spondylosis without myelopathy or radiculopathy, lumbar region: Secondary | ICD-10-CM | POA: Diagnosis not present

## 2016-01-16 DIAGNOSIS — G894 Chronic pain syndrome: Secondary | ICD-10-CM | POA: Diagnosis not present

## 2016-01-16 DIAGNOSIS — M461 Sacroiliitis, not elsewhere classified: Secondary | ICD-10-CM | POA: Diagnosis not present

## 2016-01-16 DIAGNOSIS — G4700 Insomnia, unspecified: Secondary | ICD-10-CM | POA: Diagnosis not present

## 2016-01-16 DIAGNOSIS — Z79891 Long term (current) use of opiate analgesic: Secondary | ICD-10-CM | POA: Diagnosis not present

## 2016-01-16 MED FILL — tiZANidine HCL 4 MG TABS: 4 | 30 days supply | Qty: 90 | Fill #1

## 2016-01-16 MED FILL — traZODone HCL 100 MG TABS: 100 | 30 days supply | Qty: 60 | Fill #3

## 2016-01-21 ENCOUNTER — Encounter: Payer: 59 | Attending: Internal Medicine | Admitting: Skilled Nursing Facility1

## 2016-01-21 DIAGNOSIS — Z713 Dietary counseling and surveillance: Secondary | ICD-10-CM | POA: Insufficient documentation

## 2016-01-27 ENCOUNTER — Encounter: Payer: Self-pay | Admitting: Skilled Nursing Facility1

## 2016-01-27 NOTE — Progress Notes (Signed)
Patient was seen on 01/21/2016 for the Weight Loss Class at the [facility]. The following learning objectives were met by the patient during this class:   Describe healthy choices in each food group  Describe portion size of foods  Use plate method for meal planning  Demonstrate how to read Nutrition Facts food label  Set realistic goals for weight loss, diet changes, and physical activity.   Goals:  1. Make healthy food choices in each food group.  2. Reduce portion size of foods.  3. Increase fruit and vegetable intake.  4. Use plate method for meal planning.  5. Increase physical activity.    Handouts given:   1. Nutrition Strategies for Weight Loss   2. Meal plan/portion card   3. MyPlate Planner   4. Weight Management Recipe Resources   5. Bake, Broil, Grill   

## 2016-02-04 DIAGNOSIS — N9489 Other specified conditions associated with female genital organs and menstrual cycle: Secondary | ICD-10-CM | POA: Diagnosis not present

## 2016-02-04 DIAGNOSIS — E669 Obesity, unspecified: Secondary | ICD-10-CM | POA: Diagnosis not present

## 2016-02-04 DIAGNOSIS — F329 Major depressive disorder, single episode, unspecified: Secondary | ICD-10-CM | POA: Diagnosis not present

## 2016-02-06 MED FILL — NUCYNTA 50 MG TABLET: 50 | 30 days supply | Qty: 120 | Fill #0

## 2016-02-06 MED FILL — tiZANidine HCL 4 MG TABS: 4 | 30 days supply | Qty: 90 | Fill #0

## 2016-02-06 MED FILL — traZODone HCL 100 MG TABS: 100 | 30 days supply | Qty: 60 | Fill #0

## 2016-02-17 ENCOUNTER — Ambulatory Visit (HOSPITAL_COMMUNITY)
Admission: EM | Admit: 2016-02-17 | Discharge: 2016-02-17 | Disposition: A | Discharge status: Home / Self Care | Payer: 59 | Attending: Emergency Medicine | Admitting: Emergency Medicine

## 2016-02-17 ENCOUNTER — Encounter (HOSPITAL_COMMUNITY): Payer: Self-pay | Admitting: Emergency Medicine

## 2016-02-17 ENCOUNTER — Ambulatory Visit (INDEPENDENT_AMBULATORY_CARE_PROVIDER_SITE_OTHER): Payer: 59

## 2016-02-17 DIAGNOSIS — M109 Gout, unspecified: Secondary | ICD-10-CM | POA: Diagnosis not present

## 2016-02-17 DIAGNOSIS — S82812A Torus fracture of upper end of left fibula, initial encounter for closed fracture: Secondary | ICD-10-CM | POA: Diagnosis not present

## 2016-02-17 DIAGNOSIS — W19XXXA Unspecified fall, initial encounter: Secondary | ICD-10-CM

## 2016-02-17 DIAGNOSIS — S99922A Unspecified injury of left foot, initial encounter: Secondary | ICD-10-CM | POA: Diagnosis not present

## 2016-02-17 DIAGNOSIS — M79672 Pain in left foot: Secondary | ICD-10-CM | POA: Diagnosis not present

## 2016-02-17 DIAGNOSIS — M25571 Pain in right ankle and joints of right foot: Secondary | ICD-10-CM

## 2016-02-17 DIAGNOSIS — Y92009 Unspecified place in unspecified non-institutional (private) residence as the place of occurrence of the external cause: Principal | ICD-10-CM

## 2016-02-17 DIAGNOSIS — M79605 Pain in left leg: Secondary | ICD-10-CM | POA: Diagnosis not present

## 2016-02-17 DIAGNOSIS — S8992XA Unspecified injury of left lower leg, initial encounter: Secondary | ICD-10-CM | POA: Diagnosis not present

## 2016-02-17 DIAGNOSIS — Y92099 Unspecified place in other non-institutional residence as the place of occurrence of the external cause: Secondary | ICD-10-CM | POA: Diagnosis not present

## 2016-02-17 MED ORDER — IPRATROPIUM-ALBUTEROL 0.5-2.5 (3) MG/3ML IN SOLN: 3.0000 mL | Freq: Once | RESPIRATORY_TRACT | Status: DC

## 2016-02-17 NOTE — Progress Notes (Signed)
Orthopedic Tech Progress Note Patient Details:  Carly Delacruz 02/27/1966 [id number]  Ortho Devices Type of Ortho Device: Knee Immobilizer Ortho Device/Splint Location: Provided and Applied Knee immobilizer to pts Left Leg.  Pt refused crutches/stated she has a cane to ambulate. Ortho Device/Splint Interventions: Application, Adjustment   Alvina ChouWilliams, Ashlee Bewley C 02/17/2016, 7:56 PM

## 2016-02-17 NOTE — ED Triage Notes (Signed)
[facility] a [facility] pain secondary to a fall that occurred today. The patient denied any LOC.

## 2016-02-17 NOTE — ED Provider Notes (Signed)
[facility]    CSN: [id number] Arrival date & time: 02/17/16  1626     History   Chief Complaint Chief Complaint  Patient presents with  . Fall    HPI Carly Delacruz is a 50 y.o. female.   The history is provided by the patient.  Fall  This is a new problem. The current episode started 1 to 2 hours ago (at home answering door and had syncopal episodebriefly with legs twisted and left calf and foot pain.). The problem has not changed since onset.Pertinent negatives include no chest pain, no abdominal pain, no headaches and no shortness of breath. The symptoms are aggravated by walking.    Past Medical History:  Diagnosis Date  . Allergy   . Anemia   . Arthritis 12/10/03  . Atrophic vaginitis 2009  . BV (bacterial vaginosis) 2006  . Chronic back pain   . Chronic LLQ pain   . Complication of anesthesia   . Elevated LFTs   . Endometriosis   . Fatigue 2012  . Gallstones   . GERD (gastroesophageal reflux disease)   . H/O bladder infections   . H/O mumps   . H/O varicella   . History of candidal vulvovaginitis 03/21/03  . HTN (hypertension)   . Insomnia   . Insomnia secondary to situational depression 06/06/07  . Liver mass, right lobe 08/13/04   Nodule  . Malaise 2012  . Morbid obesity (HCC) 2005  . Osteoporosis   . S/P gastric bypass   . Sleep apnea 2005  . SUI (stress urinary incontinence, female) 08/12/03  . Trichomonas   . Vulvar lesion 03/21/03  . Yeast infection     Patient Active Problem List   Diagnosis Date Noted  . Sleep-disordered breathing 07/26/2014  . GERD (gastroesophageal reflux disease) 06/15/2014  . Neuropathy (HCC) 06/15/2014  . Headache 06/15/2014  . Essential hypertension 06/15/2014  . Hyperkalemia 06/14/2014  . Dizziness 06/14/2014  . History of gastric bypass 05/08/2013  . Obesity 05/08/2013  . Back pain 04/26/2012    Past Surgical History:  Procedure Laterality Date  . ABDOMINAL HYSTERECTOMY  2001  . APPENDECTOMY   2001  . CHOLECYSTECTOMY  2011  . DILATION AND CURETTAGE OF UTERUS    . GASTRIC BYPASS  2007   [facility], Dr. Daphine DeutscherMartin  . KNEE ARTHROSCOPY Left   . LAPAROTOMY    . TONSILLECTOMY    . WISDOM TOOTH EXTRACTION      OB History    Gravida Para Term Preterm AB Living   0 0           SAB TAB Ectopic Multiple Live Births                   Home Medications    Prior to Admission medications   Medication Sig Start Date End Date Taking? Authorizing Provider  AMLODIPINE BESYLATE-VALSARTAN PO Take 50 mg by mouth daily.   Yes Historical Provider, MD  CALCIUM PO Take 1 tablet by mouth every morning.    Yes Historical Provider, MD  Cyanocobalamin (VITAMIN B-12 PO) Take 1 tablet by mouth every morning.    Yes Historical Provider, MD  cyclobenzaprine (FLEXERIL) 10 MG tablet Take 1 tablet (10 mg total) by mouth 2 (two) times daily as needed for muscle spasms. 12/25/15  Yes Tyrone Nineyan B Grunz, MD  diazepam (VALIUM) 5 MG tablet Place 5 mg vaginally daily as needed for anxiety.   Yes Historical Provider, MD  diphenhydrAMINE (BENADRYL) 25  MG tablet Take 25 mg by mouth every 6 (six) hours as needed for itching or allergies (itching).    Yes Historical Provider, MD  gabapentin (NEURONTIN) 300 MG capsule Take 300 mg by mouth 2 (two) times daily. Take 1 tablet (300 mg) in the am and Take 2 tablets (600 mg) in the pm.   Yes Historical Provider, MD  HYDROcodone-acetaminophen (NORCO/VICODIN) 5-325 MG per tablet Take 1-2 tablets by mouth every 6 (six) hours as needed for moderate pain. 08/30/13  Yes Cathren LaineKevin Steinl, MD  Multiple Vitamins-Minerals (MULTIVITAMIN WITH MINERALS) tablet Take 1 tablet by mouth every morning.    Yes Historical Provider, MD  pantoprazole (PROTONIX) 40 MG tablet Take 40 mg by mouth 2 (two) times daily.   Yes Historical Provider, MD  tapentadol (NUCYNTA) 50 MG TABS tablet Take 100 mg by mouth every 12 (twelve) hours.    Yes Historical Provider, MD  tiZANidine (ZANAFLEX) 4 MG tablet Take 8  mg by mouth at bedtime.    Yes Historical Provider, MD  traZODone (DESYREL) 100 MG tablet Take 100 mg by mouth at bedtime.   Yes Historical Provider, MD    Family History Family History  Problem Relation Age of Onset  . Diabetes Paternal Grandmother   . Leukemia Maternal Grandmother   . Arthritis Mother   . Prostate cancer Father   . Colitis Sister     Social History Social History  Substance Use Topics  . Smoking status: Never Smoker  . Smokeless tobacco: Never Used  . Alcohol use No     Allergies   Morphine and related; Septra [sulfamethoxazole-trimethoprim]; Sulfa antibiotics; Sulfamethoxazole-trimethoprim; Sulfasalazine; Ultram [tramadol]; Ciprofloxacin; Ciprofloxacin hcl; Darvocet [propoxyphene n-acetaminophen]; Dilaudid [hydromorphone hcl]; Adhesive [tape]; Codeine; Demerol [meperidine]; Hydromorphone; and Losartan   Review of Systems Review of Systems  Constitutional: Positive for activity change.  Respiratory: Negative for shortness of breath.   Cardiovascular: Negative for chest pain.  Gastrointestinal: Negative for abdominal pain.  Musculoskeletal: Positive for gait problem. Negative for back pain and neck pain.  Neurological: Negative for headaches.  All other systems reviewed and are negative.    Physical Exam Triage Vital Signs ED Triage Vitals  Enc Vitals Group     BP 02/17/16 1701 118/72     Pulse Rate 02/17/16 1701 113     Resp 02/17/16 1701 18     Temp 02/17/16 1701 99 F (37.2 C)     Temp Source 02/17/16 1701 Oral     SpO2 02/17/16 1701 98 %     Weight --      Height --      Head Circumference --      Peak Flow --      Pain Score 02/17/16 1650 7     Pain Loc --      Pain Edu? --      Excl. in GC? --    No data found.   Updated Vital Signs BP 118/72 (BP Location: Left Arm)   Pulse 113   Temp 99 F (37.2 C) (Oral)   Resp 18   SpO2 98%   Visual Acuity Right Eye Distance:   Left Eye Distance:   Bilateral Distance:    Right Eye  Near:   Left Eye Near:    Bilateral Near:     Physical Exam  Constitutional: She is oriented to person, place, and time. She appears well-developed and well-nourished. She appears distressed.  Cardiovascular: Normal rate, regular rhythm, normal heart sounds and intact distal pulses.  Pulmonary/Chest: Effort normal and breath sounds normal.  Musculoskeletal: She exhibits tenderness. She exhibits no deformity.       Left lower leg: She exhibits tenderness and bony tenderness. She exhibits no swelling, no edema and no deformity.       Left foot: There is decreased range of motion, tenderness and bony tenderness. There is no deformity.       Feet:  Neurological: She is alert and oriented to person, place, and time.  Skin: Skin is warm and dry.  Vitals reviewed.    UC Treatments / Results  Labs (all labs ordered are listed, but only abnormal results are displayed) Labs Reviewed - No data to display  EKG  EKG Interpretation None       Radiology No results found.  Procedures Procedures (including critical care time)  Medications Ordered in UC Medications  ipratropium-albuterol (DUONEB) 0.5-2.5 (3) MG/3ML nebulizer solution 3 mL (not administered)     Initial Impression / Assessment and Plan / UC Course  I have reviewed the triage vital signs and the nursing notes.  Pertinent labs & imaging results that were available during my care of the patient were reviewed by me and considered in my medical decision making (see chart for details).  Clinical Course as of Mar 06 1824  Tue Feb 17, 2016  1739 DG Tibia/Fibula Left [JK]    Clinical Course User Index [JK] Linna Hoff, MD      Final Clinical Impressions(s) / UC Diagnoses   Final diagnoses:  None    New Prescriptions New Prescriptions   No medications on file     Linna Hoff, MD 03/06/16 1827

## 2016-02-17 NOTE — Discharge Instructions (Signed)
Call in am for follow-up appt.

## 2016-02-19 ENCOUNTER — Encounter (INDEPENDENT_AMBULATORY_CARE_PROVIDER_SITE_OTHER): Payer: Self-pay

## 2016-02-19 ENCOUNTER — Ambulatory Visit (INDEPENDENT_AMBULATORY_CARE_PROVIDER_SITE_OTHER): Payer: Self-pay

## 2016-02-19 ENCOUNTER — Ambulatory Visit (INDEPENDENT_AMBULATORY_CARE_PROVIDER_SITE_OTHER): Payer: 59 | Admitting: Orthopaedic Surgery

## 2016-02-19 ENCOUNTER — Encounter (INDEPENDENT_AMBULATORY_CARE_PROVIDER_SITE_OTHER): Payer: Self-pay | Admitting: Orthopaedic Surgery

## 2016-02-19 DIAGNOSIS — M25572 Pain in left ankle and joints of left foot: Secondary | ICD-10-CM | POA: Diagnosis not present

## 2016-02-19 NOTE — Progress Notes (Signed)
Office Visit Note   Patient: Carly Delacruz           Date of Birth: 02/27/1966           MRN: [id number] Visit Date: 02/19/2016              Requested by: Tommy RainwaterIbethal Jaralla Shamleffer, MD [address]  [address] [place] [id number] PCP: Clelia SchaumannIbethal J Shamleffer, MD   Assessment & Plan: Visit Diagnoses:  1. Pain in left ankle and joints of left foot     Plan: Impression is severe left ankle sprain. Recommend Cam Walker. Out of work for 1 week. Ice and elevation Tylenol follow-up with me as needed.  Follow-Up Instructions: Return if symptoms worsen or fail to improve.   Orders:  Orders Placed This Encounter  Procedures  . XR Ankle Complete Left   No orders of the defined types were placed in this encounter.     Procedures: No procedures performed   Clinical Data: No additional findings.   Subjective: Chief Complaint  Patient presents with  . Left Leg - Pain, Injury    Patient comes in today for follow-up from urgent care. She had a fall at home in which the mechanism is unknown. She may have passed out. She was evaluated in urgent care and given follow-up for her left lower leg pain. She does have swelling and bruising of her left ankle. She is amnestic to the event. She denies any chest pain or shortness of breath. She endorses mainly left lower leg pain. Denies any constitutional symptoms.    Review of Systems Negative except for history of present illness  Objective: Vital Signs: There were no vitals taken for this visit.  Physical Exam Well-developed well-nourished no acute distress alert 3 nonlabored breathing normal judgments affect abdomen soft no lymphadenopathy Ortho Exam Exam of the left lower leg and ankle shows significant swelling and bruising of the left foot and ankle region she is severely tender to palpation of the left ankle. Proximal fibula is not really tender. No real swelling of the calf muscle. Specialty Comments:  No specialty  comments available.  Imaging: No results found.   PMFS History: Patient Active Problem List   Diagnosis Date Noted  . Sleep-disordered breathing 07/26/2014  . GERD (gastroesophageal reflux disease) 06/15/2014  . Neuropathy (HCC) 06/15/2014  . Headache 06/15/2014  . Essential hypertension 06/15/2014  . Hyperkalemia 06/14/2014  . Dizziness 06/14/2014  . History of gastric bypass 05/08/2013  . Obesity 05/08/2013  . Back pain 04/26/2012   Past Medical History:  Diagnosis Date  . Allergy   . Anemia   . Arthritis 12/10/03  . Atrophic vaginitis 2009  . BV (bacterial vaginosis) 2006  . Chronic back pain   . Chronic LLQ pain   . Complication of anesthesia   . Elevated LFTs   . Endometriosis   . Fatigue 2012  . Gallstones   . GERD (gastroesophageal reflux disease)   . H/O bladder infections   . H/O mumps   . H/O varicella   . History of candidal vulvovaginitis 03/21/03  . HTN (hypertension)   . Insomnia   . Insomnia secondary to situational depression 06/06/07  . Liver mass, right lobe 08/13/04   Nodule  . Malaise 2012  . Morbid obesity (HCC) 2005  . Osteoporosis   . S/P gastric bypass   . Sleep apnea 2005  . SUI (stress urinary incontinence, female) 08/12/03  . Trichomonas   . Vulvar lesion  03/21/03  . Yeast infection     Family History  Problem Relation Age of Onset  . Diabetes Paternal Grandmother   . Leukemia Maternal Grandmother   . Arthritis Mother   . Prostate cancer Father   . Colitis Sister     Past Surgical History:  Procedure Laterality Date  . ABDOMINAL HYSTERECTOMY  2001  . APPENDECTOMY  2001  . CHOLECYSTECTOMY  2011  . DILATION AND CURETTAGE OF UTERUS    . GASTRIC BYPASS  2007   [facility], Dr. Daphine DeutscherMartin  . KNEE ARTHROSCOPY Left   . LAPAROTOMY    . TONSILLECTOMY    . WISDOM TOOTH EXTRACTION     Social History   Occupational History  . 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 Smoking status: Never  Smoker  . Smokeless tobacco: Never Used  . Alcohol use No  . Drug use: No  . Sexual activity: Yes    Birth control/ protection: Surgical     Comment: hysterectomy

## 2016-02-20 MED FILL — GABAPENTIN 300 MG CAPSULE: 300 | 30 days supply | Qty: 240 | Fill #0

## 2016-02-20 MED FILL — diazePAM 5 MG TABS: 5 | 16 days supply | Qty: 50 | Fill #1

## 2016-02-26 ENCOUNTER — Telehealth (INDEPENDENT_AMBULATORY_CARE_PROVIDER_SITE_OTHER): Payer: Self-pay | Admitting: Orthopaedic Surgery

## 2016-02-26 NOTE — Telephone Encounter (Signed)
Pt requesting another oow note to give her one extra day. Pt wants to go back tues 03/02/16  Instead. Pt number is [phone]

## 2016-02-26 NOTE — Telephone Encounter (Signed)
yes

## 2016-02-26 NOTE — Telephone Encounter (Signed)
Please advise 

## 2016-02-27 ENCOUNTER — Telehealth (INDEPENDENT_AMBULATORY_CARE_PROVIDER_SITE_OTHER): Payer: Self-pay | Admitting: Orthopaedic Surgery

## 2016-02-27 NOTE — Telephone Encounter (Signed)
Please see other notes in chart. Spoke with patient she asked if she can have a note faxed to her employer extending her time to return to work on 03/02/16. The fax number is [phone]   Attn: Rams/Chris. The number to contact patient is [phone]

## 2016-02-27 NOTE — Telephone Encounter (Signed)
Note completed and faxed per patient instructions.

## 2016-02-27 NOTE — Telephone Encounter (Signed)
I faxed note per other message in chart. Patient did have questions about whether note took her out from date of injury until 03/02/16 or when she was seen here. I advised that per her request, I had just put on her note to continue out of work until 03/02/16 and to resume work on that day. She will call back if she needs something in more detail.

## 2016-03-02 NOTE — Telephone Encounter (Signed)
Patient is requesting a walking boot, she wears shoe size 7 1/2-8 wide. Says she told dr.xu in her last visit she had a boot but she recently found out it was not the correct boot.  Cb#: [phone]

## 2016-03-02 NOTE — Telephone Encounter (Signed)
yes

## 2016-03-02 NOTE — Telephone Encounter (Signed)
Ok to give her fx boot.

## 2016-03-03 MED FILL — traZODone HCL 100 MG TABS: 100 | 30 days supply | Qty: 60 | Fill #1

## 2016-03-03 MED FILL — tiZANidine HCL 4 MG TABS: 4 | 30 days supply | Qty: 90 | Fill #1

## 2016-03-03 MED FILL — NUCYNTA 50 MG TABLET: 50 | 12 days supply | Qty: 48 | Fill #0

## 2016-03-03 NOTE — Telephone Encounter (Signed)
Called pt to let her know okay to get new boot, she will be coming  In tomorrow.

## 2016-03-04 ENCOUNTER — Encounter (INDEPENDENT_AMBULATORY_CARE_PROVIDER_SITE_OTHER): Payer: Self-pay

## 2016-03-04 ENCOUNTER — Telehealth (INDEPENDENT_AMBULATORY_CARE_PROVIDER_SITE_OTHER): Payer: Self-pay

## 2016-03-04 ENCOUNTER — Other Ambulatory Visit (INDEPENDENT_AMBULATORY_CARE_PROVIDER_SITE_OTHER): Payer: Self-pay

## 2016-03-04 NOTE — Telephone Encounter (Signed)
Please advise 

## 2016-03-04 NOTE — Telephone Encounter (Signed)
yes

## 2016-03-04 NOTE — Telephone Encounter (Signed)
Patient would like to know if she can also be out of work tomorrow 03/05/16?Carly Delacruz. She has a follow up appt on Monday 03/09/15.

## 2016-03-04 NOTE — Telephone Encounter (Signed)
Patient called stating that left foot has been swelling, stated she went to work yesterday and was wondering if a lot of walking would cause left foot to swell?  Would like to know if she can get a work note due to not going to work because of her left foot swelling?

## 2016-03-04 NOTE — Telephone Encounter (Signed)
Called [facility] to return my call

## 2016-03-04 NOTE — Telephone Encounter (Signed)
Patient was seen in our office provided out of work note for today ok per Dr Roda ShuttersXu and also gave her a fx boot.

## 2016-03-05 ENCOUNTER — Encounter (INDEPENDENT_AMBULATORY_CARE_PROVIDER_SITE_OTHER): Payer: Self-pay

## 2016-03-05 NOTE — Telephone Encounter (Signed)
Note made,  forms faxed & patient is aware of this.

## 2016-03-08 ENCOUNTER — Ambulatory Visit (INDEPENDENT_AMBULATORY_CARE_PROVIDER_SITE_OTHER): Payer: 59 | Admitting: Orthopaedic Surgery

## 2016-03-08 ENCOUNTER — Encounter (INDEPENDENT_AMBULATORY_CARE_PROVIDER_SITE_OTHER): Payer: Self-pay | Admitting: Orthopaedic Surgery

## 2016-03-08 DIAGNOSIS — M25572 Pain in left ankle and joints of left foot: Secondary | ICD-10-CM | POA: Diagnosis not present

## 2016-03-08 NOTE — Progress Notes (Signed)
Office Visit Note   Patient: Carly Delacruz           Date of Birth: 11/02/1965           MRN: [id number] Visit Date: 03/08/2016              Requested by: Tommy RainwaterIbethal Jaralla Shamleffer, MD [address]  [address] [place], [place] [id number] PCP: Clelia SchaumannIbethal J Shamleffer, MD   Assessment & Plan: Visit Diagnoses:  1. Pain in left ankle and joints of left foot     Plan: Recommend physical therapy for proprioception and strengthening. Also need 6 weeks of desk duty. If not better should come back and we'll consider advanced imaging. Questions encouraged and answered.  Follow-Up Instructions: Return if symptoms worsen or fail to improve.   Orders:  Orders Placed This Encounter  Procedures  . Ambulatory referral to Physical Therapy   No orders of the defined types were placed in this encounter.     Procedures: No procedures performed   Clinical Data: No additional findings.   Subjective: Chief Complaint  Patient presents with  . Left Foot - Pain    Patient comes back today for continued left foot and ankle pain. She still has some pain. She's been walking with regular shoes. She is taking Tylenol for pain. She cannot take NSAIDs due to gastric bypass.    Review of Systems   Objective: Vital Signs: There were no vitals taken for this visit.  Physical Exam  Ortho Exam Exam of the left foot and ankle shows minimal swelling no bruising. She does have tenderness over the lateral and medial aspect of the ankle. This is nonspecific. Specialty Comments:  No specialty comments available.  Imaging: No results found.   PMFS History: Patient Active Problem List   Diagnosis Date Noted  . Pain in left ankle and joints of left foot 03/08/2016  . Sleep-disordered breathing 07/26/2014  . GERD (gastroesophageal reflux disease) 06/15/2014  . Neuropathy (HCC) 06/15/2014  . Headache 06/15/2014  . Essential hypertension 06/15/2014  . Hyperkalemia 06/14/2014  . Dizziness  06/14/2014  . History of gastric bypass 05/08/2013  . Obesity 05/08/2013  . Back pain 04/26/2012   Past Medical History:  Diagnosis Date  . Allergy   . Anemia   . Arthritis 12/10/03  . Atrophic vaginitis 2009  . BV (bacterial vaginosis) 2006  . Chronic back pain   . Chronic LLQ pain   . Complication of anesthesia   . Elevated LFTs   . Endometriosis   . Fatigue 2012  . Gallstones   . GERD (gastroesophageal reflux disease)   . H/O bladder infections   . H/O mumps   . H/O varicella   . History of candidal vulvovaginitis 03/21/03  . HTN (hypertension)   . Insomnia   . Insomnia secondary to situational depression 06/06/07  . Liver mass, right lobe 08/13/04   Nodule  . Malaise 2012  . Morbid obesity (HCC) 2005  . Osteoporosis   . S/P gastric bypass   . Sleep apnea 2005  . SUI (stress urinary incontinence, female) 08/12/03  . Trichomonas   . Vulvar lesion 03/21/03  . Yeast infection     Family History  Problem Relation Age of Onset  . Diabetes Paternal Grandmother   . Leukemia Maternal Grandmother   . Arthritis Mother   . Prostate cancer Father   . Colitis Sister     Past Surgical History:  Procedure Laterality Date  . ABDOMINAL HYSTERECTOMY  2001  . APPENDECTOMY  2001  . CHOLECYSTECTOMY  2011  . DILATION AND CURETTAGE OF UTERUS    . GASTRIC BYPASS  2007   [facility], Dr. Daphine Deutscher  . KNEE ARTHROSCOPY Left   . LAPAROTOMY    . TONSILLECTOMY    . WISDOM TOOTH EXTRACTION     Social History   Occupational History  . 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 Smoking status: Never Smoker  . Smokeless tobacco: Never Used  . Alcohol use No  . Drug use: No  . Sexual activity: Yes    Birth control/ protection: Surgical     Comment: hysterectomy

## 2016-03-15 DIAGNOSIS — M47816 Spondylosis without myelopathy or radiculopathy, lumbar region: Secondary | ICD-10-CM | POA: Diagnosis not present

## 2016-03-15 DIAGNOSIS — M461 Sacroiliitis, not elsewhere classified: Secondary | ICD-10-CM | POA: Diagnosis not present

## 2016-03-15 DIAGNOSIS — G894 Chronic pain syndrome: Secondary | ICD-10-CM | POA: Diagnosis not present

## 2016-03-15 DIAGNOSIS — G4700 Insomnia, unspecified: Secondary | ICD-10-CM | POA: Diagnosis not present

## 2016-03-15 MED FILL — NUCYNTA 50 MG TABLET: 50 | 30 days supply | Qty: 120 | Fill #0

## 2016-03-19 ENCOUNTER — Telehealth (INDEPENDENT_AMBULATORY_CARE_PROVIDER_SITE_OTHER): Payer: Self-pay | Admitting: Orthopaedic Surgery

## 2016-03-19 ENCOUNTER — Encounter (INDEPENDENT_AMBULATORY_CARE_PROVIDER_SITE_OTHER): Payer: Self-pay

## 2016-03-19 NOTE — Telephone Encounter (Signed)
Note made, faxed & mailed to her

## 2016-03-19 NOTE — Telephone Encounter (Signed)
Patient called advised she need a note for the 9th and the 10th  stating she was out  of work for her FMLA. The number to contact patient is [phone]

## 2016-03-29 MED FILL — tiZANidine HCL 4 MG TABS: 4 | 30 days supply | Qty: 90 | Fill #2

## 2016-03-29 MED FILL — GABAPENTIN 300 MG CAPSULE: 300 | 30 days supply | Qty: 240 | Fill #1

## 2016-03-29 MED FILL — traZODone HCL 100 MG TABS: 100 | 30 days supply | Qty: 60 | Fill #2

## 2016-04-05 ENCOUNTER — Encounter (INDEPENDENT_AMBULATORY_CARE_PROVIDER_SITE_OTHER): Payer: Self-pay | Admitting: *Deleted

## 2016-04-07 IMAGING — CT CT ANGIO HEAD
2 of 6 series · 8 of 30 positions shown · IV contrast (OMNIPAQUE 300)
Comparison: Head CT without contrast 2766 hours today.

CLINICAL DATA: 48-year-old male with dizziness, epistaxis, on blood
pressure medication, recent fall. Left side numbness and jaw pain.
Blurred vision, limb ataxia. Initial encounter.

EXAM:
CT ANGIOGRAPHY HEAD AND NECK
TECHNIQUE: Multidetector CT imaging of the head and neck was performed using
the standard protocol during bolus administration of intravenous
contrast. Multiplanar CT image reconstructions and MIPs were
obtained to evaluate the vascular anatomy. Carotid stenosis
measurements (when applicable) are obtained utilizing NASCET
criteria, using the distal internal carotid diameter as the
denominator.
CONTRAST:  100 mL Omnipaque 350

[Series 6: axial thin · axial · 0.39mm/px · z∈[+1329,+1549]mm · 6 of 308 slices shown]
[im 44/308  soft-tissue]
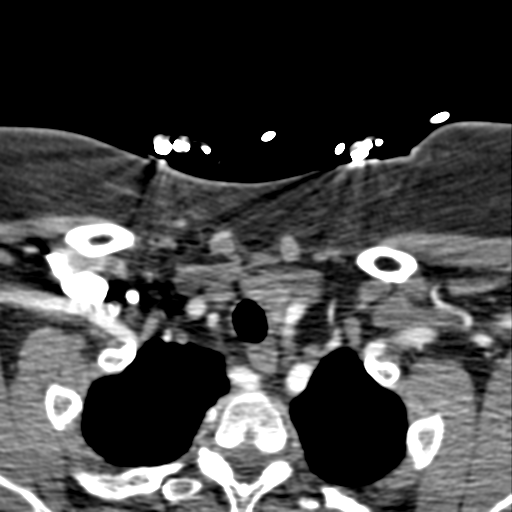
[im 88/308  bone]
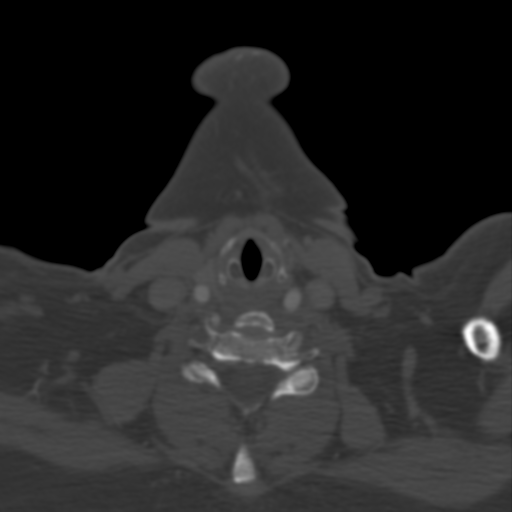
[im 132/308  soft-tissue]
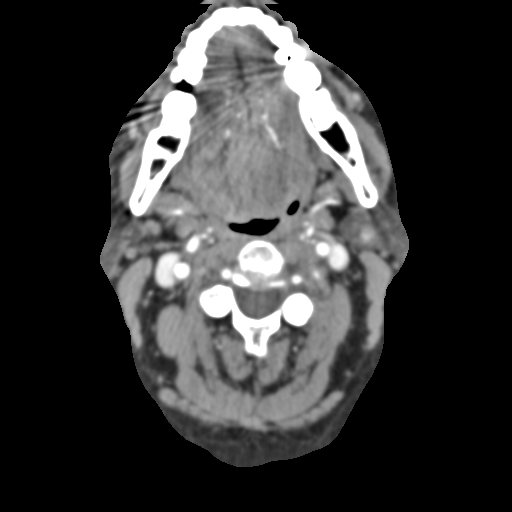
[im 176/308  bone]
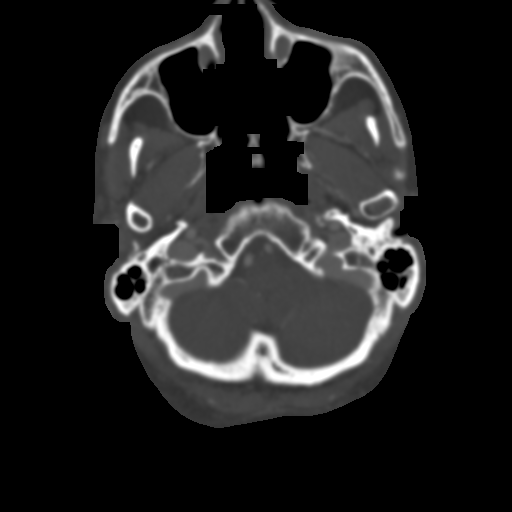
[im 220/308  soft-tissue]
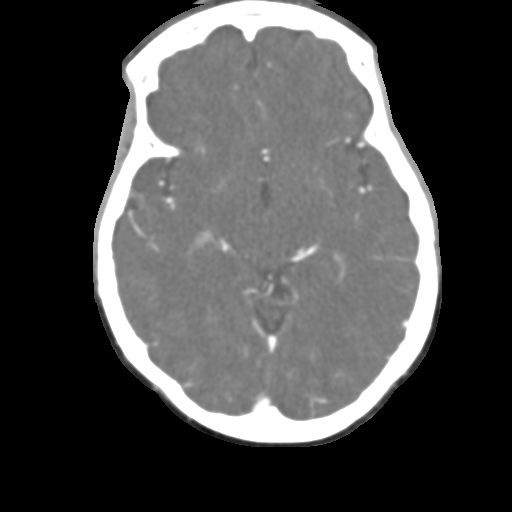
[im 264/308  bone]
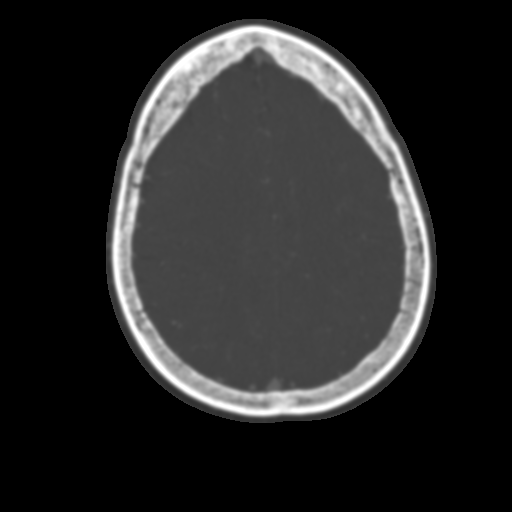

[Series 10: sagittal thin · sagittal · 0.44mm/px · 2 of 202 slices shown]
[im 2/202  soft-tissue]
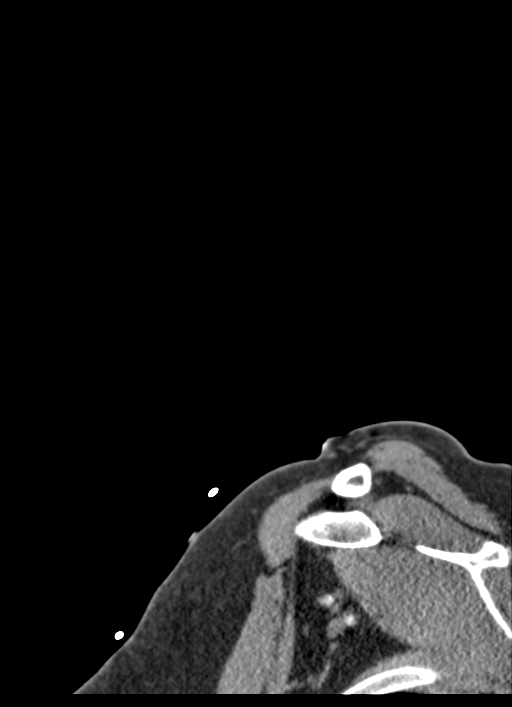
[im 201/202  soft-tissue]
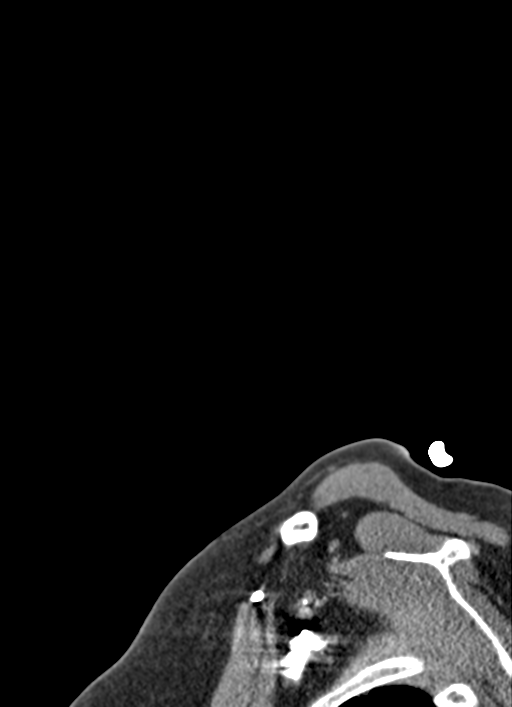

[8 of 30 positions shown; findings below may reference images not displayed]

FINDINGS: CTA NECK

Skeleton: No acute osseous abnormality identified. Visualized
paranasal sinuses and mastoids are clear.

Other neck: Negative lung apices. Up to 1 cm bilateral mildly
hypodense thyroid nodules are visible, these do not meet size
criteria for ultrasound follow-up. Larynx, pharynx, parapharyngeal
spaces, retropharyngeal space, sublingual space, submandibular
glands, and parotid glands are within normal limits. Visualized
orbit soft tissues are within normal limits. No cervical
lymphadenopathy.

Aortic arch: Aberrant origin of the right subclavian artery such
that a 4 vessel arch configuration is present. No arch
atherosclerosis.

Right carotid system: No right CCA origin stenosis. The right
vertebral artery arises from the right CCA as seen on series 5,
image 21, normal anatomic variant). Normal right carotid
bifurcation. Normal cervical right ICA.

Left carotid system: No left CCA stenosis. Normal left carotid
bifurcation. There is minimal to mild soft plaque and/or
irregularity of the distal cervical left ICA as seen on series 5,
image 81, but no significant stenosis. Otherwise negative cervical
left ICA.

Vertebral arteries:

The right vertebral artery arises from the right CCA in this patient
with an aberrant origin of the right subclavian artery.

The right vertebral artery then has a slightly late entry into the
cervical transverse foramen beginning at C5. It is negative to the
skullbase.

No proximal left subclavian artery stenosis. Normal left vertebral
artery origin. Tortuous proximal left vertebral artery, which is
otherwise normal to the skullbase.

CTA HEAD

Posterior circulation: Codominant distal vertebral arteries. Normal
vertebrobasilar junction. Small left PICA and dominant appearing
right AICA origins are patent. No basilar artery stenosis. SCA and
PCA origins are normal. Posterior communicating arteries are
diminutive or absent. Bilateral PCA branches are within normal
limits.

Anterior circulation:

Normal right ICA siphon. Normal left ICA siphon. Normal right
ophthalmic artery origin. Probable infundibulum at the rib left
ophthalmic artery origin (series 6, image 207). Normal carotid
termini, MCA and ACA origins.

Anterior communicating artery is diminutive or absent. Bilateral ACA
branches are within normal limits. Left MCA branches are within
normal limits. Right MCA branches are within normal limits.

Venous sinuses: Within normal limits.

Anatomic variants: Aberrant origin of the right subclavian artery.
Right vertebral artery origin off of the right CCA.

Delayed phase: No abnormal enhancement identified. Stable and normal
gray-white matter differentiation.
IMPRESSION: 1. Largely negative CTA head and neck, with no arterial stenosis,
occlusion, or dissection.
2. Minimal irregularity of the distal cervical left ICA. Probable
small infundibulum at the origin of the left ophthalmic artery.
3. Aberrant origins of both the right subclavian and right vertebral
arteries, normal anatomic variations.
4. Stable and negative CT appearance of the head.

## 2016-04-12 MED FILL — PANTOPRAZOLE SOD DR 40 MG T: 40 | 90 days supply | Qty: 180 | Fill #1

## 2016-04-12 MED FILL — diazePAM 5 MG TABS: 5 | 16 days supply | Qty: 50 | Fill #0

## 2016-04-15 MED FILL — AMLODIPINE BESYLATE 5 MG TA: 5 | 30 days supply | Qty: 30 | Fill #0

## 2016-04-19 MED FILL — NUCYNTA 50 MG TABLET: 50 | 30 days supply | Qty: 120 | Fill #0

## 2016-04-22 DIAGNOSIS — E785 Hyperlipidemia, unspecified: Secondary | ICD-10-CM | POA: Diagnosis not present

## 2016-04-22 DIAGNOSIS — M1990 Unspecified osteoarthritis, unspecified site: Secondary | ICD-10-CM | POA: Diagnosis not present

## 2016-04-22 DIAGNOSIS — R079 Chest pain, unspecified: Secondary | ICD-10-CM | POA: Diagnosis not present

## 2016-04-22 DIAGNOSIS — Z9884 Bariatric surgery status: Secondary | ICD-10-CM | POA: Diagnosis not present

## 2016-04-22 DIAGNOSIS — I10 Essential (primary) hypertension: Secondary | ICD-10-CM | POA: Diagnosis not present

## 2016-04-22 MED FILL — traZODone HCL 100 MG TABS: 100 | 30 days supply | Qty: 60 | Fill #3

## 2016-04-22 MED FILL — tiZANidine HCL 4 MG TABS: 4 | 30 days supply | Qty: 90 | Fill #3

## 2016-04-26 ENCOUNTER — Ambulatory Visit: Payer: 59 | Admitting: Physical Therapy

## 2016-04-28 ENCOUNTER — Other Ambulatory Visit: Payer: Self-pay

## 2016-04-28 NOTE — Progress Notes (Signed)
Cardiology Office Note    Date:  04/29/2016   ID:  Carly Delacruz, DOB [birth year], MRN [id number]  PCP:  Clelia Schaumann, MD  Cardiologist:  New- Dr. Mayford Knife   CC: syncope and CP  History of Present Illness:  Carly Delacruz is a 51 y.o. female with a history of morbid obesity s/p gastric bypass (2007), GERD, PUD, HTN, spinal stenosis, OSA but could not tolerate CPAP, endometriosis s/p total hysterectomy and no past cardiac history who presents to clinic for evaluation of syncope and chest pain.   12/19 she had a fall vs syncopal episode while walking to the door and fractured a bone in her LE. She had multiple syncopal episodes prior to this during the summer. She has a prodrome of dizziness like the room is closing in on her. No nausea and diaphoresis.   She had a virus with N/V, diarrhea late Jan. About a week after this she felt like her heart was going to beat out of her chest associated with chest pain that radiated to jaw and down her left arm. She tried to drink some Carafate which seemed to help a little bit. She called PCP office and they told her to go to ER but she refused. It lasted the whole day. She also has been feeling really fatigued with no energy. She does snore and has been told that she has OSA. She did have a CPAP but she sent it back because it didn't really help her and she didn't want pay for it. No LE edema, orthopnea or PND. No recent dizziness or syncope. She denies exertional chest pain but does get some chest tightness and SOB. She is physically very limited by her spinal stenosis and sciatica.   She does have a family history of heart disease. PGM has CVA and CAD. Mom has HTN. Father died of prostate cancer.    Past Medical History:  Diagnosis Date  . Allergy   . Anemia   . Arthritis 12/10/03  . Atrophic vaginitis 2009  . BV (bacterial vaginosis) 2006  . Chronic back pain   . Chronic LLQ pain   . Complication of anesthesia   . Elevated LFTs   .  Endometriosis   . Fatigue 2012  . Gallstones   . GERD (gastroesophageal reflux disease)   . H/O bladder infections   . H/O mumps   . H/O varicella   . History of candidal vulvovaginitis 03/21/03  . HTN (hypertension)   . Insomnia   . Insomnia secondary to situational depression 06/06/07  . Liver mass, right lobe 08/13/04   Nodule  . Malaise 2012  . Morbid obesity (HCC) 2005  . Osteoporosis   . S/P gastric bypass   . Sleep apnea 2005  . SUI (stress urinary incontinence, female) 08/12/03  . Trichomonas   . Vulvar lesion 03/21/03  . Yeast infection     Past Surgical History:  Procedure Laterality Date  . ABDOMINAL HYSTERECTOMY  2001  . APPENDECTOMY  2001  . CHOLECYSTECTOMY  2011  . DILATION AND CURETTAGE OF UTERUS    . GASTRIC BYPASS  2007   [facility], Dr. Daphine Deutscher  . KNEE ARTHROSCOPY Left   . LAPAROTOMY    . TONSILLECTOMY    . WISDOM TOOTH EXTRACTION      Current Medications: Outpatient Medications Prior to Visit  Medication Sig Dispense Refill  . AMLODIPINE BESYLATE-VALSARTAN PO Take 50 mg by mouth daily.    Marland Kitchen CALCIUM PO Take  1 tablet by mouth every morning.     . diazepam (VALIUM) 5 MG tablet Place 5 mg vaginally daily as needed for anxiety.    . diphenhydrAMINE (BENADRYL) 25 MG tablet Take 25 mg by mouth every 6 (six) hours as needed for itching or allergies (itching).     . gabapentin (NEURONTIN) 300 MG capsule Take 1 capsule (300 mg) three times a day and take 2 capsules at bedtime    . pantoprazole (PROTONIX) 40 MG tablet Take 40 mg by mouth 2 (two) times daily.    . tapentadol (NUCYNTA) 50 MG TABS tablet Take 100 mg by mouth every 12 (twelve) hours.     Marland Kitchen tiZANidine (ZANAFLEX) 4 MG tablet Take 8 mg by mouth at bedtime.     . traZODone (DESYREL) 100 MG tablet Take 200 mg by mouth at bedtime.     . Cyanocobalamin (VITAMIN B-12 PO) Take 1 tablet by mouth every morning.     . cyclobenzaprine (FLEXERIL) 10 MG tablet Take 1 tablet (10 mg total) by mouth 2  (two) times daily as needed for muscle spasms. (Patient not taking: Reported on 04/29/2016) 5 tablet 0  . HYDROcodone-acetaminophen (NORCO/VICODIN) 5-325 MG per tablet Take 1-2 tablets by mouth every 6 (six) hours as needed for moderate pain. (Patient not taking: Reported on 03/08/2016) 20 tablet 0  . Multiple Vitamins-Minerals (MULTIVITAMIN WITH MINERALS) tablet Take 1 tablet by mouth every morning.      No facility-administered medications prior to visit.      Allergies:   Morphine and related; Septra [sulfamethoxazole-trimethoprim]; Sulfa antibiotics; Sulfamethoxazole-trimethoprim; Sulfasalazine; Ultram [tramadol]; Ciprofloxacin; Ciprofloxacin hcl; Darvocet [propoxyphene n-acetaminophen]; Dilaudid [hydromorphone hcl]; Adhesive [tape]; Codeine; Demerol [meperidine]; Hydromorphone; and Losartan   Social History   Social History  . Marital status: Single    Spouse name: N/A  . Number of children: 0  . Years of education: N/A   Occupational History  . adnmission associate [employer]   Social History Main Topics  . Smoking status: Never Smoker  . Smokeless tobacco: Never Used  . Alcohol use No  . Drug use: No  . Sexual activity: Yes    Birth control/ protection: Surgical     Comment: hysterectomy    Other Topics Concern  . None   Social History Narrative  . None     Family History:  The patient's family history includes Arthritis in her mother; Colitis in her sister; Diabetes in her paternal grandmother; Leukemia in her maternal grandmother; Prostate cancer in her father.     ROS:   Please see the history of present illness.    ROS All other systems reviewed and are negative.   PHYSICAL EXAM:   VS:  BP 102/72   Pulse 88   Ht 5' 3.5" (1.613 m)   Wt 226 lb 12.8 oz (102.9 kg)   SpO2 95%   BMI 39.55 kg/m    GEN: Well nourished, well developed, in no acute distress , obese HEENT: normal  Neck: no JVD, carotid bruits, or masses Cardiac: RRR; no murmurs, rubs, or  gallops,no edema  Respiratory:  clear to auscultation bilaterally, normal work of breathing GI: soft, nontender, nondistended, + BS MS: no deformity or atrophy  Skin: warm and dry, no rash Neuro:  Alert and Oriented x 3, Strength and sensation are intact Psych: euthymic mood, full affect   Wt Readings from Last 3 Encounters:  04/29/16 226 lb 12.8 oz (102.9 kg)  07/25/14 241 lb (109.3 kg)  06/15/14 242 lb (  109.8 kg)      Studies/Labs Reviewed:   EKG:  EKG is ordered today.  The ekg ordered today demonstrates NSR, HR 88   Recent Labs: No results found for requested labs within last 8760 hours.   Lipid Panel No results found for: CHOL, TRIG, HDL, CHOLHDL, VLDL, LDLCALC, LDLDIRECT  Additional studies/ records that were reviewed today include:  none   ASSESSMENT & PLAN:   Syncope/palpitations: does not sound cardiac but with palpitations and RFs (obesity, OSA, HTN) for afib, will order a cardiac monitor and 2D ECHO to assess cardiac structure and function  Chest pain: atypical and got better with carafate but she does have cardiac RFs and some exertional chest tightness. Will get a lexiscan myoview  HTN: BP well controlled today  Fatigue: will order basic labs including CBC and TSH. Untreated OSA could be the underlying cause  OSA: intolerant to CPAP   Medication Adjustments/Labs and Tests Ordered: Current medicines are reviewed at length with the patient today.  Concerns regarding medicines are outlined above.  Medication changes, Labs and Tests ordered today are listed in the Patient Instructions below. Patient Instructions  Medication Instructions:  Your physician recommends that you continue on your current medications as directed. Please refer to the Current Medication list given to you today.   Labwork: TODAY: BMET, CBC, & TSH  Testing/Procedures: Your physician has requested that you have an echocardiogram. Echocardiography is a painless test that uses sound  waves to create images of your heart. It provides your doctor with information about the size and shape of your heart and how well your heart's chambers and valves are working. This procedure takes approximately one hour. There are no restrictions for this procedure.  Your physician has requested that you have a lexiscan myoview. For further information please visit [email or web address] Please follow instruction sheet, as given.  Your physician has recommended that you wear an event monitor. Event monitors are medical devices that record the heart's electrical activity. Doctors most often [place] these monitors to diagnose arrhythmias. Arrhythmias are problems with the speed or rhythm of the heartbeat. The monitor is a small, portable device. You can wear one while you do your normal daily activities. This is usually used to diagnose what is causing palpitations/syncope (passing out).    Follow-Up: Your physician recommends that you schedule a follow-up appointment in: AS NEEDED   Any Other Special Instructions Will Be Listed Below (If Applicable).  Echocardiogram An echocardiogram, or echocardiography, uses sound waves (ultrasound) to produce an image of your heart. The echocardiogram is simple, painless, obtained within a short period of time, and offers valuable information to your health care provider. The images from an echocardiogram can provide information such as:  Evidence of coronary artery disease (CAD).  Heart size.  Heart muscle function.  Heart valve function.  Aneurysm detection.  Evidence of a past heart attack.  Fluid buildup around the heart.  Heart muscle thickening.  Assess heart valve function. Tell a health care provider about:  Any allergies you have.  All medicines you are taking, including vitamins, herbs, eye drops, creams, and over-the-counter medicines.  Any problems you or family members have had with anesthetic medicines.  Any blood disorders you  have.  Any surgeries you have had.  Any medical conditions you have.  Whether you are pregnant or may be pregnant. What happens before the procedure? No special preparation is needed. Eat and drink normally. What happens during the procedure?  In order to produce an  image of your heart, gel will be applied to your chest and a wand-like tool (transducer) will be moved over your chest. The gel will help transmit the sound waves from the transducer. The sound waves will harmlessly bounce off your heart to allow the heart images to be captured in real-time motion. These images will then be recorded.  You may need an IV to receive a medicine that improves the quality of the pictures. What happens after the procedure? You may return to your normal schedule including diet, activities, and medicines, unless your health care provider tells you otherwise. This information is not intended to replace advice given to you by your health care provider. Make sure you discuss any questions you have with your health care provider. Document Released: 02/13/2000 Document Revised: 10/04/2015 Document Reviewed: 10/23/2012 Elsevier Interactive Patient Education  2017 Elsevier Inc.  Pharmacologic Stress Electrocardiogram Introduction A pharmacologic stress electrocardiogram is a heart (cardiac) test that uses nuclear imaging to evaluate the blood supply to your heart. This test may also be called a pharmacologic stress electrocardiography. Pharmacologic means that a medicine is used to increase your heart rate and blood pressure. This stress test is done to find areas of poor blood flow to the heart by determining the extent of coronary artery disease (CAD). Some people exercise on a treadmill, which naturally increases the blood flow to the heart. For those people unable to exercise on a treadmill, a medicine is used. This medicine stimulates your heart and will cause your heart to beat harder and more quickly, as  if you were exercising. Pharmacologic stress tests can help determine:  The adequacy of blood flow to your heart during increased levels of activity in order to clear you for discharge home.  The extent of coronary artery blockage caused by CAD.  Your prognosis if you have suffered a heart attack.  The effectiveness of cardiac procedures done, such as an angioplasty, which can increase the circulation in your coronary arteries.  Causes of chest pain or pressure. LET [facility] CARE PROVIDER KNOW ABOUT:  Any allergies you have.  All medicines you are taking, including vitamins, herbs, eye drops, creams, and over-the-counter medicines.  Previous problems you or members of your family have had with the use of anesthetics.  Any blood disorders you have.  Previous surgeries you have had.  Medical conditions you have.  Possibility of pregnancy, if this applies.  If you are currently breastfeeding. RISKS AND COMPLICATIONS Generally, this is a safe procedure. However, as with any procedure, complications can occur. Possible complications include:  You develop pain or pressure in the following areas:  Chest.  Jaw or neck.  Between your shoulder blades.  Radiating down your left arm.  Headache.  Dizziness or light-headedness.  Shortness of breath.  Increased or irregular heartbeat.  Low blood pressure.  Nausea or vomiting.  Flushing.  Redness going up the arm and slight pain during injection of medicine.  Heart attack (rare). BEFORE THE PROCEDURE  Avoid all forms of caffeine for 24 hours before your test or as directed by your health care provider. This includes coffee, tea (even decaffeinated tea), caffeinated sodas, chocolate, cocoa, and certain pain medicines.  Follow your health care provider's instructions regarding eating and drinking before the test.  Take your medicines as directed at regular times with water unless instructed otherwise. Exceptions may  include:  If you have diabetes, ask how you are to take your insulin or pills. It is common to adjust insulin  dosing the morning of the test.  If you are taking beta-blocker medicines, it is important to talk to your health care provider about these medicines well before the date of your test. Taking beta-blocker medicines may interfere with the test. In some cases, these medicines need to be changed or stopped 24 hours or more before the test.  If you wear a nitroglycerin patch, it may need to be removed prior to the test. Ask your health care provider if the patch should be removed before the test.  If you use an inhaler for any breathing condition, bring it with you to the test.  If you are an outpatient, bring a snack so you can eat right after the stress phase of the test.  Do not smoke for 4 hours prior to the test or as directed by your health care provider.  Do not apply lotions, powders, creams, or oils on your chest prior to the test.  Wear comfortable shoes and clothing. Let your health care provider know if you were unable to complete or follow the preparations for your test. PROCEDURE  Multiple patches (electrodes) will be put on your chest. If needed, small areas of your chest may be shaved to get better contact with the electrodes. Once the electrodes are attached to your body, multiple wires will be attached to the electrodes, and your heart rate will be monitored.  An IV access will be started. A nuclear trace (isotope) is given. The isotope may be given intravenously, or it may be swallowed. Nuclear refers to several types of radioactive isotopes, and the nuclear isotope lights up the arteries so that the nuclear images are clear. The isotope is absorbed by your body. This results in low radiation exposure.  A resting nuclear image is taken to show how your heart functions at rest.  A medicine is given through the IV access.  A second scan is done about 1 hour after the  medicine injection and determines how your heart functions under stress.  During this stress phase, you will be connected to an electrocardiogram machine. Your blood pressure and oxygen levels will be monitored. What to expect after the procedure  Your heart rate and blood pressure will be monitored after the test.  You may return to your normal schedule, including diet,activities, and medicines, unless your health care provider tells you otherwise. This information is not intended to replace advice given to you by your health care provider. Make sure you discuss any questions you have with your health care provider. Document Released: 07/04/2008 Document Revised: 07/24/2015 Document Reviewed: 08/25/2015 Elsevier Interactive Patient Education  2017 Elsevier Inc.   Cardiac Event Monitoring A cardiac event monitor is a small recording device that is used to detect abnormal heart rhythms (arrhythmias). The monitor is used to record your heart rhythm when you have symptoms, such as:  Fast heartbeats (palpitations), such as heart racing or fluttering.  Dizziness.  Fainting or light-headedness.  Unexplained weakness. Some monitors are wired to electrodes placed on your chest. Electrodes are flat, sticky disks that attach to your skin. Other monitors may be hand-held or worn on the wrist. The monitor can be worn for up to 30 days. If the monitor is attached to your chest, a technician will prepare your chest for the electrode placement and show you how to work the monitor. Take time to practice using the monitor before you leave the office. Make sure you understand how to send the information from the monitor to  your health care provider. In some cases, you may need to use a landline telephone instead of a cell phone. What are the risks? Generally, this device is safe to use, but it possible that the skin under the electrodes will become irritated. How to use your cardiac event monitor  Wear  your monitor at all times, except when you are in water:  Do not let the monitor get wet.  Take the monitor off when you bathe. Do not swim or use a hot tub with it on.  Keep your skin clean. Do not put body lotion or moisturizer on your chest.  Change the electrodes as told by your health care provider or any time they stop sticking to your skin. You may need to use medical tape to keep them on.  Try to put the electrodes in slightly different places on your chest to help prevent skin irritation. They must remain in the area under your left breast and in the upper right section of your chest.  Make sure the monitor is safely clipped to your clothing or in a location close to your body that your health care provider recommends.  Press the button to record as soon as you feel heart-related symptoms, such as:  Dizziness.  Weakness.  Light-headedness.  Palpitations.  Thumping or pounding in your chest.  Shortness of breath.  Unexplained weakness.  Keep a diary of your activities, such as walking, doing chores, and taking medicine. It is very important to note what you were doing when you pushed the button to record your symptoms. This will help your health care provider determine what might be contributing to your symptoms.  Send the recorded information as recommended by your health care provider. It may take some time for your health care provider to process the results.  Change the batteries as told by your health care provider.  Keep electronic devices away from your monitor. This includes:  Tablets.  MP3 players.  Cell phones.  While wearing your monitor you should avoid:  Electric blankets.  Firefighter.  Electric toothbrushes.  Microwave ovens.  Magnets.  Metal detectors. Get help right away if:  You have chest pain.  You have extreme difficulty breathing or shortness of breath.  You develop a very fast heartbeat that persists.  You develop  dizziness that does not go away.  You faint or constantly feel like you are about to faint. Summary  A cardiac event monitor is a small recording device that is used to help detect abnormal heart rhythms (arrhythmias).  The monitor is used to record your heart rhythm when you have heart-related symptoms.  Make sure you understand how to send the information from the monitor to your health care provider.  It is important to press the button on the monitor when you have any heart-related symptoms.  Keep a diary of your activities, such as walking, doing chores, and taking medicine. It is very important to note what you were doing when you pushed the button to record your symptoms. This will help your health care provider learn what might be causing your symptoms. This information is not intended to replace advice given to you by your health care provider. Make sure you discuss any questions you have with your health care provider. Document Released: 11/25/2007 Document Revised: 01/31/2016 Document Reviewed: 01/31/2016 Elsevier Interactive Patient Education  2017 [employer].   If you need a refill on your cardiac medications before your next appointment, please call your pharmacy.  Signed, Cline CrockKathryn Cebert Dettmann, PA-C  04/29/2016 12:55 PM    [facility] [address], [place], [place]  [address] Phone: [phone]; Fax: [phone]

## 2016-04-28 NOTE — Patient Outreach (Addendum)
Link to Wellness Screening: Patient rated poor on health screening on risk assessment Oct 2017.  Follow up phone call.   Placed call to patient who states she has been out of work for 2 week for back pain and foot pain.  Reports that she had an EKG.  Patient states that her EKG was abnormal and that she has a follow up planned with cardiology tomorrow.    Patient reports that she is on FMLA and is working with [employer].  Patient reports that she see a new primary MD" Dr. Seth BakeV"  Deboraha Sprang( [facility])  Patient reports that [facility] and is having problems paying for medications. Reports that she borrowed money from someone to get her recent medications.   Reviewed depression score and reminded patient about EAP. Patient reports that she is no longer on depression medications.  Next follow up with primary MD is 05/10/2016 at 3:15  PLAN: Reports difficulty with medication cost [facility]. Positive [facility] score [facility].  Patient denies nursing needs as of today.   Reviewed employee assistance program.  Encouraged patient to call Human resources or [employer] regarding FMLA questions.  Findings reported to assistant director, Livia SnellenGeronda Pulliam who will facilitate coordination of care for patient.   Rowe PavyAmanda Ezrael Sam, RN, BSN, CEN. [facility] Community Care Coordinator [phone]

## 2016-04-29 ENCOUNTER — Ambulatory Visit (INDEPENDENT_AMBULATORY_CARE_PROVIDER_SITE_OTHER): Payer: 59 | Admitting: Physician Assistant

## 2016-04-29 ENCOUNTER — Encounter: Payer: Self-pay | Admitting: Physician Assistant

## 2016-04-29 VITALS — BP 102/72 | HR 88 | Ht 63.5 in | Wt 226.8 lb

## 2016-04-29 DIAGNOSIS — R002 Palpitations: Secondary | ICD-10-CM | POA: Diagnosis not present

## 2016-04-29 DIAGNOSIS — R5383 Other fatigue: Secondary | ICD-10-CM

## 2016-04-29 DIAGNOSIS — I209 Angina pectoris, unspecified: Secondary | ICD-10-CM | POA: Diagnosis not present

## 2016-04-29 DIAGNOSIS — I10 Essential (primary) hypertension: Secondary | ICD-10-CM | POA: Diagnosis not present

## 2016-04-29 DIAGNOSIS — R079 Chest pain, unspecified: Secondary | ICD-10-CM

## 2016-04-29 DIAGNOSIS — G4733 Obstructive sleep apnea (adult) (pediatric): Secondary | ICD-10-CM | POA: Diagnosis not present

## 2016-04-29 NOTE — Patient Instructions (Addendum)
Medication Instructions:  Your physician recommends that you continue on your current medications as directed. Please refer to the Current Medication list given to you today.   Labwork: TODAY: BMET, CBC, & TSH  Testing/Procedures: Your physician has requested that you have an echocardiogram. Echocardiography is a painless test that uses sound waves to create images of your heart. It provides your doctor with information about the size and shape of your heart and how well your heart's chambers and valves are working. This procedure takes approximately one hour. There are no restrictions for this procedure.  Your physician has requested that you have a lexiscan myoview. For further information please visit [email or web address]. Please follow instruction sheet, as given.  Your physician has recommended that you wear an event monitor. Event monitors are medical devices that record the heart's electrical activity. Doctors most often us these monitors to diagnose arrhythmias. Arrhythmias are problems with the speed or rhythm of the heartbeat. The monitor is a small, portable device. You can wear one while you do your normal daily activities. This is usually used to diagnose what is causing palpitations/syncope (passing out).    Follow-Up: Your physician recommends that you schedule a follow-up appointment in: AS NEEDED   Any Other Special Instructions Will Be Listed Below (If Applicable).  Echocardiogram An echocardiogram, or echocardiography, uses sound waves (ultrasound) to produce an image of your heart. The echocardiogram is simple, painless, obtained within a short period of time, and offers valuable information to your health care provider. The images from an echocardiogram can provide information such as:  Evidence of coronary artery disease (CAD).  Heart size.  Heart muscle function.  Heart valve function.  Aneurysm detection.  Evidence of a past heart attack.  Fluid buildup  around the heart.  Heart muscle thickening.  Assess heart valve function. Tell a health care provider about:  Any allergies you have.  All medicines you are taking, including vitamins, herbs, eye drops, creams, and over-the-counter medicines.  Any problems you or family members have had with anesthetic medicines.  Any blood disorders you have.  Any surgeries you have had.  Any medical conditions you have.  Whether you are pregnant or may be pregnant. What happens before the procedure? No special preparation is needed. Eat and drink normally. What happens during the procedure?  In order to produce an image of your heart, gel will be applied to your chest and a wand-like tool (transducer) will be moved over your chest. The gel will help transmit the sound waves from the transducer. The sound waves will harmlessly bounce off your heart to allow the heart images to be captured in real-time motion. These images will then be recorded.  You may need an IV to receive a medicine that improves the quality of the pictures. What happens after the procedure? You may return to your normal schedule including diet, activities, and medicines, unless your health care provider tells you otherwise. This information is not intended to replace advice given to you by your health care provider. Make sure you discuss any questions you have with your health care provider. Document Released: 02/13/2000 Document Revised: 10/04/2015 Document Reviewed: 10/23/2012 Elsevier Interactive Patient Education  2017 Elsevier Inc.  Pharmacologic Stress Electrocardiogram Introduction A pharmacologic stress electrocardiogram is a heart (cardiac) test that uses nuclear imaging to evaluate the blood supply to your heart. This test may also be called a pharmacologic stress electrocardiography. Pharmacologic means that a medicine is used to increase your heart rate and blood pressure.  This stress test is done to find areas  of poor blood flow to the heart by determining the extent of coronary artery disease (CAD). Some people exercise on a treadmill, which naturally increases the blood flow to the heart. For those people unable to exercise on a treadmill, a medicine is used. This medicine stimulates your heart and will cause your heart to beat harder and more quickly, as if you were exercising. Pharmacologic stress tests can help determine:  The adequacy of blood flow to your heart during increased levels of activity in order to clear you for discharge home.  The extent of coronary artery blockage caused by CAD.  Your prognosis if you have suffered a heart attack.  The effectiveness of cardiac procedures done, such as an angioplasty, which can increase the circulation in your coronary arteries.  Causes of chest pain or pressure. LET [facility] CARE PROVIDER KNOW ABOUT:  Any allergies you have.  All medicines you are taking, including vitamins, herbs, eye drops, creams, and over-the-counter medicines.  Previous problems you or members of your family have had with the use of anesthetics.  Any blood disorders you have.  Previous surgeries you have had.  Medical conditions you have.  Possibility of pregnancy, if this applies.  If you are currently breastfeeding. RISKS AND COMPLICATIONS Generally, this is a safe procedure. However, as with any procedure, complications can occur. Possible complications include:  You develop pain or pressure in the following areas:  Chest.  Jaw or neck.  Between your shoulder blades.  Radiating down your left arm.  Headache.  Dizziness or light-headedness.  Shortness of breath.  Increased or irregular heartbeat.  Low blood pressure.  Nausea or vomiting.  Flushing.  Redness going up the arm and slight pain during injection of medicine.  Heart attack (rare). BEFORE THE PROCEDURE  Avoid all forms of caffeine for 24 hours before your test or as  directed by your health care provider. This includes coffee, tea (even decaffeinated tea), caffeinated sodas, chocolate, cocoa, and certain pain medicines.  Follow your health care provider's instructions regarding eating and drinking before the test.  Take your medicines as directed at regular times with water unless instructed otherwise. Exceptions may include:  If you have diabetes, ask how you are to take your insulin or pills. It is common to adjust insulin dosing the morning of the test.  If you are taking beta-blocker medicines, it is important to talk to your health care provider about these medicines well before the date of your test. Taking beta-blocker medicines may interfere with the test. In some cases, these medicines need to be changed or stopped 24 hours or more before the test.  If you wear a nitroglycerin patch, it may need to be removed prior to the test. Ask your health care provider if the patch should be removed before the test.  If you use an inhaler for any breathing condition, bring it with you to the test.  If you are an outpatient, bring a snack so you can eat right after the stress phase of the test.  Do not smoke for 4 hours prior to the test or as directed by your health care provider.  Do not apply lotions, powders, creams, or oils on your chest prior to the test.  Wear comfortable shoes and clothing. Let your health care provider know if you were unable to complete or follow the preparations for your test. PROCEDURE  Multiple patches (electrodes) will be put on your  chest. If needed, small areas of your chest may be shaved to get better contact with the electrodes. Once the electrodes are attached to your body, multiple wires will be attached to the electrodes, and your heart rate will be monitored.  An IV access will be started. A nuclear trace (isotope) is given. The isotope may be given intravenously, or it may be swallowed. Nuclear refers to several types  of radioactive isotopes, and the nuclear isotope lights up the arteries so that the nuclear images are clear. The isotope is absorbed by your body. This results in low radiation exposure.  A resting nuclear image is taken to show how your heart functions at rest.  A medicine is given through the IV access.  A second scan is done about 1 hour after the medicine injection and determines how your heart functions under stress.  During this stress phase, you will be connected to an electrocardiogram machine. Your blood pressure and oxygen levels will be monitored. What to expect after the procedure  Your heart rate and blood pressure will be monitored after the test.  You may return to your normal schedule, including diet,activities, and medicines, unless your health care provider tells you otherwise. This information is not intended to replace advice given to you by your health care provider. Make sure you discuss any questions you have with your health care provider. Document Released: 07/04/2008 Document Revised: 07/24/2015 Document Reviewed: 08/25/2015 Elsevier Interactive Patient Education  2017 Elsevier Inc.   Cardiac Event Monitoring A cardiac event monitor is a small recording device that is used to detect abnormal heart rhythms (arrhythmias). The monitor is used to record your heart rhythm when you have symptoms, such as:  Fast heartbeats (palpitations), such as heart racing or fluttering.  Dizziness.  Fainting or light-headedness.  Unexplained weakness. Some monitors are wired to electrodes placed on your chest. Electrodes are flat, sticky disks that attach to your skin. Other monitors may be hand-held or worn on the wrist. The monitor can be worn for up to 30 days. If the monitor is attached to your chest, a technician will prepare your chest for the electrode placement and show you how to work the monitor. Take time to practice using the monitor before you leave the office.  Make sure you understand how to send the information from the monitor to your health care provider. In some cases, you may need to use a landline telephone instead of a cell phone. What are the risks? Generally, this device is safe to use, but it possible that the skin under the electrodes will become irritated. How to use your cardiac event monitor  Wear your monitor at all times, except when you are in water:  Do not let the monitor get wet.  Take the monitor off when you bathe. Do not swim or use a hot tub with it on.  Keep your skin clean. Do not put body lotion or moisturizer on your chest.  Change the electrodes as told by your health care provider or any time they stop sticking to your skin. You may need to use medical tape to keep them on.  Try to put the electrodes in slightly different places on your chest to help prevent skin irritation. They must remain in the area under your left breast and in the upper right section of your chest.  Make sure the monitor is safely clipped to your clothing or in a location close to your body that your health care provider  recommends.  Press the button to record as soon as you feel heart-related symptoms, such as:  Dizziness.  Weakness.  Light-headedness.  Palpitations.  Thumping or pounding in your chest.  Shortness of breath.  Unexplained weakness.  Keep a diary of your activities, such as walking, doing chores, and taking medicine. It is very important to note what you were doing when you pushed the button to record your symptoms. This will help your health care provider determine what might be contributing to your symptoms.  Send the recorded information as recommended by your health care provider. It may take some time for your health care provider to process the results.  Change the batteries as told by your health care provider.  Keep electronic devices away from your monitor. This includes:  Tablets.  MP3  players.  Cell phones.  While wearing your monitor you should avoid:  Electric blankets.  Firefighter.  Electric toothbrushes.  Microwave ovens.  Magnets.  Metal detectors. Get help right away if:  You have chest pain.  You have extreme difficulty breathing or shortness of breath.  You develop a very fast heartbeat that persists.  You develop dizziness that does not go away.  You faint or constantly feel like you are about to faint. Summary  A cardiac event monitor is a small recording device that is used to help detect abnormal heart rhythms (arrhythmias).  The monitor is used to record your heart rhythm when you have heart-related symptoms.  Make sure you understand how to send the information from the monitor to your health care provider.  It is important to press the button on the monitor when you have any heart-related symptoms.  Keep a diary of your activities, such as walking, doing chores, and taking medicine. It is very important to note what you were doing when you pushed the button to record your symptoms. This will help your health care provider learn what might be causing your symptoms. This information is not intended to replace advice given to you by your health care provider. Make sure you discuss any questions you have with your health care provider. Document Released: 11/25/2007 Document Revised: 01/31/2016 Document Reviewed: 01/31/2016 Elsevier Interactive Patient Education  2017 [employer].   If you need a refill on your cardiac medications before your next appointment, please call your pharmacy.

## 2016-04-30 LAB — CBC
HEMATOCRIT: 41.7 % (ref 34.0–46.6)
Hemoglobin: 13.4 g/dL (ref 11.1–15.9)
MCH: 28 pg (ref 26.6–33.0)
MCHC: 32.1 g/dL (ref 31.5–35.7)
MCV: 87 fL (ref 79–97)
Platelets: 308 10*3/uL (ref 150–379)
RBC: 4.78 x10E6/uL (ref 3.77–5.28)
RDW: 13.9 % (ref 12.3–15.4)
WBC: 5.1 10*3/uL (ref 3.4–10.8)

## 2016-04-30 LAB — BASIC METABOLIC PANEL
BUN/Creatinine Ratio: 14 (ref 9–23)
BUN: 14 mg/dL (ref 6–24)
CO2: 23 mmol/L (ref 18–29)
CREATININE: 0.97 mg/dL (ref 0.57–1.00)
Calcium: 9.2 mg/dL (ref 8.7–10.2)
Chloride: 100 mmol/L (ref 96–106)
GFR calc Af Amer: 78 mL/min/{1.73_m2} (ref >59)
GFR calc non Af Amer: 67 mL/min/{1.73_m2} (ref >59)
GLUCOSE: 85 mg/dL (ref 65–99)
Potassium: 4.3 mmol/L (ref 3.5–5.2)
SODIUM: 144 mmol/L (ref 134–144)

## 2016-04-30 LAB — TSH: TSH: 1.31 u[IU]/mL (ref 0.450–4.500)

## 2016-05-03 ENCOUNTER — Other Ambulatory Visit: Payer: Self-pay | Admitting: *Deleted

## 2016-05-03 NOTE — Patient Outreach (Signed)
[facility] Care Management  05/03/2016  [name] 06/22/1965 [id number]  CSW made an initial attempt to try and contact patient today to perform phone assessment, as well as assess and assist with social needs and services, without success.  A HIPAA complaint message was left for patient on voicemail.  CSW is currently awaiting a return call.  CSW will make a second outreach attempt in one week, if CSW does not receive a return call from patient in the meantime. Danford BadJoanna Saporito, BSW, MSW, LCSW  Licensed Restaurant manager, fast foodClinical Social Worker  [facility] Care Management [facility]  [address] [place], [place], [place] [address] Physical Address-[address], [place], [place] [address] Toll Free Main # [phone] Fax # [phone] Cell # [phone]  Office # [phone] [email or web address]

## 2016-05-04 ENCOUNTER — Ambulatory Visit: Payer: 59 | Admitting: Physical Therapy

## 2016-05-04 ENCOUNTER — Telehealth (HOSPITAL_COMMUNITY): Payer: Self-pay | Admitting: *Deleted

## 2016-05-04 NOTE — Telephone Encounter (Signed)
Left message on voicemail in reference to upcoming appointment scheduled for 05/06/16. Phone number given for a call back so details instructions can be given. Kerrigan Glendening Jacqueline   

## 2016-05-05 ENCOUNTER — Telehealth: Payer: Self-pay | Admitting: Physician Assistant

## 2016-05-05 DIAGNOSIS — M47817 Spondylosis without myelopathy or radiculopathy, lumbosacral region: Secondary | ICD-10-CM | POA: Diagnosis not present

## 2016-05-05 NOTE — Telephone Encounter (Signed)
New Message     Pt called said Carly JewsKatie Thompson told her if her blood work was normal she did not have to have halter monitor or stress test done, pt was calling to get results. Her phone has been disconnected per pt , she will call back at 11am to speak with someone thank you .

## 2016-05-05 NOTE — Telephone Encounter (Signed)
Pt called back.  She wants to cancel all her testing until further notice.  All test cancelled and pt will call back to r/s.

## 2016-05-06 ENCOUNTER — Encounter (HOSPITAL_COMMUNITY): Payer: 59

## 2016-05-06 DIAGNOSIS — H40053 Ocular hypertension, bilateral: Secondary | ICD-10-CM | POA: Diagnosis not present

## 2016-05-06 DIAGNOSIS — Z83511 Family history of glaucoma: Secondary | ICD-10-CM | POA: Diagnosis not present

## 2016-05-06 DIAGNOSIS — H40013 Open angle with borderline findings, low risk, bilateral: Secondary | ICD-10-CM | POA: Diagnosis not present

## 2016-05-08 ENCOUNTER — Emergency Department (HOSPITAL_COMMUNITY): Payer: 59

## 2016-05-08 ENCOUNTER — Encounter (HOSPITAL_COMMUNITY): Payer: Self-pay | Admitting: Emergency Medicine

## 2016-05-08 ENCOUNTER — Emergency Department (HOSPITAL_COMMUNITY)
Admission: EM | Admit: 2016-05-08 | Discharge: 2016-05-08 | Disposition: A | Discharge status: Home / Self Care | Payer: 59 | Attending: Emergency Medicine | Admitting: Emergency Medicine

## 2016-05-08 DIAGNOSIS — I10 Essential (primary) hypertension: Secondary | ICD-10-CM | POA: Insufficient documentation

## 2016-05-08 DIAGNOSIS — Z79899 Other long term (current) drug therapy: Secondary | ICD-10-CM | POA: Insufficient documentation

## 2016-05-08 DIAGNOSIS — M549 Dorsalgia, unspecified: Secondary | ICD-10-CM | POA: Diagnosis not present

## 2016-05-08 DIAGNOSIS — R0789 Other chest pain: Secondary | ICD-10-CM | POA: Diagnosis not present

## 2016-05-08 DIAGNOSIS — R079 Chest pain, unspecified: Secondary | ICD-10-CM | POA: Diagnosis not present

## 2016-05-08 LAB — BASIC METABOLIC PANEL
Anion gap: 13 (ref 5–15)
BUN: 14 mg/dL (ref 6–20)
CALCIUM: 9.3 mg/dL (ref 8.9–10.3)
CO2: 19 mmol/L — ABNORMAL LOW (ref 22–32)
Chloride: 105 mmol/L (ref 101–111)
Creatinine, Ser: 0.79 mg/dL (ref 0.44–1.00)
GFR calc Af Amer: >60 mL/min (ref >60)
GFR calc non Af Amer: >60 mL/min (ref >60)
GLUCOSE: 114 mg/dL — AB (ref 65–99)
POTASSIUM: 3.3 mmol/L — AB (ref 3.5–5.1)
Sodium: 137 mmol/L (ref 135–145)

## 2016-05-08 LAB — CBC
HEMATOCRIT: 40.5 % (ref 36.0–46.0)
Hemoglobin: 12.5 g/dL (ref 12.0–15.0)
MCH: 26.3 pg (ref 26.0–34.0)
MCHC: 30.9 g/dL (ref 30.0–36.0)
MCV: 85.1 fL (ref 78.0–100.0)
Platelets: 258 10*3/uL (ref 150–400)
RBC: 4.76 MIL/uL (ref 3.87–5.11)
RDW: 13.5 % (ref 11.5–15.5)
WBC: 10.3 10*3/uL (ref 4.0–10.5)

## 2016-05-08 LAB — TROPONIN I: Troponin I: <0.03 ng/mL (ref <0.03)

## 2016-05-08 MED ORDER — IOPAMIDOL (ISOVUE-370) INJECTION 76%
100.0000 mL | Freq: Once | INTRAVENOUS | Status: AC | PRN
Start: 2016-05-08 — End: 2016-05-08
  Administered 2016-05-08: 100 mL via INTRAVENOUS

## 2016-05-08 MED ORDER — IOPAMIDOL (ISOVUE-370) INJECTION 76%
INTRAVENOUS | Status: AC
  Filled 2016-05-08: qty 100

## 2016-05-08 MED ORDER — FAMOTIDINE IN NACL 20-0.9 MG/50ML-% IV SOLN
20.0000 mg | Freq: Once | INTRAVENOUS | Status: AC
  Administered 2016-05-08: 20 mg via INTRAVENOUS
  Filled 2016-05-08: qty 50

## 2016-05-08 MED ORDER — LORAZEPAM 2 MG/ML IJ SOLN
0.5000 mg | Freq: Once | INTRAMUSCULAR | Status: AC
  Administered 2016-05-08: 0.5 mg via INTRAVENOUS
  Filled 2016-05-08: qty 1

## 2016-05-08 MED ORDER — GI COCKTAIL ~~LOC~~
30.0000 mL | Freq: Once | ORAL | Status: AC
  Administered 2016-05-08: 30 mL via ORAL
  Filled 2016-05-08: qty 30

## 2016-05-08 MED ORDER — SODIUM CHLORIDE 0.9 % IV BOLUS (SEPSIS)
1000.0000 mL | Freq: Once | INTRAVENOUS | Status: AC
  Administered 2016-05-08: 1000 mL via INTRAVENOUS

## 2016-05-08 MED ORDER — FAMOTIDINE 20 MG PO TABS: 20.0000 mg | ORAL_TABLET | Freq: Two times a day (BID) | ORAL | 0 refills | Status: DC

## 2016-05-08 MED ORDER — FENTANYL CITRATE (PF) 100 MCG/2ML IJ SOLN
50.0000 ug | Freq: Once | INTRAMUSCULAR | Status: AC
  Administered 2016-05-08: 50 ug via INTRAVENOUS
  Filled 2016-05-08: qty 2

## 2016-05-08 NOTE — ED Provider Notes (Signed)
[facility]-EMERGENCY DEPT Provider Note   CSN: [id number] Arrival date & time: 05/08/16  0556     History   Chief Complaint Chief Complaint  Patient presents with  . Chest Pain    HPI Carly Delacruz is a 51 y.o. female.  HPI Patient presents with concern of chest and back pain. She notes that over the past few days, possibly one week she has been unable to achieve relief from the new pain in her thorax, anterior, posterior. Pain is sore, severe, otherwise nonradiating.  No trauma, no recent changes in diet, activity, medicine. She does have history of hypertension, denies history of aortic disease. She denies weakness in her extremities. Patient has tried multiple OTC medication, with no relief.    Past Medical History:  Diagnosis Date  . Allergy   . Anemia   . Arthritis 12/10/03  . Atrophic vaginitis 2009  . BV (bacterial vaginosis) 2006  . Chronic back pain   . Chronic LLQ pain   . Complication of anesthesia   . Elevated LFTs   . Endometriosis   . Fatigue 2012  . Gallstones   . GERD (gastroesophageal reflux disease)   . H/O bladder infections   . H/O mumps   . H/O varicella   . History of candidal vulvovaginitis 03/21/03  . HTN (hypertension)   . Insomnia   . Insomnia secondary to situational depression 06/06/07  . Liver mass, right lobe 08/13/04   Nodule  . Malaise 2012  . Morbid obesity (HCC) 2005  . Osteoporosis   . S/P gastric bypass   . Sleep apnea 2005  . SUI (stress urinary incontinence, female) 08/12/03  . Trichomonas   . Vulvar lesion 03/21/03  . Yeast infection     Patient Active Problem List   Diagnosis Date Noted  . Pain in left ankle and joints of left foot 03/08/2016  . Sleep-disordered breathing 07/26/2014  . GERD (gastroesophageal reflux disease) 06/15/2014  . Neuropathy (HCC) 06/15/2014  . Headache 06/15/2014  . Essential hypertension 06/15/2014  . Hyperkalemia 06/14/2014  . Dizziness 06/14/2014  . History of gastric bypass  05/08/2013  . Obesity 05/08/2013  . Back pain 04/26/2012    Past Surgical History:  Procedure Laterality Date  . ABDOMINAL HYSTERECTOMY  2001  . APPENDECTOMY  2001  . CHOLECYSTECTOMY  2011  . DILATION AND CURETTAGE OF UTERUS    . GASTRIC BYPASS  2007   [facility], Dr. Daphine Deutscher  . KNEE ARTHROSCOPY Left   . LAPAROTOMY    . TONSILLECTOMY    . WISDOM TOOTH EXTRACTION      OB History    Gravida Para Term Preterm AB Living   0 0           SAB TAB Ectopic Multiple Live Births                   Home Medications    Prior to Admission medications   Medication Sig Start Date End Date Taking? Authorizing Provider  AMLODIPINE BESYLATE-VALSARTAN PO Take by mouth daily.    Yes Historical Provider, MD  buPROPion (WELLBUTRIN XL) 300 MG 24 hr tablet Take 300 mg by mouth daily.   Yes Historical Provider, MD  CALCIUM PO Take 1 tablet by mouth every morning.    Yes Historical Provider, MD  diazepam (VALIUM) 5 MG tablet Place 5 mg vaginally daily as needed for anxiety.   Yes Historical Provider, MD  diphenhydrAMINE (BENADRYL) 25 MG tablet Take 25 mg by mouth  every 6 (six) hours as needed for itching or allergies (itching).    Yes Historical Provider, MD  gabapentin (NEURONTIN) 300 MG capsule Take 300-600 mg by mouth 4 (four) times daily. Take 300 mg three times a day then 600 mg at bedtime   Yes Historical Provider, MD  Melatonin 5 MG TABS Take 5 mg by mouth 2 (two) times daily as needed (for sleep).   Yes Historical Provider, MD  pantoprazole (PROTONIX) 40 MG tablet Take 40 mg by mouth 2 (two) times daily.   Yes Historical Provider, MD  tapentadol (NUCYNTA) 50 MG TABS tablet Take 100 mg by mouth every 12 (twelve) hours.    Yes Historical Provider, MD  tiZANidine (ZANAFLEX) 4 MG tablet Take 8 mg by mouth at bedtime.    Yes Historical Provider, MD  traZODone (DESYREL) 100 MG tablet Take 200 mg by mouth at bedtime.    Yes Historical Provider, MD    Family History Family History    Problem Relation Age of Onset  . Diabetes Paternal Grandmother   . Leukemia Maternal Grandmother   . Arthritis Mother   . Prostate cancer Father   . Colitis Sister     Social History Social History  Substance Use Topics  . Smoking status: Never Smoker  . Smokeless tobacco: Never Used  . Alcohol use No     Allergies   Morphine and related; Septra [sulfamethoxazole-trimethoprim]; Sulfa antibiotics; Sulfamethoxazole-trimethoprim; Sulfasalazine; Ultram [tramadol]; Ciprofloxacin; Darvocet [propoxyphene n-acetaminophen]; Adhesive [tape]; Codeine; Demerol [meperidine]; Hydromorphone; and Losartan   Review of Systems Review of Systems  Constitutional:       Per HPI, otherwise negative  HENT:       Per HPI, otherwise negative  Respiratory:       Per HPI, otherwise negative  Cardiovascular:       Per HPI, otherwise negative  Gastrointestinal: Negative for vomiting.  Endocrine:       Negative aside from HPI  Genitourinary:       Neg aside from HPI   Musculoskeletal:       Per HPI, otherwise negative  Skin: Negative.   Neurological: Negative for syncope.     Physical Exam Updated Vital Signs There were no vitals taken for this visit.  Physical Exam  Constitutional: She is oriented to person, place, and time. She appears well-developed and well-nourished. No distress.  HENT:  Head: Normocephalic and atraumatic.  Eyes: Conjunctivae and EOM are normal.  Cardiovascular: Normal rate and regular rhythm.   Diminished pulses left upper extremity compared to the right side.  Pulmonary/Chest: Effort normal and breath sounds normal. No stridor. No respiratory distress.  Abdominal: She exhibits no distension.  Musculoskeletal: She exhibits no edema.  Neurological: She is alert and oriented to person, place, and time. No cranial nerve deficit. She exhibits normal muscle tone. Coordination normal.  Skin: Skin is warm and dry.  Psychiatric: She has a normal mood and affect.   Nursing note and vitals reviewed.    ED Treatments / Results  Labs (all labs ordered are listed, but only abnormal results are displayed) Labs Reviewed  BASIC METABOLIC PANEL - Abnormal; Notable for the following:       Result Value   Potassium 3.3 (*)    CO2 19 (*)    Glucose, Bld 114 (*)    All other components within normal limits  CBC  TROPONIN I    EKG  EKG Interpretation None       Radiology Dg Chest 2 View  Result Date: 05/08/2016 CLINICAL DATA:  Left chest pain radiating to the jaw pain and upper extremity, onset yesterday. EXAM: CHEST  2 VIEW COMPARISON:  02/14/2013 FINDINGS: The heart size and mediastinal contours are within normal limits. Both lungs are clear. The visualized skeletal structures are unremarkable. IMPRESSION: No active cardiopulmonary disease. Electronically Signed   By: Ellery Plunk M.D.   On: 05/08/2016 06:36   Ct Angio Chest Aorta W And/or Wo Contrast  Result Date: 05/08/2016 CLINICAL DATA:  Back pain, diminished pulse on the left. EXAM: CT ANGIOGRAPHY CHEST WITH CONTRAST TECHNIQUE: Multidetector CT imaging of the chest was performed using the standard protocol during bolus administration of intravenous contrast. Multiplanar CT image reconstructions and MIPs were obtained to evaluate the vascular anatomy. CONTRAST:  100 mL Isovue-370 COMPARISON:  None. FINDINGS: Cardiovascular: Preferential opacification of the thoracic aorta. No evidence of thoracic aortic aneurysm or dissection. Normal heart size. No pericardial effusion. Superior mesenteric artery and celiac artery are patent without focal narrowing or dissection. Bilateral renal arteries are patent. Mediastinum/Nodes: No enlarged mediastinal, hilar, or axillary lymph nodes. Thyroid gland, trachea, and esophagus demonstrate no significant findings. Lungs/Pleura: Lungs are clear. No pleural effusion or pneumothorax. Upper Abdomen: No acute upper abdominal abnormality. 17 mm hypodense, fluid  attenuating right inferior hepatic mass most consistent with a cyst. Musculoskeletal: No acute osseous abnormality. No lytic or sclerotic osseous lesion. Thoracic vertebral body heights are maintained and are in normal anatomic alignment. Review of the MIP images confirms the above findings. IMPRESSION: 1. No thoracic aortic dissection or aneurysm. 2. No acute cardiopulmonary disease. Electronically Signed   By: Elige Ko   On: 05/08/2016 09:22    Procedures Procedures (including critical care time)  Medications Ordered in ED Medications  sodium chloride 0.9 % bolus 1,000 mL (not administered)  LORazepam (ATIVAN) injection 0.5 mg (not administered)  iopamidol (ISOVUE-370) 76 % injection 100 mL (not administered)     Initial Impression / Assessment and Plan / ED Course  I have reviewed the triage vital signs and the nursing notes.  Pertinent labs & imaging results that were available during my care of the patient were reviewed by me and considered in my medical decision making (see chart for details).  On repeat exam the patient is calm. She now notes that she has a history of gastric bypass. She will receive additional medication for relief.  On repeat exam the patient is calm, states that she feels better. We discussed all findings again at length, including reassuring CT scan with no evidence for dissection, no evidence for ACS. Patient has no vital sign evidence for pulmonary embolism. Given her slight improvement with change in medication, there is some suspicion for gastroesophageal etiology. Patient will start a course of new medication, follow up with her gastroenterologist.   Final Clinical Impressions(s) / ED Diagnoses   Final diagnoses:  Atypical chest pain    New Prescriptions Discharge Medication List as of 05/08/2016 11:21 AM    START taking these medications   Details  famotidine (PEPCID) 20 MG tablet Take 1 tablet (20 mg total) by mouth 2 (two) times daily.,  Starting Sat 05/08/2016, Print         Gerhard Munch, MD 05/08/16 [phone]

## 2016-05-08 NOTE — ED Triage Notes (Signed)
Pt c/o left-sided chest pain that began yesterday and persisted through the night; pain radiates to jaw; endorses diaphoresis and emesis; also reports having 3 nosebleeds; took carafate and protonix this am; was seen 3/8 and told that her lab work was normal

## 2016-05-08 NOTE — Discharge Instructions (Signed)
As discussed, your evaluation today has been largely reassuring.  But, it is important that you monitor your condition carefully, and do not hesitate to return to the ED if you develop new, or concerning changes in your condition. ? ?Otherwise, please follow-up with your physician for appropriate ongoing care. ? ?

## 2016-05-08 NOTE — ED Notes (Signed)
Patient transported to CT 

## 2016-05-08 NOTE — ED Notes (Signed)
Two unsuccessful blood draw attempts  

## 2016-05-10 DIAGNOSIS — M47816 Spondylosis without myelopathy or radiculopathy, lumbar region: Secondary | ICD-10-CM | POA: Diagnosis not present

## 2016-05-10 DIAGNOSIS — G894 Chronic pain syndrome: Secondary | ICD-10-CM | POA: Diagnosis not present

## 2016-05-10 DIAGNOSIS — M461 Sacroiliitis, not elsewhere classified: Secondary | ICD-10-CM | POA: Diagnosis not present

## 2016-05-10 DIAGNOSIS — G4700 Insomnia, unspecified: Secondary | ICD-10-CM | POA: Diagnosis not present

## 2016-05-10 MED FILL — GABAPENTIN 300 MG CAPSULE: 300 | 30 days supply | Qty: 240 | Fill #2

## 2016-05-10 MED FILL — diazePAM 5 MG TABS: 5 | 16 days supply | Qty: 50 | Fill #1

## 2016-05-11 ENCOUNTER — Ambulatory Visit: Payer: 59 | Admitting: Physical Therapy

## 2016-05-11 ENCOUNTER — Other Ambulatory Visit (HOSPITAL_COMMUNITY): Payer: 59

## 2016-05-11 ENCOUNTER — Other Ambulatory Visit: Payer: Self-pay | Admitting: *Deleted

## 2016-05-11 NOTE — Patient Outreach (Signed)
[facility]) Care Management  05/11/2016  Carly Delacruz 12/14/1965 [id number]   CSW received a return call from patient today, inquiring about the reason for CSW's call and HIPAA compliant message left on voicemail last week.  The conversation with patient was very brief, as patient admitted to having a track phone with limited minutes.  CSW was able to explain the reason for the call, inquiring as to whether or not patient would be interested in receiving counseling and supportive services for symptoms of depression, or have CSW make a referral for patient to an agency in network with patient's insurance carrier, [employer].  CSW further explained to patient that she is eligible to receive three fee counseling sessions through [employer]) as a benefit of being employed with the [employer].  In addition, CSW agreed to send patient materials regarding depression, as well as information about the [facility].  Shortly thereafter, patient requested to terminate the call, agreeing to return CSW's call when she is able to afford to pay for more minutes on her phone.  CSW voiced understanding, agreeing to place resource material in the mail to patient today.  CSW will also include a [facility] Welcome Packet, along with a Consent for Treatment form.  If CSW does not receive a return call from patient within the next week, CSW will make an outreach attempt to patient to try and complete the initial phone assessment, as well as assess and assist with any additional social work needs and services. Danford BadJoanna Saporito, BSW, MSW, LCSW  Licensed Restaurant manager, fast foodClinical Social Worker  [facility] Care Management [place] [facility]  Mailing [place], [place], [place] [address] Physical Address-[address], [place], [place] [address] Toll Free Main # [phone] Fax # [phone] Cell #  [phone]  Office # [phone] [email or web address]

## 2016-05-13 NOTE — Patient Outreach (Signed)
[facility]) Care Management  05/13/2016  Carly Delacruz 02/16/1966 [id number]   Request received from Danford BadJoanna Saporito, LCSW to mail patient community resource information.   [name], B.A.  [facility] Care [facility] Assistant

## 2016-05-18 ENCOUNTER — Other Ambulatory Visit: Payer: Self-pay | Admitting: *Deleted

## 2016-05-18 MED FILL — traZODone HCL 100 MG TABS: 100 | 30 days supply | Qty: 60 | Fill #0

## 2016-05-18 MED FILL — tiZANidine HCL 4 MG TABS: 4 | 30 days supply | Qty: 90 | Fill #2

## 2016-05-18 NOTE — Patient Outreach (Signed)
[facility]) Care Management  05/18/2016  [place] Pihamela J Kipper 01/26/1966 [id number]   CSW made an attempt to try and contact patient today to follow-up regarding social work services and resources, as well as to ensure that patient received the packet of resource information mailed to patient's home, but patient was unavailable.  A HIPAA compliant message was left for patient on voicemail and CSW is currently awaiting a return call.  If CSW does not receive a return call from patient within the next week, CSW will make a second outreach attempt. Danford BadJoanna Mikael Debell, BSW, MSW, LCSW  Licensed Restaurant manager, fast foodClinical Social Worker  [facility] Care Management [place] [facility]  Mailing [place], [place], [place] [address] Physical Address-[address], [place], [place] [address] Toll Free Main # [phone] Fax # [phone] Cell # [phone]  Office # [phone] [email or web address]

## 2016-05-24 MED FILL — NUCYNTA 50 MG TABLET: 50 | 30 days supply | Qty: 120 | Fill #0

## 2016-05-25 ENCOUNTER — Other Ambulatory Visit: Payer: Self-pay | Admitting: *Deleted

## 2016-05-25 DIAGNOSIS — Z1231 Encounter for screening mammogram for malignant neoplasm of breast: Secondary | ICD-10-CM | POA: Diagnosis not present

## 2016-05-25 DIAGNOSIS — K219 Gastro-esophageal reflux disease without esophagitis: Secondary | ICD-10-CM | POA: Diagnosis not present

## 2016-05-25 DIAGNOSIS — E785 Hyperlipidemia, unspecified: Secondary | ICD-10-CM | POA: Diagnosis not present

## 2016-05-25 DIAGNOSIS — G894 Chronic pain syndrome: Secondary | ICD-10-CM | POA: Diagnosis not present

## 2016-05-25 DIAGNOSIS — M1990 Unspecified osteoarthritis, unspecified site: Secondary | ICD-10-CM | POA: Diagnosis not present

## 2016-05-25 DIAGNOSIS — Z0000 Encounter for general adult medical examination without abnormal findings: Secondary | ICD-10-CM | POA: Diagnosis not present

## 2016-05-25 DIAGNOSIS — I10 Essential (primary) hypertension: Secondary | ICD-10-CM | POA: Diagnosis not present

## 2016-05-25 DIAGNOSIS — R413 Other amnesia: Secondary | ICD-10-CM | POA: Diagnosis not present

## 2016-05-25 MED FILL — AMLODIPINE BESYLATE 5 MG TA: 5 | 30 days supply | Qty: 30 | Fill #0

## 2016-05-25 MED FILL — FAMOTIDINE 20 MG TABLET: 20 | 30 days supply | Qty: 30 | Fill #0

## 2016-05-25 NOTE — Patient Outreach (Signed)
[facility]) Care Management  05/25/2016  [place] Pihamela J Wenk 03/07/1965 [id number]   CSW made a second attempt to try and contact patient today to follow-up regarding social work services and resources, as well as to continue to assess need for continued social work involvement; however, patient was unavailable.  A HIPAA complaint message was left for patient on voicemail.  CSW is currently awaiting a return call.  CSW will make a third and final outreach attempt in one week, if CSW does not receive a return call from patient in the meantime. Danford BadJoanna Roizy Harold, BSW, MSW, LCSW  Licensed Restaurant manager, fast foodClinical Social Worker  [facility] Care Management [place] [facility]  Mailing [place], [place], [place] [address] Physical Address-[address], [place], [place] [address] Toll Free Main # [phone] Fax # [phone] Cell # [phone]  Office # [phone] [email or web address]

## 2016-06-02 ENCOUNTER — Encounter: Payer: Self-pay | Admitting: *Deleted

## 2016-06-02 ENCOUNTER — Other Ambulatory Visit: Payer: Self-pay | Admitting: *Deleted

## 2016-06-02 NOTE — Patient Outreach (Signed)
[facility]) Care Management  06/02/2016  Carly Delacruz Nov 27, 1965 [id number]   CSW made a third and final attempt to try and contact patient today to perform phone assessment, as well as assess and assist with social work needs and services, without success.  A HIPAA compliant message was left for patient on voicemail.  CSW  continues to await a return call.  CSW will mail an outreach letter to patient's home, encouraging patient to contact CSW at their earliest convenience, if patient is interested in receiving social work services through CSW with [facility].  If CSW does not receive a return call from patient within the next 10 business days, CSW will proceed with case closure.  Required number of phone attempts will have been made and outreach letter mailed.   Danford Bad, BSW, MSW, LCSW  Licensed Restaurant manager, fast food [facility] System  Mailing [place] [address] [place], [place] [address] Physical Address-[address], [place], [place] [address] Toll Free Main # [phone] Fax # [phone] Cell # [phone]  Office # [phone] [email or web address]

## 2016-06-14 MED FILL — traZODone HCL 100 MG TABS: 100 | 30 days supply | Qty: 60 | Fill #1

## 2016-06-14 MED FILL — tiZANidine HCL 4 MG TABS: 4 | 30 days supply | Qty: 90 | Fill #3

## 2016-06-16 ENCOUNTER — Other Ambulatory Visit: Payer: Self-pay | Admitting: *Deleted

## 2016-06-16 ENCOUNTER — Encounter: Payer: Self-pay | Admitting: *Deleted

## 2016-06-16 NOTE — Patient Outreach (Signed)
[facility]) Care Management  06/16/2016  Carly Delacruz 08-22-1965 [id number]   CSW will perform a case closure on patient, due to inability to establish initial phone contact with patient, despite required number of phone attempts made and outreach letter mailed to patient's home.  CSW will fax an update to patient's Primary Care Physician, Dr. Mendel Ryder to ensure that they are aware of CSW's involvement with patient's plan of care.  CSW will submit a case closure request to Tomasita Crumble, Care Management Assistant with [facility] Care Management, in the form of an In [employer].   Danford Bad, BSW, MSW, LCSW  Licensed Restaurant manager, fast food [facility]  Mailing [place] [address], [place], [place] [address] Physical Address-[address], [place], [place] [address] Toll Free Main # [phone] Fax # [phone] Cell # [phone]  Office # [phone] [email or web address]

## 2016-06-21 ENCOUNTER — Telehealth: Payer: Self-pay | Admitting: *Deleted

## 2016-06-21 ENCOUNTER — Encounter: Payer: Self-pay | Admitting: Physician Assistant

## 2016-06-21 NOTE — Telephone Encounter (Signed)
I'm unable to reach Ms.Ave Filter by phone,a letter was mailed.

## 2016-06-23 MED FILL — GABAPENTIN 300 MG CAPSULE: 300 | 30 days supply | Qty: 240 | Fill #3

## 2016-06-23 MED FILL — FAMOTIDINE 20 MG TABLET: 20 | 30 days supply | Qty: 30 | Fill #1

## 2016-06-28 ENCOUNTER — Other Ambulatory Visit: Payer: Self-pay | Admitting: Occupational Medicine

## 2016-06-28 ENCOUNTER — Ambulatory Visit: Payer: Self-pay

## 2016-06-28 DIAGNOSIS — M25562 Pain in left knee: Secondary | ICD-10-CM

## 2016-06-29 ENCOUNTER — Telehealth (INDEPENDENT_AMBULATORY_CARE_PROVIDER_SITE_OTHER): Payer: Self-pay | Admitting: Orthopaedic Surgery

## 2016-06-29 DIAGNOSIS — M25572 Pain in left ankle and joints of left foot: Secondary | ICD-10-CM

## 2016-06-29 NOTE — Telephone Encounter (Signed)
Refer to PT for eval and treat ankle sprain.  Modalities prn.

## 2016-06-29 NOTE — Telephone Encounter (Signed)
See message below °

## 2016-06-29 NOTE — Telephone Encounter (Signed)
Pt called asking what she needs to do next because she never did PT through [place]. What is her next steps. 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  Work [phone] Home [phone]

## 2016-06-30 NOTE — Telephone Encounter (Signed)
Tired to call all  3 numbers (work, home, mobile ) no answer could not leave VM in Mobile phone.   [facility] and they will call her and schedule appt.

## 2016-06-30 NOTE — Addendum Note (Signed)
Addended by: Albertina Parr on: 06/30/2016 01:17 PM   Modules accepted: Orders

## 2016-07-07 DIAGNOSIS — N9489 Other specified conditions associated with female genital organs and menstrual cycle: Secondary | ICD-10-CM | POA: Diagnosis not present

## 2016-07-07 DIAGNOSIS — F329 Major depressive disorder, single episode, unspecified: Secondary | ICD-10-CM | POA: Diagnosis not present

## 2016-07-07 DIAGNOSIS — Z6839 Body mass index (BMI) 39.0-39.9, adult: Secondary | ICD-10-CM | POA: Diagnosis not present

## 2016-07-09 MED FILL — tiZANidine HCL 4 MG TABS: 4 | 30 days supply | Qty: 90 | Fill #0

## 2016-07-09 MED FILL — diazePAM 5 MG TABS: 5 | 16 days supply | Qty: 50 | Fill #0

## 2016-07-09 MED FILL — traZODone HCL 100 MG TABS: 100 | 30 days supply | Qty: 60 | Fill #2

## 2016-07-14 MED FILL — NUCYNTA 50 MG TABLET: 50 | 30 days supply | Qty: 120 | Fill #0

## 2016-07-30 DIAGNOSIS — M47816 Spondylosis without myelopathy or radiculopathy, lumbar region: Secondary | ICD-10-CM | POA: Diagnosis not present

## 2016-07-30 DIAGNOSIS — M461 Sacroiliitis, not elsewhere classified: Secondary | ICD-10-CM | POA: Diagnosis not present

## 2016-07-30 DIAGNOSIS — G4700 Insomnia, unspecified: Secondary | ICD-10-CM | POA: Diagnosis not present

## 2016-07-30 DIAGNOSIS — G894 Chronic pain syndrome: Secondary | ICD-10-CM | POA: Diagnosis not present

## 2016-07-30 MED FILL — CLINDAMYCIN HCL 300 MG CAP: 300 | 7 days supply | Qty: 21 | Fill #0

## 2016-07-30 MED FILL — FAMOTIDINE 20 MG TABLET: 20 | 90 days supply | Qty: 90 | Fill #2

## 2016-07-30 MED FILL — GABAPENTIN 300 MG CAPSULE: 300 | 30 days supply | Qty: 240 | Fill #0

## 2016-07-30 MED FILL — IBUPROFEN 600 MG TABLET: 600 | 3 days supply | Qty: 10 | Fill #0

## 2016-08-06 DIAGNOSIS — E559 Vitamin D deficiency, unspecified: Secondary | ICD-10-CM | POA: Diagnosis not present

## 2016-08-06 DIAGNOSIS — I10 Essential (primary) hypertension: Secondary | ICD-10-CM | POA: Diagnosis not present

## 2016-08-06 DIAGNOSIS — F339 Major depressive disorder, recurrent, unspecified: Secondary | ICD-10-CM | POA: Diagnosis not present

## 2016-08-06 DIAGNOSIS — M1990 Unspecified osteoarthritis, unspecified site: Secondary | ICD-10-CM | POA: Diagnosis not present

## 2016-08-06 DIAGNOSIS — R252 Cramp and spasm: Secondary | ICD-10-CM | POA: Diagnosis not present

## 2016-08-06 MED FILL — tiZANidine HCL 4 MG TABS: 4 | 30 days supply | Qty: 90 | Fill #1

## 2016-08-06 MED FILL — traZODone HCL 100 MG TABS: 100 | 30 days supply | Qty: 60 | Fill #3

## 2016-08-09 MED FILL — VIT D2 1.25 MG (50,000 UNIT: 1.25 MG | 56 days supply | Qty: 8 | Fill #0

## 2016-08-12 ENCOUNTER — Emergency Department (HOSPITAL_COMMUNITY): Payer: 59

## 2016-08-12 ENCOUNTER — Encounter (HOSPITAL_COMMUNITY): Payer: Self-pay | Admitting: Emergency Medicine

## 2016-08-12 ENCOUNTER — Inpatient Hospital Stay (HOSPITAL_COMMUNITY)
Admission: EM | Admit: 2016-08-12 | Discharge: 2016-08-16 | DRG: 092 | Disposition: A | Discharge status: Home / Self Care | Payer: 59 | Attending: Internal Medicine | Admitting: Internal Medicine

## 2016-08-12 DIAGNOSIS — R4701 Aphasia: Secondary | ICD-10-CM | POA: Diagnosis not present

## 2016-08-12 DIAGNOSIS — G4733 Obstructive sleep apnea (adult) (pediatric): Secondary | ICD-10-CM | POA: Diagnosis present

## 2016-08-12 DIAGNOSIS — F329 Major depressive disorder, single episode, unspecified: Secondary | ICD-10-CM | POA: Diagnosis present

## 2016-08-12 DIAGNOSIS — N390 Urinary tract infection, site not specified: Secondary | ICD-10-CM | POA: Diagnosis not present

## 2016-08-12 DIAGNOSIS — M545 Low back pain: Secondary | ICD-10-CM | POA: Diagnosis present

## 2016-08-12 DIAGNOSIS — Z79899 Other long term (current) drug therapy: Secondary | ICD-10-CM

## 2016-08-12 DIAGNOSIS — R8290 Unspecified abnormal findings in urine: Secondary | ICD-10-CM | POA: Diagnosis present

## 2016-08-12 DIAGNOSIS — G8929 Other chronic pain: Secondary | ICD-10-CM | POA: Diagnosis present

## 2016-08-12 DIAGNOSIS — R410 Disorientation, unspecified: Secondary | ICD-10-CM | POA: Diagnosis not present

## 2016-08-12 DIAGNOSIS — Z91048 Other nonmedicinal substance allergy status: Secondary | ICD-10-CM

## 2016-08-12 DIAGNOSIS — Z6841 Body Mass Index (BMI) 40.0 and over, adult: Secondary | ICD-10-CM | POA: Diagnosis not present

## 2016-08-12 DIAGNOSIS — D649 Anemia, unspecified: Secondary | ICD-10-CM | POA: Diagnosis present

## 2016-08-12 DIAGNOSIS — E860 Dehydration: Secondary | ICD-10-CM | POA: Diagnosis present

## 2016-08-12 DIAGNOSIS — G9341 Metabolic encephalopathy: Secondary | ICD-10-CM | POA: Diagnosis present

## 2016-08-12 DIAGNOSIS — Z87440 Personal history of urinary (tract) infections: Secondary | ICD-10-CM | POA: Diagnosis present

## 2016-08-12 DIAGNOSIS — T424X5A Adverse effect of benzodiazepines, initial encounter: Secondary | ICD-10-CM | POA: Diagnosis not present

## 2016-08-12 DIAGNOSIS — I10 Essential (primary) hypertension: Secondary | ICD-10-CM | POA: Diagnosis present

## 2016-08-12 DIAGNOSIS — G92 Toxic encephalopathy: Secondary | ICD-10-CM | POA: Diagnosis not present

## 2016-08-12 DIAGNOSIS — E876 Hypokalemia: Secondary | ICD-10-CM | POA: Diagnosis present

## 2016-08-12 DIAGNOSIS — R030 Elevated blood-pressure reading, without diagnosis of hypertension: Secondary | ICD-10-CM | POA: Diagnosis not present

## 2016-08-12 DIAGNOSIS — Z9884 Bariatric surgery status: Secondary | ICD-10-CM | POA: Diagnosis not present

## 2016-08-12 DIAGNOSIS — R4182 Altered mental status, unspecified: Secondary | ICD-10-CM | POA: Diagnosis not present

## 2016-08-12 DIAGNOSIS — N8189 Other female genital prolapse: Secondary | ICD-10-CM | POA: Diagnosis present

## 2016-08-12 DIAGNOSIS — Z882 Allergy status to sulfonamides status: Secondary | ICD-10-CM

## 2016-08-12 DIAGNOSIS — Z888 Allergy status to other drugs, medicaments and biological substances status: Secondary | ICD-10-CM

## 2016-08-12 DIAGNOSIS — M549 Dorsalgia, unspecified: Secondary | ICD-10-CM

## 2016-08-12 DIAGNOSIS — Z885 Allergy status to narcotic agent status: Secondary | ICD-10-CM

## 2016-08-12 DIAGNOSIS — Z881 Allergy status to other antibiotic agents status: Secondary | ICD-10-CM

## 2016-08-12 DIAGNOSIS — M1990 Unspecified osteoarthritis, unspecified site: Secondary | ICD-10-CM | POA: Diagnosis present

## 2016-08-12 DIAGNOSIS — Z90710 Acquired absence of both cervix and uterus: Secondary | ICD-10-CM

## 2016-08-12 LAB — RAPID URINE DRUG SCREEN, HOSP PERFORMED
Amphetamines: NOT DETECTED
BARBITURATES: NOT DETECTED
Benzodiazepines: POSITIVE — AB
COCAINE: NOT DETECTED
Opiates: NOT DETECTED
TETRAHYDROCANNABINOL: NOT DETECTED

## 2016-08-12 LAB — I-STAT CG4 LACTIC ACID, ED: LACTIC ACID, VENOUS: 1.46 mmol/L (ref 0.5–1.9)

## 2016-08-12 LAB — CBC WITH DIFFERENTIAL/PLATELET
BASOS ABS: 0 10*3/uL (ref 0.0–0.1)
Basophils Relative: 0 %
EOS PCT: 0 %
Eosinophils Absolute: 0 10*3/uL (ref 0.0–0.7)
HEMATOCRIT: 44.7 % (ref 36.0–46.0)
Hemoglobin: 14 g/dL (ref 12.0–15.0)
LYMPHS PCT: 20 %
Lymphs Abs: 1.5 10*3/uL (ref 0.7–4.0)
MCH: 27.3 pg (ref 26.0–34.0)
MCHC: 31.3 g/dL (ref 30.0–36.0)
MCV: 87.3 fL (ref 78.0–100.0)
MONO ABS: 0.4 10*3/uL (ref 0.1–1.0)
Monocytes Relative: 6 %
NEUTROS ABS: 5.5 10*3/uL (ref 1.7–7.7)
Neutrophils Relative %: 74 %
PLATELETS: 315 10*3/uL (ref 150–400)
RBC: 5.12 MIL/uL — ABNORMAL HIGH (ref 3.87–5.11)
RDW: 14.1 % (ref 11.5–15.5)
WBC: 7.5 10*3/uL (ref 4.0–10.5)

## 2016-08-12 LAB — SALICYLATE LEVEL

## 2016-08-12 LAB — AMMONIA: AMMONIA: 13 umol/L (ref 9–35)

## 2016-08-12 LAB — URINALYSIS, ROUTINE W REFLEX MICROSCOPIC
Bilirubin Urine: NEGATIVE
Glucose, UA: NEGATIVE mg/dL
Hgb urine dipstick: NEGATIVE
KETONES UR: 80 mg/dL — AB
Nitrite: NEGATIVE
PH: 5 (ref 5.0–8.0)
PROTEIN: 30 mg/dL — AB
Specific Gravity, Urine: 1.028 (ref 1.005–1.030)

## 2016-08-12 LAB — PROCALCITONIN: Procalcitonin: <0.1 ng/mL

## 2016-08-12 LAB — COMPREHENSIVE METABOLIC PANEL
ALBUMIN: 4.4 g/dL (ref 3.5–5.0)
ALT: 18 U/L (ref 14–54)
ANION GAP: 11 (ref 5–15)
AST: 19 U/L (ref 15–41)
Alkaline Phosphatase: 71 U/L (ref 38–126)
BUN: 14 mg/dL (ref 6–20)
CHLORIDE: 102 mmol/L (ref 101–111)
CO2: 24 mmol/L (ref 22–32)
Calcium: 9.3 mg/dL (ref 8.9–10.3)
Creatinine, Ser: 1 mg/dL (ref 0.44–1.00)
GFR calc Af Amer: >60 mL/min (ref >60)
GFR calc non Af Amer: >60 mL/min (ref >60)
GLUCOSE: 104 mg/dL — AB (ref 65–99)
POTASSIUM: 3.2 mmol/L — AB (ref 3.5–5.1)
SODIUM: 137 mmol/L (ref 135–145)
Total Bilirubin: 0.8 mg/dL (ref 0.3–1.2)
Total Protein: 7.8 g/dL (ref 6.5–8.1)

## 2016-08-12 LAB — PROTIME-INR
INR: 1.02
PROTHROMBIN TIME: 13.4 s (ref 11.4–15.2)

## 2016-08-12 LAB — MAGNESIUM: Magnesium: 1.8 mg/dL (ref 1.7–2.4)

## 2016-08-12 LAB — PHOSPHORUS: Phosphorus: 3.2 mg/dL (ref 2.5–4.6)

## 2016-08-12 LAB — ETHANOL: Alcohol, Ethyl (B): <5 mg/dL (ref <5)

## 2016-08-12 LAB — CK: CK TOTAL: 85 U/L (ref 38–234)

## 2016-08-12 LAB — ACETAMINOPHEN LEVEL: Acetaminophen (Tylenol), Serum: <10 ug/mL — ABNORMAL LOW (ref 10–30)

## 2016-08-12 MED ORDER — CEFTRIAXONE SODIUM 1 G IJ SOLR: 1.0000 g | Freq: Once | INTRAMUSCULAR | Status: DC

## 2016-08-12 MED ORDER — SODIUM CHLORIDE 0.9% FLUSH
3.0000 mL | Freq: Two times a day (BID) | INTRAVENOUS | Status: DC
  Administered 2016-08-13 – 2016-08-16 (×6): 3 mL via INTRAVENOUS

## 2016-08-12 MED ORDER — DEXTROSE 5 % IV SOLN
1.0000 g | INTRAVENOUS | Status: DC
  Administered 2016-08-13 – 2016-08-15 (×3): 1 g via INTRAVENOUS
  Filled 2016-08-12 (×5): qty 10

## 2016-08-12 MED ORDER — ENOXAPARIN SODIUM 40 MG/0.4ML ~~LOC~~ SOLN
40.0000 mg | SUBCUTANEOUS | Status: DC
  Administered 2016-08-12 – 2016-08-15 (×4): 40 mg via SUBCUTANEOUS
  Filled 2016-08-12 (×4): qty 0.4

## 2016-08-12 MED ORDER — DIAZEPAM 2 MG PO TABS
2.0000 mg | ORAL_TABLET | Freq: Every day | ORAL | Status: DC | PRN
  Administered 2016-08-14: 2 mg via ORAL
  Filled 2016-08-12 (×3): qty 1

## 2016-08-12 MED ORDER — ACETAMINOPHEN 325 MG PO TABS
650.0000 mg | ORAL_TABLET | Freq: Four times a day (QID) | ORAL | Status: DC | PRN
  Filled 2016-08-12: qty 2

## 2016-08-12 MED ORDER — SODIUM CHLORIDE 0.9 % IV SOLN
INTRAVENOUS | Status: DC
  Administered 2016-08-12 – 2016-08-13 (×3): via INTRAVENOUS

## 2016-08-12 MED ORDER — TAPENTADOL HCL 50 MG PO TABS
50.0000 mg | ORAL_TABLET | Freq: Two times a day (BID) | ORAL | Status: DC | PRN
  Administered 2016-08-13 – 2016-08-15 (×3): 50 mg via ORAL
  Filled 2016-08-12 (×3): qty 1

## 2016-08-12 MED ORDER — FAMOTIDINE 20 MG PO TABS
20.0000 mg | ORAL_TABLET | Freq: Two times a day (BID) | ORAL | Status: DC
  Administered 2016-08-12 – 2016-08-16 (×8): 20 mg via ORAL
  Filled 2016-08-12 (×8): qty 1

## 2016-08-12 MED ORDER — BUPROPION HCL ER (XL) 150 MG PO TB24
150.0000 mg | ORAL_TABLET | Freq: Every day | ORAL | Status: DC
  Administered 2016-08-13: 150 mg via ORAL
  Filled 2016-08-12 (×4): qty 1

## 2016-08-12 MED ORDER — AMLODIPINE BESYLATE 10 MG PO TABS
10.0000 mg | ORAL_TABLET | Freq: Every day | ORAL | Status: DC
  Administered 2016-08-13 – 2016-08-16 (×4): 10 mg via ORAL
  Filled 2016-08-12 (×4): qty 1

## 2016-08-12 MED ORDER — ACETAMINOPHEN 650 MG RE SUPP
650.0000 mg | Freq: Four times a day (QID) | RECTAL | Status: DC | PRN
Start: 2016-08-12 — End: 2016-08-16

## 2016-08-12 MED ORDER — IRBESARTAN 300 MG PO TABS
150.0000 mg | ORAL_TABLET | Freq: Every day | ORAL | Status: DC
  Administered 2016-08-13 – 2016-08-16 (×4): 150 mg via ORAL
  Filled 2016-08-12 (×4): qty 1

## 2016-08-12 MED ORDER — TIZANIDINE HCL 4 MG PO TABS
4.0000 mg | ORAL_TABLET | Freq: Every day | ORAL | Status: DC
  Administered 2016-08-12 – 2016-08-15 (×4): 4 mg via ORAL
  Filled 2016-08-12 (×4): qty 1

## 2016-08-12 MED ORDER — TRAZODONE HCL 100 MG PO TABS
100.0000 mg | ORAL_TABLET | Freq: Every day | ORAL | Status: DC
  Administered 2016-08-12 – 2016-08-15 (×4): 100 mg via ORAL
  Filled 2016-08-12 (×4): qty 1

## 2016-08-12 MED ORDER — AMLODIPINE BESYLATE-VALSARTAN 10-160 MG PO TABS: 1.0000 | ORAL_TABLET | Freq: Every day | ORAL | Status: DC

## 2016-08-12 MED ORDER — DEXTROSE 5 % IV SOLN
1.0000 g | Freq: Once | INTRAVENOUS | Status: AC
  Administered 2016-08-12: 1 g via INTRAVENOUS
  Filled 2016-08-12: qty 10

## 2016-08-12 NOTE — H&P (Signed)
History and Physical    Carly Delacruz NWG:[id number] DOB: 03/12/1965 DOA: 08/12/2016   PCP: Tommy RainwaterShamleffer, Ibethal Jaralla, MD   Patient coming from/Resides with: Private residence/lives alone   Chief Complaint: Altered mental status  HPI: Carly Pihamela J Bowditch is a [age] y.o. female with medical history significant for obesity, chronic low back pain, sleep apnea not tolerant to CPAP, hypertension, osteoarthritis, history of recurrent UTI who was sent to the hospital due to altered mentation. Patient apparently had not reported to work in 2 days and had not come to her usual PT appointment. Family was concerned and sent the police over to the patient's home for a wellness check. Upon their arrival the patient was ambulatory and awake but was intermittently aphasic/having difficulty expressing herself had a "spaced out" gaze. Of note, at baseline patient is quite functional and works as a Licensed conveyancerunit secretary at [employer]. CT of the head as well as MRI of the head were negative in the ER. Urine drug screen was positive for benzodiazepines which the patient has been prescribed. Patient did have an abnormal urinalysis that was concerning for UTI. Patient continues to have difficulty expressing herself and has no recollection of the events at home prior to arrival. She does seem to blame malfunction of her phone as the reason her family and friends were unable to contact her. As best she can remember she denies any new medications. She is unsure if she took her medications as prescribed or at all prior to coming to the hospital for the past several days.  ED Course:  Vital Signs: BP 138/88   Pulse (!) 105   Temp 99 F (37.2 C)   Resp (!) 24   SpO2 99%  CT head: Negative MRI brain: Negative Lab data: Sodium 137, potassium 3.2, chloride 102, CO2 24, glucose 104, BUN 14, creatinine 1.00, LFTs normal, ammonia level XIII, lactic acid 1.46, white count 7500 with normal differential, hemoglobin 14, coags  normal, urinalysis abnormal with cloudy appearance, many bacteria, amber color, 80 ketones, large leukocytes, 30 protein, 6-30 squamous epithelials, 6-30 WBCs, alcohol <5, urine drug screen negative except for expected benzodiazepines, urine culture pending Medications and treatments: Rocephin 1 g IV 1  Review of Systems:  **Patient attempts to give accurate history of present illness the due to expressive issues as well as apparent confusion she is unable to give accurate history   Past Medical History:  Diagnosis Date  . Allergy   . Anemia   . Arthritis 12/10/03  . Atrophic vaginitis 2009  . BV (bacterial vaginosis) 2006  . Chronic back pain   . Chronic LLQ pain   . Complication of anesthesia   . Elevated LFTs   . Endometriosis   . Fatigue 2012  . Gallstones   . GERD (gastroesophageal reflux disease)   . H/O bladder infections   . H/O mumps   . H/O varicella   . History of candidal vulvovaginitis 03/21/03  . HTN (hypertension)   . Insomnia   . Insomnia secondary to situational depression 06/06/07  . Liver mass, right lobe 08/13/04   Nodule  . Malaise 2012  . Morbid obesity (HCC) 2005  . Osteoporosis   . S/P gastric bypass   . Sleep apnea 2005  . SUI (stress urinary incontinence, female) 08/12/03  . Trichomonas   . Vulvar lesion 03/21/03  . Yeast infection     Past Surgical History:  Procedure Laterality Date  . ABDOMINAL HYSTERECTOMY  2001  . APPENDECTOMY  2001  .  CHOLECYSTECTOMY  2011  . DILATION AND CURETTAGE OF UTERUS    . GASTRIC BYPASS  2007   [facility], Dr. Daphine Deutscher  . KNEE ARTHROSCOPY Left   . LAPAROTOMY    . TONSILLECTOMY    . WISDOM TOOTH EXTRACTION      Social History   Social History  . Marital status: Single    Spouse name: N/A  . Number of children: 0  . Years of education: N/A   Occupational History  . adnmission associate [employer]   Social History Main Topics  . Smoking status: Never Smoker  . Smokeless tobacco:  Never Used  . Alcohol use No  . Drug use: No  . Sexual activity: Yes    Birth control/ protection: Surgical     Comment: hysterectomy    Other Topics Concern  . Not on file   Social History Narrative  . No narrative on file    Mobility: Independent Work history: Works as a Licensed conveyancer at the [employer] affiliated with [employer]   Allergies  Allergen Reactions  . Morphine And Related Shortness Of Breath and Itching  . Septra [Sulfamethoxazole-Trimethoprim] Anaphylaxis and Hives  . Sulfa Antibiotics Anaphylaxis, Hives and Swelling    Throat swelling  . Sulfamethoxazole-Trimethoprim Anaphylaxis and Hives  . Sulfasalazine Anaphylaxis, Hives and Swelling    Throat swelling  . Ultram [Tramadol] Shortness Of Breath and Anxiety    "panic attacks"   . Ciprofloxacin     This medication was given along with a sulfa. Reaction is unknown at this time. Must list due to possible allergy.  Leodis Liverpool [Propoxyphene N-Acetaminophen] Itching  . Adhesive [Tape] Rash  . Codeine Itching    No change when benadryl is given  . Demerol [Meperidine] Nausea Only  . Hydromorphone Itching    No change when benadryl is given  . Losartan Palpitations    Family History  Problem Relation Age of Onset  . Diabetes Paternal Grandmother   . Leukemia Maternal Grandmother   . Arthritis Mother   . Prostate cancer Father   . Colitis Sister      Prior to Admission medications   Medication Sig Start Date End Date Taking? Authorizing Provider  AMLODIPINE BESYLATE-VALSARTAN PO Take by mouth daily.     [provider]  buPROPion (WELLBUTRIN XL) 300 MG 24 hr tablet Take 300 mg by mouth daily.    [provider]  CALCIUM PO Take 1 tablet by mouth every morning.     [provider]  diazepam (VALIUM) 5 MG tablet Place 5 mg vaginally daily as needed for anxiety.    [provider]  diphenhydrAMINE (BENADRYL) 25 MG tablet Take 25 mg by mouth every 6 (six) hours as  needed for itching or allergies (itching).     [provider]  famotidine (PEPCID) 20 MG tablet Take 1 tablet (20 mg total) by mouth 2 (two) times daily. 05/08/16   Gerhard Munch, MD  gabapentin (NEURONTIN) 300 MG capsule Take 300-600 mg by mouth 4 (four) times daily. Take 300 mg three times a day then 600 mg at bedtime    [provider]  Melatonin 5 MG TABS Take 5 mg by mouth 2 (two) times daily as needed (for sleep).    [provider]  tapentadol (NUCYNTA) 50 MG TABS tablet Take 100 mg by mouth every 12 (twelve) hours.     [provider]  tiZANidine (ZANAFLEX) 4 MG tablet Take 8 mg by mouth at  bedtime.     [provider]  traZODone (DESYREL) 100 MG tablet Take 200 mg by mouth at bedtime.     [provider]    Physical Exam: Vitals:   08/12/16 1315 08/12/16 1328 08/12/16 1531 08/12/16 1545  BP: 113/81  136/72 138/88  Pulse: 93  (!) 106 (!) 105  Resp: 20  (!) 24 (!) 24  Temp:  99 F (37.2 C)    TempSrc:      SpO2: 99%  96% 99%      Constitutional: Patient appears confused but is alert, does not appear toxic or acutely ill Eyes: PERRL, lids and conjunctivae normal ENMT: Mucous membranes are dry. Posterior pharynx clear of any exudate or lesions.Normal dentition.  Neck: normal, supple, no masses, no thyromegaly Respiratory: clear to auscultation bilaterally, no wheezing, no crackles. Normal respiratory effort. No accessory muscle use.  Cardiovascular: Regular rate and rhythm, no murmurs / rubs / gallops. No extremity edema. 2+ pedal pulses. No carotid bruits.  Abdomen: no tenderness, no masses palpated. No hepatosplenomegaly. Bowel sounds positive.  Musculoskeletal: no clubbing / cyanosis. No joint deformity upper and lower extremities. Good ROM, no contractures. Normal muscle tone. Left hip pain reproduced with left leg lifting Skin: no rashes, lesions, ulcers. No induration Neurologic: CN 2-12 grossly intact. Sensation  intact, DTR normal. Strength 5/5 x all 4 extremities.  Psychiatric: Patient is alert but clearly confused oriented to name and place only. She is having difficulty formulating a complete thought and has no recollection of events prior to coming to the hospital, she is able to recall some of her preadmission medications but unable to recall dosages or when she last took-her speech pattern is similar to a person with expressive Eugenie Birks is not anxious and becomes slightly frustrated with her inability to suppress her thoughts as desired   Labs on Admission: I have personally reviewed following labs and imaging studies  CBC:  Recent Labs Lab 08/12/16 1209  WBC 7.5  NEUTROABS 5.5  HGB 14.0  HCT 44.7  MCV 87.3  PLT 315   Basic Metabolic Panel:  Recent Labs Lab 08/12/16 1209  NA 137  K 3.2*  CL 102  CO2 24  GLUCOSE 104*  BUN 14  CREATININE 1.00  CALCIUM 9.3   GFR: CrCl cannot be calculated (Unknown ideal weight.). Liver Function Tests:  Recent Labs Lab 08/12/16 1209  AST 19  ALT 18  ALKPHOS 71  BILITOT 0.8  PROT 7.8  ALBUMIN 4.4   No results for input(s): LIPASE, AMYLASE in the last 168 hours.  Recent Labs Lab 08/12/16 1220  AMMONIA 13   Coagulation Profile:  Recent Labs Lab 08/12/16 1220  INR 1.02   Cardiac Enzymes: No results for input(s): CKTOTAL, CKMB, CKMBINDEX, TROPONINI in the last 168 hours. BNP (last 3 results) No results for input(s): PROBNP in the last 8760 hours. HbA1C: No results for input(s): HGBA1C in the last 72 hours. CBG: No results for input(s): GLUCAP in the last 168 hours. Lipid Profile: No results for input(s): CHOL, HDL, LDLCALC, TRIG, CHOLHDL, LDLDIRECT in the last 72 hours. Thyroid Function Tests: No results for input(s): TSH, T4TOTAL, FREET4, T3FREE, THYROIDAB in the last 72 hours. Anemia Panel: No results for input(s): VITAMINB12, FOLATE, FERRITIN, TIBC, IRON, RETICCTPCT in the last 72 hours. Urine analysis:      Component Value Date/Time   COLORURINE AMBER (A) 08/12/2016 1301   APPEARANCEUR CLOUDY (A) 08/12/2016 1301   LABSPEC 1.028 08/12/2016 1301   PHURINE 5.0 08/12/2016  1301   GLUCOSEU NEGATIVE 08/12/2016 1301   HGBUR NEGATIVE 08/12/2016 1301   BILIRUBINUR NEGATIVE 08/12/2016 1301   KETONESUR 80 (A) 08/12/2016 1301   PROTEINUR 30 (A) 08/12/2016 1301   UROBILINOGEN 0.2 06/15/2014 0258   NITRITE NEGATIVE 08/12/2016 1301   LEUKOCYTESUR LARGE (A) 08/12/2016 1301   Sepsis Labs: @LABRCNTIP (procalcitonin:4,lacticidven:4) )No results found for this or any previous visit (from the past 240 hour(s)).   Radiological Exams on Admission: Ct Head Wo Contrast  Result Date: 08/12/2016 CLINICAL DATA:  Confusion EXAM: CT HEAD WITHOUT CONTRAST TECHNIQUE: Contiguous axial images were obtained from the base of the skull through the vertex without intravenous contrast. COMPARISON:  06/14/2014 FINDINGS: Brain: No evidence of acute infarction, hemorrhage, hydrocephalus, extra-axial collection or mass lesion/mass effect. Vascular: Negative Skull: No acute or aggressive finding Sinuses/Orbits: Negative IMPRESSION: Negative head CT. Electronically Signed   By: Marnee Spring M.D.   On: 08/12/2016 12:56   Mr Brain Wo Contrast  Result Date: 08/12/2016 CLINICAL DATA:  Aphasia.  Confusion. EXAM: MRI HEAD WITHOUT CONTRAST TECHNIQUE: Multiplanar, multiecho pulse sequences of the brain and surrounding structures were obtained without intravenous contrast. COMPARISON:  Head CT 08/12/2016 and MRI 06/14/2014 FINDINGS: Brain: There is no evidence of acute infarct, intracranial hemorrhage, mass, midline shift, or extra-axial fluid collection. Incidental coarctation is again noted involving the right lateral ventricle frontal horn, likely congenital. There is no hydrocephalus. The brain is normal in signal. Cerebral volume is within normal limits for age. Vascular: Major intracranial vascular flow voids are preserved. Skull and  upper cervical spine: Unremarkable bone marrow signal. Sinuses/Orbits: Unremarkable orbits. Paranasal sinuses and mastoid air cells are clear. Other: None. IMPRESSION: Negative brain MRI. Electronically Signed   By: Sebastian Ache M.D.   On: 08/12/2016 15:27    EKG: (Independently reviewed) Sinus rhythm with ventricular rate 90 bpm, QTC 473 ms, normal regurgitation, no acute ischemic changes  Assessment/Plan Principal Problem:   Acute metabolic encephalopathy -Patient presents with altered mentation in the setting of "hissing in action" for at least 2 days negative CT head and MRI brain for any acute injuries-suspect polypharmacy -I have held her Neurontin and otherwise have decreased her psychotropic, anti-anxiety and pain medications by 50% -Possible acute UTI contributing -? Atypical seizures- check EEG  -Obtain CK -She is on a very low dose of prn Valium therefore do not suspect current symptoms are related to benzodiazepine withdrawal although if symptoms improve with administration of home medications, withdrawal syndrome could be contributing factor -Acetaminophen and salicylate levels  Active Problems:   Abnormal urinalysis/H/O bladder infections -Has abnormal urinalysis consistent with either dehydration or UTI -Empiric Rocephin -Follow-up urine culture -IV fluids at 125/hr -Check Procalcitonin    HTN (hypertension) -Continue preadmission amlodipine and valsartan    Chronic back pain -Of note patient complained of hip pain with left straight leg lift involving  -See above regarding polypharmacy concerns -Nucynta decreased from 100 mg scheduled every 12 hours to 50 mg every 12 hours prn -Zanaflex decreased from 8 mg at bedtime to 4 mg at bedtime -Valium 5 mg prn decreased to 2 mg prn daily -Adjunctive therapies: Trazodone decreased from 200 mg at at bedtime to 100 mg at at bedtime and Wellbutrin XL 300 mg daily decreased to 150 mg daily -Neurontin held for now -Benadryl  held -Melatonin held    Anemia -Baseline hemoglobin 12.5 -Current hemoglobin 14 and reflective of likely hemoconcentration from dehydration    Morbid obesity/S/P gastric bypass -Last documented weight in March was 226 lbs -  Weight has been steadily declining since 2015 when she had a peak weight of 248 pounds      DVT prophylaxis: Lovenox Code Status: Full  Family Communication: Close family friend with patient's permission  Disposition Plan: Home Consults called: None     Tiersa Dayley L. ANP-BC [facility] Pager [phone]   If 7PM-7AM, please contact night-coverage [email or web address] Password [email or web address]  08/12/2016, 4:40 PM

## 2016-08-12 NOTE — ED Notes (Signed)
Patient transported to MRI 

## 2016-08-12 NOTE — ED Triage Notes (Signed)
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 Her family called PD for wellness check. She has spacey gaze and has intermittent aphasia. She is confused intermittently and unable to answer most questions. She did not want to come but EMS was finally able to convince her. She looks dishelved and has not shown up to work in 2 days.

## 2016-08-12 NOTE — ED Notes (Signed)
Patient transported to CT 

## 2016-08-13 ENCOUNTER — Observation Stay (HOSPITAL_COMMUNITY): Payer: 59

## 2016-08-13 DIAGNOSIS — F329 Major depressive disorder, single episode, unspecified: Secondary | ICD-10-CM | POA: Diagnosis present

## 2016-08-13 DIAGNOSIS — E876 Hypokalemia: Secondary | ICD-10-CM | POA: Diagnosis not present

## 2016-08-13 DIAGNOSIS — Z9884 Bariatric surgery status: Secondary | ICD-10-CM | POA: Diagnosis not present

## 2016-08-13 DIAGNOSIS — G9341 Metabolic encephalopathy: Secondary | ICD-10-CM | POA: Diagnosis not present

## 2016-08-13 DIAGNOSIS — Z79899 Other long term (current) drug therapy: Secondary | ICD-10-CM | POA: Diagnosis not present

## 2016-08-13 DIAGNOSIS — I10 Essential (primary) hypertension: Secondary | ICD-10-CM

## 2016-08-13 DIAGNOSIS — D649 Anemia, unspecified: Secondary | ICD-10-CM | POA: Diagnosis present

## 2016-08-13 DIAGNOSIS — M545 Low back pain: Secondary | ICD-10-CM | POA: Diagnosis not present

## 2016-08-13 DIAGNOSIS — Z6841 Body Mass Index (BMI) 40.0 and over, adult: Secondary | ICD-10-CM | POA: Diagnosis not present

## 2016-08-13 DIAGNOSIS — N390 Urinary tract infection, site not specified: Secondary | ICD-10-CM | POA: Diagnosis not present

## 2016-08-13 DIAGNOSIS — Z882 Allergy status to sulfonamides status: Secondary | ICD-10-CM | POA: Diagnosis not present

## 2016-08-13 DIAGNOSIS — Z888 Allergy status to other drugs, medicaments and biological substances status: Secondary | ICD-10-CM | POA: Diagnosis not present

## 2016-08-13 DIAGNOSIS — G8929 Other chronic pain: Secondary | ICD-10-CM | POA: Diagnosis not present

## 2016-08-13 DIAGNOSIS — R4701 Aphasia: Secondary | ICD-10-CM | POA: Diagnosis present

## 2016-08-13 DIAGNOSIS — Z91048 Other nonmedicinal substance allergy status: Secondary | ICD-10-CM | POA: Diagnosis not present

## 2016-08-13 DIAGNOSIS — N8189 Other female genital prolapse: Secondary | ICD-10-CM | POA: Diagnosis present

## 2016-08-13 DIAGNOSIS — Z881 Allergy status to other antibiotic agents status: Secondary | ICD-10-CM | POA: Diagnosis not present

## 2016-08-13 DIAGNOSIS — G92 Toxic encephalopathy: Secondary | ICD-10-CM | POA: Diagnosis present

## 2016-08-13 DIAGNOSIS — E860 Dehydration: Secondary | ICD-10-CM | POA: Diagnosis present

## 2016-08-13 DIAGNOSIS — Z90710 Acquired absence of both cervix and uterus: Secondary | ICD-10-CM | POA: Diagnosis not present

## 2016-08-13 DIAGNOSIS — G4733 Obstructive sleep apnea (adult) (pediatric): Secondary | ICD-10-CM | POA: Diagnosis present

## 2016-08-13 DIAGNOSIS — Z885 Allergy status to narcotic agent status: Secondary | ICD-10-CM | POA: Diagnosis not present

## 2016-08-13 DIAGNOSIS — M1990 Unspecified osteoarthritis, unspecified site: Secondary | ICD-10-CM | POA: Diagnosis present

## 2016-08-13 DIAGNOSIS — Z87440 Personal history of urinary (tract) infections: Secondary | ICD-10-CM | POA: Diagnosis not present

## 2016-08-13 DIAGNOSIS — T424X5A Adverse effect of benzodiazepines, initial encounter: Secondary | ICD-10-CM | POA: Diagnosis present

## 2016-08-13 LAB — CBC
HEMATOCRIT: 38.4 % (ref 36.0–46.0)
HEMOGLOBIN: 12.2 g/dL (ref 12.0–15.0)
MCH: 27.6 pg (ref 26.0–34.0)
MCHC: 31.8 g/dL (ref 30.0–36.0)
MCV: 86.9 fL (ref 78.0–100.0)
Platelets: 277 10*3/uL (ref 150–400)
RBC: 4.42 MIL/uL (ref 3.87–5.11)
RDW: 14.1 % (ref 11.5–15.5)
WBC: 6 10*3/uL (ref 4.0–10.5)

## 2016-08-13 LAB — COMPREHENSIVE METABOLIC PANEL
ALBUMIN: 3.4 g/dL — AB (ref 3.5–5.0)
ALT: 13 U/L — ABNORMAL LOW (ref 14–54)
ANION GAP: 11 (ref 5–15)
AST: 16 U/L (ref 15–41)
Alkaline Phosphatase: 63 U/L (ref 38–126)
BUN: 13 mg/dL (ref 6–20)
CHLORIDE: 105 mmol/L (ref 101–111)
CO2: 25 mmol/L (ref 22–32)
Calcium: 8.4 mg/dL — ABNORMAL LOW (ref 8.9–10.3)
Creatinine, Ser: 0.83 mg/dL (ref 0.44–1.00)
GFR calc Af Amer: >60 mL/min (ref >60)
GFR calc non Af Amer: >60 mL/min (ref >60)
GLUCOSE: 95 mg/dL (ref 65–99)
Potassium: 3.3 mmol/L — ABNORMAL LOW (ref 3.5–5.1)
SODIUM: 141 mmol/L (ref 135–145)
Total Bilirubin: 0.6 mg/dL (ref 0.3–1.2)
Total Protein: 6.3 g/dL — ABNORMAL LOW (ref 6.5–8.1)

## 2016-08-13 LAB — HIV ANTIBODY (ROUTINE TESTING W REFLEX): HIV Screen 4th Generation wRfx: NONREACTIVE

## 2016-08-13 LAB — URINE CULTURE

## 2016-08-13 MED ORDER — POTASSIUM CHLORIDE CRYS ER 20 MEQ PO TBCR
40.0000 meq | EXTENDED_RELEASE_TABLET | Freq: Once | ORAL | Status: AC
  Administered 2016-08-13: 40 meq via ORAL
  Filled 2016-08-13: qty 2

## 2016-08-13 MED ORDER — SODIUM CHLORIDE 0.9 % IV SOLN
INTRAVENOUS | Status: DC
Start: 2016-08-13 — End: 2016-08-14
  Administered 2016-08-14: 03:00:00 via INTRAVENOUS

## 2016-08-13 NOTE — Progress Notes (Signed)
PROGRESS NOTE   Carly Delacruz  NWG:[id number]    DOB: 02/12/1966    DOA: 08/12/2016  PCP: Tommy RainwaterShamleffer, Ibethal Jaralla, MD   I have briefly reviewed patients previous medical [facility].  Brief Narrative:  51 y/o female, works as Diplomatic Services operational officersecretary at [employer], lives alone and ambulates with a cane, PMH of chronic LBP, s/p fall in Dec 2017, follows with pain Mx (Dr. Alroy BailiffBodeaux), pelvic floor dysfunction, chronic dysuria, recurrent UTI's, obesity, OSA not tolerant of CPAP, HTN, presented with AMS (ambulatory but speech difficulty/unable to express herself and "spaced out" gaze. She has no recollection of events. No recent medication changes or overuse. Admitted for acute encephalopthy> possibly d/t polypharmacy and UTI. Improved.   Assessment & Plan:   Principal Problem:   Acute metabolic encephalopathy Active Problems:   HTN (hypertension)   Abnormal urinalysis   Chronic back pain   Anemia   H/O bladder infections   Morbid obesity (HCC)   S/P gastric bypass   Polypharmacy   1. Acute toxic metabolic encephalopathy: possibly d/t chronic polypharmacy complicated by UTI. CT & MRI Head without acute findings. Medication doses reduced. Treating UTI. Follow EEG. As per sister, MS changes have resolved. Acetaminophen and Tylenol levels normal. Alcohol level: normal. UDS: Benzodiazepines + (on at home). Procalcitonin neg. 2. UTI: continue Ceftriaxone. UCx suggests contamination and not helpful 3. Dizziness: symptomatic today in upright position, but Orthostatic BP's negative. ? Deconditioning. Recheck orthostatics in am. PT eval. No vertigo. 4. HTN: Continue home meds. Controlled. 5. Chronic LBP: Home meds reduced as per H&P. No pain reported. Titrate back up as tolerated. Sees pain Mx as OP. 6. Hypokalemia: replace and follow. Mg normal. 7. Morbid Obesity, s/p Gastric Bypass: follows with Dr. Daphine DeutscherMartin, Bariatric Surgery    DVT prophylaxis: Lovenox Code Status: Full Family  Communication: Discussed with sister at bedside. Disposition: DC home when medically improved.   Consultants:  None  Procedures:  None  Antimicrobials:  Ceftriaxone   Subjective: Feels better. Cannot recollect clearly events that got her to the hospital. Knows she was confused. Periumbilical pain, new. Chronic dysuria. Some chills pta  ROS: per RN, dizzy, flushed and unsteady on standing - twice since this am.  Objective:  Vitals:   08/13/16 0608 08/13/16 0621 08/13/16 0822 08/13/16 1602  BP: (!) 143/76 98/70 136/72 113/62  Pulse: 60 95  (!) 106  Resp: 18 18  18   Temp: 98.7 F (37.1 C) 98.2 F (36.8 C)  99 F (37.2 C)  TempSrc: Oral Oral  Oral  SpO2: 99% 95%  99%  Weight:      Height:        Examination:  General exam: Pleasant middle aged female, lying comfortably in bed. Respiratory system: Clear to auscultation. Respiratory effort normal. Cardiovascular system: S1 & S2 heard, RRR. No JVD, murmurs, rubs, gallops or clicks. No pedal edema. Telemetry: SR. Gastrointestinal system: Abdomen is nondistended, soft and nontender. No organomegaly or masses felt. Normal bowel sounds heard. Central nervous system: Alert and oriented. No focal neurological deficits. Extremities: Symmetric 5 x 5 power. Skin: No rashes, lesions or ulcers Psychiatry: Judgement and insight appear normal. Mood & affect appropriate.     Data Reviewed: I have personally reviewed following labs and imaging studies  CBC:  Recent Labs Lab 08/12/16 1209 08/13/16 0520  WBC 7.5 6.0  NEUTROABS 5.5  --   HGB 14.0 12.2  HCT 44.7 38.4  MCV 87.3 86.9  PLT 315 277   Basic Metabolic  Panel:  Recent Labs Lab 08/12/16 1209 08/12/16 1904 08/13/16 0520  NA 137  --  141  K 3.2*  --  3.3*  CL 102  --  105  CO2 24  --  25  GLUCOSE 104*  --  95  BUN 14  --  13  CREATININE 1.00  --  0.83  CALCIUM 9.3  --  8.4*  MG  --  1.8  --   PHOS  --  3.2  --    Liver Function Tests:  Recent Labs Lab  08/12/16 1209 08/13/16 0520  AST 19 16  ALT 18 13*  ALKPHOS 71 63  BILITOT 0.8 0.6  PROT 7.8 6.3*  ALBUMIN 4.4 3.4*   Coagulation Profile:  Recent Labs Lab 08/12/16 1220  INR 1.02   Cardiac Enzymes:  Recent Labs Lab 08/12/16 1642  CKTOTAL 85     Recent Results (from the past 240 hour(s))  Urine Culture     Status: Abnormal   Collection Time: 08/12/16  1:01 PM  Result Value Ref Range Status   Specimen Description URINE, RANDOM  Final   Special Requests NONE  Final   Culture MULTIPLE SPECIES PRESENT, SUGGEST RECOLLECTION (A)  Final   Report Status 08/13/2016 FINAL  Final  Culture, blood (Routine X 2) w Reflex to ID Panel     Status: None (Preliminary result)   Collection Time: 08/12/16  4:40 PM  Result Value Ref Range Status   Specimen Description BLOOD LEFT ANTECUBITAL  Final   Special Requests   Final    BOTTLES DRAWN AEROBIC AND ANAEROBIC Blood Culture adequate volume   Culture NO GROWTH < 24 HOURS  Final   Report Status PENDING  Incomplete  Culture, blood (Routine X 2) w Reflex to ID Panel     Status: None (Preliminary result)   Collection Time: 08/12/16  4:40 PM  Result Value Ref Range Status   Specimen Description BLOOD BLOOD RIGHT FOREARM  Final   Special Requests IN PEDIATRIC BOTTLE Blood Culture adequate volume  Final   Culture NO GROWTH < 24 HOURS  Final   Report Status PENDING  Incomplete         Radiology Studies: Ct Head Wo Contrast  Result Date: 08/12/2016 CLINICAL DATA:  Confusion EXAM: CT HEAD WITHOUT CONTRAST TECHNIQUE: Contiguous axial images were obtained from the base of the skull through the vertex without intravenous contrast. COMPARISON:  06/14/2014 FINDINGS: Brain: No evidence of acute infarction, hemorrhage, hydrocephalus, extra-axial collection or mass lesion/mass effect. Vascular: Negative Skull: No acute or aggressive finding Sinuses/Orbits: Negative IMPRESSION: Negative head CT. Electronically Signed   By: Marnee Spring M.D.   On:  08/12/2016 12:56   Mr Brain Wo Contrast  Result Date: 08/12/2016 CLINICAL DATA:  Aphasia.  Confusion. EXAM: MRI HEAD WITHOUT CONTRAST TECHNIQUE: Multiplanar, multiecho pulse sequences of the brain and surrounding structures were obtained without intravenous contrast. COMPARISON:  Head CT 08/12/2016 and MRI 06/14/2014 FINDINGS: Brain: There is no evidence of acute infarct, intracranial hemorrhage, mass, midline shift, or extra-axial fluid collection. Incidental coarctation is again noted involving the right lateral ventricle frontal horn, likely congenital. There is no hydrocephalus. The brain is normal in signal. Cerebral volume is within normal limits for age. Vascular: Major intracranial vascular flow voids are preserved. Skull and upper cervical spine: Unremarkable bone marrow signal. Sinuses/Orbits: Unremarkable orbits. Paranasal sinuses and mastoid air cells are clear. Other: None. IMPRESSION: Negative brain MRI. Electronically Signed   By: Jolaine Click.D.  On: 08/12/2016 15:27        Scheduled Meds: . amLODipine  10 mg Oral Daily   And  . irbesartan  150 mg Oral Daily  . buPROPion  150 mg Oral Daily  . enoxaparin (LOVENOX) injection  40 mg Subcutaneous Q24H  . famotidine  20 mg Oral BID  . sodium chloride flush  3 mL Intravenous Q12H  . tiZANidine  4 mg Oral QHS  . traZODone  100 mg Oral QHS   Continuous Infusions: . sodium chloride 125 mL/hr at 08/13/16 1724  . cefTRIAXone (ROCEPHIN)  IV Stopped (08/13/16 1803)     LOS: 0 days     Tavaria Mackins, MD, FACP, FHM. [facility] Pager [phone]  If 7PM-7AM, please contact night-coverage [email or web address] Password [email or web address] 08/13/2016, 7:08 PM

## 2016-08-13 NOTE — Procedures (Signed)
EEG Report  Clinical History:  Altered mental status  Technical Summary:  A 19 channel digital EEG recording was performed using the 10-20 international system of electrode placement.  Bipolar and Referential montages were used.  The total recording time was approx 20 minutes.  Findings:  There is a posterior dominant rhythm of 10 Hz reactive to eye opening and closure.  Intermittently, there is bitemporal theta frequency slowing in a synchronous fashion.  There are no epileptiform discharges or electrographic seizures present.  Photic stimulation produced a symmetrical driving response and did not elicit any abnormalities.    Impression:  This is an essentially normal EEG.  The temporal lobe findings are likely due to effects of drowsiness +/- sedative drug effects.  There is no evidence of a seizure tendency on this recording.  If clinical suspicion remains high, then a more prolonged 72 hour ambulatory video EEG may be of additional value.     Weston SettleShervin Lyam Provencio, MS, MD

## 2016-08-13 NOTE — Progress Notes (Signed)
PT Cancellation Note  Patient Details Name: Carly Delacruz MRN: [id number] DOB: 05/09/1965   Cancelled Treatment:    Reason Eval/Treat Not Completed: Patient at procedure or test/unavailable Pt currently off the floor per RN. Will follow up as schedule allows.   Gladys DammeBrittany Kawan [place], PT, DPT  Ac[facility] Rehabilitation Services  Pager: [phone]    Lehman PromBrittany S Ramelo Oetken 08/13/2016, 1:54 PM

## 2016-08-13 NOTE — Consult Note (Signed)
   [facility]   08/13/2016  Carly Delacruz 07/17/1965 [id number]  [facility] and [facility] for resources.  Came by to speak with the patient but the patient is currently not in the room and off the unit. No current needs identified from the medical record this writer spoke with the inpatient RNCM, Elnita MaxwellCheryl about patient can be restarted if community care management needs are assessed.  For question, please contact:  Charlesetta ShanksVictoria Randall Rampersad, RN BSN CCM [facility]  [phone] business mobile phone Toll free office [phone]  '

## 2016-08-13 NOTE — Progress Notes (Signed)
EEG Completed; Results Pending  

## 2016-08-14 LAB — BASIC METABOLIC PANEL
ANION GAP: 5 (ref 5–15)
BUN: 9 mg/dL (ref 6–20)
CALCIUM: 8.1 mg/dL — AB (ref 8.9–10.3)
CHLORIDE: 109 mmol/L (ref 101–111)
CO2: 24 mmol/L (ref 22–32)
CREATININE: 0.83 mg/dL (ref 0.44–1.00)
Glucose, Bld: 95 mg/dL (ref 65–99)
Potassium: 3.3 mmol/L — ABNORMAL LOW (ref 3.5–5.1)
SODIUM: 138 mmol/L (ref 135–145)

## 2016-08-14 LAB — PROCALCITONIN: Procalcitonin: <0.1 ng/mL

## 2016-08-14 MED ORDER — POTASSIUM CHLORIDE CRYS ER 20 MEQ PO TBCR
30.0000 meq | EXTENDED_RELEASE_TABLET | ORAL | Status: AC
  Administered 2016-08-14 (×2): 30 meq via ORAL
  Filled 2016-08-14 (×2): qty 1

## 2016-08-14 NOTE — Plan of Care (Addendum)
Problem: Education: Goal: Knowledge of [place] [facility] Education information/materials will improve Outcome: Progressing POC reviewed with pt.   

## 2016-08-14 NOTE — Evaluation (Signed)
Physical Therapy Evaluation Patient Details Name: Carly Delacruz MRN: [id number] DOB: 04-May-1965 Today's Date: 08/14/2016   History of Present Illness  Pt is a 51 y/o female admitted secondary to acute toxic metabolic encephalopathy and UTI. PMH including but not limited to chronic LBP, endometriosis, HTN and gastric bypass sx in 2007.  Clinical Impression  Pt presented supine in bed with HOB elevated, awake and willing to participate in therapy session. Prior to admission, pt reported that she was mod I with functional mobility with use of SPC to ambulate and independent with ADLs. Pt continues to work at [employer] and drives. Pt ambulated in hallway with use of RW and supervision. PT will continue to follow pt acutely to ensure a safe d/c home.    Follow Up Recommendations No PT follow up    Equipment Recommendations  3in1 (PT)    Recommendations for Other Services       Precautions / Restrictions Precautions Precautions: Fall Restrictions Weight Bearing Restrictions: No      Mobility  Bed Mobility Overal bed mobility: Modified Independent                Transfers Overall transfer level: Needs assistance Equipment used: Rolling walker (2 wheeled) Transfers: Sit to/from Stand Sit to Stand: Supervision         General transfer comment: good technique, supervision for safety  Ambulation/Gait Ambulation/Gait assistance: Supervision Ambulation Distance (Feet): 150 Feet Assistive device: Rolling walker (2 wheeled) Gait Pattern/deviations: Step-through pattern;Decreased stride length Gait velocity: decreased Gait velocity interpretation: Below normal speed for age/gender General Gait Details: slow, steady gait, min guard for assist, no LOB  Stairs            Wheelchair Mobility    Modified Rankin (Stroke Patients Only)       Balance Overall balance assessment: Needs assistance Sitting-balance support: Feet supported Sitting balance-Leahy  Scale: Normal     Standing balance support: During functional activity;No upper extremity supported Standing balance-Leahy Scale: Fair                               Pertinent Vitals/Pain Pain Assessment: No/denies pain    Home Living Family/patient expects to be discharged to:: Private residence Living Arrangements: Alone Available Help at Discharge: Family;Available PRN/intermittently Type of Home: House Home Access: Stairs to enter Entrance Stairs-Rails: Doctor, general practice of Steps: 3 Home Layout: One level Home Equipment: Walker - 2 wheels;Cane - single point      Prior Function Level of Independence: Independent with assistive device(s)         Comments: pt reports that she uses a SPC to ambulate     Hand Dominance        Extremity/Trunk Assessment   Upper Extremity Assessment Upper Extremity Assessment: Overall WFL for tasks assessed    Lower Extremity Assessment Lower Extremity Assessment: Generalized weakness    Cervical / Trunk Assessment Cervical / Trunk Assessment: Kyphotic  Communication   Communication: No difficulties  Cognition Arousal/Alertness: Awake/alert Behavior During Therapy: WFL for tasks assessed/performed Overall Cognitive Status: Within Functional Limits for tasks assessed                                        General Comments      Exercises     Assessment/Plan    PT Assessment Patient needs continued  PT services  PT Problem List Decreased strength;Decreased balance;Decreased mobility;Decreased coordination;Decreased knowledge of use of DME;Decreased safety awareness;Cardiopulmonary status limiting activity       PT Treatment Interventions DME instruction;Gait training;Stair training;Functional mobility training;Therapeutic activities;Balance training;Neuromuscular re-education;Therapeutic exercise;Patient/family education    PT Goals (Current goals can be found in the Care Plan  section)  Acute Rehab PT Goals Patient Stated Goal: return home ASAP PT Goal Formulation: With patient Time For Goal Achievement: 08/28/16 Potential to Achieve Goals: Good    Frequency Min 3X/week   Barriers to discharge        Co-evaluation               AM-PAC PT "6 Clicks" Daily Activity  Outcome Measure Difficulty turning over in bed (including adjusting bedclothes, sheets and blankets)?: None Difficulty moving from lying on back to sitting on the side of the bed? : None Difficulty sitting down on and standing up from a chair with arms (e.g., wheelchair, bedside commode, etc,.)?: Total Help needed moving to and from a bed to chair (including a wheelchair)?: None Help needed walking in hospital room?: A Little Help needed climbing 3-5 steps with a railing? : A Little 6 Click Score: 19    End of Session Equipment Utilized During Treatment: Gait belt Activity Tolerance: Patient tolerated treatment well Patient left: in bed;with call bell/phone within reach;with nursing/sitter in room Nurse Communication: Mobility status PT Visit Diagnosis: Other abnormalities of gait and mobility (R26.89)    Time: [id number] PT Time Calculation (min) (ACUTE ONLY): 37 min   Charges:   PT Evaluation $PT Eval Moderate Complexity: 1 Procedure PT Treatments $Gait Training: 8-22 mins   PT G Codes:        AmeryJennifer Ronnisha Felber, PT, DPT [phone]   Alessandra BevelsJennifer M Dwight Adamczak 08/14/2016, 5:08 PM

## 2016-08-14 NOTE — Progress Notes (Addendum)
PROGRESS NOTE   Carly Delacruz  ZOX:[id number]    DOB: 1965-09-04    DOA: 08/12/2016  PCP: Tommy Rainwater, MD   I have briefly reviewed patients previous medical records in [facility].  Brief Narrative:  52 y/o female, works as Diplomatic Services operational officer at [employer], lives alone and ambulates with a cane, PMH of chronic LBP, s/p fall in Dec 2017, follows with pain Mx (Dr. Alroy Bailiff), pelvic floor dysfunction, chronic dysuria, recurrent UTI's, obesity, OSA not tolerant of CPAP, HTN, presented with AMS (ambulatory but speech difficulty/unable to express herself and "spaced out" gaze. She has no recollection of events. No recent medication changes or overuse. Admitted for acute encephalopthy> possibly d/t polypharmacy and UTI. MS changes resolved. Dizziness on 4/15.   Assessment & Plan:   Principal Problem:   Acute metabolic encephalopathy Active Problems:   HTN (hypertension)   Abnormal urinalysis   Chronic back pain   Anemia   H/O bladder infections   Morbid obesity (HCC)   S/P gastric bypass   Polypharmacy   Acute lower UTI   1. Acute toxic metabolic encephalopathy: possibly d/t chronic polypharmacy complicated by UTI. CT & MRI Head without acute findings. Medication doses reduced. Treating UTI. EEG: Reported as essentially normal. No evidence of seizures. MS changes have resolved. Acetaminophen and Tylenol levels normal. Alcohol level: normal. UDS: Benzodiazepines + (on at home). Procalcitonin neg. 2. UTI: continue Ceftriaxone. UCx suggests contamination and not helpful. Day 2/3 ceftriaxone. 3. Dizziness: Reported on 6/15. Better. Orthostatic blood pressure checks negative. PT evaluation pending. No vertigo reported. Unclear etiology.? Medication effect. No focal deficits. 4. HTN: Continue home meds. Controlled this morning. Blood pressure 88/55 documented recently, discussed with nursing and to repeat. 5. Chronic LBP: Home meds reduced as per H&P. No pain reported. Titrate  back up as tolerated. Sees pain Mx as OP. 6. Hypokalemia: replace and follow. Mg normal. 7. Morbid Obesity, s/p Gastric Bypass: follows with Dr. Daphine Deutscher, Bariatric Surgery 8. Pelvic floor dysfunction: States that she uses when necessary vaginal Valium. 9. Abdominal pain: Unclear etiology. No abnormal findings on exam.? Related to UTI. Having BM.   DVT prophylaxis: Lovenox Code Status: Full Family Communication: Discussed with sister at bedside. Disposition: DC home when medically improved, possibly 6/17..   Consultants:  None  Procedures:  None  Antimicrobials:  Ceftriaxone   Subjective: Reported dizziness on 6/15. Noted when she was ambulating from bed to bathroom, felt the room "tilted to one side" but denies vertigo. When she sat on the commode, dizzy, felt "hot & cold" but no presyncope or syncope. This happened one more time later in the day. None since. This morning just "not feeling well". Reports midabdominal pain, not usual for her. Had a loose stool. Abdominal pain apparently worse with eating. No heartburn. Chronic dysuria. Right-sided headache-has at home intermittently.  ROS: As per RN, ambulated 100 feet with PT were still evaluating her.  Objective:  Vitals:   08/13/16 2150 08/14/16 0513 08/14/16 0813 08/14/16 1426  BP: 120/67 128/69 122/70 (!) 88/55  Pulse: 74 75  (!) 104  Resp: 18 18  17   Temp: 98.7 F (37.1 C) 98.6 F (37 C)  98.6 F (37 C)  TempSrc:      SpO2: 100% 100%    Weight:      Height:        Examination:  General exam: Pleasant middle aged female, sitting up comfortably in bed. Respiratory system: Clear to auscultation. Respiratory effort normal. Cardiovascular system: S1 &  S2 heard, RRR. No JVD, murmurs, rubs, gallops or clicks. No pedal edema. Telemetry personally reviewed: Sinus rhythm. Gastrointestinal system: Abdomen is nondistended, soft and nontender. Normal bowel sounds heard. Central nervous system: Alert and oriented. No focal  neurological deficits. Extremities: Symmetric 5 x 5 power. Skin: No rashes, lesions or ulcers Psychiatry: Judgement and insight appear normal. Mood & affect appears down today.     Data Reviewed: I have personally reviewed following labs and imaging studies  CBC:  Recent Labs Lab 08/12/16 1209 08/13/16 0520  WBC 7.5 6.0  NEUTROABS 5.5  --   HGB 14.0 12.2  HCT 44.7 38.4  MCV 87.3 86.9  PLT 315 277   Basic Metabolic Panel:  Recent Labs Lab 08/12/16 1209 08/12/16 1904 08/13/16 0520 08/14/16 0442  NA 137  --  141 138  K 3.2*  --  3.3* 3.3*  CL 102  --  105 109  CO2 24  --  25 24  GLUCOSE 104*  --  95 95  BUN 14  --  13 9  CREATININE 1.00  --  0.83 0.83  CALCIUM 9.3  --  8.4* 8.1*  MG  --  1.8  --   --   PHOS  --  3.2  --   --    Liver Function Tests:  Recent Labs Lab 08/12/16 1209 08/13/16 0520  AST 19 16  ALT 18 13*  ALKPHOS 71 63  BILITOT 0.8 0.6  PROT 7.8 6.3*  ALBUMIN 4.4 3.4*   Coagulation Profile:  Recent Labs Lab 08/12/16 1220  INR 1.02   Cardiac Enzymes:  Recent Labs Lab 08/12/16 1642  CKTOTAL 85     Recent Results (from the past 240 hour(s))  Urine Culture     Status: Abnormal   Collection Time: 08/12/16  1:01 PM  Result Value Ref Range Status   Specimen Description URINE, RANDOM  Final   Special Requests NONE  Final   Culture MULTIPLE SPECIES PRESENT, SUGGEST RECOLLECTION (A)  Final   Report Status 08/13/2016 FINAL  Final  Culture, blood (Routine X 2) w Reflex to ID Panel     Status: None (Preliminary result)   Collection Time: 08/12/16  4:40 PM  Result Value Ref Range Status   Specimen Description BLOOD LEFT ANTECUBITAL  Final   Special Requests   Final    BOTTLES DRAWN AEROBIC AND ANAEROBIC Blood Culture adequate volume   Culture NO GROWTH 2 DAYS  Final   Report Status PENDING  Incomplete  Culture, blood (Routine X 2) w Reflex to ID Panel     Status: None (Preliminary result)   Collection Time: 08/12/16  4:40 PM  Result  Value Ref Range Status   Specimen Description BLOOD BLOOD RIGHT FOREARM  Final   Special Requests IN PEDIATRIC BOTTLE Blood Culture adequate volume  Final   Culture NO GROWTH 2 DAYS  Final   Report Status PENDING  Incomplete         Radiology Studies: No results found.      Scheduled Meds: . amLODipine  10 mg Oral Daily   And  . irbesartan  150 mg Oral Daily  . buPROPion  150 mg Oral Daily  . enoxaparin (LOVENOX) injection  40 mg Subcutaneous Q24H  . famotidine  20 mg Oral BID  . sodium chloride flush  3 mL Intravenous Q12H  . tiZANidine  4 mg Oral QHS  . traZODone  100 mg Oral QHS   Continuous Infusions: . cefTRIAXone (ROCEPHIN)  IV Stopped (08/13/16 1803)     LOS: 1 day     Vaneza Pickart, MD, FACP, FHM. [facility] Pager [phone]  If 7PM-7AM, please contact night-coverage [email or web address] Password [email or web address] 08/14/2016, 3:51 PM

## 2016-08-15 LAB — BASIC METABOLIC PANEL
Anion gap: 8 (ref 5–15)
BUN: 7 mg/dL (ref 6–20)
CALCIUM: 8.9 mg/dL (ref 8.9–10.3)
CO2: 24 mmol/L (ref 22–32)
CREATININE: 0.81 mg/dL (ref 0.44–1.00)
Chloride: 106 mmol/L (ref 101–111)
GFR calc non Af Amer: >60 mL/min (ref >60)
Glucose, Bld: 111 mg/dL — ABNORMAL HIGH (ref 65–99)
Potassium: 3.5 mmol/L (ref 3.5–5.1)
SODIUM: 138 mmol/L (ref 135–145)

## 2016-08-15 MED ORDER — OXYBUTYNIN CHLORIDE 5 MG PO TABS
5.0000 mg | ORAL_TABLET | Freq: Three times a day (TID) | ORAL | Status: DC
  Administered 2016-08-15 – 2016-08-16 (×2): 5 mg via ORAL
  Filled 2016-08-15 (×2): qty 1

## 2016-08-15 MED ORDER — DICYCLOMINE HCL 10 MG PO CAPS
10.0000 mg | ORAL_CAPSULE | Freq: Three times a day (TID) | ORAL | Status: DC | PRN
  Administered 2016-08-15: 10 mg via ORAL
  Filled 2016-08-15: qty 1

## 2016-08-15 NOTE — Progress Notes (Signed)
PROGRESS NOTE    Carly Delacruz  ZOX:[id number] DOB: 1966-02-24 DOA: 08/12/2016 PCP: Tommy Rainwater, MD     Brief Narrative:  51 year old female presented with altered mental status. Patient is known to have obesity, chronic low back pain, hypertension, recurrent urinary tract infections and osteoarthritis. Patient was found with altered mentation, intermittently a phasic. Physical examination blood pressure 138/88, heart rate 105, temperature 99, respiratory 24, oxygen saturation 99%.She was confused, dry mucous membranes, lungs clear to auscultation bilaterally, heart S1-S2 present rhythmic, abdomen soft nontender, lower extremity is no edema. Sodium 137, potassium 3.2, chloride 102, bicarbonate 24, glucose 104, BN 14, creatinine 1.0, white count 7.5, hemoglobin 14.0, hematocrit 44.7, platelets 315 urinalysis 6-30 white cells, urine drug screen positive for benzodiazepines, CT head negative for acute intracranial abnormalities EKG was sinus rhythm. Patient admitted with to the hospital with working diagnosis of metabolic/toxic encephalopathy, possible urinary tract infection. Patient placed on antibiotics, clinically improving.   Assessment & Plan:   Principal Problem:   Acute metabolic encephalopathy Active Problems:   HTN (hypertension)   Abnormal urinalysis   Chronic back pain   Anemia   H/O bladder infections   Morbid obesity (HCC)   S/P gastric bypass   Polypharmacy   Acute lower UTI   1. Urine tract infection, present on admission. Cultures have been no growth, will continue antibiotic therapy with IV ceftriaxone, patient has developed persistent colicky in the lower abdominal region, will start patient on bentyl and follow response, possible colonic irritation from bladder, if no response may try oxybutinin.   2. Metabolic encephalopathy. Clinically more awake and alert, not confusion or agitation, will continue supportive medical care, treatment for urine  infection.   3. HTN. Will continue blood pressure control with amlodipine and irbersartan, holding IV fluids.   4, Depression. Will continue diazepam, tizanidine, and trazodone. Neuro checks per unit protocol, out of bed as tolerated.   DVT prophylaxis: enoxaparin  Code Status: full  Family Communication:  Disposition Plan: Home    Consultants:     Procedures:    Antimicrobials:   ceftriaxone    Subjective: Patient with persistent moderate to severe abdominal pain, lower quadrant, no improving or worsening factors, no radiation.   Objective: Vitals:   08/15/16 1018 08/15/16 1019 08/15/16 1102 08/15/16 1105  BP: 111/62 120/69 101/76 (!) 108/55  Pulse: 87 87 98 97  Resp: 18 18    Temp: 98.8 F (37.1 C)     TempSrc:      SpO2: 100% 99% 100% 100%  Weight:      Height:        Intake/Output Summary (Last 24 hours) at 08/15/16 1311 Last data filed at 08/15/16 0600  Gross per 24 hour  Intake              290 ml  Output                0 ml  Net              290 ml   Filed Weights   08/12/16 1734  Weight: 96.8 kg (213 lb 8 oz)    Examination:  General exam: deconditioned E ENT. No pallor or ictersus Respiratory system: Clear to auscultation. Respiratory effort normal. No wheezing, no rales or rhonchi.  Cardiovascular system: S1 & S2 heard, RRR. No JVD, murmurs, rubs, gallops or clicks. No pedal edema. Gastrointestinal system: Abdomen is nondistended, soft, but tender to deep palpation . No organomegaly or masses felt.  Normal bowel sounds heard. Central nervous system: Alert and oriented. No focal neurological deficits. Extremities: Symmetric 5 x 5 power. Skin: No rashes, lesions or ulcers     Data Reviewed: I have personally reviewed following labs and imaging studies  CBC:  Recent Labs Lab 08/12/16 1209 08/13/16 0520  WBC 7.5 6.0  NEUTROABS 5.5  --   HGB 14.0 12.2  HCT 44.7 38.4  MCV 87.3 86.9  PLT 315 277   Basic Metabolic Panel:  Recent  Labs Lab 08/12/16 1209 08/12/16 1904 08/13/16 0520 08/14/16 0442 08/15/16 0746  NA 137  --  141 138 138  K 3.2*  --  3.3* 3.3* 3.5  CL 102  --  105 109 106  CO2 24  --  25 24 24   GLUCOSE 104*  --  95 95 111*  BUN 14  --  13 9 7   CREATININE 1.00  --  0.83 0.83 0.81  CALCIUM 9.3  --  8.4* 8.1* 8.9  MG  --  1.8  --   --   --   PHOS  --  3.2  --   --   --    GFR: Estimated Creatinine Clearance: 84.6 mL/min (by C-G formula based on SCr of 0.81 mg/dL). Liver Function Tests:  Recent Labs Lab 08/12/16 1209 08/13/16 0520  AST 19 16  ALT 18 13*  ALKPHOS 71 63  BILITOT 0.8 0.6  PROT 7.8 6.3*  ALBUMIN 4.4 3.4*   No results for input(s): LIPASE, AMYLASE in the last 168 hours.  Recent Labs Lab 08/12/16 1220  AMMONIA 13   Coagulation Profile:  Recent Labs Lab 08/12/16 1220  INR 1.02   Cardiac Enzymes:  Recent Labs Lab 08/12/16 1642  CKTOTAL 85   BNP (last 3 results) No results for input(s): PROBNP in the last 8760 hours. HbA1C: No results for input(s): HGBA1C in the last 72 hours. CBG: No results for input(s): GLUCAP in the last 168 hours. Lipid Profile: No results for input(s): CHOL, HDL, LDLCALC, TRIG, CHOLHDL, LDLDIRECT in the last 72 hours. Thyroid Function Tests: No results for input(s): TSH, T4TOTAL, FREET4, T3FREE, THYROIDAB in the last 72 hours. Anemia Panel: No results for input(s): VITAMINB12, FOLATE, FERRITIN, TIBC, IRON, RETICCTPCT in the last 72 hours. Sepsis Labs:  Recent Labs Lab 08/12/16 1550 08/12/16 1642 08/14/16 0442  PROCALCITON  --  <0.10 <0.10  LATICACIDVEN 1.46  --   --     Recent Results (from the past 240 hour(s))  Urine Culture     Status: Abnormal   Collection Time: 08/12/16  1:01 PM  Result Value Ref Range Status   Specimen Description URINE, RANDOM  Final   Special Requests NONE  Final   Culture MULTIPLE SPECIES PRESENT, SUGGEST RECOLLECTION (A)  Final   Report Status 08/13/2016 FINAL  Final  Culture, blood (Routine X  2) w Reflex to ID Panel     Status: None (Preliminary result)   Collection Time: 08/12/16  4:40 PM  Result Value Ref Range Status   Specimen Description BLOOD LEFT ANTECUBITAL  Final   Special Requests   Final    BOTTLES DRAWN AEROBIC AND ANAEROBIC Blood Culture adequate volume   Culture NO GROWTH 3 DAYS  Final   Report Status PENDING  Incomplete  Culture, blood (Routine X 2) w Reflex to ID Panel     Status: None (Preliminary result)   Collection Time: 08/12/16  4:40 PM  Result Value Ref Range Status   Specimen Description BLOOD BLOOD  RIGHT FOREARM  Final   Special Requests IN PEDIATRIC BOTTLE Blood Culture adequate volume  Final   Culture NO GROWTH 3 DAYS  Final   Report Status PENDING  Incomplete         Radiology Studies: No results found.      Scheduled Meds: . amLODipine  10 mg Oral Daily   And  . irbesartan  150 mg Oral Daily  . buPROPion  150 mg Oral Daily  . enoxaparin (LOVENOX) injection  40 mg Subcutaneous Q24H  . famotidine  20 mg Oral BID  . sodium chloride flush  3 mL Intravenous Q12H  . tiZANidine  4 mg Oral QHS  . traZODone  100 mg Oral QHS   Continuous Infusions: . cefTRIAXone (ROCEPHIN)  IV Stopped (08/14/16 1732)     LOS: 2 days        Dywane Peruski Annett Gula, MD [facility] Pager [phone]  If 7PM-7AM, please contact night-coverage [email or web address] Password [email or web address] 08/15/2016, 1:11 PM

## 2016-08-16 DIAGNOSIS — E876 Hypokalemia: Secondary | ICD-10-CM

## 2016-08-16 DIAGNOSIS — Z9884 Bariatric surgery status: Secondary | ICD-10-CM

## 2016-08-16 LAB — CBC WITH DIFFERENTIAL/PLATELET
Basophils Absolute: 0 10*3/uL (ref 0.0–0.1)
Basophils Relative: 0 %
EOS ABS: 0.1 10*3/uL (ref 0.0–0.7)
EOS PCT: 1 %
HCT: 37 % (ref 36.0–46.0)
Hemoglobin: 11.5 g/dL — ABNORMAL LOW (ref 12.0–15.0)
LYMPHS ABS: 2.3 10*3/uL (ref 0.7–4.0)
Lymphocytes Relative: 36 %
MCH: 27.1 pg (ref 26.0–34.0)
MCHC: 31.1 g/dL (ref 30.0–36.0)
MCV: 87.3 fL (ref 78.0–100.0)
Monocytes Absolute: 0.4 10*3/uL (ref 0.1–1.0)
Monocytes Relative: 6 %
Neutro Abs: 3.5 10*3/uL (ref 1.7–7.7)
Neutrophils Relative %: 57 %
PLATELETS: 279 10*3/uL (ref 150–400)
RBC: 4.24 MIL/uL (ref 3.87–5.11)
RDW: 14.3 % (ref 11.5–15.5)
WBC: 6.2 10*3/uL (ref 4.0–10.5)

## 2016-08-16 LAB — BASIC METABOLIC PANEL
Anion gap: 8 (ref 5–15)
BUN: 7 mg/dL (ref 6–20)
CHLORIDE: 105 mmol/L (ref 101–111)
CO2: 27 mmol/L (ref 22–32)
Calcium: 8.5 mg/dL — ABNORMAL LOW (ref 8.9–10.3)
Creatinine, Ser: 0.78 mg/dL (ref 0.44–1.00)
GFR calc Af Amer: >60 mL/min (ref >60)
GFR calc non Af Amer: >60 mL/min (ref >60)
GLUCOSE: 92 mg/dL (ref 65–99)
Potassium: 3.4 mmol/L — ABNORMAL LOW (ref 3.5–5.1)
SODIUM: 140 mmol/L (ref 135–145)

## 2016-08-16 LAB — PROCALCITONIN: Procalcitonin: <0.1 ng/mL

## 2016-08-16 MED ORDER — FAMOTIDINE 20 MG PO TABS: 20.0000 mg | ORAL_TABLET | Freq: Two times a day (BID) | ORAL | 0 refills | Status: DC

## 2016-08-16 MED ORDER — TAPENTADOL HCL 50 MG PO TABS: 50.0000 mg | ORAL_TABLET | Freq: Two times a day (BID) | ORAL | Status: DC | PRN

## 2016-08-16 MED ORDER — TRAZODONE HCL 100 MG PO TABS: 100.0000 mg | ORAL_TABLET | Freq: Every day | ORAL | Status: DC

## 2016-08-16 MED ORDER — POTASSIUM CHLORIDE CRYS ER 20 MEQ PO TBCR
40.0000 meq | EXTENDED_RELEASE_TABLET | Freq: Once | ORAL | Status: AC
  Administered 2016-08-16: 40 meq via ORAL
  Filled 2016-08-16: qty 2

## 2016-08-16 MED ORDER — OXYBUTYNIN CHLORIDE 5 MG PO TABS: 5.0000 mg | ORAL_TABLET | Freq: Two times a day (BID) | ORAL | 0 refills | Status: DC

## 2016-08-16 MED ORDER — GABAPENTIN 300 MG PO CAPS: 300.0000 mg | ORAL_CAPSULE | Freq: Two times a day (BID) | ORAL | Status: DC

## 2016-08-16 MED ORDER — MELATONIN 5 MG PO TABS: 5.0000 mg | ORAL_TABLET | Freq: Every evening | ORAL | Status: DC | PRN

## 2016-08-16 MED FILL — OXYBUTYNIN 5 MG TABLET: 5 | 15 days supply | Qty: 30 | Fill #0

## 2016-08-16 NOTE — Progress Notes (Signed)
Pt is complaining of increased "cramping" pain in RLQ after eating. RN notified.

## 2016-08-16 NOTE — Care Management Note (Signed)
Case Management Note  Patient Details  Name: Carly Delacruz MRN: [id number] Date of Birth: 08/11/1965  Subjective/Objective:    Acute metabolic encephalopathy, HTN, Chronic back pain               Action/Plan: Discharge Planning:  NCM spoke to pt and lives alone, She declines bedside commode. [facility] 3n1 bedside commode or tub bench.   PCP Lorenda IshiharaVARADARAJAN, RUPASHREE MD   Expected Discharge Date:  08/16/16               Expected Discharge Plan:  Home/Self Care  In-House Referral:  NA  Discharge planning Services  CM Consult  Post Acute Care Choice:  NA Choice offered to:  NA  DME Arranged:  N/A DME Agency:  NA  HH Arranged:  NA HH Agency:  NA  Status of Service:  Completed, signed off  If discussed at Long Length of Stay Meetings, dates discussed:    Additional Comments:  Elliot CousinShavis, Chinelo Benn Ellen, RN 08/16/2016, 3:04 PM

## 2016-08-16 NOTE — Discharge Summary (Signed)
Physician Discharge Summary  Carly Delacruz AVW:[id number] DOB: 10/05/1965  PCP: Carly IshiharaVaradarajan, Rupashree, MD  Admit date: 08/12/2016 Discharge date: 08/16/2016  Recommendations for Outpatient Follow-up:  1. Dr. Lorenda Ishiharaupashree Varadarajan, PCP In 4 days with repeat labs (CBC & BMP).  Home Health: None Equipment/Devices: 3n1    Discharge Condition: Improved and stable  CODE STATUS: Full  Diet recommendation: Heart healthy diet.  Discharge Diagnoses:  Principal Problem:   Acute metabolic encephalopathy Active Problems:   HTN (hypertension)   Abnormal urinalysis   Chronic back pain   Anemia   H/O bladder infections   Morbid obesity (HCC)   S/P gastric bypass   Polypharmacy   Acute lower UTI   Brief Summary: 51 y/o female, works as Diplomatic Services operational officersecretary at [employer], lives alone and ambulates with a cane, PMH of chronic LBP, s/p fall in Dec 2017, follows with pain Mx (Dr. Alroy BailiffBodeaux), pelvic floor dysfunction, chronic dysuria, recurrent UTI's, obesity, OSA not tolerant of CPAP, HTN, presented with AMS (ambulatory but speech difficulty/unable to express herself and "spaced out" gaze. She has no recollection of events. No recent medication changes or overuse. Admitted for acute encephalopthy> possibly d/t polypharmacy and UTI. MS changes resolved.    Assessment & Plan:   1. Acute toxic metabolic encephalopathy: possibly d/t chronic polypharmacy complicated by UTI. CT & MRI Head without acute findings. Medication doses reduced as below. Treated UTI. EEG: Reported as essentially normal. No evidence of seizures. MS changes have resolved. Acetaminophen and Tylenol levels normal. Alcohol level: normal. UDS: Benzodiazepines + (on at home). Procalcitonin neg. 2. UTI: UCx suggests contamination and not helpful. Treated with 4 days of IV ceftriaxone. No antibiotics at discharge. 3. Dizziness: Reported on 6/15. Orthostatic blood pressure checks negative. No vertigo reported. Unclear etiology.? Medication  effect. No focal deficits. Resolved. PT recommends no follow-up. 4. HTN: Continue home meds. Controlled. 5. Chronic LBP: Home meds reduced as below. No pain reported. Sees pain Mx as OP. Counseled patient that she should not overuse her medications and she verbalized understanding. 6. Hypokalemia:  replaced prior to discharge. Mg normal. Follow BMP as outpatient. 7. Morbid Obesity, s/p Gastric Bypass: follows with Dr. Daphine DeutscherMartin, Bariatric Surgery. Patient reported some abdominal cramping after eating over the last 2 days. Unclear etiology. Increased dose of Pepcid. Outpatient follow-up with PCP and or bariatric surgeon. 8. Pelvic floor dysfunction: States that she uses when necessary vaginal Valium. Abdominal symptoms also felt due to bladder dysfunction and oxybutynin added with some improvement. 9. Anemia:? Dilutional. Follow CBC in a few days as outpatient. No bleeding reported.     Consultants:  None  Procedures:  None   Discharge Instructions  Discharge Instructions    Call MD for:    Complete by:  As directed    Recurrent confusion.   Call MD for:  extreme fatigue    Complete by:  As directed    Call MD for:  persistant dizziness or light-headedness    Complete by:  As directed    Call MD for:  persistant nausea and vomiting    Complete by:  As directed    Call MD for:  severe uncontrolled pain    Complete by:  As directed    Call MD for:  temperature >100.4    Complete by:  As directed    Diet - low sodium heart healthy    Complete by:  As directed    Increase activity slowly    Complete by:  As directed  Medication List    TAKE these medications   amLODipine 5 MG tablet Commonly known as:  NORVASC Take 5 mg by mouth daily.   CALCIUM PO Take 1 tablet by mouth every morning.   diazepam 5 MG tablet Commonly known as:  VALIUM Place 5 mg vaginally every 8 (eight) hours as needed (for pelvic floor spasms).   diphenhydrAMINE 25 MG tablet Commonly known  as:  BENADRYL Take 25 mg by mouth every 6 (six) hours as needed for itching or allergies (itching).   ergocalciferol 50000 units capsule Commonly known as:  VITAMIN D2 Take 50,000 Units by mouth once a week.   famotidine 20 MG tablet Commonly known as:  PEPCID Take 1 tablet (20 mg total) by mouth 2 (two) times daily. What changed:  when to take this   gabapentin 300 MG capsule Commonly known as:  NEURONTIN Take 1 capsule (300 mg total) by mouth 2 (two) times daily. What changed:  how much to take  when to take this  additional instructions   ibuprofen 600 MG tablet Commonly known as:  ADVIL,MOTRIN Take 600 mg by mouth every 8 (eight) hours as needed for headache or moderate pain.   Melatonin 5 MG Tabs Take 1 tablet (5 mg total) by mouth at bedtime as needed (Sleep). What changed:  when to take this  reasons to take this   oxybutynin 5 MG tablet Commonly known as:  DITROPAN Take 1 tablet (5 mg total) by mouth 2 (two) times daily.   tapentadol 50 MG tablet Commonly known as:  NUCYNTA Take 1 tablet (50 mg total) by mouth every 12 (twelve) hours as needed for moderate pain. What changed:  when to take this   tiZANidine 4 MG tablet Commonly known as:  ZANAFLEX Take 4 mg by mouth 3 (three) times daily as needed for muscle spasms.   traZODone 100 MG tablet Commonly known as:  DESYREL Take 1 tablet (100 mg total) by mouth at bedtime. What changed:  how much to take      Follow-up Information    Carly Ishihara, MD. Schedule an appointment as soon as possible for a visit in 4 day(s).   Specialty:  Internal Medicine Why:  To be seen with repeat labs (CBC & BMP). Contact information: [address] [place] [address] [phone]          Allergies  Allergen Reactions  . Morphine And Related Shortness Of Breath and Itching  . Septra [Sulfamethoxazole-Trimethoprim] Anaphylaxis and Hives  . Sulfa Antibiotics Anaphylaxis, Hives and  Swelling    Throat swelling  . Sulfamethoxazole-Trimethoprim Anaphylaxis and Hives  . Sulfasalazine Anaphylaxis, Hives, Swelling and Other (See Comments)    Throat swelling  . Ultram [Tramadol] Shortness Of Breath, Anxiety and Other (See Comments)    "panic attacks"   . Ciprofloxacin Other (See Comments)    This medication was given along with a sulfa. Reaction is unknown at this time. Must list due to possible allergy.  Leodis [place] [Propoxyphene N-Acetaminophen] Itching  . Adhesive [Tape] Rash  . Codeine Itching and Other (See Comments)    No change when benadryl is given  . Demerol [Meperidine] Nausea Only  . Hydromorphone Itching and Other (See Comments)    No change when benadryl is given  . Losartan Palpitations      Procedures/Studies: Ct Head Wo Contrast  Result Date: 08/12/2016 CLINICAL DATA:  Confusion EXAM: CT HEAD WITHOUT CONTRAST TECHNIQUE: Contiguous axial images were obtained from the base  of the skull through the vertex without intravenous contrast. COMPARISON:  06/14/2014 FINDINGS: Brain: No evidence of acute infarction, hemorrhage, hydrocephalus, extra-axial collection or mass lesion/mass effect. Vascular: Negative Skull: No acute or aggressive finding Sinuses/Orbits: Negative IMPRESSION: Negative head CT. Electronically Signed   By: Marnee Spring M.D.   On: 08/12/2016 12:56   Mr Brain Wo Contrast  Result Date: 08/12/2016 CLINICAL DATA:  Aphasia.  Confusion. EXAM: MRI HEAD WITHOUT CONTRAST TECHNIQUE: Multiplanar, multiecho pulse sequences of the brain and surrounding structures were obtained without intravenous contrast. COMPARISON:  Head CT 08/12/2016 and MRI 06/14/2014 FINDINGS: Brain: There is no evidence of acute infarct, intracranial hemorrhage, mass, midline shift, or extra-axial fluid collection. Incidental coarctation is again noted involving the right lateral ventricle frontal horn, likely congenital. There is no hydrocephalus. The brain is normal in signal.  Cerebral volume is within normal limits for age. Vascular: Major intracranial vascular flow voids are preserved. Skull and upper cervical spine: Unremarkable bone marrow signal. Sinuses/Orbits: Unremarkable orbits. Paranasal sinuses and mastoid air cells are clear. Other: None. IMPRESSION: Negative brain MRI. Electronically Signed   By: Sebastian Ache M.D.   On: 08/12/2016 15:27      Subjective: Seen this morning. "I'm ready to go". Reported improvement in her abdominal cramping. No nausea or vomiting. Having BMs. No dizziness, lightheadedness or vertigo reported.  Discharge Exam:  Vitals:   08/15/16 1549 08/15/16 2159 08/16/16 0540 08/16/16 0926  BP: (!) 105/59 126/63 114/67 (!) 149/65  Pulse: 78 73 70 77  Resp: 20 18 18 18   Temp: 98.7 F (37.1 C) 98.7 F (37.1 C) 99.1 F (37.3 C)   TempSrc:      SpO2: 100% 99% 97% 100%  Weight:      Height:        General exam: Pleasant middle aged female, sitting up comfortably in bed. Respiratory system: Clear to auscultation. Respiratory effort normal. Cardiovascular system: S1 & S2 heard, RRR. No JVD, murmurs, rubs, gallops or clicks. No pedal edema.  Gastrointestinal system: Abdomen is nondistended, soft and nontender. Normal bowel sounds heard. Central nervous system: Alert and oriented. No focal neurological deficits. Extremities: Symmetric 5 x 5 power. Skin: No rashes, lesions or ulcers Psychiatry: Judgement and insight appear normal. Mood & affect appropriate and pleasant.      The results of significant diagnostics from this hospitalization (including imaging, microbiology, ancillary and laboratory) are listed below for reference.     Microbiology: Recent Results (from the past 240 hour(s))  Urine Culture     Status: Abnormal   Collection Time: 08/12/16  1:01 PM  Result Value Ref Range Status   Specimen Description URINE, RANDOM  Final   Special Requests NONE  Final   Culture MULTIPLE SPECIES PRESENT, SUGGEST RECOLLECTION (A)   Final   Report Status 08/13/2016 FINAL  Final  Culture, blood (Routine X 2) w Reflex to ID Panel     Status: None (Preliminary result)   Collection Time: 08/12/16  4:40 PM  Result Value Ref Range Status   Specimen Description BLOOD LEFT ANTECUBITAL  Final   Special Requests   Final    BOTTLES DRAWN AEROBIC AND ANAEROBIC Blood Culture adequate volume   Culture NO GROWTH 3 DAYS  Final   Report Status PENDING  Incomplete  Culture, blood (Routine X 2) w Reflex to ID Panel     Status: None (Preliminary result)   Collection Time: 08/12/16  4:40 PM  Result Value Ref Range Status   Specimen Description BLOOD BLOOD  RIGHT FOREARM  Final   Special Requests IN PEDIATRIC BOTTLE Blood Culture adequate volume  Final   Culture NO GROWTH 3 DAYS  Final   Report Status PENDING  Incomplete     Labs: CBC:  Recent Labs Lab 08/12/16 1209 08/13/16 0520 08/16/16 0622  WBC 7.5 6.0 6.2  NEUTROABS 5.5  --  3.5  HGB 14.0 12.2 11.5*  HCT 44.7 38.4 37.0  MCV 87.3 86.9 87.3  PLT 315 277 279   Basic Metabolic Panel:  Recent Labs Lab 08/12/16 1209 08/12/16 1904 08/13/16 0520 08/14/16 0442 08/15/16 0746 08/16/16 0622  NA 137  --  141 138 138 140  K 3.2*  --  3.3* 3.3* 3.5 3.4*  CL 102  --  105 109 106 105  CO2 24  --  25 24 24 27   GLUCOSE 104*  --  95 95 111* 92  BUN 14  --  13 9 7 7   CREATININE 1.00  --  0.83 0.83 0.81 0.78  CALCIUM 9.3  --  8.4* 8.1* 8.9 8.5*  MG  --  1.8  --   --   --   --   PHOS  --  3.2  --   --   --   --    Liver Function Tests:  Recent Labs Lab 08/12/16 1209 08/13/16 0520  AST 19 16  ALT 18 13*  ALKPHOS 71 63  BILITOT 0.8 0.6  PROT 7.8 6.3*  ALBUMIN 4.4 3.4*   Cardiac Enzymes:  Recent Labs Lab 08/12/16 1642  CKTOTAL 85   Urinalysis    Component Value Date/Time   COLORURINE AMBER (A) 08/12/2016 1301   APPEARANCEUR CLOUDY (A) 08/12/2016 1301   LABSPEC 1.028 08/12/2016 1301   PHURINE 5.0 08/12/2016 1301   GLUCOSEU NEGATIVE 08/12/2016 1301   HGBUR  NEGATIVE 08/12/2016 1301   BILIRUBINUR NEGATIVE 08/12/2016 1301   KETONESUR 80 (A) 08/12/2016 1301   PROTEINUR 30 (A) 08/12/2016 1301   UROBILINOGEN 0.2 06/15/2014 0258   NITRITE NEGATIVE 08/12/2016 1301   LEUKOCYTESUR LARGE (A) 08/12/2016 1301      Time coordinating discharge: Over 30 minutes  SIGNED:  Marcellus Scott, MD, FACP, FHM. [facility] Pager [phone]  If 7PM-7AM, please contact night-coverage [email or web address] Password [facility] 08/16/2016, 2:24 PM

## 2016-08-17 LAB — CULTURE, BLOOD (ROUTINE X 2)
Culture: NO GROWTH
Culture: NO GROWTH
SPECIAL REQUESTS: ADEQUATE
SPECIAL REQUESTS: ADEQUATE

## 2016-08-17 MED FILL — NUCYNTA 50 MG TABLET: 50 | 30 days supply | Qty: 120 | Fill #0

## 2016-08-18 MED FILL — FLUCONAZOLE 150 MG TABLET: 150 | 14 days supply | Qty: 2 | Fill #0

## 2016-08-18 NOTE — ED Provider Notes (Signed)
[facility]-EMERGENCY DEPT Provider Note   CSN: [id number] Arrival date & time: 08/12/16  1143     History   Chief Complaint Chief Complaint  Patient presents with  . Altered Mental Status    HPI Carly Delacruz is a 51 y.o. female. CC: change in behavior/mentalstatus.  HPI          HPI: Carly Pihamela J Counsell is a 51 y.o. female with medical history significant for obesity, chronic low back pain, sleep apnea not tolerant to CPAP, hypertension, osteoarthritis, history of recurrent UTI.  She has not shown up for work or PT appointment for the last 2 days. Family did wellness check at her home. Pt "confused and spacy".  Brought to ER. Pt with stuttering aphasia--doesn't finish sentences and seems frustrated with attempt at conversation.  She denies new meds/med changes.   Past Medical History:  Diagnosis Date  . Allergy   . Anemia   . Arthritis 12/10/03  . Atrophic vaginitis 2009  . BV (bacterial vaginosis) 2006  . Chronic back pain   . Chronic LLQ pain   . Complication of anesthesia   . Elevated LFTs   . Endometriosis   . Fatigue 2012  . Gallstones   . GERD (gastroesophageal reflux disease)   . H/O bladder infections   . H/O mumps   . H/O varicella   . History of candidal vulvovaginitis 03/21/03  . HTN (hypertension)   . Insomnia   . Insomnia secondary to situational depression 06/06/07  . Liver mass, right lobe 08/13/04   Nodule  . Malaise 2012  . Morbid obesity (HCC) 2005  . Osteoporosis   . S/P gastric bypass   . Sleep apnea 2005  . SUI (stress urinary incontinence, female) 08/12/03  . Trichomonas   . Vulvar lesion 03/21/03  . Yeast infection     Patient Active Problem List   Diagnosis Date Noted  . Acute lower UTI   . Acute metabolic encephalopathy 08/12/2016  . HTN (hypertension) 08/12/2016  . Abnormal urinalysis 08/12/2016  . Chronic back pain 08/12/2016  . Anemia 08/12/2016  . H/O bladder infections 08/12/2016  . S/P gastric bypass 08/12/2016  .  Polypharmacy   . Pain in left ankle and joints of left foot 03/08/2016  . Sleep-disordered breathing 07/26/2014  . GERD (gastroesophageal reflux disease) 06/15/2014  . Neuropathy 06/15/2014  . Headache 06/15/2014  . Essential hypertension 06/15/2014  . Hyperkalemia 06/14/2014  . Dizziness 06/14/2014  . History of gastric bypass 05/08/2013  . Obesity 05/08/2013  . Back pain 04/26/2012  . Morbid obesity (HCC) 03/02/2003    Past Surgical History:  Procedure Laterality Date  . ABDOMINAL HYSTERECTOMY  2001  . APPENDECTOMY  2001  . CHOLECYSTECTOMY  2011  . DILATION AND CURETTAGE OF UTERUS    . GASTRIC BYPASS  2007   [facility], Dr. Daphine DeutscherMartin  . KNEE ARTHROSCOPY Left   . LAPAROTOMY    . TONSILLECTOMY    . WISDOM TOOTH EXTRACTION      OB History    Gravida Para Term Preterm AB Living   0 0           SAB TAB Ectopic Multiple Live Births                   Home Medications    Prior to Admission medications   Medication Sig Start Date End Date Taking? Authorizing Provider  amLODipine (NORVASC) 5 MG tablet Take 5 mg by mouth daily.   Yes [provider]  CALCIUM PO Take 1 tablet by mouth every morning.    Yes [provider]  diazepam (VALIUM) 5 MG tablet Place 5 mg vaginally every 8 (eight) hours as needed (for pelvic floor spasms).    Yes [provider]  diphenhydrAMINE (BENADRYL) 25 MG tablet Take 25 mg by mouth every 6 (six) hours as needed for itching or allergies (itching).    Yes [provider]  ergocalciferol (VITAMIN D2) 50000 units capsule Take 50,000 Units by mouth once a week.   Yes [provider]  ibuprofen (ADVIL,MOTRIN) 600 MG tablet Take 600 mg by mouth every 8 (eight) hours as needed for headache or moderate pain.   Yes [provider]  tiZANidine (ZANAFLEX) 4 MG tablet Take 4 mg by mouth 3 (three) times daily as needed for muscle spasms.    Yes [provider]  famotidine (PEPCID) 20 MG  tablet Take 1 tablet (20 mg total) by mouth 2 (two) times daily. 08/16/16   Hongalgi, Maximino Greenland, MD  gabapentin (NEURONTIN) 300 MG capsule Take 1 capsule (300 mg total) by mouth 2 (two) times daily. 08/16/16   Hongalgi, Maximino Greenland, MD  Melatonin 5 MG TABS Take 1 tablet (5 mg total) by mouth at bedtime as needed (Sleep). 08/16/16   Hongalgi, Maximino Greenland, MD  oxybutynin (DITROPAN) 5 MG tablet Take 1 tablet (5 mg total) by mouth 2 (two) times daily. 08/16/16   Hongalgi, Maximino Greenland, MD  tapentadol (NUCYNTA) 50 MG tablet Take 1 tablet (50 mg total) by mouth every 12 (twelve) hours as needed for moderate pain. 08/16/16   Hongalgi, Maximino Greenland, MD  traZODone (DESYREL) 100 MG tablet Take 1 tablet (100 mg total) by mouth at bedtime. 08/16/16   Hongalgi, Maximino Greenland, MD    Family History Family History  Problem Relation Age of Onset  . Diabetes Paternal Grandmother   . Leukemia Maternal Grandmother   . Arthritis Mother   . Prostate cancer Father   . Colitis Sister     Social History Social History  Substance Use Topics  . Smoking status: Never Smoker  . Smokeless tobacco: Never Used  . Alcohol use No     Allergies   Morphine and related; Septra [sulfamethoxazole-trimethoprim]; Sulfa antibiotics; Sulfamethoxazole-trimethoprim; Sulfasalazine; Ultram [tramadol]; Ciprofloxacin; Darvocet [propoxyphene n-acetaminophen]; Adhesive [tape]; Codeine; Demerol [meperidine]; Hydromorphone; and Losartan   Review of Systems Review of Systems  Constitutional: Negative for appetite change, chills, diaphoresis, fatigue and fever.  HENT: Negative for mouth sores, sore throat and trouble swallowing.   Eyes: Negative for visual disturbance.  Respiratory: Negative for cough, chest tightness, shortness of breath and wheezing.   Cardiovascular: Negative for chest pain.  Gastrointestinal: Negative for abdominal distention, abdominal pain, diarrhea, nausea and vomiting.  Endocrine: Negative for polydipsia, polyphagia and polyuria.    Genitourinary: Negative for dysuria, frequency and hematuria.  Musculoskeletal: Negative for gait problem.  Skin: Negative for color change, pallor and rash.  Neurological: Positive for speech difficulty. Negative for dizziness, syncope, light-headedness and headaches.  Hematological: Does not bruise/bleed easily.  Psychiatric/Behavioral: Positive for confusion. Negative for behavioral problems.     Physical Exam Updated Vital Signs BP (!) 149/65 (BP Location: Right Arm)   Pulse 77   Temp 99.1 F (37.3 C)   Resp 18   Ht 5' 0.5" (1.537 m)   Wt 96.8 kg (213 lb 8 oz)   SpO2 100%   BMI 41.01 kg/m   Physical Exam  Constitutional: She appears well-developed and well-nourished.  No distress.  HENT:  Head: Normocephalic.  Eyes: Conjunctivae are normal. Pupils are equal, round, and reactive to light. No scleral icterus.  Neck: Normal range of motion. Neck supple. No thyromegaly present.  Cardiovascular: Normal rate and regular rhythm.  Exam reveals no gallop and no friction rub.   No murmur heard. Pulmonary/Chest: Effort normal and breath sounds normal. No respiratory distress. She has no wheezes. She has no rales.  Abdominal: Soft. Bowel sounds are normal. She exhibits no distension. There is no tenderness. There is no rebound.  Musculoskeletal: Normal range of motion.  Neurological:  Slow verbal responses. Normal level of consciousness. Icomplete aphasia in that pt cannot finish a sentence--but this is not constant.  Skin: Skin is warm and dry. No rash noted.  Psychiatric: She has a normal mood and affect. Her behavior is normal.     ED Treatments / Results  Labs (all labs ordered are listed, but only abnormal results are displayed) Labs Reviewed  URINE CULTURE - Abnormal; Notable for the following:       Result Value   Culture MULTIPLE SPECIES PRESENT, SUGGEST RECOLLECTION (*)    All other components within normal limits  COMPREHENSIVE METABOLIC PANEL - Abnormal; Notable  for the following:    Potassium 3.2 (*)    Glucose, Bld 104 (*)    All other components within normal limits  CBC WITH DIFFERENTIAL/PLATELET - Abnormal; Notable for the following:    RBC 5.12 (*)    All other components within normal limits  RAPID URINE DRUG SCREEN, HOSP PERFORMED - Abnormal; Notable for the following:    Benzodiazepines POSITIVE (*)    All other components within normal limits  URINALYSIS, ROUTINE W REFLEX MICROSCOPIC - Abnormal; Notable for the following:    Color, Urine AMBER (*)    APPearance CLOUDY (*)    Ketones, ur 80 (*)    Protein, ur 30 (*)    Leukocytes, UA LARGE (*)    Bacteria, UA MANY (*)    Squamous Epithelial / LPF 6-30 (*)    All other components within normal limits  ACETAMINOPHEN LEVEL - Abnormal; Notable for the following:    Acetaminophen (Tylenol), Serum <10 (*)    All other components within normal limits  COMPREHENSIVE METABOLIC PANEL - Abnormal; Notable for the following:    Potassium 3.3 (*)    Calcium 8.4 (*)    Total Protein 6.3 (*)    Albumin 3.4 (*)    ALT 13 (*)    All other components within normal limits  BASIC METABOLIC PANEL - Abnormal; Notable for the following:    Potassium 3.3 (*)    Calcium 8.1 (*)    All other components within normal limits  BASIC METABOLIC PANEL - Abnormal; Notable for the following:    Glucose, Bld 111 (*)    All other components within normal limits  CBC WITH DIFFERENTIAL/PLATELET - Abnormal; Notable for the following:    Hemoglobin 11.5 (*)    All other components within normal limits  BASIC METABOLIC PANEL - Abnormal; Notable for the following:    Potassium 3.4 (*)    Calcium 8.5 (*)    All other components within normal limits  CULTURE, BLOOD (ROUTINE X 2)  CULTURE, BLOOD (ROUTINE X 2)  ETHANOL  PROTIME-INR  AMMONIA  PROCALCITONIN  CK  SALICYLATE LEVEL  HIV ANTIBODY (ROUTINE TESTING)  MAGNESIUM  PHOSPHORUS  CBC  PROCALCITONIN  PROCALCITONIN  I-STAT CG4 LACTIC ACID, ED    EKG  EKG Interpretation  Date/Time:  Thursday August 12 2016 12:18:39 EDT Ventricular Rate:  90 PR Interval:    QRS Duration: 104 QT Interval:  386 QTC Calculation: 473 R Axis:   66 Text Interpretation:  Sinus rhythm Borderline T abnormalities, anterior leads No significant change since 06/14/2014 Confirmed by Geoffery Lyons ([id number]) on 08/16/2016 5:35:53 PM       Radiology No results found.  Procedures Procedures (including critical care time)  Medications Ordered in ED Medications  cefTRIAXone (ROCEPHIN) 1 g in dextrose 5 % 50 mL IVPB (0 g Intravenous Stopped 08/12/16 2002)  potassium chloride SA (K-DUR,KLOR-CON) CR tablet 40 mEq (40 mEq Oral Given 08/13/16 2100)  potassium chloride (K-DUR,KLOR-CON) CR tablet 30 mEq (30 mEq Oral Given 08/14/16 1221)  potassium chloride SA (K-DUR,KLOR-CON) CR tablet 40 mEq (40 mEq Oral Given 08/16/16 1504)     Initial Impression / Assessment and Plan / ED Course  I have reviewed the triage vital signs and the nursing notes.  Pertinent labs & imaging results that were available during my care of the patient were reviewed by me and considered in my medical decision making (see chart for details).     +UA. Negative imaging. MRI pending. Will admit for further eval. UDS+ Benzos--pt prescribed.  Final Clinical Impressions(s) / ED Diagnoses   Final diagnoses:  Altered mental status, unspecified altered mental status type    New Prescriptions Discharge Medication List as of 08/16/2016  2:25 PM    START taking these medications   Details  oxybutynin (DITROPAN) 5 MG tablet Take 1 tablet (5 mg total) by mouth 2 (two) times daily., Starting Mon 08/16/2016, Normal         Rolland Porter, MD 08/18/16 1051

## 2016-08-20 DIAGNOSIS — E876 Hypokalemia: Secondary | ICD-10-CM | POA: Diagnosis not present

## 2016-08-20 DIAGNOSIS — G894 Chronic pain syndrome: Secondary | ICD-10-CM | POA: Diagnosis not present

## 2016-08-20 DIAGNOSIS — G9340 Encephalopathy, unspecified: Secondary | ICD-10-CM | POA: Diagnosis not present

## 2016-08-20 DIAGNOSIS — N3281 Overactive bladder: Secondary | ICD-10-CM | POA: Diagnosis not present

## 2016-08-20 DIAGNOSIS — K219 Gastro-esophageal reflux disease without esophagitis: Secondary | ICD-10-CM | POA: Diagnosis not present

## 2016-08-24 ENCOUNTER — Other Ambulatory Visit: Payer: Self-pay | Admitting: Internal Medicine

## 2016-08-24 ENCOUNTER — Ambulatory Visit
Admission: RE | Admit: 2016-08-24 | Discharge: 2016-08-24 | Disposition: A | Discharge status: Home / Self Care | Payer: 59 | Source: Ambulatory Visit | Attending: Internal Medicine | Admitting: Internal Medicine

## 2016-08-24 DIAGNOSIS — S99921A Unspecified injury of right foot, initial encounter: Secondary | ICD-10-CM | POA: Diagnosis not present

## 2016-08-24 DIAGNOSIS — M25571 Pain in right ankle and joints of right foot: Secondary | ICD-10-CM

## 2016-08-24 DIAGNOSIS — S92354A Nondisplaced fracture of fifth metatarsal bone, right foot, initial encounter for closed fracture: Secondary | ICD-10-CM | POA: Diagnosis not present

## 2016-08-24 DIAGNOSIS — S92351A Displaced fracture of fifth metatarsal bone, right foot, initial encounter for closed fracture: Secondary | ICD-10-CM | POA: Diagnosis not present

## 2016-08-24 DIAGNOSIS — M85859 Other specified disorders of bone density and structure, unspecified thigh: Secondary | ICD-10-CM | POA: Diagnosis not present

## 2016-08-25 DIAGNOSIS — S92354A Nondisplaced fracture of fifth metatarsal bone, right foot, initial encounter for closed fracture: Secondary | ICD-10-CM | POA: Diagnosis not present

## 2016-08-25 DIAGNOSIS — M79671 Pain in right foot: Secondary | ICD-10-CM | POA: Diagnosis not present

## 2016-08-30 MED FILL — diazePAM 5 MG TABS: 5 | 16 days supply | Qty: 50 | Fill #0

## 2016-08-31 MED FILL — tiZANidine HCL 4 MG TABS: 4 | 30 days supply | Qty: 90 | Fill #2

## 2016-08-31 MED FILL — traZODone HCL 100 MG TABS: 100 | 30 days supply | Qty: 60 | Fill #0

## 2016-09-03 ENCOUNTER — Other Ambulatory Visit: Payer: Self-pay | Admitting: *Deleted

## 2016-09-03 NOTE — Patient Outreach (Signed)
[facility](THN) Care Management  09/03/2016  Carly Delacruz 10/12/1965 [id number]  Subjective: Telephone call to patient's home  number, no answer, left HIPAA compliant voicemail message, and requested call back.   Objective: Per chart review, patient hospitalized 08/12/16 - 08/16/16 for Acute metabolic encephalopathy.   Patient has history of hypertension, gastric bypass surgery, polypharmacy, OSA (not on CPAP), and UTI.     Assessment: Received UMR Transition of care referral on 09/02/16.  Transition of care follow up pending patient contact.     Plan: RNCM will call patient for 2nd telephone outreach attempt, transition of care follow up, within 10 business days if no return call.    Madden Garron H. Gardiner Barefootooper RN, BSN, CCM [facility] Care Management [facility] Telephonic CM Phone: [phone] Fax: [phone]

## 2016-09-06 ENCOUNTER — Encounter: Payer: Self-pay | Admitting: *Deleted

## 2016-09-06 ENCOUNTER — Other Ambulatory Visit: Payer: Self-pay | Admitting: *Deleted

## 2016-09-06 NOTE — Patient Outreach (Signed)
[facility](THN) Care Management  09/06/2016  [place] Pihamela J Delacruz 02/13/1966 [id number]   Subjective: Telephone call to patient's home number, spoke with patient, and HIPAA verified.  [facility]MR Transition of care follow up, patient voiced understanding, and is in agreement to follow up.   Patient states she is doing better, still feels fatigue, very "drained overall", and right foot swelling from fracture.  Patient has questions regarding AME ( Acute  metabolic encephalopathy) diagnosis provided general verbal education, encouraged to follow up with primary MD for answers to additional questions, patient voices understanding, and states she will follow up with primary MD as needed.  States she saw primary MD on 08/21/16 for hospital follow up, and on 08/24/16 to follow up on right foot pain after twisting foot  at home on 08/21/16.  States she has fracture of right foot, currently out of work on family medical leave act (FMLA) due to return today for hospitalization.  States she also has intermittent FMLA in place for her back.   States she will call [employer] regarding FMLA for right foot fracture  and ask questions regarding how to handle FMLA for different medical issues.  States also has a follow up appointment with orthopedic MD on 09/09/16.  She is also planning to follow up with pain management MD regarding medications an issues related to hospitalization.   States she is accessing the following [facility] benefits: outpatient pharmacy, hospital indemnity (verbally given contact number for [facility] Ind[facility] [phone]), and verbally given contact number for [employer]ability  [phone]), and EACP (Employee Assistance Counseling Program (verbally given the contact number [phone]).   Patient states she does not have any education material, transition of care, care coordination, disease management, disease monitoring, transportation, community resource, or pharmacy  needs at this time. States she is very appreciative of the follow up and is in agreement to receive [facility] information.     Objective: Per chart review, patient hospitalized 08/12/16 - 08/16/16 for Acute metabolic encephalopathy.   Patient has history of hypertension, gastric bypass surgery, polypharmacy, OSA (not on CPAP), and UTI.     Assessment: Received UMR Transition of care referral on 09/02/16. Transition of care follow up completed, no care management needs, and will proceed with case closure.    Plan: RNCM will send patient successful outreach letter, [facility] pamphlet, and magnet. RNCM will send case closure due to follow up completed / no care management needs request to Iverson AlaminLaura Greeson [facility].    Carly Antosh H. Gardiner Barefootooper RN, BSN, CCM [facility] Care Management [facility] Telephonic CM Phone: [phone] Fax: [phone]

## 2016-09-09 DIAGNOSIS — S92354D Nondisplaced fracture of fifth metatarsal bone, right foot, subsequent encounter for fracture with routine healing: Secondary | ICD-10-CM | POA: Diagnosis not present

## 2016-09-09 DIAGNOSIS — M79671 Pain in right foot: Secondary | ICD-10-CM | POA: Diagnosis not present

## 2016-09-14 MED FILL — NUCYNTA 50 MG TABLET: 50 | 30 days supply | Qty: 120 | Fill #0

## 2016-09-14 MED FILL — FAMOTIDINE 20 MG TABLET: 20 | 30 days supply | Qty: 60 | Fill #0

## 2016-09-15 ENCOUNTER — Encounter: Payer: Self-pay | Admitting: *Deleted

## 2016-09-15 ENCOUNTER — Other Ambulatory Visit: Payer: Self-pay | Admitting: *Deleted

## 2016-09-15 DIAGNOSIS — R509 Fever, unspecified: Secondary | ICD-10-CM | POA: Diagnosis not present

## 2016-09-15 DIAGNOSIS — S92355D Nondisplaced fracture of fifth metatarsal bone, left foot, subsequent encounter for fracture with routine healing: Secondary | ICD-10-CM | POA: Diagnosis not present

## 2016-09-15 DIAGNOSIS — I10 Essential (primary) hypertension: Secondary | ICD-10-CM | POA: Diagnosis not present

## 2016-09-15 DIAGNOSIS — R1084 Generalized abdominal pain: Secondary | ICD-10-CM | POA: Diagnosis not present

## 2016-09-15 DIAGNOSIS — N3289 Other specified disorders of bladder: Secondary | ICD-10-CM | POA: Diagnosis not present

## 2016-09-15 NOTE — Patient Outreach (Signed)
[facility](THN) Care Management  09/15/2016  Carly Delacruz 10/30/1965 [id number]   Subjective: Received voicemail from patient requesting call back.  Telephone call to patient's home number, spoke with patient, and HIPAA verified.  [facility]ed [facility]MR Transition of care follow up, patient voiced understanding, and is in agreement to follow up.   Patient states she is not feeling well, does not have any energy, stomach pain, is not sure why she is not feeling well, primary MD does not know what is wrong with her, foot still hurting, has follow up with orthopedic MD on 09/23/16.   Recently took pain medication and was resting when this RNCM called for follow up.  Patient had a follow up appointment with orthopedic MD on 09/09/16, he stated that her foot was not healing,  to give it some more time, and to give it another week.   States she saw primary MD this morning, blood pressure slightly elevated, no fever, lab work done, waiting for lab results, and was unable to get a urine specimen during the office visit.  MD recommended patient call her gynecologist who is treating her for the pelvic floor dysfunction, regarding her symptoms.  Patient states has called gynecologist and is waiting for a call back regarding an urgent appointment on 09/15/16.  Patient states she is very frustrated with being out of work for a month, no money coming in, and still not feeling.   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   She is also in the process of filing her hospital indemnity supplemental insurance claims and short term disability.    She is working with [employer](Employee Assistance Counseling Program) as needed.   Patient states she has also followed up with primary MD regarding refill for Oxybutynin.  States she has followed up the on all the resources discussed with this  RNCM during 09/06/16 call.  RNCM encouraged patient to continue doing what she is doing and advocating for her care.  Patient voices understanding, states she is very appreciative of RNCM allowing her to vent her concerns, and she will follow up on the following tasks: 1. Call [employer] regarding her FMLA questions.  2. Call gynecologist regarding follow up appointment if no call received by 1:00pm today.   3. Call orthopedic MD regarding foot and FMLA paper work status.  4. Call work Merchandiser, retailsupervisor regarding return to work update and status.   5. Call primary MD as needed if symptoms are not addressed by gynecologist and orthopedic MDs.  Patient states she does not have any education material, transition of care, care coordination, disease management, disease monitoring, transportation, community resource, or pharmacy needs at this time. States she is very appreciative of the follow up and is in agreement to receive [facility] information.   Objective:Per chart review, patient hospitalized 08/12/16 - 08/16/16 for Acute metabolic encephalopathy. Patient has history of hypertension, gastric bypass surgery, polypharmacy, OSA (not on CPAP), and UTI.    Assessment:Received [facility] referral on 09/02/16 for nurse call line follow up. Transition of care follow up completed on 09/06/16, [facility] follow [facility] completed, no care management needs, and will proceed with case closure.    Plan: RNCM will send patient successful outreach letter, [facility] pamphlet, and magnet. RNCM will send case closure due to follow up completed / no care management needs request to Carly RiegerLaura  Delacruz at [facility] Care Management.    Carly Delacruz, BSN, CCM [facility] Care Management [facility] Telephonic CM Phone: [phone] Fax: [phone]

## 2016-09-23 DIAGNOSIS — M79671 Pain in right foot: Secondary | ICD-10-CM | POA: Diagnosis not present

## 2016-10-01 MED FILL — tiZANidine HCL 4 MG TABS: 4 | 30 days supply | Qty: 90 | Fill #3

## 2016-10-01 MED FILL — traZODone HCL 100 MG TABS: 100 | 30 days supply | Qty: 60 | Fill #1

## 2016-10-01 MED FILL — AMLODIPINE BESYLATE 10 MG T: 10 | 90 days supply | Qty: 90 | Fill #0

## 2016-10-07 DIAGNOSIS — S92354A Nondisplaced fracture of fifth metatarsal bone, right foot, initial encounter for closed fracture: Secondary | ICD-10-CM | POA: Diagnosis not present

## 2016-10-07 DIAGNOSIS — M79672 Pain in left foot: Secondary | ICD-10-CM | POA: Diagnosis not present

## 2016-10-07 DIAGNOSIS — M79671 Pain in right foot: Secondary | ICD-10-CM | POA: Diagnosis not present

## 2016-10-14 ENCOUNTER — Other Ambulatory Visit: Payer: Self-pay | Admitting: *Deleted

## 2016-10-14 NOTE — Patient Outreach (Addendum)
[facility]N) Care Management  10/14/2016  Carly Delacruz 10/18/1965 [id number]   Subjective: Received transferred telephone call from patient, crying, and HIPAA verified.   Patient states she remembers speaking with this RNCM in past, RNCM was very helpful, and calling to see if RNCM could assist with community resources.  States she is currently in bankrupty and taking care of elderly mother, and unable to walk due to foot fracture, and does not know what she is going to do without a job. States she was notified earlier this week that her job with [employer] would no longer be held and she is wanting to know her options.  RNCM advised patient for the following: verbally given contact number for [facility] Employee Assistance Program [place](EACP), [phone]), given contact number for [facility] Benefit Specialist [phone]), patient voiced understanding, and states she will follow up to determine options for assistance.  States she has not received a letter from [employer] with community resources, [place] options, and will inquiry with Benefit Specialist regarding letter status.   RNCM advised patient to discuss her situation with church, family/ friends, financial institutions, creditors, and see if any assistance is available.   Patient voices understanding, is very appreciative of information, RNCM taking time to listen, and states she will follow up.  RNCM offered to transfer patient to EACP via phone, patient declined, and states that she would call.  Patient states she does not have any education material, transition of care, care coordination, disease management, disease monitoring, transportation, or pharmacy needs at this time.  Objective:Per chart review, patient hospitalized 08/12/16 - 08/16/16 for Acute metabolic encephalopathy. Patient has history of hypertension, gastric bypass surgery, polypharmacy, OSA (not on CPAP), and UTI.    Assessment:Received UMR  [facility] referral on 09/02/16 for nurse call line follow up. Transition of care follow up completed on 09/06/16, [facility] follow [facility] completed, no care management needs, and case will remain closed.   Plan: Case will remain closed.  RNCM will send case status update request to Iverson AlaminLaura Greeson at [facility] Care Management.     Sephiroth Mcluckie H. Gardiner Barefootooper RN, BSN, CCM [facility] Care Management [facility] Telephonic CM Phone: [phone] Fax: [phone]

## 2016-10-15 DIAGNOSIS — E559 Vitamin D deficiency, unspecified: Secondary | ICD-10-CM | POA: Insufficient documentation

## 2016-10-15 DIAGNOSIS — Z1231 Encounter for screening mammogram for malignant neoplasm of breast: Secondary | ICD-10-CM | POA: Diagnosis not present

## 2016-10-15 DIAGNOSIS — M8580 Other specified disorders of bone density and structure, unspecified site: Secondary | ICD-10-CM | POA: Diagnosis not present

## 2016-10-15 LAB — HM MAMMOGRAPHY

## 2016-10-19 DIAGNOSIS — M461 Sacroiliitis, not elsewhere classified: Secondary | ICD-10-CM | POA: Diagnosis not present

## 2016-10-19 DIAGNOSIS — G894 Chronic pain syndrome: Secondary | ICD-10-CM | POA: Diagnosis not present

## 2016-10-19 DIAGNOSIS — G4700 Insomnia, unspecified: Secondary | ICD-10-CM | POA: Diagnosis not present

## 2016-10-19 DIAGNOSIS — M47816 Spondylosis without myelopathy or radiculopathy, lumbar region: Secondary | ICD-10-CM | POA: Diagnosis not present

## 2016-10-19 MED FILL — GABAPENTIN 300 MG CAPSULE: 300 | 30 days supply | Qty: 60 | Fill #0

## 2016-10-19 MED FILL — NUCYNTA 50 MG TABLET: 50 | 30 days supply | Qty: 30 | Fill #0

## 2016-10-19 MED FILL — VITAMIN D3 50000 UNIT CAPS: 1.25 MG | 56 days supply | Qty: 16 | Fill #0

## 2016-10-20 MED FILL — diazePAM 5 MG TABS: 5 | 16 days supply | Qty: 50 | Fill #0

## 2016-10-21 DIAGNOSIS — M79671 Pain in right foot: Secondary | ICD-10-CM | POA: Diagnosis not present

## 2016-10-21 DIAGNOSIS — M79672 Pain in left foot: Secondary | ICD-10-CM | POA: Diagnosis not present

## 2016-10-25 MED FILL — traZODone HCL 100 MG TABS: 100 | 30 days supply | Qty: 60 | Fill #2

## 2016-10-25 MED FILL — tiZANidine HCL 4 MG TABS: 4 | 30 days supply | Qty: 90 | Fill #0

## 2016-10-26 MED FILL — OXYBUTYNIN 5 MG TABLET: 5 | 30 days supply | Qty: 60 | Fill #0

## 2016-10-29 MED FILL — FAMOTIDINE 20 MG TABLET: 20 | 30 days supply | Qty: 30 | Fill #3

## 2016-11-04 MED FILL — NUCYNTA 50 MG TABLET: 50 | 15 days supply | Qty: 30 | Fill #0

## 2016-11-10 DIAGNOSIS — Z23 Encounter for immunization: Secondary | ICD-10-CM | POA: Diagnosis not present

## 2016-11-10 DIAGNOSIS — R5383 Other fatigue: Secondary | ICD-10-CM | POA: Diagnosis not present

## 2016-11-10 DIAGNOSIS — Z9884 Bariatric surgery status: Secondary | ICD-10-CM | POA: Diagnosis not present

## 2016-11-10 DIAGNOSIS — R350 Frequency of micturition: Secondary | ICD-10-CM | POA: Diagnosis not present

## 2016-11-10 DIAGNOSIS — F3341 Major depressive disorder, recurrent, in partial remission: Secondary | ICD-10-CM | POA: Diagnosis not present

## 2016-11-10 DIAGNOSIS — Z6841 Body Mass Index (BMI) 40.0 and over, adult: Secondary | ICD-10-CM | POA: Diagnosis not present

## 2016-11-10 DIAGNOSIS — R635 Abnormal weight gain: Secondary | ICD-10-CM | POA: Diagnosis not present

## 2016-11-11 DIAGNOSIS — M79671 Pain in right foot: Secondary | ICD-10-CM | POA: Diagnosis not present

## 2016-11-11 DIAGNOSIS — M79672 Pain in left foot: Secondary | ICD-10-CM | POA: Diagnosis not present

## 2016-11-16 DIAGNOSIS — M47816 Spondylosis without myelopathy or radiculopathy, lumbar region: Secondary | ICD-10-CM | POA: Diagnosis not present

## 2016-11-16 DIAGNOSIS — M461 Sacroiliitis, not elsewhere classified: Secondary | ICD-10-CM | POA: Diagnosis not present

## 2016-11-16 DIAGNOSIS — G4700 Insomnia, unspecified: Secondary | ICD-10-CM | POA: Diagnosis not present

## 2016-11-16 DIAGNOSIS — G894 Chronic pain syndrome: Secondary | ICD-10-CM | POA: Diagnosis not present

## 2016-11-17 MED FILL — NUCYNTA 50 MG TABLET: 50 | 30 days supply | Qty: 90 | Fill #0

## 2016-11-18 DIAGNOSIS — Z9884 Bariatric surgery status: Secondary | ICD-10-CM | POA: Diagnosis not present

## 2016-11-19 MED FILL — tiZANidine HCL 4 MG TABS: 4 | 30 days supply | Qty: 90 | Fill #0

## 2016-11-19 MED FILL — traZODone HCL 100 MG TABS: 100 | 30 days supply | Qty: 60 | Fill #0

## 2016-11-19 MED FILL — GABAPENTIN 300 MG CAPSULE: 300 | 30 days supply | Qty: 60 | Fill #1

## 2016-12-02 DIAGNOSIS — Z83511 Family history of glaucoma: Secondary | ICD-10-CM | POA: Diagnosis not present

## 2016-12-02 DIAGNOSIS — H40013 Open angle with borderline findings, low risk, bilateral: Secondary | ICD-10-CM | POA: Diagnosis not present

## 2016-12-02 MED FILL — FAMOTIDINE 20 MG TABLET: 20 | 30 days supply | Qty: 30 | Fill #0

## 2016-12-02 MED FILL — OXYBUTYNIN 5 MG TABLET: 5 | 30 days supply | Qty: 60 | Fill #1

## 2016-12-02 MED FILL — diazePAM 5 MG TABS: 5 | 16 days supply | Qty: 50 | Fill #0

## 2016-12-15 ENCOUNTER — Telehealth: Payer: Self-pay | Admitting: Neurology

## 2016-12-15 ENCOUNTER — Ambulatory Visit: Payer: 59 | Admitting: Neurology

## 2016-12-15 NOTE — Telephone Encounter (Signed)
This patient canceled same day of a new patient appointment, called in sick.

## 2016-12-16 ENCOUNTER — Encounter: Payer: Self-pay | Admitting: Neurology

## 2016-12-20 MED FILL — traZODone HCL 100 MG TABS: 100 | 30 days supply | Qty: 60 | Fill #1

## 2016-12-20 MED FILL — GABAPENTIN 300 MG CAPSULE: 300 | 30 days supply | Qty: 60 | Fill #0

## 2016-12-20 MED FILL — tiZANidine HCL 4 MG TABS: 4 | 30 days supply | Qty: 90 | Fill #1

## 2016-12-23 DIAGNOSIS — G894 Chronic pain syndrome: Secondary | ICD-10-CM | POA: Diagnosis not present

## 2016-12-23 DIAGNOSIS — M461 Sacroiliitis, not elsewhere classified: Secondary | ICD-10-CM | POA: Diagnosis not present

## 2016-12-23 DIAGNOSIS — G4700 Insomnia, unspecified: Secondary | ICD-10-CM | POA: Diagnosis not present

## 2016-12-23 DIAGNOSIS — Z79891 Long term (current) use of opiate analgesic: Secondary | ICD-10-CM | POA: Diagnosis not present

## 2016-12-23 DIAGNOSIS — M47816 Spondylosis without myelopathy or radiculopathy, lumbar region: Secondary | ICD-10-CM | POA: Diagnosis not present

## 2016-12-23 MED FILL — NUCYNTA 50 MG TABLET: 50 | 15 days supply | Qty: 30 | Fill #0

## 2016-12-28 MED FILL — FOLIVANE-F CAPSULE: 125-1 | 30 days supply | Qty: 30 | Fill #0

## 2017-01-03 MED FILL — diazePAM 5 MG TABS: 5 | 16 days supply | Qty: 50 | Fill #0

## 2017-01-03 MED FILL — FAMOTIDINE 20 MG TABLET: 20 | 30 days supply | Qty: 30 | Fill #1

## 2017-01-03 MED FILL — BUPROPION HCL XL 300 MG TAB: 300 | 30 days supply | Qty: 30 | Fill #0

## 2017-01-06 DIAGNOSIS — M4800 Spinal stenosis, site unspecified: Secondary | ICD-10-CM | POA: Diagnosis not present

## 2017-01-06 DIAGNOSIS — M84374A Stress fracture, right foot, initial encounter for fracture: Secondary | ICD-10-CM | POA: Diagnosis not present

## 2017-01-06 DIAGNOSIS — M84375A Stress fracture, left foot, initial encounter for fracture: Secondary | ICD-10-CM | POA: Diagnosis not present

## 2017-01-06 MED FILL — OXYBUTYNIN 5 MG TABLET: 5 | 30 days supply | Qty: 60 | Fill #2

## 2017-01-10 MED FILL — NUCYNTA 50 MG TABLET: 50 | 15 days supply | Qty: 30 | Fill #0

## 2017-01-11 ENCOUNTER — Encounter (INDEPENDENT_AMBULATORY_CARE_PROVIDER_SITE_OTHER): Payer: Self-pay

## 2017-01-11 ENCOUNTER — Encounter (HOSPITAL_COMMUNITY): Payer: Self-pay | Admitting: Psychiatry

## 2017-01-11 ENCOUNTER — Ambulatory Visit (INDEPENDENT_AMBULATORY_CARE_PROVIDER_SITE_OTHER): Payer: 59 | Admitting: Psychiatry

## 2017-01-11 VITALS — BP 132/78 | HR 80 | Ht 63.0 in | Wt 231.0 lb

## 2017-01-11 DIAGNOSIS — R454 Irritability and anger: Secondary | ICD-10-CM | POA: Diagnosis not present

## 2017-01-11 DIAGNOSIS — G4700 Insomnia, unspecified: Secondary | ICD-10-CM

## 2017-01-11 DIAGNOSIS — M549 Dorsalgia, unspecified: Secondary | ICD-10-CM

## 2017-01-11 DIAGNOSIS — R41843 Psychomotor deficit: Secondary | ICD-10-CM

## 2017-01-11 DIAGNOSIS — F419 Anxiety disorder, unspecified: Secondary | ICD-10-CM

## 2017-01-11 DIAGNOSIS — G9349 Other encephalopathy: Secondary | ICD-10-CM

## 2017-01-11 DIAGNOSIS — Z9884 Bariatric surgery status: Secondary | ICD-10-CM | POA: Diagnosis not present

## 2017-01-11 DIAGNOSIS — R413 Other amnesia: Secondary | ICD-10-CM | POA: Diagnosis not present

## 2017-01-11 DIAGNOSIS — R4584 Anhedonia: Secondary | ICD-10-CM | POA: Diagnosis not present

## 2017-01-11 DIAGNOSIS — R4582 Worries: Secondary | ICD-10-CM

## 2017-01-11 DIAGNOSIS — R5383 Other fatigue: Secondary | ICD-10-CM

## 2017-01-11 DIAGNOSIS — M2550 Pain in unspecified joint: Secondary | ICD-10-CM

## 2017-01-11 DIAGNOSIS — R42 Dizziness and giddiness: Secondary | ICD-10-CM

## 2017-01-11 DIAGNOSIS — F332 Major depressive disorder, recurrent severe without psychotic features: Secondary | ICD-10-CM

## 2017-01-11 DIAGNOSIS — R450 Nervousness: Secondary | ICD-10-CM

## 2017-01-11 DIAGNOSIS — Z810 Family history of intellectual disabilities: Secondary | ICD-10-CM

## 2017-01-11 MED ORDER — MIRTAZAPINE 15 MG PO TABS: 15.0000 mg | ORAL_TABLET | Freq: Every day | ORAL | 1 refills | Status: DC

## 2017-01-11 MED ORDER — VENLAFAXINE HCL 37.5 MG PO TABS: 37.5000 mg | ORAL_TABLET | Freq: Two times a day (BID) | ORAL | 1 refills | Status: DC

## 2017-01-11 MED FILL — VENLAFAXINE HCL 37.5 MG TAB: 37.5 | 90 days supply | Qty: 180 | Fill #0

## 2017-01-11 MED FILL — MIRTAZAPINE 15 MG TAB: 15 | 90 days supply | Qty: 90 | Fill #0
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
## 2017-01-11 NOTE — Patient Instructions (Signed)
STOP wellbutrin   STOP Trazodone  AVOID valium as much as possible  START Effexor 37.5 mg tablet at breakfast and at lunch  START Remeron 15 mg at night for sleep

## 2017-01-12 ENCOUNTER — Ambulatory Visit (INDEPENDENT_AMBULATORY_CARE_PROVIDER_SITE_OTHER): Payer: 59 | Admitting: Licensed Clinical Social Worker

## 2017-01-12 ENCOUNTER — Encounter (HOSPITAL_COMMUNITY): Payer: Self-pay | Admitting: Licensed Clinical Social Worker

## 2017-01-12 DIAGNOSIS — K219 Gastro-esophageal reflux disease without esophagitis: Secondary | ICD-10-CM | POA: Diagnosis not present

## 2017-01-12 DIAGNOSIS — F332 Major depressive disorder, recurrent severe without psychotic features: Secondary | ICD-10-CM

## 2017-01-12 DIAGNOSIS — I10 Essential (primary) hypertension: Secondary | ICD-10-CM | POA: Diagnosis not present

## 2017-01-12 DIAGNOSIS — Z6841 Body Mass Index (BMI) 40.0 and over, adult: Secondary | ICD-10-CM | POA: Diagnosis not present

## 2017-01-12 DIAGNOSIS — G894 Chronic pain syndrome: Secondary | ICD-10-CM | POA: Diagnosis not present

## 2017-01-12 DIAGNOSIS — M8588 Other specified disorders of bone density and structure, other site: Secondary | ICD-10-CM | POA: Diagnosis not present

## 2017-01-12 DIAGNOSIS — F339 Major depressive disorder, recurrent, unspecified: Secondary | ICD-10-CM | POA: Diagnosis not present

## 2017-01-12 MED FILL — AMLODIPINE BESYLATE 10 MG T: 10 | 90 days supply | Qty: 90 | Fill #0
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
## 2017-01-13 DIAGNOSIS — M79672 Pain in left foot: Secondary | ICD-10-CM | POA: Diagnosis not present

## 2017-01-13 DIAGNOSIS — M79671 Pain in right foot: Secondary | ICD-10-CM | POA: Diagnosis not present

## 2017-01-13 MED FILL — traZODone HCL 100 MG TABS: 100 | 30 days supply | Qty: 60 | Fill #2

## 2017-01-13 MED FILL — GABAPENTIN 300 MG CAPSULE: 300 | 30 days supply | Qty: 60 | Fill #1

## 2017-01-13 MED FILL — tiZANidine HCL 4 MG TABS: 4 | 30 days supply | Qty: 90 | Fill #2

## 2017-01-17 DIAGNOSIS — M79672 Pain in left foot: Secondary | ICD-10-CM | POA: Diagnosis not present

## 2017-01-17 DIAGNOSIS — M79671 Pain in right foot: Secondary | ICD-10-CM | POA: Diagnosis not present

## 2017-01-18 DIAGNOSIS — M79672 Pain in left foot: Secondary | ICD-10-CM | POA: Diagnosis not present

## 2017-01-18 DIAGNOSIS — M79671 Pain in right foot: Secondary | ICD-10-CM | POA: Diagnosis not present

## 2017-01-19 DIAGNOSIS — F329 Major depressive disorder, single episode, unspecified: Secondary | ICD-10-CM | POA: Diagnosis not present

## 2017-01-19 DIAGNOSIS — G4700 Insomnia, unspecified: Secondary | ICD-10-CM | POA: Diagnosis not present

## 2017-01-19 DIAGNOSIS — Z6839 Body mass index (BMI) 39.0-39.9, adult: Secondary | ICD-10-CM | POA: Diagnosis not present

## 2017-01-19 DIAGNOSIS — N9489 Other specified conditions associated with female genital organs and menstrual cycle: Secondary | ICD-10-CM | POA: Diagnosis not present

## 2017-01-19 DIAGNOSIS — M47816 Spondylosis without myelopathy or radiculopathy, lumbar region: Secondary | ICD-10-CM | POA: Diagnosis not present

## 2017-01-19 DIAGNOSIS — G894 Chronic pain syndrome: Secondary | ICD-10-CM | POA: Diagnosis not present

## 2017-01-19 DIAGNOSIS — M461 Sacroiliitis, not elsewhere classified: Secondary | ICD-10-CM | POA: Diagnosis not present

## 2017-01-25 ENCOUNTER — Telehealth: Payer: Self-pay | Admitting: *Deleted

## 2017-01-25 ENCOUNTER — Ambulatory Visit: Payer: 59 | Admitting: Neurology

## 2017-01-25 ENCOUNTER — Ambulatory Visit (HOSPITAL_COMMUNITY): Payer: Self-pay | Admitting: Licensed Clinical Social Worker

## 2017-01-25 NOTE — Telephone Encounter (Signed)
Patient no show new pt appt on 01/25/2017  

## 2017-01-26 ENCOUNTER — Ambulatory Visit: Payer: 59 | Admitting: Physical Therapy

## 2017-01-26 ENCOUNTER — Encounter: Payer: Self-pay | Admitting: Neurology

## 2017-01-28 ENCOUNTER — Ambulatory Visit (HOSPITAL_COMMUNITY): Payer: Self-pay | Admitting: Psychiatry

## 2017-01-28 MED FILL — FAMOTIDINE 20 MG TABLET: 20 | 30 days supply | Qty: 30 | Fill #2

## 2017-01-28 MED FILL — NUCYNTA 50 MG TABLET: 50 | 15 days supply | Qty: 30 | Fill #0

## 2017-02-08 MED FILL — tiZANidine HCL 4 MG TABS: 4 | 30 days supply | Qty: 90 | Fill #3

## 2017-02-08 MED FILL — GABAPENTIN 300 MG CAPS: 300 | 30 days supply | Qty: 60 | Fill #2

## 2017-02-08 MED FILL — OXYBUTYNIN 5 MG TABLET: 5 | 30 days supply | Qty: 60 | Fill #0

## 2017-02-10 ENCOUNTER — Encounter (HOSPITAL_COMMUNITY): Payer: Self-pay | Admitting: Psychiatry

## 2017-02-10 ENCOUNTER — Ambulatory Visit (INDEPENDENT_AMBULATORY_CARE_PROVIDER_SITE_OTHER): Payer: Self-pay | Admitting: Psychiatry

## 2017-02-10 DIAGNOSIS — F332 Major depressive disorder, recurrent severe without psychotic features: Secondary | ICD-10-CM

## 2017-02-10 DIAGNOSIS — R45850 Homicidal ideations: Secondary | ICD-10-CM

## 2017-02-10 MED ORDER — FLUOXETINE HCL 20 MG PO CAPS: 20.0000 mg | ORAL_CAPSULE | Freq: Every day | ORAL | 2 refills | Status: DC

## 2017-02-10 MED ORDER — RISPERIDONE 1 MG PO TABS: 1.0000 mg | ORAL_TABLET | Freq: Every day | ORAL | 2 refills | Status: DC

## 2017-02-10 MED FILL — risperiDONE 1 MG TABS: 1 | 30 days supply | Qty: 30 | Fill #0

## 2017-02-10 MED FILL — NUCYNTA 50 MG TABLET: 50 | 15 days supply | Qty: 30 | Fill #0

## 2017-02-10 MED FILL — FLUoxetine HCL 20 MG CAPS: 20 | 30 days supply | Qty: 30 | Fill #0

## 2017-02-10 NOTE — Progress Notes (Signed)
BH MD/PA/NP OP Progress Note  02/10/2017 9:30 AM Carly Delacruz  MRN:  [id number]  Chief Complaint: No different, came in earlier  HPI: Patient reports that her irritability and frustration is continued.  She continues with severe depression, anhedonia, apathy.  She continues to have trouble sleeping and admits that she took double the dose of Remeron to see if this would help her sleep.  I educated her that this is not okay, and in fact Remeron at higher doses tends to be activating.  I encouraged her to discuss these things with her providers.  Spent time with her empathizing with her goals of being able to sleep and shut her mind down at bedtime.  She continues to struggle with periods of confusion and forgetfulness.  She reports that her mom continues to frustrate her and she has had thoughts of frustration and anger with mom, and intrusive thoughts at times of wanting to just push her down the stairs.  She reports tearfully that she would never do anything like that, and she loves her mom dearly, and she is ashamed that she is even had the sorts of thoughts.  She has no intention to harm mom, and reports that these thoughts are not normal for her.  She feels like Effexor has given her some acid reflux, constipation, and no better in terms of mood and anxiety.  We discussed restarting Prozac which she had been on many years ago with good effect.  We had initially started Effexor to see if we can target some of her neurological pain symptoms, but the intolerance is not worth increasing the dose.  Instructed her to discontinue Effexor and initiate Prozac 20 mg.  I also instructed her to discontinue Remeron which she is out of anyways, and we will start her on risperidone 1 mg nightly.  I educated the patient on risperidone, atypical antipsychotics and the risk of metabolic side effects and tardive dyskinesia.  Instructed her to only start 1 mg nightly and do not change or increase the dose without  discussing with providers, and we will follow-up in 8 weeks as scheduled.  I encouraged her to continue in individual therapy with Beth.  Visit Diagnosis:    ICD-10-CM   1. Severe episode of recurrent major depressive disorder, without psychotic features (HCC) F33.2 FLUoxetine (PROZAC) 20 MG capsule    risperiDONE (RISPERDAL) 1 MG tablet    Past Psychiatric History: See intake H&P for full details. Reviewed, with no updates at this time.   Past Medical History:  Past Medical History:  Diagnosis Date  . Allergy   . Anemia   . Arthritis 12/10/03  . Atrophic vaginitis 2009  . BV (bacterial vaginosis) 2006  . Chronic back pain   . Chronic LLQ pain   . Complication of anesthesia   . Elevated LFTs   . Endometriosis   . Fatigue 2012  . Gallstones   . GERD (gastroesophageal reflux disease)   . H/O bladder infections   . H/O mumps   . H/O varicella   . History of candidal vulvovaginitis 03/21/03  . HTN (hypertension)   . Insomnia   . Insomnia secondary to situational depression 06/06/07  . Liver mass, right lobe 08/13/04   Nodule  . Malaise 2012  . Morbid obesity (HCC) 2005  . Osteoporosis   . S/P gastric bypass   . Sleep apnea 2005  . SUI (stress urinary incontinence, female) 08/12/03  . Trichomonas   . Vulvar lesion 03/21/03  .  Yeast infection     Past Surgical History:  Procedure Laterality Date  . ABDOMINAL HYSTERECTOMY  2001  . APPENDECTOMY  2001  . CHOLECYSTECTOMY  2011  . DILATION AND CURETTAGE OF UTERUS    . GASTRIC BYPASS  2007   [facility], Dr. Daphine Deutscher  . KNEE ARTHROSCOPY Left   . LAPAROTOMY    . TONSILLECTOMY    . WISDOM TOOTH EXTRACTION      Family Psychiatric History: See intake H&P for full details. Reviewed, with no updates at this time.   Family History:  Family History  Problem Relation Age of Onset  . Diabetes Paternal Grandmother   . Leukemia Maternal Grandmother   . Arthritis Mother   . Prostate cancer Father   . Colitis Sister      Social History:  Social History   Socioeconomic History  . Marital status: Single    Spouse name: None  . Number of children: 0  . Years of education: None  . Highest education level: None  Social Needs  . Financial resource strain: None  . Food insecurity - worry: None  . Food insecurity - inability: None  . Transportation needs - medical: None  . Transportation needs - non-medical: None  Occupational History  . Occupation: Therapist, nutritional: [employer]  Tobacco Use  . Smoking status: Never Smoker  . Smokeless tobacco: Never Used  Substance and Sexual Activity  . Alcohol use: No  . Drug use: No  . Sexual activity: No    Birth control/protection: Surgical    Comment: hysterectomy   Other Topics Concern  . None  Social History Narrative  . None    Allergies:  Allergies  Allergen Reactions  . Morphine And Related Shortness Of Breath and Itching  . Septra [Sulfamethoxazole-Trimethoprim] Anaphylaxis and Hives  . Sulfa Antibiotics Anaphylaxis, Hives and Swelling    Throat swelling  . Sulfamethoxazole-Trimethoprim Anaphylaxis and Hives  . Sulfasalazine Anaphylaxis, Hives, Swelling and Other (See Comments)    Throat swelling  . Ultram [Tramadol] Shortness Of Breath, Anxiety and Other (See Comments)    "panic attacks"   . Ciprofloxacin Other (See Comments)    This medication was given along with a sulfa. Reaction is unknown at this time. Must list due to possible allergy.  Leodis [place] [Propoxyphene N-Acetaminophen] Itching  . Adhesive [Tape] Rash  . Codeine Itching and Other (See Comments)    No change when benadryl is given  . Demerol [Meperidine] Nausea Only  . Hydromorphone Itching and Other (See Comments)    No change when benadryl is given  . Losartan Palpitations    Metabolic Disorder Labs: No results found for: HGBA1C, MPG No results found for: PROLACTIN No results found for: CHOL, TRIG, HDL, CHOLHDL, VLDL, LDLCALC Lab Results   Component Value Date   TSH 1.310 04/29/2016    Therapeutic Level Labs: No results found for: LITHIUM No results found for: VALPROATE No components found for:  CBMZ  Current Medications: Current Outpatient Medications  Medication Sig Dispense Refill  . amLODipine (NORVASC) 5 MG tablet Take 5 mg by mouth daily.    Marland Kitchen CALCIUM PO Take 1 tablet by mouth every morning.     . diazepam (VALIUM) 5 MG tablet Place 5 mg vaginally every 8 (eight) hours as needed (for pelvic floor spasms).     . ergocalciferol (VITAMIN D2) 50000 units capsule Take 50,000 Units by mouth once a week.    . famotidine (PEPCID) 20 MG tablet  Take 1 tablet (20 mg total) by mouth 2 (two) times daily. 60 tablet 0  . FLUoxetine (PROZAC) 20 MG capsule Take 1 capsule (20 mg total) by mouth daily. Take in the morning 30 capsule 2  . gabapentin (NEURONTIN) 300 MG capsule Take 1 capsule (300 mg total) by mouth 2 (two) times daily.    Marland Kitchen ibuprofen (ADVIL,MOTRIN) 600 MG tablet Take 600 mg by mouth every 8 (eight) hours as needed for headache or moderate pain.    Marland Kitchen oxybutynin (DITROPAN) 5 MG tablet Take 1 tablet (5 mg total) by mouth 2 (two) times daily. 30 tablet 0  . risperiDONE (RISPERDAL) 1 MG tablet Take 1 tablet (1 mg total) by mouth at bedtime. 30 tablet 2  . tapentadol (NUCYNTA) 50 MG tablet Take 1 tablet (50 mg total) by mouth every 12 (twelve) hours as needed for moderate pain.    Marland Kitchen tiZANidine (ZANAFLEX) 4 MG tablet Take 4 mg by mouth 3 (three) times daily as needed for muscle spasms.      No current facility-administered medications for this visit.      Musculoskeletal: Strength & Muscle Tone: abnormal and decreased Gait & Station: unsteady Patient leans: Front  Psychiatric Specialty Exam: ROS  Blood pressure 132/88, pulse 97, height 5\' 3"  (1.6 m), weight 230 lb (104.3 kg), SpO2 97 %.Body mass index is 40.74 kg/m.  General Appearance: Casual and Fairly Groomed  Eye Contact:  Fair  Speech:  Clear and Coherent   Volume:  Normal  Mood:  Anxious and Depressed  Affect:  Congruent and Depressed  Thought Process:  Goal Directed and Descriptions of Associations: Intact  Orientation:  Full (Time, Place, and Person)  Thought Content: Logical and Rumination   Suicidal Thoughts:  No  Homicidal Thoughts:  Yes.  without intent/plan  Memory:  Immediate;   Fair  Judgement:  Fair  Insight:  Fair  Psychomotor Activity:  Normal  Concentration:  Concentration: Fair  Recall:  Poor  Fund of Knowledge: Fair  Language: Fair  Akathisia:  Negative  Handed:  Right  AIMS (if indicated): not done  Assets:  Communication Skills Desire for Improvement Financial Resources/Insurance Housing  ADL's:  Intact  Cognition: WNL  Sleep:  Poor   Screenings: PHQ2-9     Patient Outreach Telephone from 04/28/2016 in [facility]  PHQ-2 Total Score  6  PHQ-9 Total Score  12       Assessment and Plan:  Carly Delacruz presents for med management follow-up requesting an earlier visit.  She has not had any positive response to Effexor or Remeron, in terms of sleep and depression.  She continues to present with depression, irritability, anhedonia, and intrusive thoughts of anger and frustration towards her mother.  She denies any intention to harm her mother and denies any plan to harm her mother, and has no history of violence towards others.  I am concerned about severe depression with psychotic-like symptoms, confounded by recent episode of delirium.  I have agreed to restart Prozac with her, which she had been on in the past with positive benefit, and we agreed to start a low-dose of risperidone nightly to help with sleep and aggressive thoughts, and augmentation of her antidepressant regimen.  He denies any suicidality or any intention to harm others.  She continues to follow up in therapy and will follow-up with this writer in 6-8 weeks or sooner if needed.   1. Severe episode of recurrent major depressive  disorder, without psychotic features (HCC)  Status of current problems: gradually worsening  Labs Ordered: No orders of the defined types were placed in this encounter.   Plan:  Discontinue Effexor, Remeron given lack of benefit Initiate Prozac 20 mg daily  Initiate risperidone 1 mg nightly Return to clinic in 6-8 weeks Continue individual therapy If mood and agitation decline, consider psychiatric hospitalization  I spent 25 minutes with the patient in direct face-to-face clinical care.  Greater than 50% of this time was spent in counseling and coordination of care with the patient.    Burnard LeighAlexander Arya Eksir, MD 02/10/2017, 9:30 AM

## 2017-02-17 MED FILL — diazePAM 5 MG TABS: 5 | 16 days supply | Qty: 50 | Fill #0

## 2017-02-28 ENCOUNTER — Ambulatory Visit (HOSPITAL_COMMUNITY): Payer: Self-pay | Admitting: Licensed Clinical Social Worker

## 2017-02-28 MED FILL — NUCYNTA 50 MG TABLET: 50 | 30 days supply | Qty: 60 | Fill #0

## 2017-03-03 DIAGNOSIS — M25511 Pain in right shoulder: Secondary | ICD-10-CM | POA: Diagnosis not present

## 2017-03-03 DIAGNOSIS — M79672 Pain in left foot: Secondary | ICD-10-CM | POA: Diagnosis not present

## 2017-03-08 MED FILL — OXYBUTYNIN 5 MG TABLET: 5 | 30 days supply | Qty: 60 | Fill #1

## 2017-03-08 MED FILL — GABAPENTIN 300 MG CAPSULE: 300 | 30 days supply | Qty: 60 | Fill #3

## 2017-03-08 MED FILL — FLUoxetine HCL 20 MG CAPS: 20 | 30 days supply | Qty: 30 | Fill #1

## 2017-03-08 MED FILL — FAMOTIDINE 20 MG TABLET: 20 | 30 days supply | Qty: 30 | Fill #3

## 2017-03-08 MED FILL — risperiDONE 1 MG TABS: 1 | 30 days supply | Qty: 30 | Fill #1

## 2017-03-09 MED FILL — tiZANidine HCL 4 MG TABS: 4 | 30 days supply | Qty: 90 | Fill #1

## 2017-03-11 DIAGNOSIS — M25512 Pain in left shoulder: Secondary | ICD-10-CM | POA: Diagnosis not present

## 2017-03-11 DIAGNOSIS — M79672 Pain in left foot: Secondary | ICD-10-CM | POA: Diagnosis not present

## 2017-03-14 ENCOUNTER — Ambulatory Visit (HOSPITAL_COMMUNITY): Payer: BLUE CROSS/BLUE SHIELD | Admitting: Licensed Clinical Social Worker

## 2017-03-14 ENCOUNTER — Telehealth (HOSPITAL_COMMUNITY): Payer: Self-pay

## 2017-03-14 ENCOUNTER — Encounter (HOSPITAL_COMMUNITY): Payer: Self-pay | Admitting: Licensed Clinical Social Worker

## 2017-03-14 DIAGNOSIS — F332 Major depressive disorder, recurrent severe without psychotic features: Secondary | ICD-10-CM

## 2017-03-14 NOTE — Telephone Encounter (Signed)
Patient is here with Beth and would like to know if she can increase the Risperdal and the Prozac. She is still feeling depressed and having difficulty falling asleep. Please review and advise, thank you

## 2017-03-14 NOTE — Progress Notes (Signed)
   THERAPIST PROGRESS NOTE  Session Time: 1:10-2pm  Participation Level: Active  Behavioral Response: CasualAlertEuthymic  Type of Therapy: Individual Therapy  Treatment Goals addressed: Depression  Interventions: CBT/Motivational Interviewing  Summary: Carly Delacruz is a 52 y.o. female   Suicidal/Homicidal: Nowithout intent/plan  Therapist Response: Today was pt's first day of individual counseling. Pt discussed her psychiatric symptoms and current life events.Discussed with pt her diagnosis of MDD and her symptoms. Spent a considerable amount of time building a therapeutic trusting relationship. Taught pt basic CBT skills.  Plan: Return again in 2 weeks.  Diagnosis: Axis 1: Severe recurrent major depressive disorder     Tinamarie Przybylski S, LCAS 03/14/2017

## 2017-03-15 ENCOUNTER — Other Ambulatory Visit (HOSPITAL_COMMUNITY): Payer: Self-pay

## 2017-03-15 MED ORDER — RISPERIDONE 2 MG PO TABS: 2.0000 mg | ORAL_TABLET | Freq: Every day | ORAL | 0 refills | Status: DC

## 2017-03-15 MED ORDER — PAROXETINE HCL 40 MG PO TABS: ORAL_TABLET | ORAL | 0 refills | Status: DC

## 2017-03-15 MED ORDER — RISPERIDONE 1 MG PO TABS: 1.0000 mg | ORAL_TABLET | Freq: Every day | ORAL | 0 refills | Status: DC

## 2017-03-15 MED ORDER — FLUOXETINE HCL 40 MG PO CAPS: 40.0000 mg | ORAL_CAPSULE | Freq: Every day | ORAL | 0 refills | Status: DC

## 2017-03-15 MED FILL — FLUoxetine HCL 40 MG CAPS: 40 | 90 days supply | Qty: 90 | Fill #0

## 2017-03-15 NOTE — Telephone Encounter (Signed)
Okay, new orders sent to the pharmacy, I called patient to let her know

## 2017-03-15 NOTE — Telephone Encounter (Signed)
Yes, we can go up to prozac 40 mg and risperidone 2 mg  as long as she is not having any side effects, intolerance

## 2017-03-16 DIAGNOSIS — M461 Sacroiliitis, not elsewhere classified: Secondary | ICD-10-CM | POA: Diagnosis not present

## 2017-03-16 DIAGNOSIS — M47816 Spondylosis without myelopathy or radiculopathy, lumbar region: Secondary | ICD-10-CM | POA: Diagnosis not present

## 2017-03-16 DIAGNOSIS — G894 Chronic pain syndrome: Secondary | ICD-10-CM | POA: Diagnosis not present

## 2017-03-16 DIAGNOSIS — G4700 Insomnia, unspecified: Secondary | ICD-10-CM | POA: Diagnosis not present

## 2017-03-16 MED FILL — diazePAM 5 MG TABS: 5 | 17 days supply | Qty: 50 | Fill #0

## 2017-03-17 ENCOUNTER — Ambulatory Visit: Payer: Self-pay | Admitting: Nurse Practitioner

## 2017-03-17 MED FILL — NUCYNTA 50 MG TABLET: 50 | 30 days supply | Qty: 90 | Fill #0

## 2017-03-24 DIAGNOSIS — M79672 Pain in left foot: Secondary | ICD-10-CM | POA: Diagnosis not present

## 2017-03-24 DIAGNOSIS — M79671 Pain in right foot: Secondary | ICD-10-CM | POA: Diagnosis not present

## 2017-03-28 MED FILL — risperiDONE 2 MG TABS: 2 | 90 days supply | Qty: 90 | Fill #0

## 2017-03-29 ENCOUNTER — Ambulatory Visit (HOSPITAL_COMMUNITY): Payer: Self-pay | Admitting: Licensed Clinical Social Worker

## 2017-03-29 DIAGNOSIS — R2681 Unsteadiness on feet: Secondary | ICD-10-CM | POA: Diagnosis not present

## 2017-03-29 DIAGNOSIS — M79671 Pain in right foot: Secondary | ICD-10-CM | POA: Diagnosis not present

## 2017-03-29 DIAGNOSIS — M79672 Pain in left foot: Secondary | ICD-10-CM | POA: Diagnosis not present

## 2017-03-29 DIAGNOSIS — R262 Difficulty in walking, not elsewhere classified: Secondary | ICD-10-CM | POA: Diagnosis not present

## 2017-03-30 ENCOUNTER — Ambulatory Visit (HOSPITAL_COMMUNITY): Payer: Self-pay | Admitting: Licensed Clinical Social Worker

## 2017-04-04 MED FILL — GABAPENTIN 300 MG CAPSULE: 300 | 30 days supply | Qty: 60 | Fill #0

## 2017-04-04 MED FILL — FAMOTIDINE 20 MG TABLET: 20 | 30 days supply | Qty: 30 | Fill #4

## 2017-04-04 MED FILL — DICLOFENAC SODIUM 1% GEL: 1 | 50 days supply | Qty: 100 | Fill #0

## 2017-04-04 MED FILL — tiZANidine HCL 4 MG TABS: 4 | 30 days supply | Qty: 90 | Fill #2

## 2017-04-05 ENCOUNTER — Ambulatory Visit (INDEPENDENT_AMBULATORY_CARE_PROVIDER_SITE_OTHER): Payer: BLUE CROSS/BLUE SHIELD | Admitting: Licensed Clinical Social Worker

## 2017-04-05 DIAGNOSIS — F332 Major depressive disorder, recurrent severe without psychotic features: Secondary | ICD-10-CM

## 2017-04-05 DIAGNOSIS — M79671 Pain in right foot: Secondary | ICD-10-CM | POA: Diagnosis not present

## 2017-04-05 DIAGNOSIS — R2681 Unsteadiness on feet: Secondary | ICD-10-CM | POA: Diagnosis not present

## 2017-04-05 DIAGNOSIS — M79672 Pain in left foot: Secondary | ICD-10-CM | POA: Diagnosis not present

## 2017-04-05 DIAGNOSIS — R262 Difficulty in walking, not elsewhere classified: Secondary | ICD-10-CM | POA: Diagnosis not present

## 2017-04-06 ENCOUNTER — Encounter (HOSPITAL_COMMUNITY): Payer: Self-pay | Admitting: Licensed Clinical Social Worker

## 2017-04-06 NOTE — Progress Notes (Signed)
Daily Group Progress Note             Program: Outpatient Evening Group   Group Time: 5:30-6:30pm  Participation Level: Active  Behavioral Response: Appropriate  Type of Therapy:  Psychoeducation/Group process  Summary of Progress: Pt participated in an introductory group. Today is her initial session of the outpatient evening group. Pt articulated expectations of the group, what she needs to feel safe in the group. She also gave some background information so the group can become more acquainted with patient.   Carly RiegerLisbeth S [name], LCAS

## 2017-04-07 ENCOUNTER — Telehealth (HOSPITAL_COMMUNITY): Payer: Self-pay

## 2017-04-07 NOTE — Telephone Encounter (Signed)
Patient is calling because she is unable to tolerate the increase in the Prozac. She said she feels "hazy", like a cloud or shadow over her. When asked to describe further, she said it is the same feeling when you are waking up from sedation. She has a 20 mg capsule left and she is going to take that tomorrow instead of the 40 mg. She is also asking if we will complete her long term disability papers. We do not start LTD, however she is already on it and this would be a recertification. Please review and advise, thank you

## 2017-04-08 NOTE — Telephone Encounter (Signed)
I suspect the haziness is from the risperidone (if she is taking it), not the prozac increase.  I would suggest she continue the 40 mg prozac and decrease the risperidone to 1 mg (she can take 1/2 of the 2 mg tablet).  We can discuss LTD when she follows up in about a week

## 2017-04-08 NOTE — Telephone Encounter (Signed)
I called patient and let her know to stay on the Prozac and decrease the Risperidone. Patient was in agreement with this and we confirmed her appointment for the 18th

## 2017-04-12 ENCOUNTER — Ambulatory Visit (HOSPITAL_COMMUNITY): Payer: Self-pay | Admitting: Licensed Clinical Social Worker

## 2017-04-12 MED FILL — diazePAM 5 MG TABS: 5 | 16 days supply | Qty: 50 | Fill #0

## 2017-04-13 ENCOUNTER — Ambulatory Visit (HOSPITAL_COMMUNITY): Payer: Self-pay | Admitting: Licensed Clinical Social Worker

## 2017-04-14 MED FILL — OXYBUTYNIN CHLORIDE 5 MG TA: 5 | 30 days supply | Qty: 60 | Fill #2

## 2017-04-14 MED FILL — FOLIVANE-F CAPSULE: 125-1 | 30 days supply | Qty: 30 | Fill #1

## 2017-04-15 DIAGNOSIS — G4700 Insomnia, unspecified: Secondary | ICD-10-CM | POA: Diagnosis not present

## 2017-04-15 DIAGNOSIS — M461 Sacroiliitis, not elsewhere classified: Secondary | ICD-10-CM | POA: Diagnosis not present

## 2017-04-15 DIAGNOSIS — Z79891 Long term (current) use of opiate analgesic: Secondary | ICD-10-CM | POA: Diagnosis not present

## 2017-04-15 DIAGNOSIS — M47816 Spondylosis without myelopathy or radiculopathy, lumbar region: Secondary | ICD-10-CM | POA: Diagnosis not present

## 2017-04-15 DIAGNOSIS — G894 Chronic pain syndrome: Secondary | ICD-10-CM | POA: Diagnosis not present

## 2017-04-15 MED FILL — NUCYNTA 50 MG TABLET: 50 | 30 days supply | Qty: 90 | Fill #0

## 2017-04-19 ENCOUNTER — Ambulatory Visit (HOSPITAL_COMMUNITY): Payer: Self-pay | Admitting: Psychiatry

## 2017-04-19 ENCOUNTER — Ambulatory Visit (HOSPITAL_COMMUNITY): Payer: Self-pay | Admitting: Licensed Clinical Social Worker

## 2017-04-26 ENCOUNTER — Ambulatory Visit (HOSPITAL_COMMUNITY): Payer: Self-pay | Admitting: Licensed Clinical Social Worker

## 2017-05-02 ENCOUNTER — Other Ambulatory Visit (INDEPENDENT_AMBULATORY_CARE_PROVIDER_SITE_OTHER): Payer: BLUE CROSS/BLUE SHIELD

## 2017-05-02 ENCOUNTER — Encounter: Payer: Self-pay | Admitting: Nurse Practitioner

## 2017-05-02 ENCOUNTER — Ambulatory Visit: Payer: BLUE CROSS/BLUE SHIELD | Admitting: Nurse Practitioner

## 2017-05-02 VITALS — BP 140/86 | HR 66 | Temp 98.6°F | Resp 16 | Ht 63.0 in | Wt 236.0 lb

## 2017-05-02 DIAGNOSIS — K219 Gastro-esophageal reflux disease without esophagitis: Secondary | ICD-10-CM | POA: Diagnosis not present

## 2017-05-02 DIAGNOSIS — F329 Major depressive disorder, single episode, unspecified: Secondary | ICD-10-CM | POA: Diagnosis not present

## 2017-05-02 DIAGNOSIS — F32A Depression, unspecified: Secondary | ICD-10-CM

## 2017-05-02 DIAGNOSIS — I10 Essential (primary) hypertension: Secondary | ICD-10-CM | POA: Diagnosis not present

## 2017-05-02 DIAGNOSIS — D649 Anemia, unspecified: Secondary | ICD-10-CM | POA: Diagnosis not present

## 2017-05-02 DIAGNOSIS — R197 Diarrhea, unspecified: Secondary | ICD-10-CM

## 2017-05-02 DIAGNOSIS — H409 Unspecified glaucoma: Secondary | ICD-10-CM | POA: Insufficient documentation

## 2017-05-02 LAB — COMPREHENSIVE METABOLIC PANEL
ALBUMIN: 3.9 g/dL (ref 3.5–5.2)
ALK PHOS: 86 U/L (ref 39–117)
ALT: 12 U/L (ref 0–35)
AST: 15 U/L (ref 0–37)
BUN: 16 mg/dL (ref 6–23)
CHLORIDE: 102 meq/L (ref 96–112)
CO2: 29 mEq/L (ref 19–32)
CREATININE: 0.91 mg/dL (ref 0.40–1.20)
Calcium: 9.1 mg/dL (ref 8.4–10.5)
GFR: 82.93 mL/min (ref >60.00)
Glucose, Bld: 95 mg/dL (ref 70–99)
Potassium: 3.6 mEq/L (ref 3.5–5.1)
SODIUM: 140 meq/L (ref 135–145)
Total Bilirubin: 0.3 mg/dL (ref 0.2–1.2)
Total Protein: 7.4 g/dL (ref 6.0–8.3)

## 2017-05-02 LAB — IBC PANEL
Iron: 39 ug/dL — ABNORMAL LOW (ref 42–145)
Saturation Ratios: 11.2 % — ABNORMAL LOW (ref 20.0–50.0)
Transferrin: 248 mg/dL (ref 212.0–360.0)

## 2017-05-02 LAB — CBC WITH DIFFERENTIAL/PLATELET
BASOS PCT: 0.6 % (ref 0.0–3.0)
Basophils Absolute: 0 10*3/uL (ref 0.0–0.1)
EOS ABS: 0 10*3/uL (ref 0.0–0.7)
EOS PCT: 0.4 % (ref 0.0–5.0)
HEMATOCRIT: 40.4 % (ref 36.0–46.0)
HEMOGLOBIN: 13.1 g/dL (ref 12.0–15.0)
LYMPHS PCT: 21.5 % (ref 12.0–46.0)
Lymphs Abs: 1 10*3/uL (ref 0.7–4.0)
MCHC: 32.4 g/dL (ref 30.0–36.0)
MCV: 87.3 fl (ref 78.0–100.0)
MONO ABS: 0.3 10*3/uL (ref 0.1–1.0)
Monocytes Relative: 6.3 % (ref 3.0–12.0)
Neutro Abs: 3.2 10*3/uL (ref 1.4–7.7)
Neutrophils Relative %: 71.2 % (ref 43.0–77.0)
Platelets: 295 10*3/uL (ref 150.0–400.0)
RBC: 4.63 Mil/uL (ref 3.87–5.11)
RDW: 14.1 % (ref 11.5–15.5)
WBC: 4.6 10*3/uL (ref 4.0–10.5)

## 2017-05-02 LAB — VITAMIN B12: Vitamin B-12: 628 pg/mL (ref 211–911)

## 2017-05-02 LAB — LIPASE: Lipase: 14 U/L (ref 11.0–59.0)

## 2017-05-02 LAB — AMYLASE: Amylase: 39 U/L (ref 27–131)

## 2017-05-02 LAB — FERRITIN: Ferritin: 70.8 ng/mL (ref 10.0–291.0)

## 2017-05-02 LAB — TSH: TSH: 0.71 u[IU]/mL (ref 0.35–4.50)

## 2017-05-02 NOTE — Progress Notes (Signed)
Name: Carly Delacruz   MRN: [id number]    DOB: 02/18/1966   Date:05/02/2017       Progress Note  Subjective  Chief Complaint  Chief Complaint  Patient presents with  . Establish Care    left foot pain    HPI Ms Ave FilterChandler is establishing care with me today. She decided to switch to a new primary care provider due to communication issues with prior provider.  She also follows with pain management for chronic back pain and neuropathy,  urogynecologist for pelvic floor dysfunction, psychiatry for depresion, opthalmology for glaucoma.  Hypertension -maintained on amlodipine 10mg  daily Reports she sometimes forgets a dose but mostly tries to take the medication every day. Reports she does not check her blood pressure routinely. Denies chest pain, shortness of breath. She experiences occasionally headaches but they are rare. She reports mild edema to bilateral feet, which she has noticed since she fractured both feet last year  BP Readings from Last 3 Encounters:  05/02/17 140/86  08/16/16 (!) 149/65  05/08/16 138/85   GERD-maintained on famotidine 20 daily, which controls her symptoms of heartburn. Reports she tolerates the medication well with no noted side effects Denies hoarseness, dysphagia, acid regurgiation.  Anemia- maintained on MVI, iron, B12 daily Denies any abnormal bleeding or bruising. Colonoscopy up to date-normal in 2015  Diarrhea-  This is not an acute problem, but began around November She reports loose runny stools about twice a day after every meal. She does have nausea and decreased appetite. She denies fevers, weight loss, abdominal pain, vomiting, urinary frequency. She did receive intravenous antibiotics during a hospitalization last June-about 5 months prior to the diarrhea onset She had gastric bypass in 2007  Patient Active Problem List   Diagnosis Date Noted  . Acute lower UTI   . Acute metabolic encephalopathy 08/12/2016  . HTN (hypertension)  08/12/2016  . Abnormal urinalysis 08/12/2016  . Chronic back pain 08/12/2016  . Anemia 08/12/2016  . H/O bladder infections 08/12/2016  . S/P gastric bypass 08/12/2016  . Polypharmacy   . Pain in left ankle and joints of left foot 03/08/2016  . Sleep-disordered breathing 07/26/2014  . GERD (gastroesophageal reflux disease) 06/15/2014  . Neuropathy 06/15/2014  . Headache 06/15/2014  . Essential hypertension 06/15/2014  . Hyperkalemia 06/14/2014  . Dizziness 06/14/2014  . History of gastric bypass 05/08/2013  . Obesity 05/08/2013  . Back pain 04/26/2012  . Morbid obesity (HCC) 03/02/2003    Past Surgical History:  Procedure Laterality Date  . ABDOMINAL HYSTERECTOMY  2001  . APPENDECTOMY  2001  . CHOLECYSTECTOMY  2011  . DILATION AND CURETTAGE OF UTERUS    . GASTRIC BYPASS  2007   [facility], Dr. Daphine DeutscherMartin  . KNEE ARTHROSCOPY Left   . LAPAROTOMY    . TONSILLECTOMY    . WISDOM TOOTH EXTRACTION      Family History  Problem Relation Age of Onset  . Diabetes Paternal Grandmother   . Leukemia Maternal Grandmother   . Arthritis Mother   . Prostate cancer Father   . Colitis Sister     Social History   Socioeconomic History  . Marital status: Single    Spouse name: Not on file  . Number of children: 0  . Years of education: Not on file  . Highest education level: Not on file  Social Needs  . Financial resource strain: Not on file  . Food insecurity - worry: Not on file  . Food insecurity - inability:  Not on file  . Transportation needs - medical: Not on file  . Transportation needs - non-medical: Not on file  Occupational History  . Occupation: Therapist, nutritional: [employer]  Tobacco Use  . Smoking status: Never Smoker  . Smokeless tobacco: Never Used  Substance and Sexual Activity  . Alcohol use: No  . Drug use: No  . Sexual activity: No    Birth control/protection: Surgical    Comment: hysterectomy   Other Topics Concern  .  Not on file  Social History Narrative  . Not on file     Current Outpatient Medications:  .  acetaminophen (TYLENOL) 500 MG tablet, Take 500 mg by mouth every 6 (six) hours as needed., Disp: , Rfl:  .  amLODipine (NORVASC) 10 MG tablet, Take 10 mg by mouth daily. , Disp: , Rfl:  .  diazepam (VALIUM) 5 MG tablet, Place 5 mg vaginally every 8 (eight) hours as needed (for pelvic floor spasms). , Disp: , Rfl:  .  FeFum-FePoly-FA-B Cmp-C-Biot (FOLIVANE-PLUS) CAPS, Take by mouth., Disp: , Rfl:  .  FLUoxetine (PROZAC) 40 MG capsule, Take 1 capsule (40 mg total) by mouth daily., Disp: 90 capsule, Rfl: 0 .  gabapentin (NEURONTIN) 300 MG capsule, Take 1 capsule (300 mg total) by mouth 2 (two) times daily., Disp: , Rfl:  .  OVER THE COUNTER MEDICATION, Ultra joint complex vitamin, Disp: , Rfl:  .  oxybutynin (DITROPAN) 5 MG tablet, Take 1 tablet (5 mg total) by mouth 2 (two) times daily., Disp: 30 tablet, Rfl: 0 .  tapentadol (NUCYNTA) 50 MG tablet, Take 1 tablet (50 mg total) by mouth every 12 (twelve) hours as needed for moderate pain. (Patient taking differently: Take 50 mg by mouth 3 (three) times daily. ), Disp: , Rfl:  .  tiZANidine (ZANAFLEX) 4 MG tablet, Take 4 mg by mouth 3 (three) times daily as needed for muscle spasms. , Disp: , Rfl:  .  Zinc 50 MG CAPS, Take 50 mg by mouth., Disp: , Rfl:   Allergies  Allergen Reactions  . Morphine And Related Shortness Of Breath and Itching  . Septra [Sulfamethoxazole-Trimethoprim] Anaphylaxis and Hives  . Sulfa Antibiotics Anaphylaxis, Hives and Swelling    Throat swelling  . Sulfamethoxazole-Trimethoprim Anaphylaxis and Hives  . Sulfasalazine Anaphylaxis, Hives, Swelling and Other (See Comments)    Throat swelling  . Ultram [Tramadol] Shortness Of Breath, Anxiety and Other (See Comments)    "panic attacks"   . Ciprofloxacin Other (See Comments)    This medication was given along with a sulfa. Reaction is unknown at this time. Must list due to  possible allergy.  Leodis Liverpool [Propoxyphene N-Acetaminophen] Itching  . Adhesive [Tape] Rash  . Codeine Itching and Other (See Comments)    No change when benadryl is given  . Demerol [Meperidine] Nausea Only  . Hydromorphone Itching and Other (See Comments)    No change when benadryl is given  . Losartan Palpitations     Review of Systems  Constitutional: Negative for chills and fever.  HENT: Negative for sore throat.   Respiratory: Negative for cough and shortness of breath.   Cardiovascular: Positive for leg swelling. Negative for chest pain.  Gastrointestinal: Positive for diarrhea. Negative for heartburn, nausea and vomiting.  Genitourinary: Negative for frequency.  Musculoskeletal: Negative for falls.  Skin: Negative for rash.  Neurological: Positive for headaches.  Endo/Heme/Allergies: Does not bruise/bleed easily.    Objective  Vitals:   05/02/17 0901  BP: 140/86  Pulse: 66  Resp: 16  Temp: 98.6 F (37 C)  TempSrc: Oral  SpO2: 94%  Weight: 236 lb (107 kg)  Height: 5\' 3"  (1.6 m)    Body mass index is 41.81 kg/m.  Physical Exam Vital signs reviewed. Constitutional: Patient appears well-developed and well-nourished. Obese. No distress.  HENT: Head: Normocephalic and atraumatic. Nose: Nose normal. Mouth/Throat: Oropharynx is clear and moist.  Eyes: Conjunctivae and EOM are normal. Pupils are equal, round, and reactive to light. No scleral icterus.  Neck: Normal range of motion. Neck supple. Cardiovascular: Normal rate, regular rhythm and normal heart sounds. Distal pulses intact. Pulmonary/Chest: Effort normal and breath sounds normal. No respiratory distress. Abdominal: Soft. Bowel sounds are normal, no distension. There is mkild generalized tenderness. There is no masses Musculoskeletal: Normal range of motion. No gross deformities Neurological: She is alert and oriented to person, place, and time. Coordination, balance, strength, speech are normal.  ambulatory with walker. Skin: Skin is warm and dry. No rash noted. No erythema.  Psychiatric: Patient has a flat affect. behavior is normal. Judgment and thought content normal.   Assessment & Plan RTC in about 3 months for CPE  Diarrhea, unspecified type Will consider imaging, referral to GI pending lab reults - Comprehensive metabolic panel; Future - CBC with Differential/Platelet; Future - TSH; Future - IBC panel; Future - Ferritin; Future - B12; Future - Amylase; Future - Lipase; Future

## 2017-05-02 NOTE — Patient Instructions (Addendum)
Please head downstairs for lab work.  Please return in about 3 months for your annual physical.  It was good to meet you. Welcome to [facility]!

## 2017-05-03 ENCOUNTER — Ambulatory Visit (HOSPITAL_COMMUNITY): Payer: Self-pay | Admitting: Licensed Clinical Social Worker

## 2017-05-04 ENCOUNTER — Ambulatory Visit (HOSPITAL_COMMUNITY): Payer: Self-pay | Admitting: Licensed Clinical Social Worker

## 2017-05-04 MED FILL — tiZANidine HCL 4 MG TABS: 4 | 30 days supply | Qty: 90 | Fill #3

## 2017-05-04 MED FILL — GABAPENTIN 300 MG CAPSULE: 300 | 30 days supply | Qty: 60 | Fill #1

## 2017-05-06 NOTE — Progress Notes (Unsigned)
amb ref °

## 2017-05-08 ENCOUNTER — Encounter: Payer: Self-pay | Admitting: Nurse Practitioner

## 2017-05-08 DIAGNOSIS — F329 Major depressive disorder, single episode, unspecified: Secondary | ICD-10-CM | POA: Insufficient documentation

## 2017-05-08 DIAGNOSIS — F32A Depression, unspecified: Secondary | ICD-10-CM | POA: Insufficient documentation

## 2017-05-08 NOTE — Assessment & Plan Note (Signed)
Stable, continue famotidine

## 2017-05-08 NOTE — Assessment & Plan Note (Signed)
Continue MVI, iron, B12 supplements - CBC with Differential/Platelet; Future - IBC panel; Future - Ferritin; Future - B12; Future

## 2017-05-08 NOTE — Assessment & Plan Note (Signed)
Stable, continue amlodipine at current dosage - Comprehensive metabolic panel; Future

## 2017-05-10 ENCOUNTER — Ambulatory Visit (INDEPENDENT_AMBULATORY_CARE_PROVIDER_SITE_OTHER): Payer: BLUE CROSS/BLUE SHIELD | Admitting: Licensed Clinical Social Worker

## 2017-05-10 DIAGNOSIS — F332 Major depressive disorder, recurrent severe without psychotic features: Secondary | ICD-10-CM

## 2017-05-11 ENCOUNTER — Encounter (HOSPITAL_COMMUNITY): Payer: Self-pay | Admitting: Licensed Clinical Social Worker

## 2017-05-11 NOTE — Progress Notes (Signed)
Daily Group Progress Note        Program: OP   Group Time: 5:30-6:30  Participation Level: Active  Behavioral Response: Appropriate  Type of Therapy:  Psychoeducation/Therapy  Summary of Progress: Pt participated in a discussion on strategies for coping with anxiety and depressive symptoms. Pt identified which behaviors, thoughts and feelings have combined to continue her depressive symptoms. Pt shared with the group the coping skills that work for her, giving suggestions to other group members. Pt was encouraged to continue using her coping skills. Pt was receptive to suggestions and intervention.   Alexandre Lightsey S. Mariesha Venturella, LCAS   

## 2017-05-12 ENCOUNTER — Telehealth (HOSPITAL_COMMUNITY): Payer: Self-pay | Admitting: Psychiatry

## 2017-05-12 NOTE — Telephone Encounter (Signed)
D:  Patient called inquiring about MH-IOP.  States that her therapist Eustaquio Maize [place], [place]) referred her.  According to pt, she called her insurance company and she hasn't met her deductible as of yet.  "They said once I meet my deductible the program would be covered 80%.  Pt declining IOP at this time; but plans to call writer for a start date once she meets her deductible.  A:  Inform [employer], LCAS.  R:  Pt receptive.   Dellia Nims, M.Ed, CNA

## 2017-05-16 DIAGNOSIS — M4056 Lordosis, unspecified, lumbar region: Secondary | ICD-10-CM | POA: Diagnosis not present

## 2017-05-16 DIAGNOSIS — M47816 Spondylosis without myelopathy or radiculopathy, lumbar region: Secondary | ICD-10-CM | POA: Diagnosis not present

## 2017-05-16 DIAGNOSIS — M4807 Spinal stenosis, lumbosacral region: Secondary | ICD-10-CM | POA: Diagnosis not present

## 2017-05-16 DIAGNOSIS — M5136 Other intervertebral disc degeneration, lumbar region: Secondary | ICD-10-CM | POA: Diagnosis not present

## 2017-05-17 ENCOUNTER — Ambulatory Visit (HOSPITAL_COMMUNITY): Payer: Self-pay | Admitting: Licensed Clinical Social Worker

## 2017-05-18 ENCOUNTER — Ambulatory Visit (HOSPITAL_COMMUNITY): Payer: Self-pay | Admitting: Licensed Clinical Social Worker

## 2017-05-24 ENCOUNTER — Ambulatory Visit (HOSPITAL_COMMUNITY): Payer: Self-pay | Admitting: Licensed Clinical Social Worker

## 2017-05-24 DIAGNOSIS — M47816 Spondylosis without myelopathy or radiculopathy, lumbar region: Secondary | ICD-10-CM | POA: Diagnosis not present

## 2017-05-24 DIAGNOSIS — G894 Chronic pain syndrome: Secondary | ICD-10-CM | POA: Diagnosis not present

## 2017-05-24 DIAGNOSIS — M461 Sacroiliitis, not elsewhere classified: Secondary | ICD-10-CM | POA: Diagnosis not present

## 2017-05-24 DIAGNOSIS — G4700 Insomnia, unspecified: Secondary | ICD-10-CM | POA: Diagnosis not present

## 2017-05-24 MED FILL — FAMOTIDINE 20 MG TABLET: 20 | 30 days supply | Qty: 30 | Fill #5

## 2017-05-24 MED FILL — OXYBUTYNIN CHLORIDE 5 MG TA: 5 | 30 days supply | Qty: 60 | Fill #3

## 2017-05-24 MED FILL — NUCYNTA 50 MG TABLET: 50 | 30 days supply | Qty: 90 | Fill #0

## 2017-05-27 ENCOUNTER — Ambulatory Visit (HOSPITAL_COMMUNITY): Payer: Self-pay | Admitting: Psychiatry

## 2017-05-31 ENCOUNTER — Ambulatory Visit (HOSPITAL_COMMUNITY): Payer: Self-pay | Admitting: Licensed Clinical Social Worker

## 2017-06-01 ENCOUNTER — Ambulatory Visit (HOSPITAL_COMMUNITY): Payer: Self-pay | Admitting: Licensed Clinical Social Worker

## 2017-06-02 MED FILL — GABAPENTIN 300 MG CAPSULE: 300 | 30 days supply | Qty: 60 | Fill #2

## 2017-06-02 MED FILL — diazePAM 5 MG TABS: 5 | 16 days supply | Qty: 50 | Fill #0

## 2017-06-06 MED FILL — tiZANidine HCL 4 MG TABS: 4 | 30 days supply | Qty: 90 | Fill #0

## 2017-06-07 ENCOUNTER — Ambulatory Visit (HOSPITAL_COMMUNITY): Payer: Self-pay | Admitting: Licensed Clinical Social Worker

## 2017-06-07 DIAGNOSIS — M4807 Spinal stenosis, lumbosacral region: Secondary | ICD-10-CM | POA: Diagnosis not present

## 2017-06-09 ENCOUNTER — Ambulatory Visit (HOSPITAL_COMMUNITY): Payer: Self-pay | Admitting: Licensed Clinical Social Worker

## 2017-06-10 ENCOUNTER — Encounter (HOSPITAL_COMMUNITY): Payer: Self-pay | Admitting: Psychiatry

## 2017-06-10 ENCOUNTER — Ambulatory Visit (HOSPITAL_COMMUNITY): Payer: BLUE CROSS/BLUE SHIELD | Admitting: Psychiatry

## 2017-06-10 VITALS — BP 126/80 | HR 97 | Ht 63.0 in | Wt 239.0 lb

## 2017-06-10 DIAGNOSIS — F4312 Post-traumatic stress disorder, chronic: Secondary | ICD-10-CM | POA: Diagnosis not present

## 2017-06-10 DIAGNOSIS — F332 Major depressive disorder, recurrent severe without psychotic features: Secondary | ICD-10-CM | POA: Diagnosis not present

## 2017-06-10 DIAGNOSIS — F411 Generalized anxiety disorder: Secondary | ICD-10-CM

## 2017-06-10 MED ORDER — FLUOXETINE HCL 40 MG PO CAPS: 40.0000 mg | ORAL_CAPSULE | Freq: Every day | ORAL | 0 refills | Status: DC

## 2017-06-10 MED ORDER — AMITRIPTYLINE HCL 25 MG PO TABS: ORAL_TABLET | ORAL | 1 refills | Status: DC

## 2017-06-10 MED FILL — FLUoxetine HCL 40 MG CAPS: 40 | 90 days supply | Qty: 90 | Fill #0

## 2017-06-10 MED FILL — AMITRIPTYLINE HCL 25 MG TAB: 25 | 90 days supply | Qty: 180 | Fill #0

## 2017-06-10 NOTE — Patient Instructions (Signed)
Call [facility] for assessment [phone]

## 2017-06-10 NOTE — Progress Notes (Signed)
BH MD/PA/NP OP Progress Note  06/10/2017 10:03 AM Carly Delacruz  MRN:  [id number]  Chief Complaint: med management  HPI: Carly Delacruz reports that her depression is the same, but her anxiety is a little better with the Prozac.  Her sleep continues to be very poor approximately 4 hours a night despite use of risperidone.  She stopped taking it and reports she still only sleeps around 4 hours a night.  Much of this is limited by chronic back pain.  We agreed to initiate amitriptyline as augmentation for Prozac, and titrate to 50 mg as tolerated.  Reviewed the risks and benefits including sedation, confusion, falls, dry mouth, fatigue.  She denies any acute safety issues or thoughts to harm anyone else.  She reports that her mom continues to be a source of frustration but she is getting used to dealing with it and feels like Prozac has helped in this regard.  No auditory or visual hallucinations other than sleep related hypnagogic and hypnopompic experiences.  Visit Diagnosis:    ICD-10-CM   1. Severe episode of recurrent major depressive disorder, without psychotic features (HCC) F33.2 amitriptyline (ELAVIL) 25 MG tablet    FLUoxetine (PROZAC) 40 MG capsule  2. Chronic post-traumatic stress disorder (PTSD) F43.12 amitriptyline (ELAVIL) 25 MG tablet    FLUoxetine (PROZAC) 40 MG capsule  3. GAD (generalized anxiety disorder) F41.1 amitriptyline (ELAVIL) 25 MG tablet    FLUoxetine (PROZAC) 40 MG capsule    Past Psychiatric History: See intake H&P for full details. Reviewed, with no updates at this time.  Past Medical History:  Past Medical History:  Diagnosis Date  . Allergy   . Anemia   . Arthritis 12/10/03  . Atrophic vaginitis 2009  . BV (bacterial vaginosis) 2006  . Chronic back pain   . Chronic LLQ pain   . Complication of anesthesia   . Elevated LFTs   . Endometriosis   . Fatigue 2012  . Gallstones   . GERD (gastroesophageal reflux disease)   . H/O bladder infections    . H/O mumps   . H/O varicella   . History of candidal vulvovaginitis 03/21/03  . HTN (hypertension)   . Insomnia   . Insomnia secondary to situational depression 06/06/07  . Liver mass, right lobe 08/13/04   Nodule  . Malaise 2012  . Morbid obesity (HCC) 2005  . Osteoporosis   . S/P gastric bypass   . Sleep apnea 2005  . SUI (stress urinary incontinence, female) 08/12/03  . Trichomonas   . Vulvar lesion 03/21/03  . Yeast infection     Past Surgical History:  Procedure Laterality Date  . ABDOMINAL HYSTERECTOMY  2001  . APPENDECTOMY  2001  . CHOLECYSTECTOMY  2011  . DILATION AND CURETTAGE OF UTERUS    . GASTRIC BYPASS  2007   [facility], Dr. Daphine Deutscher  . KNEE ARTHROSCOPY Left   . LAPAROTOMY    . TONSILLECTOMY    . WISDOM TOOTH EXTRACTION      Family Psychiatric History: See intake H&P for full details. Reviewed, with no updates at this time.  Family History:  Family History  Problem Relation Age of Onset  . Diabetes Paternal Grandmother   . Leukemia Maternal Grandmother   . Arthritis Mother   . Prostate cancer Father   . Colitis Sister     Social History:  Social History   Socioeconomic History  . Marital status: Single    Spouse name: Not on file  .  Number of children: 0  . Years of education: Not on file  . Highest education level: Not on file  Occupational History  . Occupation: Therapist, nutritionaladnmission associate    Employer: [employer]  Social Needs  . Financial resource strain: Not on file  . Food insecurity:    Worry: Not on file    Inability: Not on file  . Transportation needs:    Medical: Not on file    Non-medical: Not on file  Tobacco Use  . Smoking status: Never Smoker  . Smokeless tobacco: Never Used  Substance and Sexual Activity  . Alcohol use: No  . Drug use: No  . Sexual activity: Never    Birth control/protection: Surgical    Comment: hysterectomy   Lifestyle  . Physical activity:    Days per week: Not on file    Minutes per  session: Not on file  . Stress: Not on file  Relationships  . Social connections:    Talks on phone: Not on file    Gets together: Not on file    Attends religious service: Not on file    Active member of club or organization: Not on file    Attends meetings of clubs or organizations: Not on file    Relationship status: Not on file  Other Topics Concern  . Not on file  Social History Narrative  . Not on file    Allergies:  Allergies  Allergen Reactions  . Morphine And Related Shortness Of Breath and Itching  . Septra [Sulfamethoxazole-Trimethoprim] Anaphylaxis and Hives  . Sulfa Antibiotics Anaphylaxis, Hives and Swelling    Throat swelling  . Sulfamethoxazole-Trimethoprim Anaphylaxis and Hives  . Sulfasalazine Anaphylaxis, Hives, Swelling and Other (See Comments)    Throat swelling  . Ultram [Tramadol] Shortness Of Breath, Anxiety and Other (See Comments)    "panic attacks"   . Ciprofloxacin Other (See Comments)    This medication was given along with a sulfa. Reaction is unknown at this time. Must list due to possible allergy.  Leodis [place]. Darvocet [Propoxyphene N-Acetaminophen] Itching  . Adhesive [Tape] Rash  . Codeine Itching and Other (See Comments)    No change when benadryl is given  . Demerol [Meperidine] Nausea Only  . Hydromorphone Itching and Other (See Comments)    No change when benadryl is given  . Losartan Palpitations    Metabolic Disorder Labs: No results found for: HGBA1C, MPG No results found for: PROLACTIN No results found for: CHOL, TRIG, HDL, CHOLHDL, VLDL, LDLCALC Lab Results  Component Value Date   TSH 0.71 05/02/2017   TSH 1.310 04/29/2016    Therapeutic Level Labs: No results found for: LITHIUM No results found for: VALPROATE No components found for:  CBMZ  Current Medications: Current Outpatient Medications  Medication Sig Dispense Refill  . acetaminophen (TYLENOL) 500 MG tablet Take 500 mg by mouth every 6 (six) hours as needed.    Marland Kitchen.  amitriptyline (ELAVIL) 25 MG tablet Take 1 tablet (25 mg total) by mouth at bedtime for 14 days, THEN 2 tablets (50 mg total) at bedtime. 180 tablet 1  . amLODipine (NORVASC) 10 MG tablet Take 10 mg by mouth daily.     . diazepam (VALIUM) 5 MG tablet Place 5 mg vaginally every 8 (eight) hours as needed (for pelvic floor spasms).     . famotidine (PEPCID) 20 MG tablet Take 20 mg by mouth 2 (two) times daily.    Marland Kitchen. FeFum-FePoly-FA-B Cmp-C-Biot (FOLIVANE-PLUS) CAPS Take by mouth.    .Marland Kitchen  FLUoxetine (PROZAC) 40 MG capsule Take 1 capsule (40 mg total) by mouth daily. 90 capsule 0  . gabapentin (NEURONTIN) 300 MG capsule Take 1 capsule (300 mg total) by mouth 2 (two) times daily.    Marland Kitchen OVER THE COUNTER MEDICATION Ultra joint complex vitamin    . oxybutynin (DITROPAN) 5 MG tablet Take 1 tablet (5 mg total) by mouth 2 (two) times daily. 30 tablet 0  . tapentadol (NUCYNTA) 50 MG tablet Take 1 tablet (50 mg total) by mouth every 12 (twelve) hours as needed for moderate pain. (Patient taking differently: Take 50 mg by mouth 3 (three) times daily. )    . tiZANidine (ZANAFLEX) 4 MG tablet Take 4 mg by mouth 3 (three) times daily as needed for muscle spasms.     . Zinc 50 MG CAPS Take 50 mg by mouth.     No current facility-administered medications for this visit.     Musculoskeletal: Strength & Muscle Tone: within normal limits Gait & Station: normal Patient leans: N/A  Psychiatric Specialty Exam: ROS  Blood pressure 126/80, pulse 97, height 5\' 3"  (1.6 m), weight 239 lb (108.4 kg), SpO2 96 %.Body mass index is 42.34 kg/m.  General Appearance: Casual and Fairly Groomed  Eye Contact:  Good  Speech:  Clear and Coherent  Volume:  Normal  Mood:  Depressed and Dysphoric  Affect:  Congruent  Thought Process:  Coherent, Goal Directed and Descriptions of Associations: Intact  Orientation:  Full (Time, Place, and Person)  Thought Content: Logical   Suicidal Thoughts:  No  Homicidal Thoughts:  No  Memory:   Immediate;   Fair  Judgement:  Fair  Insight:  Good  Psychomotor Activity:  Normal  Concentration:  Concentration: Fair  Recall:  [employer] of Knowledge: Fair  Language: Fair  Akathisia:  Negative  Handed:  Right  AIMS (if indicated): not done  Assets:  Communication Skills Desire for Improvement Financial Resources/Insurance Housing  ADL's:  Intact  Cognition: WNL  Sleep:  Poor   Screenings: PHQ2-9     Patient Outreach Telephone from 04/28/2016 in [facility]  PHQ-2 Total Score  6  PHQ-9 Total Score  12      Assessment and Plan:  Carly Delacruz has had gradual improvement of her anxiety, but continues with depressive symptoms and insomnia, complicated by chronic pain and symptoms of IBS.  She is going to consult with a GI doctor.  We have agreed to initiate try cyclic as below for augmentation and I am hopeful this will help with her pain and GI symptoms as well.  No acute safety issues and we will follow-up in 4 weeks.  1. Severe episode of recurrent major depressive disorder, without psychotic features (HCC)   2. Chronic post-traumatic stress disorder (PTSD)   3. GAD (generalized anxiety disorder)     Status of current problems: gradually improving  Labs Ordered: No orders of the defined types were placed in this encounter.   Labs Reviewed: N/A  Collateral Obtained/Records Reviewed: N/A  Plan:  Continue Prozac 40 mg daily Risperidone discontinued due to lack of benefit Initiate amitriptyline 25 mg x 2 weeks, then increase to 50 mg; should be taken at night  I spent 25 minutes with the patient in direct face-to-face clinical care.  Greater than 50% of this time was spent in counseling and coordination of care with the patient.    Burnard Leigh, MD 06/10/2017, 10:03 AM

## 2017-06-14 ENCOUNTER — Ambulatory Visit (HOSPITAL_COMMUNITY): Payer: Self-pay | Admitting: Licensed Clinical Social Worker

## 2017-06-21 ENCOUNTER — Ambulatory Visit (HOSPITAL_COMMUNITY): Payer: Self-pay | Admitting: Licensed Clinical Social Worker

## 2017-06-25 ENCOUNTER — Telehealth: Payer: Self-pay | Admitting: Nurse Practitioner

## 2017-06-25 NOTE — Telephone Encounter (Signed)
Can you please call Carly Delacruz and ask her to schedule follow up appointments with her urologist and/or urogynecologist? Her psychiatrist wrote me with concern for her frequent UTI symptoms and recommended she have routine urine testing to r/o UTI. I would like for her to let her urologist , urogynecologist know about these recommendations. If she has any difficulty with this, we can contact their office to discuss. Please let me know her response. Thanks!

## 2017-06-27 MED FILL — GABAPENTIN 300 MG CAPSULE: 300 | 30 days supply | Qty: 60 | Fill #3

## 2017-06-27 MED FILL — NUCYNTA 50 MG TABLET: 50 | 30 days supply | Qty: 90 | Fill #0

## 2017-06-27 MED FILL — OXYBUTYNIN 5 MG TABLET: 5 | 30 days supply | Qty: 60 | Fill #4

## 2017-06-27 MED FILL — FAMOTIDINE 20 MG TABLET: 20 | 30 days supply | Qty: 30 | Fill #6

## 2017-06-27 NOTE — Telephone Encounter (Signed)
Pt aware of response below. States she has an appointment with her urologist/urogynocologist on Wednesday.

## 2017-06-28 ENCOUNTER — Ambulatory Visit (HOSPITAL_COMMUNITY): Payer: Self-pay | Admitting: Licensed Clinical Social Worker

## 2017-06-28 ENCOUNTER — Ambulatory Visit (INDEPENDENT_AMBULATORY_CARE_PROVIDER_SITE_OTHER): Payer: BLUE CROSS/BLUE SHIELD | Admitting: Licensed Clinical Social Worker

## 2017-06-28 DIAGNOSIS — F332 Major depressive disorder, recurrent severe without psychotic features: Secondary | ICD-10-CM

## 2017-06-29 ENCOUNTER — Encounter (HOSPITAL_COMMUNITY): Payer: Self-pay | Admitting: Licensed Clinical Social Worker

## 2017-06-29 DIAGNOSIS — N9489 Other specified conditions associated with female genital organs and menstrual cycle: Secondary | ICD-10-CM | POA: Diagnosis not present

## 2017-06-29 DIAGNOSIS — Z6841 Body Mass Index (BMI) 40.0 and over, adult: Secondary | ICD-10-CM | POA: Diagnosis not present

## 2017-06-29 DIAGNOSIS — F329 Major depressive disorder, single episode, unspecified: Secondary | ICD-10-CM | POA: Diagnosis not present

## 2017-06-29 NOTE — Progress Notes (Signed)
Daily Group Progress Note  Program: OP Evening Group     Group Time: 5:30-6:30pm  Participation Level: Active  Behavioral Response: Appropriate  Type of Therapy:  Psychotherapy/Process   Summary of Progress: Pt returned to group this evening after missing several groups. She apologized to the group and talked about her depression. She will also begin the IOP program next week but will return to the OP evening group after her completion.  Pt participated in a discussion on boundaries. Pt was encouraged to use non-permeable boundaries when dealing with problematic people, including her mother. Pt was receptive to the psychotherapy and suggestions on boundaries.      Ottis Stain, LCAS

## 2017-07-04 ENCOUNTER — Encounter (HOSPITAL_COMMUNITY): Payer: Self-pay | Admitting: Psychiatry

## 2017-07-04 ENCOUNTER — Other Ambulatory Visit (HOSPITAL_COMMUNITY): Payer: BLUE CROSS/BLUE SHIELD | Attending: Psychiatry | Admitting: Psychiatry

## 2017-07-04 DIAGNOSIS — F419 Anxiety disorder, unspecified: Secondary | ICD-10-CM | POA: Insufficient documentation

## 2017-07-04 DIAGNOSIS — F329 Major depressive disorder, single episode, unspecified: Secondary | ICD-10-CM | POA: Diagnosis not present

## 2017-07-04 DIAGNOSIS — F332 Major depressive disorder, recurrent severe without psychotic features: Secondary | ICD-10-CM

## 2017-07-04 NOTE — Progress Notes (Signed)
Carly Delacruz is a 51 y.o., single, female who was referred per therapist Idalia Needle, LCAS); treatment for worsening depressive an anxiety symptoms.  Denies SI/HI or A/V hallucinations.  States her depression has been worsening since 01-05-17.  Stressors:  1)  Medical:  Pt has multiple medical problems including chronic back pain, chronic pain and multiple joints including her feet, history of recent UTI and pyelonephritis resulting in medical admission and acute toxic metabolic encephalopathy in June 2018.  2)  Unresolved grief/loss issues:  She has a 32-year work history at [employer].  Pt  agreed to separation package, given her acute decline in her health conditions and inability to continue working. 3)  Conflictual relationship with elderly mother:  When pt started having problems with feet; her mother moved in to assist her.  "I feel like a child because I can't seem to do anything right.  She's always criticizing me."  Pt states that her mother just recently said that if she (pt) is no better by June; she will be moving out.  4)  Limited support system Pt has never been married, has no children, and reports that she had poured herself into her love of her job and her work. She reports that she feels like she has lost herself, her purpose, and she feels like this is effected her mood substantially.  She reports that she feels down and depressed, hopeless about the world, has difficulty sleeping, feels like her mind will not shut off from her rumination about past events, has low energy, low motivation.  She used to love going to church, but now has been withdrawn from church for the past 2 months.  She denies any alcohol or drug use.  Reports that she has a family history of alcohol use, so she diligently stays away from alcohol except on rare occasions. Siblings:  Sister and a brother Pt scored 29 on the PHQ-9.  Pt voiced concern that she may not be able to attend due to the pain.  A:  Oriented  pt to MH-IOP.  Provided pt with an orientation folder.  Provided pt with support.  Reiterated to pt that there is an attendance policy and if she starts missing past two days, writer would have to discuss other options with her.  F/U with Dr. Rene Kocher and Idalia Needle, LCAS, along with [facility].  R:  Pt receptive.           Chestine Spore, RITA

## 2017-07-05 ENCOUNTER — Other Ambulatory Visit (HOSPITAL_COMMUNITY): Payer: BLUE CROSS/BLUE SHIELD

## 2017-07-05 ENCOUNTER — Ambulatory Visit (HOSPITAL_COMMUNITY): Payer: Self-pay | Admitting: Licensed Clinical Social Worker

## 2017-07-05 NOTE — Progress Notes (Signed)
    Daily Group Progress Note  Program: IOP  Group Time: 9:00-12:00  Participation Level: Active  Behavioral Response: Appropriate  Type of Therapy:  Group Therapy  Summary of Progress: Pt.'s first day of group. Pt. Met with the case manager for intake assessment. Pt. Presented as depressed, but responsive to questions from the therapist and group discussion. Pt. Shared ongoing struggle as a caregiver of her mother who tends to be judgmental of her and her issues with depression and chronic pain.  Pt. Participated in medication management group with the pharmacist. Pt. Shared that she was not having problems with her medication at this time. Pt. Participated in discussion about mindfulness and learning to live in the moment in order to better cope with anxiety and depression.    Nancie Neas, LPC

## 2017-07-06 ENCOUNTER — Other Ambulatory Visit (HOSPITAL_COMMUNITY): Payer: BLUE CROSS/BLUE SHIELD | Admitting: Psychiatry

## 2017-07-06 NOTE — Progress Notes (Signed)
Carly Delacruz is a 52 y.o. , single, female who was referred per therapist Idalia Needle, LCAS); treatment for worsening depressive an anxiety symptoms.  Denies SI/HI or A/V hallucinations.  States her depression has been worsening since 01-05-17.  Stressors:  1)  Medical:  Pt has multiple medical problems including chronic back pain, chronic pain and multiple joints including her feet, history of recent UTI and pyelonephritis resulting in medical admission and acute toxic metabolic encephalopathy in June 2018. 2)  Unresolved grief/loss issues:  She has a 32-year work history at [employer].  Pt  agreed to separation package, given her acute decline in her health conditions and inability to continue working. 3)  Conflictual relationship with elderly mother:  When pt started having problems with feet; her mother moved in to assist her.  "I feel like a child because I can't seem to do anything right.  She's always criticizing me."  Pt states that her mother just recently said that if she (pt) is no better by June; she will be moving out.  4)  Limited support system Pt has never been married, has no children, and reports that she had poured herself into her love of her job and her work.She reports that she feels like she has lost herself, her purpose, and she feels like this is effected her mood substantially. She reports that she feels down and depressed, hopeless about the world, has difficulty sleeping, feels like her mind will not shut off from her rumination about past events, has low energy, low motivation. She used to love going to church, but now has been withdrawn from church for the past 2 months. She denies any alcohol or drug use. Reports that she has a family history of alcohol use, so she diligently stays away from alcohol except on rare occasions. Siblings:  Sister and a brother Pt scored 29 on the PHQ-9.  Pt voiced concern that she may not be able to attend due to the pain.   Pt phoned  and left vm that she wouldn't be returning to IOP due to the groups being too long.  "I was in bed the whole next day after attending the groups on Monday.  It's just too much on me to attend even if it's just three days."  A:  D//C today.  F/U with Dr. Rene Kocher and Idalia Needle, LCAS.  Encouraged pt to return to the evening aftercare group with Idalia Needle, LCAS.  R:  Pt receptive.          Chestine Spore, RITA, M.Ed,CNA

## 2017-07-06 NOTE — Progress Notes (Addendum)
  [facility] Intensive Outpatient Program Discharge Summary  Carly Delacruz [id number]  Admission date: 07/04/2017 Discharge date: 07/06/2017  Reason for admission: [name] was referred to intensive outpatient program for worsening symptoms of depression and anxiety.  Reported that patient has ongoing medial and chronic pain related issues and has been unable to attended daily group sessions.  Reported patient will not be attending program at this time.   Progress in Program Toward Treatment Goals: of noted patient attended one day of  group session and has decided not to continue in the intensive outpatient program.  Progress (rationale): Discharge patient to keep follow-up appointment with MD Rene Kocher and or Therapist McKenzie LCAS    Oneta Rack, NP 07/06/2017

## 2017-07-07 ENCOUNTER — Other Ambulatory Visit (HOSPITAL_COMMUNITY): Payer: BLUE CROSS/BLUE SHIELD

## 2017-07-08 ENCOUNTER — Other Ambulatory Visit (HOSPITAL_COMMUNITY): Payer: BLUE CROSS/BLUE SHIELD

## 2017-07-08 MED FILL — tiZANidine HCL 4 MG TABS: 4 | 30 days supply | Qty: 90 | Fill #1

## 2017-07-11 ENCOUNTER — Other Ambulatory Visit (HOSPITAL_COMMUNITY): Payer: BLUE CROSS/BLUE SHIELD

## 2017-07-11 MED FILL — diazePAM 5 MG TABS: 5 | 16 days supply | Qty: 50 | Fill #0

## 2017-07-12 ENCOUNTER — Ambulatory Visit (HOSPITAL_COMMUNITY): Payer: Self-pay | Admitting: Licensed Clinical Social Worker

## 2017-07-12 ENCOUNTER — Other Ambulatory Visit (HOSPITAL_COMMUNITY): Payer: BLUE CROSS/BLUE SHIELD

## 2017-07-13 ENCOUNTER — Other Ambulatory Visit (HOSPITAL_COMMUNITY): Payer: BLUE CROSS/BLUE SHIELD

## 2017-07-14 ENCOUNTER — Other Ambulatory Visit (HOSPITAL_COMMUNITY): Payer: BLUE CROSS/BLUE SHIELD

## 2017-07-15 ENCOUNTER — Other Ambulatory Visit (HOSPITAL_COMMUNITY): Payer: BLUE CROSS/BLUE SHIELD

## 2017-07-18 ENCOUNTER — Other Ambulatory Visit (HOSPITAL_COMMUNITY): Payer: BLUE CROSS/BLUE SHIELD

## 2017-07-19 ENCOUNTER — Ambulatory Visit (HOSPITAL_COMMUNITY): Payer: Self-pay | Admitting: Licensed Clinical Social Worker

## 2017-07-19 ENCOUNTER — Other Ambulatory Visit (HOSPITAL_COMMUNITY): Payer: BLUE CROSS/BLUE SHIELD

## 2017-07-20 ENCOUNTER — Other Ambulatory Visit (HOSPITAL_COMMUNITY): Payer: BLUE CROSS/BLUE SHIELD

## 2017-07-20 DIAGNOSIS — N3281 Overactive bladder: Secondary | ICD-10-CM | POA: Diagnosis not present

## 2017-07-20 DIAGNOSIS — N398 Other specified disorders of urinary system: Secondary | ICD-10-CM | POA: Diagnosis not present

## 2017-07-20 DIAGNOSIS — N9489 Other specified conditions associated with female genital organs and menstrual cycle: Secondary | ICD-10-CM | POA: Diagnosis not present

## 2017-07-20 DIAGNOSIS — B373 Candidiasis of vulva and vagina: Secondary | ICD-10-CM | POA: Diagnosis not present

## 2017-07-20 DIAGNOSIS — F329 Major depressive disorder, single episode, unspecified: Secondary | ICD-10-CM | POA: Diagnosis not present

## 2017-07-20 MED FILL — FLUCONAZOLE 150 MG TABS: 150 | 1 days supply | Qty: 1 | Fill #0

## 2017-07-21 ENCOUNTER — Other Ambulatory Visit (HOSPITAL_COMMUNITY): Payer: BLUE CROSS/BLUE SHIELD

## 2017-07-22 ENCOUNTER — Other Ambulatory Visit (HOSPITAL_COMMUNITY): Payer: BLUE CROSS/BLUE SHIELD

## 2017-07-26 ENCOUNTER — Other Ambulatory Visit (HOSPITAL_COMMUNITY): Payer: BLUE CROSS/BLUE SHIELD

## 2017-07-27 ENCOUNTER — Other Ambulatory Visit (HOSPITAL_COMMUNITY): Payer: BLUE CROSS/BLUE SHIELD

## 2017-07-27 DIAGNOSIS — G894 Chronic pain syndrome: Secondary | ICD-10-CM | POA: Diagnosis not present

## 2017-07-27 DIAGNOSIS — M461 Sacroiliitis, not elsewhere classified: Secondary | ICD-10-CM | POA: Diagnosis not present

## 2017-07-27 DIAGNOSIS — G4700 Insomnia, unspecified: Secondary | ICD-10-CM | POA: Diagnosis not present

## 2017-07-27 DIAGNOSIS — M47816 Spondylosis without myelopathy or radiculopathy, lumbar region: Secondary | ICD-10-CM | POA: Diagnosis not present

## 2017-07-27 MED FILL — FAMOTIDINE 20 MG TABLET: 20 | 30 days supply | Qty: 30 | Fill #0

## 2017-07-27 MED FILL — GABAPENTIN 300 MG CAPSULE: 300 | 30 days supply | Qty: 60 | Fill #0

## 2017-07-27 MED FILL — NUCYNTA 50 MG TABLET: 50 | 30 days supply | Qty: 90 | Fill #0

## 2017-07-28 ENCOUNTER — Ambulatory Visit (INDEPENDENT_AMBULATORY_CARE_PROVIDER_SITE_OTHER): Payer: BLUE CROSS/BLUE SHIELD | Admitting: Psychiatry

## 2017-07-28 ENCOUNTER — Encounter (HOSPITAL_COMMUNITY): Payer: Self-pay | Admitting: Psychiatry

## 2017-07-28 DIAGNOSIS — F332 Major depressive disorder, recurrent severe without psychotic features: Secondary | ICD-10-CM | POA: Diagnosis not present

## 2017-07-28 DIAGNOSIS — F411 Generalized anxiety disorder: Secondary | ICD-10-CM

## 2017-07-28 DIAGNOSIS — F4312 Post-traumatic stress disorder, chronic: Secondary | ICD-10-CM | POA: Diagnosis not present

## 2017-07-28 MED ORDER — AMITRIPTYLINE HCL 100 MG PO TABS: 100.0000 mg | ORAL_TABLET | Freq: Every day | ORAL | 0 refills | Status: DC

## 2017-07-28 MED ORDER — FLUOXETINE HCL 40 MG PO CAPS: 40.0000 mg | ORAL_CAPSULE | Freq: Every day | ORAL | 0 refills | Status: DC

## 2017-07-28 MED FILL — AMITRIPTYLINE HCL 100 MG TA: 100 | 90 days supply | Qty: 90 | Fill #0

## 2017-07-28 NOTE — Progress Notes (Signed)
BH MD/PA/NP OP Progress Note  07/28/2017 10:44 AM Carly Delacruz  MRN:  [id number]  Chief Complaint: med management  HPI: Carly Delacruz reports she is doing really well overall and is starting to feel more like herself.  She is up to amitriptyline 75 mg nightly for the past 3-4 weeks and has tolerated it well.  She has had a little bit of balance issues but no falls.  She continues to work on her ambulation and walks with a cane.  No homicidal or anger thoughts towards her mother.  Reports that her mood is doing much better, she is participating in church activities.   We agreed to increase amitriptyline to 100 mg and continue the current dose of Prozac.  She is going to be seeing gastroenterology in a few weeks and discuss her concerns about ongoing diarrhea which do not appear to be related to the dose increases in amitriptyline.  Visit Diagnosis:    ICD-10-CM   1. Severe episode of recurrent major depressive disorder, without psychotic features (HCC) F33.2 amitriptyline (ELAVIL) 100 MG tablet    FLUoxetine (PROZAC) 40 MG capsule  2. Chronic post-traumatic stress disorder (PTSD) F43.12 amitriptyline (ELAVIL) 100 MG tablet    FLUoxetine (PROZAC) 40 MG capsule  3. GAD (generalized anxiety disorder) F41.1 amitriptyline (ELAVIL) 100 MG tablet    FLUoxetine (PROZAC) 40 MG capsule   Past Psychiatric History: See intake H&P for full details. Reviewed, with no updates at this time.  Past Medical History:  Past Medical History:  Diagnosis Date  . Allergy   . Anemia   . Anxiety   . Arthritis 12/10/03  . Atrophic vaginitis 2009  . BV (bacterial vaginosis) 2006  . Chronic back pain   . Chronic LLQ pain   . Complication of anesthesia   . Elevated LFTs   . Endometriosis   . Fatigue 2012  . Gallstones   . GERD (gastroesophageal reflux disease)   . H/O bladder infections   . H/O mumps   . H/O varicella   . History of candidal vulvovaginitis 03/21/03  . HTN (hypertension)   .  Insomnia   . Insomnia secondary to situational depression 06/06/07  . Liver mass, right lobe 08/13/04   Nodule  . Malaise 2012  . Morbid obesity (HCC) 2005  . Osteoporosis   . S/P gastric bypass   . Sleep apnea 2005  . SUI (stress urinary incontinence, female) 08/12/03  . Trichomonas   . Vulvar lesion 03/21/03  . Yeast infection     Past Surgical History:  Procedure Laterality Date  . ABDOMINAL HYSTERECTOMY  2001  . APPENDECTOMY  2001  . CHOLECYSTECTOMY  2011  . DILATION AND CURETTAGE OF UTERUS    . GASTRIC BYPASS  2007   [facility], Dr. Daphine Deutscher  . KNEE ARTHROSCOPY Left   . LAPAROTOMY    . TONSILLECTOMY    . WISDOM TOOTH EXTRACTION      Family Psychiatric History: See intake H&P for full details. Reviewed, with no updates at this time.   Family History:  Family History  Problem Relation Age of Onset  . Diabetes Paternal Grandmother   . Leukemia Maternal Grandmother   . Arthritis Mother   . Prostate cancer Father   . Colitis Sister     Social History:  Social History   Socioeconomic History  . Marital status: Single    Spouse name: Not on file  . Number of children: 0  . Years of education: Not on  file  . Highest education level: Not on file  Occupational History  . Occupation: Therapist, nutritional: [employer]  Social Needs  . Financial resource strain: Not on file  . Food insecurity:    Worry: Not on file    Inability: Not on file  . Transportation needs:    Medical: Not on file    Non-medical: Not on file  Tobacco Use  . Smoking status: Never Smoker  . Smokeless tobacco: Never Used  Substance and Sexual Activity  . Alcohol use: No  . Drug use: No  . Sexual activity: Never    Birth control/protection: Surgical    Comment: hysterectomy   Lifestyle  . Physical activity:    Days per week: Not on file    Minutes per session: Not on file  . Stress: Not on file  Relationships  . Social connections:    Talks on phone: Not  on file    Gets together: Not on file    Attends religious service: Not on file    Active member of club or organization: Not on file    Attends meetings of clubs or organizations: Not on file    Relationship status: Not on file  Other Topics Concern  . Not on file  Social History Narrative  . Not on file    Allergies:  Allergies  Allergen Reactions  . Morphine And Related Shortness Of Breath and Itching  . Septra [Sulfamethoxazole-Trimethoprim] Anaphylaxis and Hives  . Sulfa Antibiotics Anaphylaxis, Hives and Swelling    Throat swelling  . Sulfamethoxazole-Trimethoprim Anaphylaxis and Hives  . Sulfasalazine Anaphylaxis, Hives, Swelling and Other (See Comments)    Throat swelling  . Ultram [Tramadol] Shortness Of Breath, Anxiety and Other (See Comments)    "panic attacks"   . Ciprofloxacin Other (See Comments)    This medication was given along with a sulfa. Reaction is unknown at this time. Must list due to possible allergy.  Leodis [place] [Propoxyphene N-Acetaminophen] Itching  . Adhesive [Tape] Rash  . Codeine Itching and Other (See Comments)    No change when benadryl is given  . Demerol [Meperidine] Nausea Only  . Hydromorphone Itching and Other (See Comments)    No change when benadryl is given  . Losartan Palpitations    Metabolic Disorder Labs: No results found for: HGBA1C, MPG No results found for: PROLACTIN No results found for: CHOL, TRIG, HDL, CHOLHDL, VLDL, LDLCALC Lab Results  Component Value Date   TSH 0.71 05/02/2017   TSH 1.310 04/29/2016    Therapeutic Level Labs: No results found for: LITHIUM No results found for: VALPROATE No components found for:  CBMZ  Current Medications: Current Outpatient Medications  Medication Sig Dispense Refill  . acetaminophen (TYLENOL) 500 MG tablet Take 500 mg by mouth every 6 (six) hours as needed.    Marland Kitchen amitriptyline (ELAVIL) 100 MG tablet Take 1 tablet (100 mg total) by mouth at bedtime. 90 tablet 0  . amLODipine  (NORVASC) 10 MG tablet Take 10 mg by mouth daily.     . diazepam (VALIUM) 5 MG tablet Place 5 mg vaginally every 8 (eight) hours as needed (for pelvic floor spasms).     . famotidine (PEPCID) 20 MG tablet Take 20 mg by mouth 2 (two) times daily.    Marland Kitchen FeFum-FePoly-FA-B Cmp-C-Biot (FOLIVANE-PLUS) CAPS Take by mouth.    . fluconazole (DIFLUCAN) 100 MG tablet Take 100 mg by mouth daily.    Marland Kitchen FLUoxetine (PROZAC) 40 MG capsule  Take 1 capsule (40 mg total) by mouth daily. 90 capsule 0  . gabapentin (NEURONTIN) 300 MG capsule Take 1 capsule (300 mg total) by mouth 2 (two) times daily.    Marland Kitchen OVER THE COUNTER MEDICATION Ultra joint complex vitamin    . oxybutynin (DITROPAN) 5 MG tablet Take 1 tablet (5 mg total) by mouth 2 (two) times daily. 30 tablet 0  . tapentadol (NUCYNTA) 50 MG tablet Take 1 tablet (50 mg total) by mouth every 12 (twelve) hours as needed for moderate pain. (Patient taking differently: Take 50 mg by mouth 3 (three) times daily. )    . tiZANidine (ZANAFLEX) 4 MG tablet Take 4 mg by mouth 3 (three) times daily as needed for muscle spasms.     . Zinc 50 MG CAPS Take 50 mg by mouth.     No current facility-administered medications for this visit.     Musculoskeletal: Strength & Muscle Tone: within normal limits Gait & Station: normal Patient leans: N/A  Psychiatric Specialty Exam: ROS  Blood pressure (!) 152/100, pulse 90, height  (1.6 m), weight 240 lb 9.6 oz (109.1 kg).Body mass index is 42.62 kg/m.  General Appearance: Casual and Well Groomed  Eye Contact:  Good  Speech:  Clear and Coherent and Normal Rate  Volume:  Normal  Mood:  Euthymic  Affect:  Appropriate and Congruent  Thought Process:  Goal Directed and Descriptions of Associations: Intact  Orientation:  Full (Time, Place, and Person)  Thought Content: Logical   Suicidal Thoughts:  No  Homicidal Thoughts:  No  Memory:  Immediate;   Good  Judgement:  Good  Insight:  Good  Psychomotor Activity:  Normal   Concentration:  Attention Span: Good  Recall:  Good  Fund of Knowledge: Good  Language: Good  Akathisia:  Negative  Handed:  Right  AIMS (if indicated): not done  Assets:  Communication Skills Desire for Improvement Financial Resources/Insurance Housing  ADL's:  Intact  Cognition: WNL  Sleep:  Good   Screenings: PHQ2-9     Patient Outreach Telephone from 04/28/2016 in [facility]  PHQ-2 Total Score  6  PHQ-9 Total Score  12       Assessment and Plan:  Carly Delacruz presents with significant gradual improvement of her mood and depressive symptoms.  Much of this is also been related to her increased behavioral activation, participation in church, and ongoing setting of positive goals.  No acute safety issues or homicidal or anger thoughts towards her mother.  She is sleeping better with the amitriptyline but we have some room to go to help her have full improvement of sleep and pain symptoms.  We will follow-up in 6-8 weeks and she understands writer is transitioning out of office at the end of August.  1. Severe episode of recurrent major depressive disorder, without psychotic features (HCC)   2. Chronic post-traumatic stress disorder (PTSD)   3. GAD (generalized anxiety disorder)     Status of current problems: gradually improving  Labs Ordered: No orders of the defined types were placed in this encounter.   Labs Reviewed: n/a  Collateral Obtained/Records Reviewed: n/a  Plan:  Prozac 40 mg daily Elavil 100 mg nightly rtc 6-8 weeks  Burnard Leigh, MD 07/28/2017, 10:44 AM

## 2017-07-29 ENCOUNTER — Other Ambulatory Visit (HOSPITAL_COMMUNITY): Payer: BLUE CROSS/BLUE SHIELD

## 2017-08-01 ENCOUNTER — Other Ambulatory Visit (HOSPITAL_COMMUNITY): Payer: BLUE CROSS/BLUE SHIELD

## 2017-08-02 ENCOUNTER — Other Ambulatory Visit (HOSPITAL_COMMUNITY): Payer: BLUE CROSS/BLUE SHIELD

## 2017-08-03 ENCOUNTER — Other Ambulatory Visit (HOSPITAL_COMMUNITY): Payer: BLUE CROSS/BLUE SHIELD

## 2017-08-03 DIAGNOSIS — M7062 Trochanteric bursitis, left hip: Secondary | ICD-10-CM | POA: Diagnosis not present

## 2017-08-03 DIAGNOSIS — M7061 Trochanteric bursitis, right hip: Secondary | ICD-10-CM | POA: Diagnosis not present

## 2017-08-03 MED FILL — tiZANidine HCL 4 MG TABS: 4 | 30 days supply | Qty: 90 | Fill #2

## 2017-08-04 ENCOUNTER — Ambulatory Visit (INDEPENDENT_AMBULATORY_CARE_PROVIDER_SITE_OTHER): Payer: BLUE CROSS/BLUE SHIELD | Admitting: Nurse Practitioner

## 2017-08-04 ENCOUNTER — Encounter: Payer: Self-pay | Admitting: Nurse Practitioner

## 2017-08-04 ENCOUNTER — Other Ambulatory Visit (INDEPENDENT_AMBULATORY_CARE_PROVIDER_SITE_OTHER): Payer: BLUE CROSS/BLUE SHIELD

## 2017-08-04 VITALS — BP 118/80 | HR 100 | Temp 98.3°F | Resp 16 | Ht 63.0 in | Wt 246.0 lb

## 2017-08-04 DIAGNOSIS — Z9189 Other specified personal risk factors, not elsewhere classified: Secondary | ICD-10-CM

## 2017-08-04 DIAGNOSIS — Z23 Encounter for immunization: Secondary | ICD-10-CM

## 2017-08-04 DIAGNOSIS — R7309 Other abnormal glucose: Secondary | ICD-10-CM

## 2017-08-04 DIAGNOSIS — Z0001 Encounter for general adult medical examination with abnormal findings: Secondary | ICD-10-CM

## 2017-08-04 DIAGNOSIS — Z1159 Encounter for screening for other viral diseases: Secondary | ICD-10-CM | POA: Diagnosis not present

## 2017-08-04 DIAGNOSIS — I10 Essential (primary) hypertension: Secondary | ICD-10-CM

## 2017-08-04 DIAGNOSIS — Z13220 Encounter for screening for lipoid disorders: Secondary | ICD-10-CM | POA: Diagnosis not present

## 2017-08-04 LAB — LIPID PANEL
CHOL/HDL RATIO: 3
Cholesterol: 208 mg/dL — ABNORMAL HIGH (ref 0–200)
HDL: 69.7 mg/dL (ref >39.00)
LDL CALC: 122 mg/dL — AB (ref 0–99)
NONHDL: 138.72
TRIGLYCERIDES: 83 mg/dL (ref 0.0–149.0)
VLDL: 16.6 mg/dL (ref 0.0–40.0)

## 2017-08-04 LAB — HEMOGLOBIN A1C: Hgb A1c MFr Bld: 5.8 % (ref 4.6–6.5)

## 2017-08-04 NOTE — Patient Instructions (Addendum)
Please head downstairs for lab work/x-rays. If any of your test results are critically abnormal, you will be contacted right away. Your results may be released to your MyChart for viewing before I am able to provide you with my response. I will contact you within a week about your test results and any recommendations for abnormalities.  Ill plan to see you back in 1 year for your annual physical, or sooner if you need me.  Health Maintenance, Female Adopting a healthy lifestyle and getting preventive care can go a long way to promote health and wellness. Talk with your health care provider about what schedule of regular examinations is right for you. This is a good chance for you to check in with your provider about disease prevention and staying healthy. In between checkups, there are plenty of things you can do on your own. Experts have done a lot of research about which lifestyle changes and preventive measures are most likely to keep you healthy. Ask your health care provider for more information. Weight and diet Eat a healthy diet  Be sure to include plenty of vegetables, fruits, low-fat dairy products, and lean protein.  Do not eat a lot of foods high in solid fats, added sugars, or salt.  Get regular exercise. This is one of the most important things you can do for your health. ? Most adults should exercise for at least 150 minutes each week. The exercise should increase your heart rate and make you sweat (moderate-intensity exercise). ? Most adults should also do strengthening exercises at least twice a week. This is in addition to the moderate-intensity exercise.  Maintain a healthy weight  Body mass index (BMI) is a measurement that can be used to identify possible weight problems. It estimates body fat based on height and weight. Your health care provider can help determine your BMI and help you achieve or maintain a healthy weight.  For females 39 years of age and older: ? A BMI  below 18.5 is considered underweight. ? A BMI of 18.5 to 24.9 is normal. ? A BMI of 25 to 29.9 is considered overweight. ? A BMI of 30 and above is considered obese.  Watch levels of cholesterol and blood lipids  You should start having your blood tested for lipids and cholesterol at 54 years of age, then have this test every 5 years.  You may need to have your cholesterol levels checked more often if: ? Your lipid or cholesterol levels are high. ? You are older than 54 years of age. ? You are at high risk for heart disease.  Cancer screening Lung Cancer  Lung cancer screening is recommended for adults 53-33 years old who are at high risk for lung cancer because of a history of smoking.  A yearly low-dose CT scan of the lungs is recommended for people who: ? Currently smoke. ? Have quit within the past 15 years. ? Have at least a 30-pack-year history of smoking. A pack year is smoking an average of one pack of cigarettes a day for 1 year.  Yearly screening should continue until it has been 15 years since you quit.  Yearly screening should stop if you develop a health problem that would prevent you from having lung cancer treatment.  Breast Cancer  Practice breast self-awareness. This means understanding how your breasts normally appear and feel.  It also means doing regular breast self-exams. Let your health care provider know about any changes, no matter how small.  If you are in your 20s or 30s, you should have a clinical breast exam (CBE) by a health care provider every 1-3 years as part of a regular health exam.  If you are 81 or older, have a CBE every year. Also consider having a breast X-ray (mammogram) every year.  If you have a family history of breast cancer, talk to your health care provider about genetic screening.  If you are at high risk for breast cancer, talk to your health care provider about having an MRI and a mammogram every year.  Breast cancer gene  (BRCA) assessment is recommended for women who have family members with BRCA-related cancers. BRCA-related cancers include: ? Breast. ? Ovarian. ? Tubal. ? Peritoneal cancers.  Results of the assessment will determine the need for genetic counseling and BRCA1 and BRCA2 testing.  Cervical Cancer Your health care provider may recommend that you be screened regularly for cancer of the pelvic organs (ovaries, uterus, and vagina). This screening involves a pelvic examination, including checking for microscopic changes to the surface of your cervix (Pap test). You may be encouraged to have this screening done every 3 years, beginning at age 82.  For women ages 103-65, health care providers may recommend pelvic exams and Pap testing every 3 years, or they may recommend the Pap and pelvic exam, combined with testing for human papilloma virus (HPV), every 5 years. Some types of HPV increase your risk of cervical cancer. Testing for HPV may also be done on women of any age with unclear Pap test results.  Other health care providers may not recommend any screening for nonpregnant women who are considered low risk for pelvic cancer and who do not have symptoms. Ask your health care provider if a screening pelvic exam is right for you.  If you have had past treatment for cervical cancer or a condition that could lead to cancer, you need Pap tests and screening for cancer for at least 20 years after your treatment. If Pap tests have been discontinued, your risk factors (such as having a new sexual partner) need to be reassessed to determine if screening should resume. Some women have medical problems that increase the chance of getting cervical cancer. In these cases, your health care provider may recommend more frequent screening and Pap tests.  Colorectal Cancer  This type of cancer can be detected and often prevented.  Routine colorectal cancer screening usually begins at 54 years of age and continues  through 54 years of age.  Your health care provider may recommend screening at an earlier age if you have risk factors for colon cancer.  Your health care provider may also recommend using home test kits to check for hidden blood in the stool.  A small camera at the end of a tube can be used to examine your colon directly (sigmoidoscopy or colonoscopy). This is done to check for the earliest forms of colorectal cancer.  Routine screening usually begins at age 14.  Direct examination of the colon should be repeated every 5-10 years through 54 years of age. However, you may need to be screened more often if early forms of precancerous polyps or small growths are found.  Skin Cancer  Check your skin from head to toe regularly.  Tell your health care provider about any new moles or changes in moles, especially if there is a change in a mole's shape or color.  Also tell your health care provider if you have a mole that is  larger than the size of a pencil eraser.  Always use sunscreen. Apply sunscreen liberally and repeatedly throughout the day.  Protect yourself by wearing long sleeves, pants, a wide-brimmed hat, and sunglasses whenever you are outside.  Heart disease, diabetes, and high blood pressure  High blood pressure causes heart disease and increases the risk of stroke. High blood pressure is more likely to develop in: ? People who have blood pressure in the high end of the normal range (130-139/85-89 mm Hg). ? People who are overweight or obese. ? People who are African American.  If you are 74-54 years of age, have your blood pressure checked every 3-5 years. If you are 26 years of age or older, have your blood pressure checked every year. You should have your blood pressure measured twice-once when you are at a hospital or clinic, and once when you are not at a hospital or clinic. Record the average of the two measurements. To check your blood pressure when you are not at a  hospital or clinic, you can use: ? An automated blood pressure machine at a pharmacy. ? A home blood pressure monitor.  If you are between 37 years and 6 years old, ask your health care provider if you should take aspirin to prevent strokes.  Have regular diabetes screenings. This involves taking a blood sample to check your fasting blood sugar level. ? If you are at a normal weight and have a low risk for diabetes, have this test once every three years after 54 years of age. ? If you are overweight and have a high risk for diabetes, consider being tested at a younger age or more often. Preventing infection Hepatitis B  If you have a higher risk for hepatitis B, you should be screened for this virus. You are considered at high risk for hepatitis B if: ? You were born in a country where hepatitis B is common. Ask your health care provider which countries are considered high risk. ? Your parents were born in a high-risk country, and you have not been immunized against hepatitis B (hepatitis B vaccine). ? You have HIV or AIDS. ? You use needles to inject street drugs. ? You live with someone who has hepatitis B. ? You have had sex with someone who has hepatitis B. ? You get hemodialysis treatment. ? You take certain medicines for conditions, including cancer, organ transplantation, and autoimmune conditions.  Hepatitis C  Blood testing is recommended for: ? Everyone born from [birth year] through 1967. ? Anyone with known risk factors for hepatitis C.  Sexually transmitted infections (STIs)  You should be screened for sexually transmitted infections (STIs) including gonorrhea and chlamydia if: ? You are sexually active and are younger than 54 years of age. ? You are older than 54 years of age and your health care provider tells you that you are at risk for this type of infection. ? Your sexual activity has changed since you were last screened and you are at an increased risk for chlamydia or  gonorrhea. Ask your health care provider if you are at risk.  If you do not have HIV, but are at risk, it may be recommended that you take a prescription medicine daily to prevent HIV infection. This is called pre-exposure prophylaxis (PrEP). You are considered at risk if: ? You are sexually active and do not regularly use condoms or know the HIV status of your partner(s). ? You take drugs by injection. ? You are sexually active  with a partner who has HIV.  Talk with your health care provider about whether you are at high risk of being infected with HIV. If you choose to begin PrEP, you should first be tested for HIV. You should then be tested every 3 months for as long as you are taking PrEP. Pregnancy  If you are premenopausal and you may become pregnant, ask your health care provider about preconception counseling.  If you may become pregnant, take 400 to 800 micrograms (mcg) of folic acid every day.  If you want to prevent pregnancy, talk to your health care provider about birth control (contraception). Osteoporosis and menopause  Osteoporosis is a disease in which the bones lose minerals and strength with aging. This can result in serious bone fractures. Your risk for osteoporosis can be identified using a bone density scan.  If you are 87 years of age or older, or if you are at risk for osteoporosis and fractures, ask your health care provider if you should be screened.  Ask your health care provider whether you should take a calcium or vitamin D supplement to lower your risk for osteoporosis.  Menopause may have certain physical symptoms and risks.  Hormone replacement therapy may reduce some of these symptoms and risks. Talk to your health care provider about whether hormone replacement therapy is right for you. Follow these instructions at home:  Schedule regular health, dental, and eye exams.  Stay current with your immunizations.  Do not use any tobacco products including  cigarettes, chewing tobacco, or electronic cigarettes.  If you are pregnant, do not drink alcohol.  If you are breastfeeding, limit how much and how often you drink alcohol.  Limit alcohol intake to no more than 1 drink per day for nonpregnant women. One drink equals 12 ounces of beer, 5 ounces of wine, or 1 ounces of hard liquor.  Do not use street drugs.  Do not share needles.  Ask your health care provider for help if you need support or information about quitting drugs.  Tell your health care provider if you often feel depressed.  Tell your health care provider if you have ever been abused or do not feel safe at home. This information is not intended to replace advice given to you by your health care provider. Make sure you discuss any questions you have with your health care provider. Document Released: 08/31/2010 Document Revised: 07/24/2015 Document Reviewed: 11/19/2014 Elsevier Interactive Patient Education  [employer].

## 2017-08-04 NOTE — Progress Notes (Signed)
Name: Carly Delacruz   MRN: [id number]    DOB: 12/18/1965   Date:08/04/2017       Progress Note  Subjective  Chief Complaint  Chief Complaint  Patient presents with  . CPE    fasting    HPI  Patient presents for annual CPE.  Diet and exercise: not watching her diet, no routine exercise- recent injections for leg pain by orthopedics, upcoming referral to GI for bowel changes  USPSTF grade A and B recommendations  Depression: followed by psychiatry [facility]  Decreased Interest 3  Down, Depressed, Hopeless 3  PHQ - 2 Score 6  Altered sleeping 0  Tired, decreased energy 3  Change in appetite 1  Feeling bad or failure about yourself  1  Trouble concentrating 0  Moving slowly or fidgety/restless 1  Suicidal thoughts 0  PHQ-9 Score 12  Difficult doing work/chores Somewhat difficult   Hypertension: maintained on amlodipine 10 Reports daily medication compliance with no noted adverse effects BP Readings from Last 3 Encounters:  08/04/17 118/80  05/02/17 140/86  08/16/16 (!) 149/65   Obesity: Wt Readings from Last 3 Encounters:  08/04/17 246 lb (111.6 kg)  05/02/17 236 lb (107 kg)  08/12/16 213 lb 8 oz (96.8 kg)   BMI Readings from Last 3 Encounters:  08/04/17 43.58 kg/m  05/02/17 41.81 kg/m  08/12/16 41.01 kg/m    Alcohol: no Tobacco use: no, never  HIV, hep C: HIV screening done, hep c screening ordered today STD testing and prevention (chl/gon/syphilis): declines screening, no concerns  Intimate partner violence: denies, feels safe  Vaccinations: TDAP today  Advanced Care Planning: A voluntary discussion about advance care planning including the explanation and discussion of advance directives.  Discussed health care proxy and Living will, and the patient DOES NOT have a living will at present time. If patient does have living will, I have requested they bring this to the clinic to be scanned in to their chart.  Breast cancer:  mammogram up to date by GYN per patient Cervical cancer screening: followed by GYN Urinary incontinence: Followed routinely by urogyn for pelvic floor dysfunction and OAB  Lipids: lipid panel today No results found for: CHOL No results found for: HDL No results found for: LDLCALC No results found for: TRIG No results found for: CHOLHDL No results found for: LDLDIRECT  Glucose: a1c today Glucose, Bld  Date Value Ref Range Status  05/02/2017 95 70 - 99 mg/dL Final  40/98/119106/18/2018 92 65 - 99 mg/dL Final  47/82/956206/17/2018 130111 (H) 65 - 99 mg/dL Final    Skin cancer: does not wear sunscreen Colorectal cancer: colonoscopy up to date  Aspirin: not indicated ECG: not indicated   Patient Active Problem List   Diagnosis Date Noted  . Depression 05/08/2017  . Glaucoma 05/02/2017  . HTN (hypertension) 08/12/2016  . Chronic back pain 08/12/2016  . Anemia 08/12/2016  . H/O bladder infections 08/12/2016  . S/P gastric bypass 08/12/2016  . Polypharmacy   . Sleep-disordered breathing 07/26/2014  . GERD (gastroesophageal reflux disease) 06/15/2014  . Neuropathy 06/15/2014  . Essential hypertension 06/15/2014  . Obesity 05/08/2013  . Morbid obesity (HCC) 03/02/2003    Past Surgical History:  Procedure Laterality Date  . ABDOMINAL HYSTERECTOMY  2001  . APPENDECTOMY  2001  . CHOLECYSTECTOMY  2011  . DILATION AND CURETTAGE OF UTERUS    . GASTRIC BYPASS  2007   [facility], Dr. Daphine DeutscherMartin  . KNEE ARTHROSCOPY Left   .  LAPAROTOMY    . TONSILLECTOMY    . WISDOM TOOTH EXTRACTION      Family History  Problem Relation Age of Onset  . Diabetes Paternal Grandmother   . Leukemia Maternal Grandmother   . Arthritis Mother   . Prostate cancer Father   . Colitis Sister     Social History   Socioeconomic History  . Marital status: Single    Spouse name: Not on file  . Number of children: 0  . Years of education: Not on file  . Highest education level: Not on file  Occupational  History  . Occupation: Therapist, nutritional: [employer]  Social Needs  . Financial resource strain: Not on file  . Food insecurity:    Worry: Not on file    Inability: Not on file  . Transportation needs:    Medical: Not on file    Non-medical: Not on file  Tobacco Use  . Smoking status: Never Smoker  . Smokeless tobacco: Never Used  Substance and Sexual Activity  . Alcohol use: No  . Drug use: No  . Sexual activity: Never    Birth control/protection: Surgical    Comment: hysterectomy   Lifestyle  . Physical activity:    Days per week: Not on file    Minutes per session: Not on file  . Stress: Not on file  Relationships  . Social connections:    Talks on phone: Not on file    Gets together: Not on file    Attends religious service: Not on file    Active member of club or organization: Not on file    Attends meetings of clubs or organizations: Not on file    Relationship status: Not on file  . Intimate partner violence:    Fear of current or ex partner: Not on file    Emotionally abused: Not on file    Physically abused: Not on file    Forced sexual activity: Not on file  Other Topics Concern  . Not on file  Social History Narrative  . Not on file     Current Outpatient Medications:  .  acetaminophen (TYLENOL) 500 MG tablet, Take 500 mg by mouth every 6 (six) hours as needed., Disp: , Rfl:  .  amitriptyline (ELAVIL) 100 MG tablet, Take 1 tablet (100 mg total) by mouth at bedtime., Disp: 90 tablet, Rfl: 0 .  amLODipine (NORVASC) 10 MG tablet, Take 10 mg by mouth daily. , Disp: , Rfl:  .  diazepam (VALIUM) 5 MG tablet, Place 5 mg vaginally every 8 (eight) hours as needed (for pelvic floor spasms). , Disp: , Rfl:  .  famotidine (PEPCID) 20 MG tablet, Take 20 mg by mouth 2 (two) times daily., Disp: , Rfl:  .  FeFum-FePoly-FA-B Cmp-C-Biot (FOLIVANE-PLUS) CAPS, Take by mouth., Disp: , Rfl:  .  fluconazole (DIFLUCAN) 100 MG tablet, Take 100 mg by mouth  daily., Disp: , Rfl:  .  FLUoxetine (PROZAC) 40 MG capsule, Take 1 capsule (40 mg total) by mouth daily., Disp: 90 capsule, Rfl: 0 .  gabapentin (NEURONTIN) 300 MG capsule, Take 1 capsule (300 mg total) by mouth 2 (two) times daily., Disp: , Rfl:  .  OVER THE COUNTER MEDICATION, Ultra joint complex vitamin, Disp: , Rfl:  .  tapentadol (NUCYNTA) 50 MG tablet, Take 1 tablet (50 mg total) by mouth every 12 (twelve) hours as needed for moderate pain. (Patient taking differently: Take 50 mg by mouth 3 (three)  times daily. ), Disp: , Rfl:  .  tiZANidine (ZANAFLEX) 4 MG tablet, Take 4 mg by mouth 3 (three) times daily as needed for muscle spasms. , Disp: , Rfl:  .  Zinc 50 MG CAPS, Take 50 mg by mouth., Disp: , Rfl:   Allergies  Allergen Reactions  . Morphine And Related Shortness Of Breath and Itching  . Septra [Sulfamethoxazole-Trimethoprim] Anaphylaxis and Hives  . Sulfa Antibiotics Anaphylaxis, Hives and Swelling    Throat swelling  . Sulfamethoxazole-Trimethoprim Anaphylaxis and Hives  . Sulfasalazine Anaphylaxis, Hives, Swelling and Other (See Comments)    Throat swelling  . Ultram [Tramadol] Shortness Of Breath, Anxiety and Other (See Comments)    "panic attacks"   . Ciprofloxacin Other (See Comments)    This medication was given along with a sulfa. Reaction is unknown at this time. Must list due to possible allergy.  Leodis [place] [Propoxyphene N-Acetaminophen] Itching  . Adhesive [Tape] Rash  . Codeine Itching and Other (See Comments)    No change when benadryl is given  . Demerol [Meperidine] Nausea Only  . Hydromorphone Itching and Other (See Comments)    No change when benadryl is given  . Losartan Palpitations     ROS  Constitutional: Negative for fever or weight change.  Respiratory: Negative for cough and shortness of breath.   Cardiovascular: Negative for chest pain or palpitations.  Gastrointestinal: Negative for abdominal pain. Positive for bowel changes.   Musculoskeletal: Positive for joint pain.  Skin: Negative for rash.  Neurological: Negative for dizziness or headache.  No other specific complaints in a complete review of systems (except as listed in HPI above).   Objective  Vitals:   08/04/17 0953  BP: 118/80  Pulse: 100  Resp: 16  Temp: 98.3 F (36.8 C)  TempSrc: Oral  SpO2: 95%  Weight: 246 lb (111.6 kg)  Height: 5\' 3"  (1.6 m)    Body mass index is 43.58 kg/m.  Physical Exam Vital signs reviewed Constitutional: Patient appears well-developed and well-nourished. No distress. Ambulatory with walker. HENT: Head: Normocephalic and atraumatic. Ears: B TMs ok, no erythema or effusion; Nose: Nose normal. Mouth/Throat: Oropharynx is clear and moist. No oropharyngeal exudate.  Eyes: Conjunctivae and EOM are normal. Pupils are equal, round, and reactive to light. No scleral icterus.  Neck: Normal range of motion. Neck supple. No cervical adenopathy. No thyromegaly present.  Cardiovascular: Normal rate, regular rhythm and normal heart sounds. No BLE edema. Distal pulses intact Pulmonary/Chest: Effort normal and breath sounds normal. No respiratory distress. Abdominal: Soft. Bowel sounds are normal, no distension. No masses Breast: defd to GYN FEMALE GENITALIA:  defd to GYN RECTAL: defd to GYN Musculoskeletal: No gross deformities Neurological: She is alert and oriented to person, place, and time. No cranial nerve deficit. Coordination, balance, strength, speech and gait are normal.  Skin: Skin is warm and dry. No rash noted. No erythema.  Psychiatric: Patient has a normal mood and affect. behavior is normal. Judgment and thought content normal.   Assessment & Plan RTC in 1 year CMET, CBC w/diff, TSH, IBC panel, Ferritin, B12, amylase, lipase on 05/02/17 overall stable Upcoming GI appointment scheduled for 6/27 for further evaluation of bowel changes, she will also continue routine follow up with urogyn, gyn and orthopedics

## 2017-08-04 NOTE — Assessment & Plan Note (Signed)
Stable. Continue current meds.   

## 2017-08-04 NOTE — Assessment & Plan Note (Signed)
-  USPSTF grade A and B recommendations reviewed with patient; age-appropriate recommendations, preventive care, screening tests, etc discussed and encouraged; healthy living and sunscreen use encouraged; see AVS for patient education given to patient. Declines advanced directives packet -Discussed importance of 150 minutes of physical activity weekly, eat 6 servings of fruit/vegetables daily and drink plenty of water and avoid sweet beverages.  -Follow up and care instructions discussed and provided in AVS.  -Reviewed Health Maintenance:  Will request GYN records to update HM documentation Need for Tdap vaccination- Tdap vaccine greater than or equal to 7yo IM Encounter for hepatitis C virus screening test for high risk patient- Hepatitis C antibody; Future  Screening for cholesterol level-fasting today- Lipid panel; Future  Elevated glucose- Hemoglobin A1c; Future

## 2017-08-05 ENCOUNTER — Other Ambulatory Visit (HOSPITAL_COMMUNITY): Payer: BLUE CROSS/BLUE SHIELD

## 2017-08-05 LAB — HEPATITIS C ANTIBODY
HEP C AB: NONREACTIVE
SIGNAL TO CUT-OFF: 0.01 (ref <1.00)

## 2017-08-08 ENCOUNTER — Other Ambulatory Visit (HOSPITAL_COMMUNITY): Payer: BLUE CROSS/BLUE SHIELD

## 2017-08-09 ENCOUNTER — Ambulatory Visit (INDEPENDENT_AMBULATORY_CARE_PROVIDER_SITE_OTHER): Payer: BLUE CROSS/BLUE SHIELD | Admitting: Licensed Clinical Social Worker

## 2017-08-09 ENCOUNTER — Other Ambulatory Visit (HOSPITAL_COMMUNITY): Payer: BLUE CROSS/BLUE SHIELD

## 2017-08-09 DIAGNOSIS — F332 Major depressive disorder, recurrent severe without psychotic features: Secondary | ICD-10-CM

## 2017-08-10 ENCOUNTER — Encounter (HOSPITAL_COMMUNITY): Payer: Self-pay | Admitting: Licensed Clinical Social Worker

## 2017-08-10 ENCOUNTER — Other Ambulatory Visit (HOSPITAL_COMMUNITY): Payer: BLUE CROSS/BLUE SHIELD

## 2017-08-10 NOTE — Progress Notes (Signed)
Daily Group Progress Note Program:  OP Evening Group   Group Time: 5:30-6:30pm  Participation Level: Active  Behavioral Response: Appropriate  Type of Therapy:  Psychoeducation/Group Process  Summary of Progress:  Pt participated in a discussion on family relationships and how  families who nurture relationships tend to communicate better in times of stress. Pt was encouraged to continue using effective communication skills in times of stress.  Lisbeth [name], LCAS  

## 2017-08-11 ENCOUNTER — Other Ambulatory Visit (HOSPITAL_COMMUNITY): Payer: BLUE CROSS/BLUE SHIELD

## 2017-08-12 ENCOUNTER — Other Ambulatory Visit (HOSPITAL_COMMUNITY): Payer: BLUE CROSS/BLUE SHIELD

## 2017-08-15 ENCOUNTER — Other Ambulatory Visit (HOSPITAL_COMMUNITY): Payer: BLUE CROSS/BLUE SHIELD

## 2017-08-16 ENCOUNTER — Other Ambulatory Visit (HOSPITAL_COMMUNITY): Payer: BLUE CROSS/BLUE SHIELD

## 2017-08-16 ENCOUNTER — Ambulatory Visit (INDEPENDENT_AMBULATORY_CARE_PROVIDER_SITE_OTHER): Payer: BLUE CROSS/BLUE SHIELD | Admitting: Licensed Clinical Social Worker

## 2017-08-16 ENCOUNTER — Encounter (HOSPITAL_COMMUNITY): Payer: Self-pay | Admitting: Licensed Clinical Social Worker

## 2017-08-16 DIAGNOSIS — F332 Major depressive disorder, recurrent severe without psychotic features: Secondary | ICD-10-CM | POA: Diagnosis not present

## 2017-08-16 NOTE — Progress Notes (Signed)
Daily Group Progress Note   Program: OP Evening Group   Group Time: 5:30-6:30pm  Participation Level: Active  Behavioral Response: Appropriate  Type of Therapy:  Psychoeducation/Therapy  Summary of Progress: Pt participated a discussion on stress management, discovering not all stress is bad. Pt identified that stress response is a tool used to increase the odds of overcoming obstacles, before becoming too intense or problematic.  Pt was encouraged to continue using her stress management skills.  [name]. [name], LCAS 

## 2017-08-17 ENCOUNTER — Other Ambulatory Visit (HOSPITAL_COMMUNITY): Payer: BLUE CROSS/BLUE SHIELD

## 2017-08-18 ENCOUNTER — Ambulatory Visit: Payer: Self-pay | Admitting: Internal Medicine

## 2017-08-18 ENCOUNTER — Other Ambulatory Visit (HOSPITAL_COMMUNITY): Payer: BLUE CROSS/BLUE SHIELD

## 2017-08-19 ENCOUNTER — Other Ambulatory Visit (HOSPITAL_COMMUNITY): Payer: BLUE CROSS/BLUE SHIELD

## 2017-08-22 ENCOUNTER — Other Ambulatory Visit (HOSPITAL_COMMUNITY): Payer: BLUE CROSS/BLUE SHIELD

## 2017-08-22 ENCOUNTER — Ambulatory Visit: Payer: BLUE CROSS/BLUE SHIELD | Admitting: Nurse Practitioner

## 2017-08-22 DIAGNOSIS — Z01419 Encounter for gynecological examination (general) (routine) without abnormal findings: Secondary | ICD-10-CM | POA: Diagnosis not present

## 2017-08-22 DIAGNOSIS — Z6841 Body Mass Index (BMI) 40.0 and over, adult: Secondary | ICD-10-CM | POA: Diagnosis not present

## 2017-08-22 DIAGNOSIS — R3915 Urgency of urination: Secondary | ICD-10-CM | POA: Diagnosis not present

## 2017-08-22 DIAGNOSIS — R32 Unspecified urinary incontinence: Secondary | ICD-10-CM | POA: Diagnosis not present

## 2017-08-23 ENCOUNTER — Ambulatory Visit (INDEPENDENT_AMBULATORY_CARE_PROVIDER_SITE_OTHER)
Admission: RE | Admit: 2017-08-23 | Discharge: 2017-08-23 | Disposition: A | Discharge status: Home / Self Care | Payer: BLUE CROSS/BLUE SHIELD | Source: Ambulatory Visit | Attending: Nurse Practitioner | Admitting: Nurse Practitioner

## 2017-08-23 ENCOUNTER — Other Ambulatory Visit (HOSPITAL_COMMUNITY): Payer: BLUE CROSS/BLUE SHIELD

## 2017-08-23 ENCOUNTER — Encounter: Payer: Self-pay | Admitting: Nurse Practitioner

## 2017-08-23 ENCOUNTER — Ambulatory Visit: Payer: BLUE CROSS/BLUE SHIELD | Admitting: Nurse Practitioner

## 2017-08-23 ENCOUNTER — Ambulatory Visit (HOSPITAL_COMMUNITY): Payer: Self-pay | Admitting: Licensed Clinical Social Worker

## 2017-08-23 VITALS — BP 126/82 | HR 103 | Temp 98.8°F | Resp 16 | Ht 63.0 in | Wt 238.0 lb

## 2017-08-23 DIAGNOSIS — M25512 Pain in left shoulder: Secondary | ICD-10-CM

## 2017-08-23 DIAGNOSIS — S4992XA Unspecified injury of left shoulder and upper arm, initial encounter: Secondary | ICD-10-CM | POA: Diagnosis not present

## 2017-08-23 MED ORDER — BACLOFEN 5 MG PO TABS: 5.0000 mg | ORAL_TABLET | Freq: Three times a day (TID) | ORAL | 0 refills | Status: DC

## 2017-08-23 MED FILL — ESTRADIOL 10 MCG TABS: 10 | 28 days supply | Qty: 18 | Fill #0

## 2017-08-23 NOTE — Progress Notes (Signed)
Name: Carly Delacruz   MRN: [id number]    DOB: 16-Jul-1965   Date:08/23/2017       Progress Note  Subjective  Chief Complaint  Chief Complaint  Patient presents with  . Arm Pain    left arm pain from shoulder to elbow since friday, states she fell off the bed 2 weeks ago    HPI  Carly Delacruz is here today for evaluation of left arm pain This is an acute problem. She first noticed the arm pain last Thursday, while sitting at rest. She describes as a sore pain to her left upper arm, left shoulder and says she can feel a "tender knot" in her upper arm. The pain radiates down to her left elbow. The pain is worse with movement and when lying on the arm, and she is having trouble extending her arm. She did fall off bed about 2 weeks ago, did not notice any immediate injuries at that time and felt fine afterwards She has been trying trying tylenol, ice and voltaren gel at home with no relief. She avoids NSAIDs due to h/o gastric bypass, and is currently under pain management care for chronic pain. Denies fevers, weakness, chest pain, shortness of breath, erythema, ecchymosis.  Patient Active Problem List   Diagnosis Date Noted  . Encounter for general adult medical examination with abnormal findings 08/04/2017  . Depression 05/08/2017  . Glaucoma 05/02/2017  . Chronic back pain 08/12/2016  . Anemia 08/12/2016  . H/O bladder infections 08/12/2016  . S/P gastric bypass 08/12/2016  . Polypharmacy   . Sleep-disordered breathing 07/26/2014  . GERD (gastroesophageal reflux disease) 06/15/2014  . Neuropathy 06/15/2014  . Essential hypertension 06/15/2014  . Obesity 05/08/2013  . Morbid obesity (HCC) 03/02/2003    Social History   Tobacco Use  . Smoking status: Never Smoker  . Smokeless tobacco: Never Used  Substance Use Topics  . Alcohol use: No     Current Outpatient Medications:  .  acetaminophen (TYLENOL) 500 MG tablet, Take 500 mg by mouth every 6 (six) hours as needed.,  Disp: , Rfl:  .  amitriptyline (ELAVIL) 100 MG tablet, Take 1 tablet (100 mg total) by mouth at bedtime., Disp: 90 tablet, Rfl: 0 .  amLODipine (NORVASC) 10 MG tablet, Take 10 mg by mouth daily. , Disp: , Rfl:  .  diazepam (VALIUM) 5 MG tablet, Place 5 mg vaginally every 8 (eight) hours as needed (for pelvic floor spasms). , Disp: , Rfl:  .  famotidine (PEPCID) 20 MG tablet, Take 20 mg by mouth 2 (two) times daily., Disp: , Rfl:  .  FeFum-FePoly-FA-B Cmp-C-Biot (FOLIVANE-PLUS) CAPS, Take by mouth., Disp: , Rfl:  .  fluconazole (DIFLUCAN) 100 MG tablet, Take 100 mg by mouth daily., Disp: , Rfl:  .  FLUoxetine (PROZAC) 40 MG capsule, Take 1 capsule (40 mg total) by mouth daily., Disp: 90 capsule, Rfl: 0 .  gabapentin (NEURONTIN) 300 MG capsule, Take 1 capsule (300 mg total) by mouth 2 (two) times daily., Disp: , Rfl:  .  OVER THE COUNTER MEDICATION, Ultra joint complex vitamin, Disp: , Rfl:  .  tapentadol (NUCYNTA) 50 MG tablet, Take 1 tablet (50 mg total) by mouth every 12 (twelve) hours as needed for moderate pain. (Patient taking differently: Take 50 mg by mouth 3 (three) times daily. ), Disp: , Rfl:  .  tiZANidine (ZANAFLEX) 4 MG tablet, Take 4 mg by mouth 3 (three) times daily as needed for muscle spasms. , Disp: , Rfl:  .  Zinc 50 MG CAPS, Take 50 mg by mouth., Disp: , Rfl:   Allergies  Allergen Reactions  . Morphine And Related Shortness Of Breath and Itching  . Septra [Sulfamethoxazole-Trimethoprim] Anaphylaxis and Hives  . Sulfa Antibiotics Anaphylaxis, Hives and Swelling    Throat swelling  . Sulfamethoxazole-Trimethoprim Anaphylaxis and Hives  . Sulfasalazine Anaphylaxis, Hives, Swelling and Other (See Comments)    Throat swelling  . Ultram [Tramadol] Shortness Of Breath, Anxiety and Other (See Comments)    "panic attacks"   . Ciprofloxacin Other (See Comments)    This medication was given along with a sulfa. Reaction is unknown at this time. Must list due to possible allergy.  Leodis [place].  Darvocet [Propoxyphene N-Acetaminophen] Itching  . Adhesive [Tape] Rash  . Codeine Itching and Other (See Comments)    No change when benadryl is given  . Demerol [Meperidine] Nausea Only  . Hydromorphone Itching and Other (See Comments)    No change when benadryl is given  . Losartan Palpitations    ROS  No other specific complaints in a complete review of systems (except as listed in HPI above).  Objective  Vitals:   08/23/17 1056  BP: 126/82  Pulse: (!) 103  Resp: 16  Temp: 98.8 F (37.1 C)  TempSrc: Oral  SpO2: 98%  Weight: 238 lb (108 kg)  Height: 5\' 3"  (1.6 m)   Body mass index is 42.16 kg/m.  Nursing Note and Vital Signs reviewed.  Physical Exam  Constitutional: Patient appears well-developed and well-nourished.  No distress. Ambulatory with cane. HEENT: head atraumatic, normocephalic, neck supple, oropharynx pink and moist without exudate Cardiovascular: Normal rate, regular rhythm, S1/S2 present.  Distal pulses intact. Pulmonary/Chest: Effort normal and breath sounds clear. No respiratory distress or retractions. Musculoskeletal: No gross deformities. Left upper arm appears mildly swollen, tenderness to palpation is noted distal to [facility]. Neurological: She is alert and oriented to person, place, and time. No cranial nerve deficit. Coordination, balance, strength, speech and gait are normal.  Skin: Skin is warm and dry. No rash noted. No erythema or bruising Psychiatric: Patient has a normal mood and affect. Judgment and thought content normal.   Assessment & Plan  Acute pain of left shoulder She would like something additional for pain, but does not want any controlled substances due to pain management, will Rx short course of baclofen, she is currently out of her muscle relaxer from pain management provider-dosing and side effects discussed Will obtain shoulder x-ray and F/U with further plan of care pending x-ray results  Follow up and care instructions  discussed during visit - Baclofen 5 MG TABS; Take 5 mg by mouth 3 (three) times daily.  Dispense: 21 tablet; Refill: 0 - DG Shoulder Left; Future

## 2017-08-23 NOTE — Patient Instructions (Signed)
Please head downstairs for lab work/x-rays. If any of your test results are critically abnormal, you will be contacted right away. Otherwise, I will contact you within a week about your test results and any recommendations for abnormalities.  I have sent a prescription for a muscle relaxer baclofen 5mg  up to three times daily as needed for arm pain. This medication can cause drowsiness. Please do not drink alcohol or operate machinery when you take this medication. Please do not combine this medication with other medications that can cause drowsiness.

## 2017-08-24 ENCOUNTER — Other Ambulatory Visit (HOSPITAL_COMMUNITY): Payer: BLUE CROSS/BLUE SHIELD

## 2017-08-25 ENCOUNTER — Ambulatory Visit: Payer: Self-pay | Admitting: Internal Medicine

## 2017-08-25 ENCOUNTER — Other Ambulatory Visit (HOSPITAL_COMMUNITY): Payer: BLUE CROSS/BLUE SHIELD

## 2017-08-26 ENCOUNTER — Other Ambulatory Visit (HOSPITAL_COMMUNITY): Payer: BLUE CROSS/BLUE SHIELD

## 2017-08-26 ENCOUNTER — Other Ambulatory Visit: Payer: Self-pay | Admitting: *Deleted

## 2017-08-26 DIAGNOSIS — M25512 Pain in left shoulder: Secondary | ICD-10-CM

## 2017-08-26 MED ORDER — BACLOFEN 5 MG PO TABS: 5.0000 mg | ORAL_TABLET | Freq: Three times a day (TID) | ORAL | 0 refills | Status: DC

## 2017-08-29 MED FILL — FAMOTIDINE 20 MG TABLET: 20 | 30 days supply | Qty: 30 | Fill #1

## 2017-08-29 MED FILL — GABAPENTIN 300 MG CAPSULE: 300 | 30 days supply | Qty: 60 | Fill #1

## 2017-08-29 MED FILL — AMLODIPINE BESYLATE 10 MG T: 10 | 30 days supply | Qty: 30 | Fill #1

## 2017-08-29 MED FILL — tiZANidine HCL 4 MG TABS: 4 | 30 days supply | Qty: 90 | Fill #3

## 2017-08-30 ENCOUNTER — Ambulatory Visit (INDEPENDENT_AMBULATORY_CARE_PROVIDER_SITE_OTHER): Payer: BLUE CROSS/BLUE SHIELD | Admitting: Licensed Clinical Social Worker

## 2017-08-30 DIAGNOSIS — F332 Major depressive disorder, recurrent severe without psychotic features: Secondary | ICD-10-CM

## 2017-08-30 DIAGNOSIS — M545 Low back pain: Secondary | ICD-10-CM | POA: Diagnosis not present

## 2017-08-30 DIAGNOSIS — M7062 Trochanteric bursitis, left hip: Secondary | ICD-10-CM | POA: Diagnosis not present

## 2017-08-30 DIAGNOSIS — M7061 Trochanteric bursitis, right hip: Secondary | ICD-10-CM | POA: Diagnosis not present

## 2017-08-30 MED FILL — NUCYNTA 50 MG TABLET: 50 | 30 days supply | Qty: 90 | Fill #0

## 2017-08-30 MED FILL — diazePAM 5 MG TABS: 5 | 17 days supply | Qty: 50 | Fill #0

## 2017-08-31 ENCOUNTER — Other Ambulatory Visit: Payer: Self-pay

## 2017-08-31 ENCOUNTER — Encounter (HOSPITAL_COMMUNITY): Payer: Self-pay | Admitting: Licensed Clinical Social Worker

## 2017-08-31 ENCOUNTER — Telehealth: Payer: Self-pay

## 2017-08-31 DIAGNOSIS — R300 Dysuria: Secondary | ICD-10-CM

## 2017-08-31 NOTE — Telephone Encounter (Signed)
Copied from CRM [id number]. Topic: Inquiry >> Aug 31, 2017  9:45 AM Burchel, Abbi R wrote: Marylene Landngela [facility]([employer] called to ask if it would be possible for pt to have standing orders at the lab for U/A.  Pt has hx of sepsis from an untreated UTI and her GYN would ike for her to be able to come in and provide a sample w/o an appt.  Please ask Catarina Hartshornshley Shambley to ok these orders.  Please call Marylene Landngela to notify of response.    [phone] [facility]

## 2017-08-31 NOTE — Telephone Encounter (Signed)
LVM letting Marylene Landngela know that orders have been placed for pt to get done at her convenience.

## 2017-08-31 NOTE — Telephone Encounter (Signed)
Is this ok with you? If so I will place the orders and call pt and let her know. Thanks

## 2017-08-31 NOTE — Telephone Encounter (Signed)
Okay with me 

## 2017-08-31 NOTE — Progress Notes (Signed)
Daily Group Progress Note Program:  OP Evening Group   Group Time: 5:30-6:30pm  Participation Level: Active  Behavioral Response: Appropriate  Type of Therapy:  Psychoeducation/Group Process  Summary of Progress: Pt participated in a discussion on identifying and coping with feelings and emotions in mental health recovery. Pt was encouraged to continue to share her emotions in a healthy way.  Shaquasha Gerstel, LCAS 

## 2017-09-02 ENCOUNTER — Telehealth: Payer: Self-pay

## 2017-09-02 NOTE — Telephone Encounter (Signed)
[facility] stating that the baclofen is not covered by insurance and it needs to be changed to an alternative. Please advise.

## 2017-09-05 NOTE — Telephone Encounter (Signed)
Please have her call pain management to see if it is time to have her zanaflex refilled, I sent the baclofen for her on 6/25 since she was out of her zanaflex, but it has been some time since her visit and pain management may have refilled the zanaflex by now

## 2017-09-06 ENCOUNTER — Ambulatory Visit (HOSPITAL_COMMUNITY): Payer: Self-pay | Admitting: Licensed Clinical Social Worker

## 2017-09-07 ENCOUNTER — Ambulatory Visit: Payer: BLUE CROSS/BLUE SHIELD | Admitting: Nurse Practitioner

## 2017-09-07 NOTE — Telephone Encounter (Signed)
LVM for pt to call back in regards.  

## 2017-09-08 NOTE — Telephone Encounter (Signed)
Pt returned call, she did receive medication from pain management.

## 2017-09-09 DIAGNOSIS — M79672 Pain in left foot: Secondary | ICD-10-CM | POA: Diagnosis not present

## 2017-09-09 DIAGNOSIS — M25572 Pain in left ankle and joints of left foot: Secondary | ICD-10-CM | POA: Diagnosis not present

## 2017-09-13 ENCOUNTER — Ambulatory Visit (HOSPITAL_COMMUNITY): Payer: Self-pay | Admitting: Licensed Clinical Social Worker

## 2017-09-14 ENCOUNTER — Ambulatory Visit (INDEPENDENT_AMBULATORY_CARE_PROVIDER_SITE_OTHER): Payer: BLUE CROSS/BLUE SHIELD | Admitting: Licensed Clinical Social Worker

## 2017-09-14 ENCOUNTER — Telehealth (HOSPITAL_COMMUNITY): Payer: Self-pay

## 2017-09-14 DIAGNOSIS — M7062 Trochanteric bursitis, left hip: Secondary | ICD-10-CM | POA: Diagnosis not present

## 2017-09-14 DIAGNOSIS — F332 Major depressive disorder, recurrent severe without psychotic features: Secondary | ICD-10-CM

## 2017-09-14 DIAGNOSIS — M7061 Trochanteric bursitis, right hip: Secondary | ICD-10-CM | POA: Diagnosis not present

## 2017-09-14 DIAGNOSIS — M545 Low back pain: Secondary | ICD-10-CM | POA: Diagnosis not present

## 2017-09-14 NOTE — Telephone Encounter (Signed)
Patient was here today to see Beth. She said she needs a refill on her Amitriptyline, I spoke with patient at length about why she is out so soon. Patient states that she will wake up early in the morning and run errands, when she gets home she wants to rest, but her body will not relax (patients legs were moving the whole time I was speaking with her) so she takes 1/2 to 1 amitriptyline to help her go back to sleep. Patient understands that this is not how the medication was ordered, but really wants to continue it this way. Another concern is that patient is having black outs and falls. [facility] but was [facility] for missing 2 appointments, can we refer her to another neurologist? Please review and advise, thank you

## 2017-09-15 ENCOUNTER — Encounter (HOSPITAL_COMMUNITY): Payer: Self-pay | Admitting: Licensed Clinical Social Worker

## 2017-09-15 NOTE — Telephone Encounter (Signed)
She has continued to increased outside of medical direction, and she did the same thing the last visit.  Will not continue refills on the medication.  And I suspect her overuse of this is contributing to falls and blackouts

## 2017-09-15 NOTE — Progress Notes (Signed)
   THERAPIST PROGRESS NOTE  Session Time: 3:10-4pm  Participation Level: Active  Behavioral Response: Casual/Alert/anxious  Type of Therapy: Individual Therapy  Treatment Goals addressed: Depression  Interventions: CBT/Motivational Interviewing  Summary: Carly Delacruz is a 52 y.o. female   Suicidal/Homicidal: Nowithout intent/plan  Therapist Response: Today was pt's first day of individual counseling since January. She has been coming sporadically to the OP group.Marland Kitchen. Pt discussed her psychiatric symptoms and current life events.Discussed with pt her diagnosis of MDD and her symptoms. Pt reports she has been taking her amitriptyline not as prescribed. Asked CMA Regan to join us in session to discuss pt's symptoms and medications. She will speak to Dr. Rene KocherEksir, pt's psychiatrist and will call pt and let her know the outcome. Pt reports she has been falling a lot and have blackouts and hallucinations. Asked CMA Regan to also let Dr. Rene KocherEksir know this information as well. Pt has recently fractured her foot so she is in a brace. Asked questions about the accident. She and her mother, who  Lives with pt, have a tenuous relationship. Discussed this with pt and alternatives to coping. Pt still engages in church activities, where she has her support system. Discussed with pt the importance of making individual appts along with group sessions.     Plan: Return again in 2 weeks.  Diagnosis: Axis 1: Severe recurrent major depressive disorder     [name],[name], LCAS 09/14/17

## 2017-09-20 ENCOUNTER — Ambulatory Visit (HOSPITAL_COMMUNITY): Payer: Self-pay | Admitting: Licensed Clinical Social Worker

## 2017-09-21 ENCOUNTER — Ambulatory Visit (HOSPITAL_COMMUNITY): Payer: Self-pay | Admitting: Licensed Clinical Social Worker

## 2017-09-22 ENCOUNTER — Ambulatory Visit (HOSPITAL_COMMUNITY): Payer: Self-pay | Admitting: Psychiatry

## 2017-09-22 DIAGNOSIS — M461 Sacroiliitis, not elsewhere classified: Secondary | ICD-10-CM | POA: Diagnosis not present

## 2017-09-22 DIAGNOSIS — G4700 Insomnia, unspecified: Secondary | ICD-10-CM | POA: Diagnosis not present

## 2017-09-22 DIAGNOSIS — M47816 Spondylosis without myelopathy or radiculopathy, lumbar region: Secondary | ICD-10-CM | POA: Diagnosis not present

## 2017-09-22 DIAGNOSIS — G894 Chronic pain syndrome: Secondary | ICD-10-CM | POA: Diagnosis not present

## 2017-09-22 MED FILL — tiZANidine HCL 4 MG TABS: 4 | 30 days supply | Qty: 90 | Fill #0

## 2017-09-22 MED FILL — GABAPENTIN 300 MG CAPSULE: 300 | 30 days supply | Qty: 60 | Fill #0

## 2017-09-22 MED FILL — NUCYNTA 50 MG TABLET: 50 | 30 days supply | Qty: 90 | Fill #0

## 2017-09-23 DIAGNOSIS — M79672 Pain in left foot: Secondary | ICD-10-CM | POA: Diagnosis not present

## 2017-09-27 ENCOUNTER — Ambulatory Visit (INDEPENDENT_AMBULATORY_CARE_PROVIDER_SITE_OTHER): Payer: BLUE CROSS/BLUE SHIELD | Admitting: Licensed Clinical Social Worker

## 2017-09-27 DIAGNOSIS — F332 Major depressive disorder, recurrent severe without psychotic features: Secondary | ICD-10-CM

## 2017-09-28 ENCOUNTER — Encounter (HOSPITAL_COMMUNITY): Payer: Self-pay | Admitting: Licensed Clinical Social Worker

## 2017-09-28 ENCOUNTER — Ambulatory Visit (INDEPENDENT_AMBULATORY_CARE_PROVIDER_SITE_OTHER): Payer: BLUE CROSS/BLUE SHIELD | Admitting: Licensed Clinical Social Worker

## 2017-09-28 DIAGNOSIS — F332 Major depressive disorder, recurrent severe without psychotic features: Secondary | ICD-10-CM | POA: Diagnosis not present

## 2017-09-28 NOTE — Progress Notes (Signed)
  Daily Group Progress Note    Program: OP Evening Group    Group Time: 5:30-6:30pm  Participation Level: Active  Behavioral Response: Appropriate  Type of Therapy:  Psychoeducation/Therapy  Summary of Progress: Pt participated in a discussion on anger, an umbrella emotion. When someone is hurt by the actions of others, they may lash out and express the"umbrella" emotion, anger, instead of an underlying emotion (i.e. hurt, betrayal, etc.). We do this because we feel too vulnerable or insecure in the moment, and it is therefore easier to express anger. Pt identified her feelings when she expresses anger instead of the identified emotion. Pt was encouraged to become more vulnerable in expressing her emotions.  Olin Gurski LCAS 

## 2017-09-28 NOTE — Progress Notes (Signed)
   THERAPIST PROGRESS NOTE  Session Time: 3:10-4pm  Participation Level: Active  Behavioral Response: Casual/Alert/anxious  Type of Therapy: Individual Therapy  Treatment Goals addressed: Depression  Interventions: CBT/Motivational Interviewing  Summary: Gladstone Pihamela J Ferris is a 52 y.o. female   Suicidal/Homicidal: Nowithout intent/plan  Therapist Response: Pt presents for her individual counseling session. Pt discussed her psychiatric symptoms and current life events. Pt continues to take her medications. She has an upcoming appointment with Dr. Rene KocherEksir, psychiatrist, in August. Pt continues to live in her home with her mother. She and her mother have a contentious relationship. Pt shared some of the discussions between she and her mother. Role played with pt how to talk to her mother about how she makes her feel when she is critical. Discussed alternative living arrangements for her mother. One year ago pt's life changed drastically. She left her life long job at [employer] and went on disability. She has many health issues, lives on [place] and [place]. She has chronic pain which is managed by a pain management clinic. Pt is very focused on all of her losses. Redirected pt in her thoughts. Discussed gratitudes and self pity with pt.    Plan: Return again in 2 weeks for individual therapy and weekly group therapy  Diagnosis: Axis 1: Severe recurrent major depressive disorder     [name],[name], LCAS 09/28/17

## 2017-09-30 ENCOUNTER — Telehealth: Payer: Self-pay

## 2017-09-30 NOTE — Telephone Encounter (Signed)
CRM was routed to HIM Department

## 2017-09-30 NOTE — Telephone Encounter (Signed)
Copied from CRM [id number]. Topic: Quick Communication - See Telephone Encounter >> Sep 23, 2017  3:43 PM Carly Delacruz, Carly Delacruz wrote: Carly Delacruz w/ [employer] [phone]- [phone]  Claim Number [id number]  Checking to see if office received the fax request for all office notes from Mar 01, 2017 to current date.

## 2017-10-03 ENCOUNTER — Encounter (HOSPITAL_COMMUNITY): Payer: Self-pay | Admitting: Psychiatry

## 2017-10-03 ENCOUNTER — Ambulatory Visit (INDEPENDENT_AMBULATORY_CARE_PROVIDER_SITE_OTHER)
Admission: RE | Admit: 2017-10-03 | Discharge: 2017-10-03 | Disposition: A | Discharge status: Home / Self Care | Payer: BLUE CROSS/BLUE SHIELD | Source: Ambulatory Visit | Attending: Family | Admitting: Family

## 2017-10-03 ENCOUNTER — Ambulatory Visit (INDEPENDENT_AMBULATORY_CARE_PROVIDER_SITE_OTHER): Payer: BLUE CROSS/BLUE SHIELD | Admitting: Psychiatry

## 2017-10-03 ENCOUNTER — Ambulatory Visit: Payer: BLUE CROSS/BLUE SHIELD | Admitting: Family

## 2017-10-03 ENCOUNTER — Other Ambulatory Visit (INDEPENDENT_AMBULATORY_CARE_PROVIDER_SITE_OTHER): Payer: BLUE CROSS/BLUE SHIELD

## 2017-10-03 ENCOUNTER — Encounter: Payer: Self-pay | Admitting: Family

## 2017-10-03 VITALS — BP 142/90 | HR 104 | Temp 98.8°F | Ht 63.0 in | Wt 237.0 lb

## 2017-10-03 DIAGNOSIS — F4312 Post-traumatic stress disorder, chronic: Secondary | ICD-10-CM

## 2017-10-03 DIAGNOSIS — R600 Localized edema: Secondary | ICD-10-CM

## 2017-10-03 DIAGNOSIS — F411 Generalized anxiety disorder: Secondary | ICD-10-CM

## 2017-10-03 DIAGNOSIS — F332 Major depressive disorder, recurrent severe without psychotic features: Secondary | ICD-10-CM

## 2017-10-03 DIAGNOSIS — J811 Chronic pulmonary edema: Secondary | ICD-10-CM | POA: Diagnosis not present

## 2017-10-03 LAB — COMPREHENSIVE METABOLIC PANEL
ALT: 18 U/L (ref 0–35)
AST: 16 U/L (ref 0–37)
Albumin: 4.2 g/dL (ref 3.5–5.2)
Alkaline Phosphatase: 100 U/L (ref 39–117)
BILIRUBIN TOTAL: 0.2 mg/dL (ref 0.2–1.2)
BUN: 15 mg/dL (ref 6–23)
CALCIUM: 9.4 mg/dL (ref 8.4–10.5)
CO2: 31 meq/L (ref 19–32)
Chloride: 103 mEq/L (ref 96–112)
Creatinine, Ser: 0.84 mg/dL (ref 0.40–1.20)
GFR: 90.81 mL/min (ref >60.00)
Glucose, Bld: 106 mg/dL — ABNORMAL HIGH (ref 70–99)
Potassium: 4.1 mEq/L (ref 3.5–5.1)
Sodium: 141 mEq/L (ref 135–145)
Total Protein: 7.5 g/dL (ref 6.0–8.3)

## 2017-10-03 LAB — CBC WITH DIFFERENTIAL/PLATELET
BASOS ABS: 0.1 10*3/uL (ref 0.0–0.1)
Basophils Relative: 0.9 % (ref 0.0–3.0)
Eosinophils Absolute: 0 10*3/uL (ref 0.0–0.7)
Eosinophils Relative: 0.5 % (ref 0.0–5.0)
HEMATOCRIT: 36.4 % (ref 36.0–46.0)
HEMOGLOBIN: 11.8 g/dL — AB (ref 12.0–15.0)
LYMPHS PCT: 17.9 % (ref 12.0–46.0)
Lymphs Abs: 1.4 10*3/uL (ref 0.7–4.0)
MCHC: 32.5 g/dL (ref 30.0–36.0)
MCV: 88.5 fl (ref 78.0–100.0)
MONOS PCT: 7.4 % (ref 3.0–12.0)
Monocytes Absolute: 0.6 10*3/uL (ref 0.1–1.0)
NEUTROS ABS: 5.7 10*3/uL (ref 1.4–7.7)
Neutrophils Relative %: 73.3 % (ref 43.0–77.0)
Platelets: 317 10*3/uL (ref 150.0–400.0)
RBC: 4.11 Mil/uL (ref 3.87–5.11)
RDW: 14.7 % (ref 11.5–15.5)
WBC: 7.7 10*3/uL (ref 4.0–10.5)

## 2017-10-03 LAB — BRAIN NATRIURETIC PEPTIDE: Pro B Natriuretic peptide (BNP): 109 pg/mL — ABNORMAL HIGH (ref 0.0–100.0)

## 2017-10-03 MED ORDER — POTASSIUM CHLORIDE ER 10 MEQ PO TBCR: 10.0000 meq | EXTENDED_RELEASE_TABLET | Freq: Two times a day (BID) | ORAL | 0 refills | Status: DC

## 2017-10-03 MED ORDER — AMITRIPTYLINE HCL 150 MG PO TABS: 150.0000 mg | ORAL_TABLET | Freq: Every day | ORAL | 0 refills | Status: DC

## 2017-10-03 MED ORDER — FUROSEMIDE 20 MG PO TABS
20.0000 mg | ORAL_TABLET | Freq: Two times a day (BID) | ORAL | 0 refills | Status: DC
Start: 2017-10-03 — End: 2017-11-23

## 2017-10-03 MED ORDER — FLUOXETINE HCL 40 MG PO CAPS: 40.0000 mg | ORAL_CAPSULE | Freq: Every day | ORAL | 0 refills | Status: AC

## 2017-10-03 MED FILL — AMITRIPTYLINE HCL 150 MG TA: 150 | 90 days supply | Qty: 90 | Fill #0

## 2017-10-03 MED FILL — POTASSIUM CL ER 10 MEQ TABL: 10 | 30 days supply | Qty: 60 | Fill #0

## 2017-10-03 MED FILL — FLUoxetine HCL 40 MG CAPS: 40 | 90 days supply | Qty: 90 | Fill #0

## 2017-10-03 MED FILL — FUROSEMIDE 20 MG TABS: 20 | 30 days supply | Qty: 60 | Fill #0

## 2017-10-03 NOTE — Progress Notes (Signed)
Carly Delacruz is a 52 y.o. female with the following history as recorded in EpicCare:  Patient Active Problem List   Diagnosis Date Noted  . Encounter for general adult medical examination with abnormal findings 08/04/2017  . Depression 05/08/2017  . Glaucoma 05/02/2017  . Chronic back pain 08/12/2016  . Anemia 08/12/2016  . H/O bladder infections 08/12/2016  . S/P gastric bypass 08/12/2016  . Polypharmacy   . Sleep-disordered breathing 07/26/2014  . GERD (gastroesophageal reflux disease) 06/15/2014  . Neuropathy 06/15/2014  . Essential hypertension 06/15/2014  . Obesity 05/08/2013  . Morbid obesity ([place]) 03/02/2003    Current Outpatient Medications  Medication Sig Dispense Refill  . acetaminophen (TYLENOL) 500 MG tablet Take 500 mg by mouth every 6 (six) hours as needed.    Marland Kitchen amitriptyline (ELAVIL) 100 MG tablet Take 1 tablet (100 mg total) by mouth at bedtime. 90 tablet 0  . amLODipine (NORVASC) 10 MG tablet Take 10 mg by mouth daily.     . diazepam (VALIUM) 5 MG tablet Place 5 mg vaginally every 8 (eight) hours as needed (for pelvic floor spasms).     . Estradiol 10 MCG TABS vaginal tablet   12  . famotidine (PEPCID) 20 MG tablet Take 20 mg by mouth 2 (two) times daily.    Marland Kitchen FeFum-FePoly-FA-B Cmp-C-Biot (FOLIVANE-PLUS) CAPS Take by mouth.    Marland Kitchen FLUoxetine (PROZAC) 40 MG capsule Take 1 capsule (40 mg total) by mouth daily. 90 capsule 0  . gabapentin (NEURONTIN) 300 MG capsule Take 1 capsule (300 mg total) by mouth 2 (two) times daily.    Marland Kitchen OVER THE COUNTER MEDICATION Ultra joint complex vitamin    . tapentadol (NUCYNTA) 50 MG tablet Take 1 tablet (50 mg total) by mouth every 12 (twelve) hours as needed for moderate pain. (Patient taking differently: Take 50 mg by mouth 3 (three) times daily. )    . tiZANidine (ZANAFLEX) 4 MG tablet Take 4 mg by mouth 3 (three) times daily as needed for muscle spasms.     . Zinc 50 MG CAPS Take 50 mg by mouth.    . furosemide (LASIX) 20 MG  tablet Take 1 tablet (20 mg total) by mouth 2 (two) times daily. Prn for swelling 60 tablet 0  . potassium chloride (K-DUR) 10 MEQ tablet Take 1 tablet (10 mEq total) by mouth 2 (two) times daily. Use as directed with Lasix 60 tablet 0   No current facility-administered medications for this visit.     Allergies: Morphine and related; Septra [sulfamethoxazole-trimethoprim]; Sulfa antibiotics; Sulfamethoxazole-trimethoprim; Sulfasalazine; Ultram [tramadol]; Ciprofloxacin; Darvocet [propoxyphene n-acetaminophen]; Adhesive [tape]; Codeine; Demerol [meperidine]; Hydromorphone; and Losartan  Past Medical History:  Diagnosis Date  . Allergy   . Anemia   . Anxiety   . Arthritis 12/10/03  . Atrophic vaginitis 2009  . BV (bacterial vaginosis) 2006  . Chronic back pain   . Chronic LLQ pain   . Complication of anesthesia   . Elevated LFTs   . Endometriosis   . Fatigue 2012  . Gallstones   . GERD (gastroesophageal reflux disease)   . H/O bladder infections   . H/O mumps   . H/O varicella   . History of candidal vulvovaginitis 03/21/03  . HTN (hypertension)   . Insomnia   . Insomnia secondary to situational depression 06/06/07  . Liver mass, right lobe 08/13/04   Nodule  . Malaise 2012  . Morbid obesity ([place]) 2005  . Osteoporosis   . S/P gastric bypass   .  Sleep apnea 2005  . SUI (stress urinary incontinence, female) 08/12/03  . Trichomonas   . Vulvar lesion 03/21/03  . Yeast infection     Past Surgical History:  Procedure Laterality Date  . ABDOMINAL HYSTERECTOMY  2001  . APPENDECTOMY  2001  . CHOLECYSTECTOMY  2011  . DILATION AND CURETTAGE OF UTERUS    . GASTRIC BYPASS  2007   [facility], Dr. Hassell Done  . KNEE ARTHROSCOPY Left   . LAPAROTOMY    . TONSILLECTOMY    . WISDOM TOOTH EXTRACTION      Family History  Problem Relation Age of Onset  . Diabetes Paternal Grandmother   . Leukemia Maternal Grandmother   . Arthritis Mother   . Prostate cancer Father   . Colitis  Sister     Social History   Tobacco Use  . Smoking status: Never Smoker  . Smokeless tobacco: Never Used  Substance Use Topics  . Alcohol use: No    Subjective:  2 week history of swelling/ pain in lower extremities; started in left foot- saw her foot doctor who placed her in brace originally and then a week later gave a cortisone shot in case there was a missed stress fracture; notes that right foot started swelling this past Friday; has tried to elevate/ ice her legs; no chest pain, no shortness of breath, no wheezing; no prior history of CHF; no recent travel; has been on Amlodipine x 4+ years with no complications/ no previous history of swelling;    Objective:  Vitals:   10/03/17 1306  BP: (!) 142/90  Pulse: (!) 104  Temp: 98.8 F (37.1 C)  TempSrc: Oral  SpO2: 96%  Weight: 237 lb (107.5 kg)  Height: '5\' 3"'$  (1.6 m)    General: Well developed, well nourished, in no acute distress  Skin : Warm and dry.  Head: Normocephalic and atraumatic  Lungs: Respirations unlabored; clear to auscultation bilaterally without wheeze, rales, rhonchi  CVS exam: normal rate and regular rhythm.  Extremities: bilateral pedal edema 1+ pitting; no cyanosis, no clubbing; bilateral erythema noted on feet; Vessels: Symmetric bilaterally  Neurologic: Alert and oriented; speech intact; face symmetrical; moves all extremities well; CNII-XII intact without focal deficit  Assessment:  1. Pedal edema     Plan:  Will update CXR and labs; will start Lasix and potassium to help with swelling- use bid prn until swelling resolves; will need to consider stopping Amlodipine but patient is adamant she has been on this for years with no side effects.    No follow-ups on file.  Orders Placed This Encounter  Procedures  . DG Chest 2 View    Standing Status:   Future    Standing Expiration Date:   12/04/2018    Order Specific Question:   Reason for Exam (SYMPTOM  OR DIAGNOSIS REQUIRED)    Answer:   pedal edema     Order Specific Question:   Is patient pregnant?    Answer:   No    Order Specific Question:   Preferred imaging location?    Answer:   [facility]    Order Specific Question:   Radiology Contrast Protocol - do NOT remove file path    Answer:   [email or web address]  . CBC w/Diff    Standing Status:   Future    Standing Expiration Date:   10/03/2018  . Comp Met (CMET)    Standing Status:   Future    Standing Expiration Date:   10/03/2018  .  B Nat Peptide    Standing Status:   Future    Standing Expiration Date:   10/03/2018    Requested Prescriptions   Signed Prescriptions Disp Refills  . furosemide (LASIX) 20 MG tablet 60 tablet 0    Sig: Take 1 tablet (20 mg total) by mouth 2 (two) times daily. Prn for swelling  . potassium chloride (K-DUR) 10 MEQ tablet 60 tablet 0    Sig: Take 1 tablet (10 mEq total) by mouth 2 (two) times daily. Use as directed with Lasix

## 2017-10-03 NOTE — Progress Notes (Signed)
BH MD/PA/NP OP Progress Note  10/03/2017 3:06 PM SAUMYA HUKILL  MRN:  [id number]  Chief Complaint: med management  HPI: AIRELLE EVERDING reports that she seemed to do much better with the amitriptyline at 150 mg nightly and wonders about being able to increase the dose.  I spent time with her reviewing the risk of falls, sedation, confusion.  She reports that her mood was so much better when she was taking this, and she would like to try this for a more consistent time..  We agreed to restart Elavil, and she can take half a tablet for a few days then increase to the 150 mg.  We discussed some alternative treatment options including Seroquel, and TMS.  She reports that she would like to think about these options as well and we will follow-up in 6-8 weeks for medication management otherwise.  She reports things are going well in therapy and she feels like she is overall starting to do better.  Visit Diagnosis:    ICD-10-CM   1. Severe episode of recurrent major depressive disorder, without psychotic features (HCC) F33.2 amitriptyline (ELAVIL) 150 MG tablet    FLUoxetine (PROZAC) 40 MG capsule  2. Chronic post-traumatic stress disorder (PTSD) F43.12 amitriptyline (ELAVIL) 150 MG tablet    FLUoxetine (PROZAC) 40 MG capsule  3. GAD (generalized anxiety disorder) F41.1 amitriptyline (ELAVIL) 150 MG tablet    FLUoxetine (PROZAC) 40 MG capsule   Past Psychiatric History: See intake H&P for full details. Reviewed, with no updates at this time.  Past Medical History:  Past Medical History:  Diagnosis Date  . Allergy   . Anemia   . Anxiety   . Arthritis 12/10/03  . Atrophic vaginitis 2009  . BV (bacterial vaginosis) 2006  . Chronic back pain   . Chronic LLQ pain   . Complication of anesthesia   . Elevated LFTs   . Endometriosis   . Fatigue 2012  . Gallstones   . GERD (gastroesophageal reflux disease)   . H/O bladder infections   . H/O mumps   . H/O varicella   . History of candidal  vulvovaginitis 03/21/03  . HTN (hypertension)   . Insomnia   . Insomnia secondary to situational depression 06/06/07  . Liver mass, right lobe 08/13/04   Nodule  . Malaise 2012  . Morbid obesity ([facility]) 2005  . Osteoporosis   . S/P gastric bypass   . Sleep apnea 2005  . SUI (stress urinary incontinence, female) 08/12/03  . Trichomonas   . Vulvar lesion 03/21/03  . Yeast infection     Past Surgical History:  Procedure Laterality Date  . ABDOMINAL HYSTERECTOMY  2001  . APPENDECTOMY  2001  . CHOLECYSTECTOMY  2011  . DILATION AND CURETTAGE OF UTERUS    . GASTRIC BYPASS  2007   [facility], Dr. Daphine Deutscher  . KNEE ARTHROSCOPY Left   . LAPAROTOMY    . TONSILLECTOMY    . WISDOM TOOTH EXTRACTION      Family Psychiatric History: See intake H&P for full details. Reviewed, with no updates at this time.   Family History:  Family History  Problem Relation Age of Onset  . Diabetes Paternal Grandmother   . Leukemia Maternal Grandmother   . Arthritis Mother   . Prostate cancer Father   . Colitis Sister     Social History:  Social History   Socioeconomic History  . Marital status: Single    Spouse name: Not on file  . Number  of children: 0  . Years of education: Not on file  . Highest education level: Not on file  Occupational History  . Occupation: Therapist, nutritionaladnmission associate    Employer: [employer]  Social Needs  . Financial resource strain: Not on file  . Food insecurity:    Worry: Not on file    Inability: Not on file  . Transportation needs:    Medical: Not on file    Non-medical: Not on file  Tobacco Use  . Smoking status: Never Smoker  . Smokeless tobacco: Never Used  Substance and Sexual Activity  . Alcohol use: No  . Drug use: No  . Sexual activity: Never    Birth control/protection: Surgical    Comment: hysterectomy   Lifestyle  . Physical activity:    Days per week: Not on file    Minutes per session: Not on file  . Stress: Not on file   Relationships  . Social connections:    Talks on phone: Not on file    Gets together: Not on file    Attends religious service: Not on file    Active member of club or organization: Not on file    Attends meetings of clubs or organizations: Not on file    Relationship status: Not on file  Other Topics Concern  . Not on file  Social History Narrative  . Not on file    Allergies:  Allergies  Allergen Reactions  . Morphine And Related Shortness Of Breath and Itching  . Septra [Sulfamethoxazole-Trimethoprim] Anaphylaxis and Hives  . Sulfa Antibiotics Anaphylaxis, Hives and Swelling    Throat swelling  . Sulfamethoxazole-Trimethoprim Anaphylaxis and Hives  . Sulfasalazine Anaphylaxis, Hives, Swelling and Other (See Comments)    Throat swelling  . Ultram [Tramadol] Shortness Of Breath, Anxiety and Other (See Comments)    "panic attacks"   . Ciprofloxacin Other (See Comments)    This medication was given along with a sulfa. Reaction is unknown at this time. Must list due to possible allergy.  Leodis [place]. Darvocet [Propoxyphene N-Acetaminophen] Itching  . Adhesive [Tape] Rash  . Codeine Itching and Other (See Comments)    No change when benadryl is given  . Demerol [Meperidine] Nausea Only  . Hydromorphone Itching and Other (See Comments)    No change when benadryl is given  . Losartan Palpitations    Metabolic Disorder Labs: Lab Results  Component Value Date   HGBA1C 5.8 08/04/2017   No results found for: PROLACTIN Lab Results  Component Value Date   CHOL 208 (H) 08/04/2017   TRIG 83.0 08/04/2017   HDL 69.70 08/04/2017   CHOLHDL 3 08/04/2017   VLDL 16.6 08/04/2017   LDLCALC 122 (H) 08/04/2017   Lab Results  Component Value Date   TSH 0.71 05/02/2017   TSH 1.310 04/29/2016    Therapeutic Level Labs: No results found for: LITHIUM No results found for: VALPROATE No components found for:  CBMZ  Current Medications: Current Outpatient Medications  Medication Sig  Dispense Refill  . acetaminophen (TYLENOL) 500 MG tablet Take 500 mg by mouth every 6 (six) hours as needed.    Marland Kitchen. amitriptyline (ELAVIL) 150 MG tablet Take 1 tablet (150 mg total) by mouth at bedtime. 90 tablet 0  . amLODipine (NORVASC) 10 MG tablet Take 10 mg by mouth daily.     . diazepam (VALIUM) 5 MG tablet Place 5 mg vaginally every 8 (eight) hours as needed (for pelvic floor spasms).     . Estradiol 10  MCG TABS vaginal tablet   12  . famotidine (PEPCID) 20 MG tablet Take 20 mg by mouth 2 (two) times daily.    Marland Kitchen FeFum-FePoly-FA-B Cmp-C-Biot (FOLIVANE-PLUS) CAPS Take by mouth.    Marland Kitchen FLUoxetine (PROZAC) 40 MG capsule Take 1 capsule (40 mg total) by mouth daily. 90 capsule 0  . furosemide (LASIX) 20 MG tablet Take 1 tablet (20 mg total) by mouth 2 (two) times daily. Prn for swelling 60 tablet 0  . gabapentin (NEURONTIN) 300 MG capsule Take 1 capsule (300 mg total) by mouth 2 (two) times daily.    Marland Kitchen OVER THE COUNTER MEDICATION Ultra joint complex vitamin    . potassium chloride (K-DUR) 10 MEQ tablet Take 1 tablet (10 mEq total) by mouth 2 (two) times daily. Use as directed with Lasix 60 tablet 0  . tapentadol (NUCYNTA) 50 MG tablet Take 1 tablet (50 mg total) by mouth every 12 (twelve) hours as needed for moderate pain. (Patient taking differently: Take 50 mg by mouth 3 (three) times daily. )    . tiZANidine (ZANAFLEX) 4 MG tablet Take 4 mg by mouth 3 (three) times daily as needed for muscle spasms.     . Zinc 50 MG CAPS Take 50 mg by mouth.     No current facility-administered medications for this visit.    Musculoskeletal: Strength & Muscle Tone: within normal limits Gait & Station: normal Patient leans: N/A  Psychiatric Specialty Exam: ROS  Blood pressure (!) 142/90, pulse (!) 102, height 5\' 3"  (1.6 m), weight 237 lb (107.5 kg), SpO2 94 %.Body mass index is 41.98 kg/m.  General Appearance: Casual and Well Groomed  Eye Contact:  Good  Speech:  Clear and Coherent and Normal Rate   Volume:  Normal  Mood:  Euthymic  Affect:  Appropriate and Congruent  Thought Process:  Goal Directed and Descriptions of Associations: Intact  Orientation:  Full (Time, Place, and Person)  Thought Content: Logical   Suicidal Thoughts:  No  Homicidal Thoughts:  No  Memory:  Immediate;   Good  Judgement:  Good  Insight:  Good  Psychomotor Activity:  Normal  Concentration:  Attention Span: Good  Recall:  Good  Fund of Knowledge: Good  Language: Good  Akathisia:  Negative  Handed:  Right  AIMS (if indicated): not done  Assets:  Communication Skills Desire for Improvement Financial Resources/Insurance Housing  ADL's:  Intact  Cognition: WNL  Sleep:  Good   Screenings: PHQ2-9     Patient Outreach Telephone from 04/28/2016 in [facility]  PHQ-2 Total Score  6  PHQ-9 Total Score  12       Assessment and Plan:  MAKENZI BANNISTER presents with gradually improving symptoms, reports that she was doing much better on the Elavil 150 mg dose and wonders about increasing to the maintenance dose of 150 mg.  I reviewed the risks of falls, delirium, electrolyte abnormality, and sedation.  She feels like the benefits outweigh these risks and would like to try as below.  No acute safety concerns or unsafe thoughts towards others.  She is agreeable to proceed with the regimen as below, continued therapy and follow-up in 8 weeks.  1. Severe episode of recurrent major depressive disorder, without psychotic features (HCC)   2. Chronic post-traumatic stress disorder (PTSD)   3. GAD (generalized anxiety disorder)     Status of current problems: gradually improving  Labs Ordered: No orders of the defined types were placed in this encounter.  Labs Reviewed: n/a  Collateral Obtained/Records Reviewed: n/a  Plan:  Prozac 40 mg daily Elavil 150 mg nightly rtc 6-8 weeks  Burnard Leigh, MD 10/03/2017, 3:06 PM

## 2017-10-04 ENCOUNTER — Other Ambulatory Visit: Payer: BLUE CROSS/BLUE SHIELD

## 2017-10-04 ENCOUNTER — Other Ambulatory Visit: Payer: Self-pay | Admitting: Family

## 2017-10-04 ENCOUNTER — Ambulatory Visit (INDEPENDENT_AMBULATORY_CARE_PROVIDER_SITE_OTHER): Payer: BLUE CROSS/BLUE SHIELD | Admitting: Licensed Clinical Social Worker

## 2017-10-04 DIAGNOSIS — R7989 Other specified abnormal findings of blood chemistry: Secondary | ICD-10-CM

## 2017-10-04 DIAGNOSIS — F332 Major depressive disorder, recurrent severe without psychotic features: Secondary | ICD-10-CM

## 2017-10-04 DIAGNOSIS — H40013 Open angle with borderline findings, low risk, bilateral: Secondary | ICD-10-CM | POA: Diagnosis not present

## 2017-10-04 DIAGNOSIS — R509 Fever, unspecified: Secondary | ICD-10-CM

## 2017-10-04 MED FILL — AMLODIPINE BESYLATE 10 MG T: 10 | 30 days supply | Qty: 30 | Fill #1

## 2017-10-04 MED FILL — FAMOTIDINE 20 MG TABLET: 20 | 30 days supply | Qty: 30 | Fill #2

## 2017-10-04 MED FILL — diazePAM 5 MG TABS: 5 | 17 days supply | Qty: 50 | Fill #0

## 2017-10-05 ENCOUNTER — Encounter (HOSPITAL_COMMUNITY): Payer: Self-pay | Admitting: Licensed Clinical Social Worker

## 2017-10-05 ENCOUNTER — Other Ambulatory Visit: Payer: Self-pay | Admitting: Family Medicine

## 2017-10-05 ENCOUNTER — Ambulatory Visit: Payer: BLUE CROSS/BLUE SHIELD | Admitting: Nurse Practitioner

## 2017-10-05 DIAGNOSIS — M79672 Pain in left foot: Secondary | ICD-10-CM

## 2017-10-05 DIAGNOSIS — Z0289 Encounter for other administrative examinations: Secondary | ICD-10-CM

## 2017-10-05 NOTE — Progress Notes (Signed)
Daily Group Progress Note   Program: OP Evening Group    Group Time: 5:30-6:30pm  Participation Level: Active  Behavioral Response: Appropriate  Type of Therapy:  Psychoeducation/Therapy  Summary of Progress: Pt participated in a discussion on vulnerability. Pt shared she thinks being vulnerable is being weak.  The group discussed how vulnerability is not a sign of weakness, but can be the greatest strength. Vulnerability is not weakness; it's the greatest measure of courage. Pt was encouraged to open up her circle to become more vulnerable.  Carly Delacruz 10/04/17 

## 2017-10-11 ENCOUNTER — Ambulatory Visit (HOSPITAL_COMMUNITY): Payer: Self-pay | Admitting: Licensed Clinical Social Worker

## 2017-10-13 ENCOUNTER — Other Ambulatory Visit (INDEPENDENT_AMBULATORY_CARE_PROVIDER_SITE_OTHER): Payer: BLUE CROSS/BLUE SHIELD

## 2017-10-13 ENCOUNTER — Encounter (HOSPITAL_COMMUNITY): Payer: Self-pay | Admitting: Licensed Clinical Social Worker

## 2017-10-13 ENCOUNTER — Ambulatory Visit (INDEPENDENT_AMBULATORY_CARE_PROVIDER_SITE_OTHER): Payer: BLUE CROSS/BLUE SHIELD | Admitting: Licensed Clinical Social Worker

## 2017-10-13 DIAGNOSIS — R300 Dysuria: Secondary | ICD-10-CM | POA: Diagnosis not present

## 2017-10-13 DIAGNOSIS — F332 Major depressive disorder, recurrent severe without psychotic features: Secondary | ICD-10-CM

## 2017-10-13 LAB — URINALYSIS
Bilirubin Urine: NEGATIVE
Hgb urine dipstick: NEGATIVE
Ketones, ur: NEGATIVE
Leukocytes, UA: NEGATIVE
Nitrite: NEGATIVE
Specific Gravity, Urine: 1.025 (ref 1.000–1.030)
Total Protein, Urine: NEGATIVE
Urine Glucose: NEGATIVE
Urobilinogen, UA: 0.2 (ref 0.0–1.0)
pH: 6 (ref 5.0–8.0)

## 2017-10-13 NOTE — Progress Notes (Signed)
   THERAPIST PROGRESS NOTE  Session Time: 3:10-4pm  Participation Level: Active  Behavioral Response: Casual/Alert/anxious  Type of Therapy: Individual Therapy  Treatment Goals addressed: Improve psychiatric symptoms, Controlled Behavior, Moderated Mood, Improve Unhelpful Thought Patterns, Emotional Regulation Skills (Moderate moods, anger management, stress management), Feel and express a full Range of Emotions, Learn about Diagnosis, Healthy Coping Skills.     Interventions: CBT/Motivational Interviewing  Summary: Carly Delacruz is a 52 y.o. female   Suicidal/Homicidal: Nowithout intent/plan  Therapist Response: Pt presents for her individual counseling session. Pt discussed her psychiatric symptoms and current life events. Pt continues to take her medications as prescribed. She has had no further dizziness or blackouts. She has had edema and her PCP is following this. She also has an upcoming appt with her neurologist. Pt has an upcoming appointment with Dr. Rene KocherEksir, psychiatrist, in September. Suggested to pt to continue to follow up with docs about her health; this is part of her self care. Pt reports she has been compulsive buying. She just completed bankruptcy.  Asked her the feelings when she is compulsively buying, "I know it's wrong, but I do it anyway."Asked open ended questions. Discussed her relationship with her mother, which is tenuous. Discussed what coping tools she can  Use when her mother is critical of her. Pt was receptive to suggestions. Discussed how she can keep herself busy and not be home so much around her mother. Suggestions: visiting friends, trips, volunteer work. Pt was receptive to suggestions.       Plan: Return again in 2 weeks for individual therapy and weekly group therapy  Diagnosis: Axis 1: Severe recurrent major depressive disorder     Gor Vestal S, LCAS 10/13/17

## 2017-10-14 LAB — URINE CULTURE
MICRO NUMBER:: 90971102
SPECIMEN QUALITY: ADEQUATE

## 2017-10-18 ENCOUNTER — Ambulatory Visit (HOSPITAL_COMMUNITY): Payer: Self-pay | Admitting: Licensed Clinical Social Worker

## 2017-10-24 DIAGNOSIS — G4700 Insomnia, unspecified: Secondary | ICD-10-CM | POA: Diagnosis not present

## 2017-10-24 DIAGNOSIS — M461 Sacroiliitis, not elsewhere classified: Secondary | ICD-10-CM | POA: Diagnosis not present

## 2017-10-24 DIAGNOSIS — M47816 Spondylosis without myelopathy or radiculopathy, lumbar region: Secondary | ICD-10-CM | POA: Diagnosis not present

## 2017-10-24 DIAGNOSIS — G894 Chronic pain syndrome: Secondary | ICD-10-CM | POA: Diagnosis not present

## 2017-10-24 MED FILL — NUCYNTA 50 MG TABS: 50 | 30 days supply | Qty: 90 | Fill #0

## 2017-10-24 MED FILL — tiZANidine HCL 4 MG TABS: 4 | 30 days supply | Qty: 90 | Fill #1

## 2017-10-25 ENCOUNTER — Ambulatory Visit (HOSPITAL_COMMUNITY): Payer: Self-pay | Admitting: Licensed Clinical Social Worker

## 2017-10-26 ENCOUNTER — Other Ambulatory Visit (INDEPENDENT_AMBULATORY_CARE_PROVIDER_SITE_OTHER): Payer: BLUE CROSS/BLUE SHIELD

## 2017-10-26 ENCOUNTER — Encounter (HOSPITAL_COMMUNITY): Payer: Self-pay | Admitting: Licensed Clinical Social Worker

## 2017-10-26 ENCOUNTER — Ambulatory Visit: Payer: BLUE CROSS/BLUE SHIELD | Admitting: Nurse Practitioner

## 2017-10-26 ENCOUNTER — Ambulatory Visit (INDEPENDENT_AMBULATORY_CARE_PROVIDER_SITE_OTHER): Payer: BLUE CROSS/BLUE SHIELD | Admitting: Licensed Clinical Social Worker

## 2017-10-26 ENCOUNTER — Encounter: Payer: Self-pay | Admitting: Nurse Practitioner

## 2017-10-26 ENCOUNTER — Ambulatory Visit (INDEPENDENT_AMBULATORY_CARE_PROVIDER_SITE_OTHER)
Admission: RE | Admit: 2017-10-26 | Discharge: 2017-10-26 | Disposition: A | Discharge status: Home / Self Care | Payer: BLUE CROSS/BLUE SHIELD | Source: Ambulatory Visit | Attending: Nurse Practitioner | Admitting: Nurse Practitioner

## 2017-10-26 VITALS — BP 128/90 | HR 92 | Ht 63.0 in

## 2017-10-26 DIAGNOSIS — R05 Cough: Secondary | ICD-10-CM | POA: Diagnosis not present

## 2017-10-26 DIAGNOSIS — R059 Cough, unspecified: Secondary | ICD-10-CM

## 2017-10-26 DIAGNOSIS — F332 Major depressive disorder, recurrent severe without psychotic features: Secondary | ICD-10-CM | POA: Diagnosis not present

## 2017-10-26 DIAGNOSIS — J9811 Atelectasis: Secondary | ICD-10-CM | POA: Diagnosis not present

## 2017-10-26 DIAGNOSIS — R600 Localized edema: Secondary | ICD-10-CM | POA: Diagnosis not present

## 2017-10-26 DIAGNOSIS — R0781 Pleurodynia: Secondary | ICD-10-CM

## 2017-10-26 DIAGNOSIS — R55 Syncope and collapse: Secondary | ICD-10-CM

## 2017-10-26 LAB — CBC
HCT: 38.1 % (ref 36.0–46.0)
Hemoglobin: 12.2 g/dL (ref 12.0–15.0)
MCHC: 32 g/dL (ref 30.0–36.0)
MCV: 87.3 fl (ref 78.0–100.0)
Platelets: 287 10*3/uL (ref 150.0–400.0)
RBC: 4.37 Mil/uL (ref 3.87–5.11)
RDW: 14.1 % (ref 11.5–15.5)
WBC: 8.6 10*3/uL (ref 4.0–10.5)

## 2017-10-26 LAB — BASIC METABOLIC PANEL
BUN: 16 mg/dL (ref 6–23)
CALCIUM: 9.3 mg/dL (ref 8.4–10.5)
CO2: 29 meq/L (ref 19–32)
CREATININE: 0.88 mg/dL (ref 0.40–1.20)
Chloride: 99 mEq/L (ref 96–112)
GFR: 86.04 mL/min (ref >60.00)
Glucose, Bld: 109 mg/dL — ABNORMAL HIGH (ref 70–99)
Potassium: 3.7 mEq/L (ref 3.5–5.1)
Sodium: 136 mEq/L (ref 135–145)

## 2017-10-26 NOTE — Progress Notes (Signed)
Name: Carly Delacruz   MRN: [id number]    DOB: 10/23/1965   Date:10/26/2017       Progress Note  Subjective  Chief Complaint Fall, leg swelling  HPI  Carly Delacruz is here today requesting evaluation of a fall and leg swelling. The fall occurred Monday morning, she woke from bed to use the bathroom, was sitting on the toilet urinating and says she noticed feeling lightheaded then completely lost consciousness, woke back up as she fell and hit her head on the floor. She says Her mother heard the fall and came in the bathroom to help her and she then lost consciousness a second time. Both episodes lasted for only seconds.  Since the fall, she has been experiencing pain in her left breast and rib cage every time she breathes and has noticed a dry cough, which is also very painful. She also has several bruises on her legs and has noticed increasing swelling to her BLE this week. She saw another provider in our clinic for pedal edema on 10/03/17, a referral to cardiology was placed and she was started onlasix and potassium on 8/5 which she has been taking. She denies fevers, headaches, chest pain, shortness of breath, abdominal pain, nausea, vomiting. She has not experienced additional syncopal episode or falls since Monday but says she has been somewhat forgetful.  Wt Readings from Last 3 Encounters:  10/03/17 237 lb (107.5 kg)  08/23/17 238 lb (108 kg)  08/04/17 246 lb (111.6 kg)     Patient Active Problem List   Diagnosis Date Noted  . Encounter for general adult medical examination with abnormal findings 08/04/2017  . Depression 05/08/2017  . Glaucoma 05/02/2017  . Chronic back pain 08/12/2016  . Anemia 08/12/2016  . H/O bladder infections 08/12/2016  . S/P gastric bypass 08/12/2016  . Polypharmacy   . Sleep-disordered breathing 07/26/2014  . GERD (gastroesophageal reflux disease) 06/15/2014  . Neuropathy 06/15/2014  . Essential hypertension 06/15/2014  . Obesity 05/08/2013  . Morbid  obesity (HCC) 03/02/2003    Social History   Tobacco Use  . Smoking status: Never Smoker  . Smokeless tobacco: Never Used  Substance Use Topics  . Alcohol use: No     Current Outpatient Medications:  .  acetaminophen (TYLENOL) 500 MG tablet, Take 500 mg by mouth every 6 (six) hours as needed., Disp: , Rfl:  .  amitriptyline (ELAVIL) 150 MG tablet, Take 1 tablet (150 mg total) by mouth at bedtime., Disp: 90 tablet, Rfl: 0 .  amLODipine (NORVASC) 10 MG tablet, Take 10 mg by mouth daily. , Disp: , Rfl:  .  diazepam (VALIUM) 5 MG tablet, Place 5 mg vaginally every 8 (eight) hours as needed (for pelvic floor spasms). , Disp: , Rfl:  .  Estradiol 10 MCG TABS vaginal tablet, , Disp: , Rfl: 12 .  famotidine (PEPCID) 20 MG tablet, Take 20 mg by mouth 2 (two) times daily., Disp: , Rfl:  .  FeFum-FePoly-FA-B Cmp-C-Biot (FOLIVANE-PLUS) CAPS, Take by mouth., Disp: , Rfl:  .  FLUoxetine (PROZAC) 40 MG capsule, Take 1 capsule (40 mg total) by mouth daily., Disp: 90 capsule, Rfl: 0 .  furosemide (LASIX) 20 MG tablet, Take 1 tablet (20 mg total) by mouth 2 (two) times daily. Prn for swelling, Disp: 60 tablet, Rfl: 0 .  gabapentin (NEURONTIN) 300 MG capsule, Take 1 capsule (300 mg total) by mouth 2 (two) times daily., Disp: , Rfl:  .  OVER THE COUNTER MEDICATION, Ultra joint complex vitamin,  Disp: , Rfl:  .  potassium chloride (K-DUR) 10 MEQ tablet, Take 1 tablet (10 mEq total) by mouth 2 (two) times daily. Use as directed with Lasix, Disp: 60 tablet, Rfl: 0 .  tapentadol (NUCYNTA) 50 MG tablet, Take 1 tablet (50 mg total) by mouth every 12 (twelve) hours as needed for moderate pain. (Patient taking differently: Take 50 mg by mouth 3 (three) times daily. ), Disp: , Rfl:  .  tiZANidine (ZANAFLEX) 4 MG tablet, Take 4 mg by mouth 3 (three) times daily as needed for muscle spasms. , Disp: , Rfl:  .  Zinc 50 MG CAPS, Take 50 mg by mouth., Disp: , Rfl:   Allergies  Allergen Reactions  . Morphine And Related  Shortness Of Breath and Itching  . Septra [Sulfamethoxazole-Trimethoprim] Anaphylaxis and Hives  . Sulfa Antibiotics Anaphylaxis, Hives and Swelling    Throat swelling  . Sulfamethoxazole-Trimethoprim Anaphylaxis and Hives  . Sulfasalazine Anaphylaxis, Hives, Swelling and Other (See Comments)    Throat swelling  . Ultram [Tramadol] Shortness Of Breath, Anxiety and Other (See Comments)    "panic attacks"   . Ciprofloxacin Other (See Comments)    This medication was given along with a sulfa. Reaction is unknown at this time. Must list due to possible allergy.  Leodis [place] [Propoxyphene N-Acetaminophen] Itching  . Adhesive [Tape] Rash  . Codeine Itching and Other (See Comments)    No change when benadryl is given  . Demerol [Meperidine] Nausea Only  . Hydromorphone Itching and Other (See Comments)    No change when benadryl is given  . Losartan Palpitations    ROS  No other specific complaints in a complete review of systems (except as listed in HPI above).  Objective  Vitals:   10/26/17 1336  BP: 128/90  Pulse: 92  SpO2: 96%  Height: 5\' 3"  (1.6 m)    Body mass index is 41.98 kg/m.  Nursing Note and Vital Signs reviewed.  Physical Exam  Constitutional: Patient appears well-developed and well-nourished.  HEENT: head atraumatic, normocephalic, pupils equal and reactive to light, EOM's intact,  neck supple without lymphadenopathy, oropharynx pink and moist without exudate Cardiovascular: Normal rate, regular rhythm, S1/S2 present. 1+ pitting edema to BLE. Distal pulses intact Pulmonary /Chest: Effort normal and breath sounds clear. No respiratory distress or retractions. Musculoskeletal: Normal range of motion, No gross deformities Neurological: She is alert and oriented to person, place, and time. No cranial nerve deficit. Coordination, balance, speech are normal. Ambulatory with walker. Skin: Skin is warm and dry. Bruises scattered to BLE Psychiatric: Patient has a normal  mood and affect. behavior is normal. Judgment and thought content normal.   Assessment & Plan RTC for routine follow up  1. Syncope, unspecified syncope type - EKG 12-Lead- unable to obtain EKG during her visit today due to clinic EKG machines being currently out of service Labs today She has active referrals to neurology for ataxia by her psychiatrist and cardiology for pedal edema by another provider in our clinic, I have advised  her to continue with these referrals -Red flags and when to present for emergency care or RTC including syncope, confusion, chest pain, shortness of breath, new/worsening/un-resolving symptoms, reviewed with patient at time of visit. Follow up and care instructions discussed and provided in AVS. F/U with further recommendations pending lab results - CBC; Future - Basic metabolic panel; Future  2. Pedal edema Continue lasix and potassium F/U with cardiology as planned  3. Rib pain Imaging ordered F/U with  further recommendations pending lab results - DG Ribs Unilateral Left; Future - DG Chest 2 View; Future  4. Cough Imaging ordered F/U with further recommendations pending lab results - DG Ribs Unilateral Left; Future - DG Chest 2 View; Future

## 2017-10-26 NOTE — Addendum Note (Signed)
Addended by: Merrilyn PumaSIMMONS, Kasidy Gianino N on: 10/26/2017 04:04 PM   Modules accepted: Orders

## 2017-10-26 NOTE — Progress Notes (Signed)
   THERAPIST PROGRESS NOTE  Session Time: 10:30-11:00am  Participation Level: Active  Behavioral Response: Casual/Alert/depressed  Type of Therapy: Individual Therapy  Treatment Goals addressed: Improve psychiatric symptoms, Controlled Behavior, Moderated Mood, Improve Unhelpful Thought Patterns, Emotional Regulation Skills (Moderate moods, anger management, stress management), Feel and express a full Range of Emotions, Learn about Diagnosis, Healthy Coping Skills.     Interventions: CBT Summary: Carly Delacruz is a 52 y.o. female   Suicidal/Homicidal: Nowithout intent/plan  Therapist Response: Pt presents for her individual counseling session. Pt discussed her psychiatric symptoms and current life events.  Pt can barely walk into the office. Monday night she fell again while in the bathroom and hit the tile floor. EMS was called to pick her up. She is bruised all over and has pain in her side and rib cage. She was going to go to see her PCP after this session. Pt was unable to process much and seemed to have cognitive deficits. Processed with pt her living situation, relationship with her mother and boyfriend who is an alcoholic. Discussed with pt the importance of checking into getting a life alert bracelet for protection when she falls. Shortened the session so pt could seek medical attention from her PCP.        Plan: Return again in 2 weeks for individual therapy and weekly group therapy  Diagnosis: Axis 1: Severe recurrent major depressive disorder     [name],[name], LCAS 10/13/17

## 2017-10-26 NOTE — Patient Instructions (Addendum)
Please head downstairs for lab work/x-rays. If any of your test results are critically abnormal, you will be contacted right away. Otherwise, I will contact you within a week about your test results and follow up recommendations   Syncope Syncope is when you lose temporarily pass out (faint). Signs that you may be about to pass out include:  Feeling dizzy or light-headed.  Feeling sick to your stomach (nauseous).  Seeing all white or all black.  Having cold, clammy skin.  If you passed out, get help right away. Call your local emergency services (911 in the [place] Do not drive yourself to the hospital. Follow these instructions at home: Pay attention to any changes in your symptoms. Take these actions to help with your condition:  Have someone stay with you until you feel stable.  Do not drive, use machinery, or play sports until your doctor says it is okay.  Keep all follow-up visits as told by your doctor. This is important.  If you start to feel like you might pass out, lie down right away and raise (elevate) your feet above the level of your heart. Breathe deeply and steadily. Wait until all of the symptoms are gone.  Drink enough fluid to keep your pee (urine) clear or pale yellow.  If you are taking blood pressure or heart medicine, get up slowly and spend many minutes getting ready to sit and then stand. This can help with dizziness.  Take over-the-counter and prescription medicines only as told by your doctor.  Get help right away if:  You have a very bad headache.  You have unusual pain in your chest, tummy, or back.  You are bleeding from your mouth or rectum.  You have black or tarry poop (stool).  You have a very fast or uneven heartbeat (palpitations).  It hurts to breathe.  You pass out once or more than once.  You have jerky movements that you cannot control (seizure).  You are confused.  You have trouble walking.  You are very weak.  You have  vision problems. These symptoms may be an emergency. Do not wait to see if the symptoms will go away. Get medical help right away. Call your local emergency services (911 in the [place] Do not drive yourself to the hospital. This information is not intended to replace advice given to you by your health care provider. Make sure you discuss any questions you have with your health care provider. Document Released: 08/04/2007 Document Revised: 07/24/2015 Document Reviewed: 10/30/2014 Elsevier Interactive Patient Education  [employer]2018 [employer]

## 2017-10-27 NOTE — Addendum Note (Signed)
Addended by: Vernona RiegerMACKENZIE, [name] on: 10/27/2017 05:20 PM   Modules accepted: Level of Service

## 2017-10-28 ENCOUNTER — Other Ambulatory Visit: Payer: Self-pay

## 2017-10-28 ENCOUNTER — Ambulatory Visit: Payer: Self-pay | Admitting: Nurse Practitioner

## 2017-10-28 NOTE — Telephone Encounter (Signed)
Spoke with patient who called with c/o onset today of  left foot swelling and redness.  From the toes to the ankle. Sore to touch; 7/10. Reports recent fall monday26th and seen by PCP on 10/26/17. In completing triage call and attempting to schedule appointment for re evaluation, noted pt has MRI left foot scheduled today ordered by Dr Althea CharonMckinley orthopedist.  Pt was unaware of appointment and stated she was unable to keep it. Contacted [facility] for patient and transferred her to that office for further assessment. Reason for Disposition . [1] Red area or streak [2] large (> 2 in. or 5 cm)    Pt currently under [facility] by [facility].  Phone made and patient transferred to this office for evaluation.  Answer Assessment - Initial Assessment Questions 1. ONSET: "When did the swelling start?" (e.g., minutes, hours, days)     This am 10/28/17 2. LOCATION: "What part of the leg is swollen?"  "Are both legs swollen or just one leg?"     Lt foot and ankle 3. SEVERITY: "How bad is the swelling?" (e.g., localized; mild, moderate, severe)  - Localized - small area of swelling localized to one leg  - MILD pedal edema - swelling limited to foot and ankle, pitting edema < 1/4 inch (6 mm) deep, rest and elevation eliminate most or all swelling  - MODERATE edema - swelling of lower leg to knee, pitting edema > 1/4 inch (6 mm) deep, rest and elevation only partially reduce swelling  - SEVERE edema - swelling extends above knee, facial or hand swelling present      mild 4. REDNESS: "Does the swelling look red or infected?"     Red and itching 5. PAIN: "Is the swelling painful to touch?" If so, ask: "How painful is it?"   (Scale 1-10; mild, moderate or severe)     severe 6. FEVER: "Do you have a fever?" If so, ask: "What is it, how was it measured, and when did it start?"      no 7. CAUSE: "What do you think is causing the leg swelling?"     nothing 8. MEDICAL HISTORY: "Do  you have a history of heart failure, kidney disease, liver failure, or cancer?"     No but has cardiologist appt to set up per PCP 9. RECURRENT SYMPTOM: "Have you had leg swelling before?" If so, ask: "When was the last time?" "What happened that time?"     After fall on Monday 10. OTHER SYMPTOMS: "Do you have any other symptoms?" (e.g., chest pain, difficulty breathing)       no  Protocols used: LEG SWELLING AND EDEMA-A-AH

## 2017-11-01 ENCOUNTER — Ambulatory Visit (INDEPENDENT_AMBULATORY_CARE_PROVIDER_SITE_OTHER): Payer: BLUE CROSS/BLUE SHIELD | Admitting: Licensed Clinical Social Worker

## 2017-11-01 DIAGNOSIS — F332 Major depressive disorder, recurrent severe without psychotic features: Secondary | ICD-10-CM | POA: Diagnosis not present

## 2017-11-02 ENCOUNTER — Ambulatory Visit: Payer: BLUE CROSS/BLUE SHIELD | Admitting: Internal Medicine

## 2017-11-02 ENCOUNTER — Encounter: Payer: Self-pay | Admitting: Internal Medicine

## 2017-11-02 ENCOUNTER — Encounter (HOSPITAL_COMMUNITY): Payer: Self-pay | Admitting: Licensed Clinical Social Worker

## 2017-11-02 VITALS — BP 128/90 | HR 110 | Ht 63.0 in | Wt 245.0 lb

## 2017-11-02 DIAGNOSIS — K219 Gastro-esophageal reflux disease without esophagitis: Secondary | ICD-10-CM | POA: Diagnosis not present

## 2017-11-02 DIAGNOSIS — R1030 Lower abdominal pain, unspecified: Secondary | ICD-10-CM

## 2017-11-02 DIAGNOSIS — R195 Other fecal abnormalities: Secondary | ICD-10-CM | POA: Diagnosis not present

## 2017-11-02 DIAGNOSIS — Z9884 Bariatric surgery status: Secondary | ICD-10-CM | POA: Diagnosis not present

## 2017-11-02 MED ORDER — FAMOTIDINE 20 MG PO TABS: 20.0000 mg | ORAL_TABLET | Freq: Two times a day (BID) | ORAL | 3 refills | Status: DC

## 2017-11-02 MED ORDER — DICYCLOMINE HCL 20 MG PO TABS: 20.0000 mg | ORAL_TABLET | Freq: Two times a day (BID) | ORAL | 3 refills | Status: DC

## 2017-11-02 MED FILL — DICYCLOMINE 20 MG TABLET: 20 | 30 days supply | Qty: 60 | Fill #0

## 2017-11-02 MED FILL — FAMOTIDINE 20 MG TABLET: 20 | 30 days supply | Qty: 60 | Fill #0

## 2017-11-02 NOTE — Progress Notes (Signed)
Patient ID: Carly Delacruz, female   DOB: 1965-09-03, 52 y.o.   MRN: [id number] HPI: Carly Delacruz is a 52 year old female with a past medical history of obesity status post gastric bypass surgery, history of levator ani syndrome/pelvic floor dysfunction, GERD, sleep apnea, anxiety and depression, chronic pain who is seen in consultation at the request of Alphonse Guild, NP to evaluate loose stools.  She is here today with her mother.    She is known to me from evaluation in 2015 when she was seen for left lower quadrant pain, change in bowel habits, early satiety and nausea.  This led to upper endoscopy which was unremarkable except for Roux-en-Y anatomy.  And colonoscopy which was also normal.  These procedures were performed in March and April 2015.  She reports that she is having intermittent episodes of loose stools.  Overall this is better than when she discussed this with primary care in March.  She states though that on most days she will have loose and somewhat urgent stools after eating.  She is only eating primarily lunch and dinner each day.  She rarely if ever eats breakfast.  She describes the stools as urgent occurring 20 to 30 minutes after eating.  Stools are nonbloody and non-melenic.  There can be lower abdominal crampy discomfort.  He is not aware of any definitive food triggers for the symptoms.  There are some days when stools are formed and regular throughout the day.  She does have a history of heartburn and is taking famotidine 20 mg in the morning.  She is noticed that if she has any heartburn it will occur late in the evening near bedtime.  This is described as traditional heartburn without dysphagia or odynophagia.  No nausea.  She does sleep on a wedge.  She reports that she is having some hallucination and her mother states that she at times feels like there is someone else in the house that she sees.  She is discussing this with her psychiatrist.  Her amitriptyline was  recently increased to 150 mg nightly.  She also uses occasional diazepam as needed for pelvic floor spasm.  She takes fluoxetine 40 mg daily.  Gabapentin 300 mg twice daily.  Occasionally she will use tapentadol 50 mg every 12 hours for pain.   Past Medical History:  Diagnosis Date  . Allergy   . Anemia   . Anxiety   . Arthritis 12/10/03  . Atrophic vaginitis 2009  . BV (bacterial vaginosis) 2006  . Chronic back pain   . Chronic LLQ pain   . Complication of anesthesia   . Elevated LFTs   . Endometriosis   . Fatigue 2012  . Gallstones   . GERD (gastroesophageal reflux disease)   . H/O bladder infections   . H/O mumps   . H/O varicella   . History of candidal vulvovaginitis 03/21/03  . HTN (hypertension)   . Insomnia   . Insomnia secondary to situational depression 06/06/07  . Liver mass, right lobe 08/13/04   Nodule  . Malaise 2012  . Morbid obesity (HCC) 2005  . Osteoporosis   . S/P gastric bypass   . Sleep apnea 2005  . SUI (stress urinary incontinence, female) 08/12/03  . Trichomonas   . Vulvar lesion 03/21/03  . Yeast infection     Past Surgical History:  Procedure Laterality Date  . ABDOMINAL HYSTERECTOMY  2001  . APPENDECTOMY  2001  . CHOLECYSTECTOMY  2011  . DILATION AND CURETTAGE OF  UTERUS    . GASTRIC BYPASS  2007   [facility], Dr. Daphine Deutscher  . KNEE ARTHROSCOPY Left   . LAPAROTOMY    . TONSILLECTOMY    . WISDOM TOOTH EXTRACTION      Outpatient Medications Prior to Visit  Medication Sig Dispense Refill  . amitriptyline (ELAVIL) 150 MG tablet Take 1 tablet (150 mg total) by mouth at bedtime. 90 tablet 0  . amLODipine (NORVASC) 10 MG tablet Take 10 mg by mouth daily.     . diazepam (VALIUM) 5 MG tablet Place 5 mg vaginally every 8 (eight) hours as needed (for pelvic floor spasms).     . Estradiol 10 MCG TABS vaginal tablet   12  . FeFum-FePoly-FA-B Cmp-C-Biot (FOLIVANE-PLUS) CAPS Take by mouth.    Marland Kitchen FLUoxetine (PROZAC) 40 MG capsule Take 1 capsule  (40 mg total) by mouth daily. 90 capsule 0  . furosemide (LASIX) 20 MG tablet Take 1 tablet (20 mg total) by mouth 2 (two) times daily. Prn for swelling 60 tablet 0  . gabapentin (NEURONTIN) 300 MG capsule Take 1 capsule (300 mg total) by mouth 2 (two) times daily.    Marland Kitchen OVER THE COUNTER MEDICATION Ultra joint complex vitamin    . potassium chloride (K-DUR) 10 MEQ tablet Take 1 tablet (10 mEq total) by mouth 2 (two) times daily. Use as directed with Lasix 60 tablet 0  . tapentadol (NUCYNTA) 50 MG tablet Take 1 tablet (50 mg total) by mouth every 12 (twelve) hours as needed for moderate pain. (Patient taking differently: Take 50 mg by mouth 3 (three) times daily. )    . tiZANidine (ZANAFLEX) 4 MG tablet Take 4 mg by mouth 3 (three) times daily as needed for muscle spasms.     . Zinc 50 MG CAPS Take 50 mg by mouth.    . famotidine (PEPCID) 20 MG tablet Take 20 mg by mouth 2 (two) times daily.    Marland Kitchen acetaminophen (TYLENOL) 500 MG tablet Take 500 mg by mouth every 6 (six) hours as needed.     No facility-administered medications prior to visit.     Allergies  Allergen Reactions  . Morphine And Related Shortness Of Breath and Itching  . Septra [Sulfamethoxazole-Trimethoprim] Anaphylaxis and Hives  . Sulfa Antibiotics Anaphylaxis, Hives and Swelling    Throat swelling  . Sulfamethoxazole-Trimethoprim Anaphylaxis and Hives  . Sulfasalazine Anaphylaxis, Hives, Swelling and Other (See Comments)    Throat swelling  . Ultram [Tramadol] Shortness Of Breath, Anxiety and Other (See Comments)    "panic attacks"   . Ciprofloxacin Other (See Comments)    This medication was given along with a sulfa. Reaction is unknown at this time. Must list due to possible allergy.  Leodis [place] [Propoxyphene N-Acetaminophen] Itching  . Adhesive [Tape] Rash  . Codeine Itching and Other (See Comments)    No change when benadryl is given  . Demerol [Meperidine] Nausea Only  . Hydromorphone Itching and Other (See  Comments)    No change when benadryl is given  . Losartan Palpitations    Family History  Problem Relation Age of Onset  . Diabetes Paternal Grandmother   . Leukemia Maternal Grandmother   . Arthritis Mother   . Prostate cancer Father   . Colitis Sister     Social History   Tobacco Use  . Smoking status: Never Smoker  . Smokeless tobacco: Never Used  Substance Use Topics  . Alcohol use: No  . Drug use: No  ROS: As per history of present illness, otherwise negative  BP 128/90   Pulse (!) 110   Ht 5\' 3"  (1.6 m)   Wt 245 lb (111.1 kg)   SpO2 96%   BMI 43.40 kg/m  Constitutional: Well-developed and well-nourished. No distress. HEENT: Normocephalic and atraumatic. Oropharynx is clear and moist. Conjunctivae are normal.  No scleral icterus. Neck: Neck supple. Trachea midline. Cardiovascular: Normal rate, regular rhythm and intact distal pulses.  Pulmonary/chest: Effort normal and breath sounds normal. No wheezing, rales or rhonchi. Abdominal: Soft, obese, usually tender to palpation without rebound or guarding, nondistended. Bowel sounds active throughout.  Extremities: no clubbing, cyanosis, trace to 1+ lower extremity edema to the midshin Neurological: Alert and oriented to person place and time. Skin: Skin is warm and dry.  Psychiatric: Normal mood and somewhat flat affect. Behavior is normal.  RELEVANT LABS AND IMAGING: CBC    Component Value Date/Time   WBC 8.6 10/26/2017 1557   RBC 4.37 10/26/2017 1557   HGB 12.2 10/26/2017 1557   HGB 13.4 04/29/2016 1222   HCT 38.1 10/26/2017 1557   HCT 41.7 04/29/2016 1222   PLT 287.0 10/26/2017 1557   PLT 308 04/29/2016 1222   MCV 87.3 10/26/2017 1557   MCV 87 04/29/2016 1222   MCH 27.1 08/16/2016 0622   MCHC 32.0 10/26/2017 1557   RDW 14.1 10/26/2017 1557   RDW 13.9 04/29/2016 1222   LYMPHSABS 1.4 10/03/2017 1456   MONOABS 0.6 10/03/2017 1456   EOSABS 0.0 10/03/2017 1456   BASOSABS 0.1 10/03/2017 1456     CMP     Component Value Date/Time   NA 136 10/26/2017 1557   NA 144 04/29/2016 1222   K 3.7 10/26/2017 1557   CL 99 10/26/2017 1557   CO2 29 10/26/2017 1557   GLUCOSE 109 (H) 10/26/2017 1557   BUN 16 10/26/2017 1557   BUN 14 04/29/2016 1222   CREATININE 0.88 10/26/2017 1557   CALCIUM 9.3 10/26/2017 1557   PROT 7.5 10/03/2017 1456   ALBUMIN 4.2 10/03/2017 1456   AST 16 10/03/2017 1456   ALT 18 10/03/2017 1456   ALKPHOS 100 10/03/2017 1456   BILITOT 0.2 10/03/2017 1456   GFRNONAA >60 08/16/2016 0622   GFRAA >60 08/16/2016 0622   CT ABDOMEN AND PELVIS WITH CONTRAST   TECHNIQUE: Multidetector CT imaging of the abdomen and pelvis was performed using the standard protocol following bolus administration of intravenous contrast.   CONTRAST:  ISOVUE-300 IOPAMIDOL (ISOVUE-300) INJECTION 61%   COMPARISON:  05/01/2013   FINDINGS: Lower chest:  No acute findings.   Hepatobiliary: Small cyst in the inferior right hepatic lobe remains stable. No liver masses are identified. Prior cholecystectomy noted. No evidence biliary ductal dilatation.   Pancreas: No mass, inflammatory changes, or other significant abnormality.   Spleen: Within normal limits in size and appearance.   Adrenals/Urinary Tract: No masses identified. Tiny sub-cm cyst seen in upper pole of left kidney. No evidence of hydronephrosis. Unopacified urinary bladder is unremarkable in appearance.   Stomach/Bowel: No evidence of obstruction, inflammatory process, or abnormal fluid collections. Postop changes seen from previous gastric bypass surgery. Moderate stool noted in rectosigmoid colon.   Vascular/Lymphatic: No pathologically enlarged lymph nodes. No evidence of abdominal aortic aneurysm.   Reproductive: Prior hysterectomy noted. Adnexal regions are unremarkable in appearance.   Other: None.   Musculoskeletal:  No suspicious bone lesions identified.   IMPRESSION: No acute findings within  the abdomen or pelvis. Moderate stool noted in rectosigmoid  colon.     Electronically Signed   By: Myles Rosenthal M.D.   On: 08/20/2015 13:02    ASSESSMENT/PLAN: 52 year old female with a past medical history of obesity status post gastric bypass surgery, history of levator ani syndrome/pelvic floor dysfunction, GERD, sleep apnea, anxiety and depression, chronic pain who is seen in consultation at the request of Alphonse Guild, NP to evaluate loose stools.   1.  Intermittent, postprandial, loose stools --differential includes irritable bowel, early dumping syndrome, medication related side effect.  Symptoms are intermittent and do not sound infectious in nature.  She has a long-standing history of loose stools and diarrhea.  She had cross-sectional imaging 2 years ago for lower abdominal pain with loose stools which showed no acute findings.  She is also up-to-date with colonoscopy --I recommended we try Bentyl 20 mg twice daily before meals.  This should help control loose stools, fecal urgency and also crampy abdominal pain. --I also recommended she keep a food diary/journal to determine if there are any definitive trigger foods for her postprandial symptoms --To avoid early dumping syndrome I advised that she avoid foods high in simple sugar.  Foods with complex carbohydrates and protein and foods high in fiber should help avoid the symptoms.  Also eating smaller more frequent meals can help avoid the symptoms. --Follow-up in about 8 weeks to determine improvement  2.  GERD --some heartburn symptoms at night.  I explained that famotidine is a 10-hour medication and can be used both in the morning and at bedtime.-She will use famotidine 20 mg each morning and a second dose in the evening if needed.  No alarm symptoms.  No history of Barrett's esophagus.  Normal upper endoscopy with Roux-en-Y anatomy in 2015  3.  CRC screening --normal colonoscopy in 2015, repeat in 2025    ZO:[id number],  Audie Box, Np [address] [place], [place] [address]

## 2017-11-02 NOTE — Patient Instructions (Signed)
Please follow up with one of our Advanced Practice Providers Willette Cluster, NP; Amy Navarino, PA-C; Hyacinth Meeker, PA-C; or Doug Sou, PA-C) in 8 weeks. Call our office at [phone] to schedule.  We have sent the following medications to your pharmacy for you to pick up at your convenience: Famotidine 20 mg twice daily Bentyl 20 mg twice daily before meals for loose stool, crampy abdominal pain  Please keep a food journal and share this with [place] when you return at your next office visit.  If you are age 33 or older, your body mass index should be between 23-30. Your Body mass index is 43.4 kg/m. If this is out of the aforementioned range listed, please consider follow up with your Primary Care Provider.  If you are age 83 or younger, your body mass index should be between 19-25. Your Body mass index is 43.4 kg/m. If this is out of the aformentioned range listed, please consider follow up with your Primary Care Provider.

## 2017-11-02 NOTE — Progress Notes (Signed)
Daily Group Progress Note            Program: OP Evening Group    Group Time: 5:30-6:30pm  Participation Level: Active  Behavioral Response: Appropriate  Type of Therapy:  Psychoeducation/Therapy  Summary of Progress: Pt participated in a continued discussion on relationships and vulnerability. Vulnerability is difficult in relationships because people can see our flaws. Pt was encouraged to find courage to become more vulnerable.  Tacy Chavis, LCAS  

## 2017-11-04 ENCOUNTER — Other Ambulatory Visit: Payer: Self-pay | Admitting: Nurse Practitioner

## 2017-11-08 ENCOUNTER — Other Ambulatory Visit: Payer: Self-pay | Admitting: Nurse Practitioner

## 2017-11-09 ENCOUNTER — Other Ambulatory Visit: Payer: Self-pay

## 2017-11-15 ENCOUNTER — Ambulatory Visit: Payer: Self-pay | Admitting: Nurse Practitioner

## 2017-11-15 NOTE — Telephone Encounter (Signed)
I returned her call.  She was seen by Alphonse GuildAshleigh Shambley on 10/26/17 for swelling in her leg.  She is saying her left leg is more swollen and painful now.   She has seen Dr. Crecencio McMcKenley who has ordered an MRI for this Thursday.   He thinks she has a stress fracture.  See triage notes.   I have scheduled her with Alphonse GuildAshleigh Shambley, AGPCNP-BC for 11/16/17 at 8:00am.    Reason for Disposition . [1] MODERATE leg swelling (e.g., swelling extends up to knees) AND [2] new onset or worsening  Answer Assessment - Initial Assessment Questions 1. ONSET: "When did the swelling start?" (e.g., minutes, hours, days)     Off and on for a couple of weeks.   It's both feet that are swollen.   I went to PT August 1st he said not to do PT that day.   I saw Dr. Crecencio McMcKenley he put a brace on it.   Keep for 2 weeks.  Went to see him and he did a cortisone shot.    I'm for a MRI Thursday.    I saw Vernona RiegerLaura around 1st of August.   She said to see a cardiologist.   Then I saw Apolonio Schneidersshleigh and she ordered labs.    My left leg is not getting better it's getting worse. 2. LOCATION: "What part of the leg is swollen?"  "Are both legs swollen or just one leg?"     Left is swollen in foot, toes and up my calf.   Hurts.  Not warm.  It is red and I have a lot of edema. 3. SEVERITY: "How bad is the swelling?" (e.g., localized; mild, moderate, severe)  - Localized - small area of swelling localized to one leg  - MILD pedal edema - swelling limited to foot and ankle, pitting edema < 1/4 inch (6 mm) deep, rest and elevation eliminate most or all swelling  - MODERATE edema - swelling of lower leg to knee, pitting edema > 1/4 inch (6 mm) deep, rest and elevation only partially reduce swelling  - SEVERE edema - swelling extends above knee, facial or hand swelling present      See above 4. REDNESS: "Does the swelling look red or infected?"     It's on top of foot radiating to ankle on left. 5. PAIN: "Is the swelling painful to touch?" If so, ask: "How  painful is it?"   (Scale 1-10; mild, moderate or severe)     Painful all the time.  I've tried Voltaren and it did not help. 6. FEVER: "Do you have a fever?" If so, ask: "What is it, how was it measured, and when did it start?"      Overall just not feeling well. 7. CAUSE: "What do you think is causing the leg swelling?"     I did not hurt it.   Dr. Lottie RaterMcKenly said I had a recurrent stress fracture.   Until he sees what MRI shows he can't do anything.   8. MEDICAL HISTORY: "Do you have a history of heart failure, kidney disease, liver failure, or cancer?"     I do have kidney trouble.  I was in the hospital June 14th due to infection in my kidney.    9. RECURRENT SYMPTOM: "Have you had leg swelling before?" If so, ask: "When was the last time?" "What happened that time?"     No.   I have chronic back pain.   10. OTHER SYMPTOMS: "Do you  have any other symptoms?" (e.g., chest pain, difficulty breathing)       No to above. 11. PREGNANCY: "Is there any chance you are pregnant?" "When was your last menstrual period?"       Hysterectomy  Protocols used: LEG SWELLING AND EDEMA-A-AH

## 2017-11-16 ENCOUNTER — Ambulatory Visit (HOSPITAL_COMMUNITY)
Admission: RE | Admit: 2017-11-16 | Discharge: 2017-11-16 | Disposition: A | Discharge status: Home / Self Care | Payer: BLUE CROSS/BLUE SHIELD | Source: Ambulatory Visit | Attending: Nurse Practitioner | Admitting: Nurse Practitioner

## 2017-11-16 ENCOUNTER — Ambulatory Visit: Payer: BLUE CROSS/BLUE SHIELD | Admitting: Nurse Practitioner

## 2017-11-16 ENCOUNTER — Encounter: Payer: Self-pay | Admitting: Nurse Practitioner

## 2017-11-16 ENCOUNTER — Other Ambulatory Visit (INDEPENDENT_AMBULATORY_CARE_PROVIDER_SITE_OTHER): Payer: BLUE CROSS/BLUE SHIELD

## 2017-11-16 VITALS — BP 138/90 | HR 112 | Ht 63.0 in

## 2017-11-16 DIAGNOSIS — R7989 Other specified abnormal findings of blood chemistry: Secondary | ICD-10-CM | POA: Diagnosis not present

## 2017-11-16 DIAGNOSIS — M79605 Pain in left leg: Secondary | ICD-10-CM

## 2017-11-16 DIAGNOSIS — R600 Localized edema: Secondary | ICD-10-CM

## 2017-11-16 DIAGNOSIS — Z23 Encounter for immunization: Secondary | ICD-10-CM | POA: Diagnosis not present

## 2017-11-16 LAB — URIC ACID: Uric Acid, Serum: 5.6 mg/dL (ref 2.4–7.0)

## 2017-11-16 MED FILL — GABAPENTIN 300 MG CAPSULE: 300 | 30 days supply | Qty: 60 | Fill #1

## 2017-11-16 MED FILL — tiZANidine HCL 4 MG TABS: 4 | 30 days supply | Qty: 90 | Fill #2

## 2017-11-16 NOTE — Progress Notes (Signed)
Name: Carly Delacruz   MRN: [id number]    DOB: 1965-03-09   Date:11/16/2017       Progress Note  Subjective  Chief Complaint Leg swelling  HPI  Ms Botkins is here today for c/o left foot and leg pain and swelling, began around July, was seen here in August by another provider for bilateral lower extremity swelling and her BNP was elevated, she was referred to cardiology but not heard about referral yet. She reports the right lower extremity swelling has improved but left continues to be swollen from ankle to foot, she actually is seeing orthopedics for evaluation of this with an MRI scheduled tomorrow to rule out stress fracture, however swelling seemed to become worse yesterday and her left foot and ankle are red so she called for appointment in our office. She describes the pain in her foot and ankle as burning/pressure radiating from her left foot to left knee. She denies fevers, chest pain, shortness of breath, wheezing. She has tried miracle rub, voltaren, nucynta for her pain without much relief.   Patient Active Problem List   Diagnosis Date Noted  . Encounter for general adult medical examination with abnormal findings 08/04/2017  . Depression 05/08/2017  . Glaucoma 05/02/2017  . Chronic back pain 08/12/2016  . Anemia 08/12/2016  . H/O bladder infections 08/12/2016  . S/P gastric bypass 08/12/2016  . Polypharmacy   . Sleep-disordered breathing 07/26/2014  . GERD (gastroesophageal reflux disease) 06/15/2014  . Neuropathy 06/15/2014  . Essential hypertension 06/15/2014  . Obesity 05/08/2013  . Morbid obesity (HCC) 03/02/2003    Social History   Tobacco Use  . Smoking status: Never Smoker  . Smokeless tobacco: Never Used  Substance Use Topics  . Alcohol use: No     Current Outpatient Medications:  .  amitriptyline (ELAVIL) 150 MG tablet, Take 1 tablet (150 mg total) by mouth at bedtime., Disp: 90 tablet, Rfl: 0 .  amLODipine (NORVASC) 10 MG tablet, Take 10 mg by  mouth daily. , Disp: , Rfl:  .  diazepam (VALIUM) 5 MG tablet, Place 5 mg vaginally every 8 (eight) hours as needed (for pelvic floor spasms). , Disp: , Rfl:  .  dicyclomine (BENTYL) 20 MG tablet, Take 1 tablet (20 mg total) by mouth 2 (two) times daily before a meal. For loose stool an crampy abdominal pain, Disp: 60 tablet, Rfl: 3 .  Estradiol 10 MCG TABS vaginal tablet, , Disp: , Rfl: 12 .  famotidine (PEPCID) 20 MG tablet, Take 1 tablet (20 mg total) by mouth 2 (two) times daily., Disp: 60 tablet, Rfl: 3 .  FeFum-FePoly-FA-B Cmp-C-Biot (FOLIVANE-PLUS) CAPS, Take by mouth., Disp: , Rfl:  .  FLUoxetine (PROZAC) 40 MG capsule, Take 1 capsule (40 mg total) by mouth daily., Disp: 90 capsule, Rfl: 0 .  furosemide (LASIX) 20 MG tablet, Take 1 tablet (20 mg total) by mouth 2 (two) times daily. Prn for swelling, Disp: 60 tablet, Rfl: 0 .  gabapentin (NEURONTIN) 300 MG capsule, Take 1 capsule (300 mg total) by mouth 2 (two) times daily., Disp: , Rfl:  .  OVER THE COUNTER MEDICATION, Ultra joint complex vitamin, Disp: , Rfl:  .  potassium chloride (K-DUR) 10 MEQ tablet, Take 1 tablet (10 mEq total) by mouth 2 (two) times daily. Use as directed with Lasix, Disp: 60 tablet, Rfl: 0 .  tapentadol (NUCYNTA) 50 MG tablet, Take 1 tablet (50 mg total) by mouth every 12 (twelve) hours as needed for moderate pain. (Patient taking  differently: Take 50 mg by mouth 3 (three) times daily. ), Disp: , Rfl:  .  tiZANidine (ZANAFLEX) 4 MG tablet, Take 4 mg by mouth 3 (three) times daily as needed for muscle spasms. , Disp: , Rfl:  .  Zinc 50 MG CAPS, Take 50 mg by mouth., Disp: , Rfl:   Allergies  Allergen Reactions  . Morphine And Related Shortness Of Breath and Itching  . Septra [Sulfamethoxazole-Trimethoprim] Anaphylaxis and Hives  . Sulfa Antibiotics Anaphylaxis, Hives and Swelling    Throat swelling  . Sulfamethoxazole-Trimethoprim Anaphylaxis and Hives  . Sulfasalazine Anaphylaxis, Hives, Swelling and Other (See  Comments)    Throat swelling  . Ultram [Tramadol] Shortness Of Breath, Anxiety and Other (See Comments)    "panic attacks"   . Ciprofloxacin Other (See Comments)    This medication was given along with a sulfa. Reaction is unknown at this time. Must list due to possible allergy.  Leodis Liverpool. Darvocet [Propoxyphene N-Acetaminophen] Itching  . Adhesive [Tape] Rash  . Codeine Itching and Other (See Comments)    No change when benadryl is given  . Demerol [Meperidine] Nausea Only  . Hydromorphone Itching and Other (See Comments)    No change when benadryl is given  . Losartan Palpitations    ROS   No other specific complaints in a complete review of systems (except as listed in HPI above).  Objective  Vitals:   11/16/17 0759  BP: 138/90  Pulse: (!) 112  SpO2: 95%  Height: 5\' 3"  (1.6 m)    Body mass index is 43.4 kg/m.  Nursing Note and Vital Signs reviewed.  Physical Exam  Constitutional: Patient appears well-developed and well-nourished.  No distress.  HEENT: head atraumatic, normocephalic, pupils equal and reactive to light, EOM's intact, neck supple, oropharynx pink and moist without exudate Cardiovascular: Normal rate, regular rhythm, distal pulses intact. Swelling to LLE. Pulmonary/Chest: Effort normal. No respiratory distress or retractions. Musculoskeletal: Left foot and ankle swelling, erythema, tenderness. Neurological: She is alert and oriented to person, place, and time. No cranial nerve deficit. Coordination, balance, strength, speech  are normal. Ambulatory with cane. Skin: Skin is warm and dry. No rash noted.  Psychiatric: Patient has a normal mood and affect. behavior is normal. Judgment and thought content normal.   Assessment & Plan  -Reviewed Health Maintenance:  Need for influenza vaccination- Flu Vaccine QUAD 36+ mos IM  Pedal edema Due to increasing pain, swelling and redness will order US today to R/o DVT, blockage F/U with further recommendations  pending results Continue F/U with orthopedics for MRI as planned - VAS US LOWER EXTREMITY VENOUS (DVT); Future  Pain of left lower extremity Due to increasing pain, swelling and redness will order US today to R/o DVT, blockage; uric acid to r/o gout F/U with further recommendations pending results Continue F/U with orthopedics for MRI as planned - VAS US LOWER EXTREMITY VENOUS (DVT); Future - Uric acid; Future  Elevated brain natriuretic peptide (BNP) level Provided pt with cardiology number to schedule appointment for f/u of recently elevated BNP

## 2017-11-16 NOTE — Progress Notes (Signed)
Preliminary notes--Left lower extremity venous duplex exam completed. Negative for DVT where veins visualized. Limited study due to patient body habitus, calf veins not well visualized, cannot completely exclude possibility of calf veins thrombosis. Result called the ordering physician's office spoke with Ms. Ladona Ridgelaylor, and patient was instructed to go home. Will e-fax the result to NP [facility]. Hongying Cassandra Harbold (RDMS RVT) 11/16/17 10:58 AM

## 2017-11-16 NOTE — Patient Instructions (Addendum)
We have placed an order for an urgent ultrasound of your left leg. This is scheduled to be done at [facility] today at 10:00, please arrive at their front entrance at 9:45 for check in.  Please keep your MRI appointment tomorrow as planned  Please call Cardiology to schedule appointment for your heart: [phone]  I will let you know when I get your ultrasound results back.

## 2017-11-17 ENCOUNTER — Ambulatory Visit (HOSPITAL_COMMUNITY): Payer: Self-pay | Admitting: Licensed Clinical Social Worker

## 2017-11-17 ENCOUNTER — Ambulatory Visit
Admission: RE | Admit: 2017-11-17 | Discharge: 2017-11-17 | Disposition: A | Discharge status: Home / Self Care | Payer: BLUE CROSS/BLUE SHIELD | Source: Ambulatory Visit | Attending: Family Medicine | Admitting: Family Medicine

## 2017-11-17 DIAGNOSIS — R600 Localized edema: Secondary | ICD-10-CM | POA: Diagnosis not present

## 2017-11-17 DIAGNOSIS — M79672 Pain in left foot: Secondary | ICD-10-CM

## 2017-11-21 DIAGNOSIS — M461 Sacroiliitis, not elsewhere classified: Secondary | ICD-10-CM | POA: Diagnosis not present

## 2017-11-21 DIAGNOSIS — G894 Chronic pain syndrome: Secondary | ICD-10-CM | POA: Diagnosis not present

## 2017-11-21 DIAGNOSIS — G4700 Insomnia, unspecified: Secondary | ICD-10-CM | POA: Diagnosis not present

## 2017-11-21 DIAGNOSIS — M47816 Spondylosis without myelopathy or radiculopathy, lumbar region: Secondary | ICD-10-CM | POA: Diagnosis not present

## 2017-11-21 DIAGNOSIS — Z79891 Long term (current) use of opiate analgesic: Secondary | ICD-10-CM | POA: Diagnosis not present

## 2017-11-21 MED FILL — diazePAM 5 MG TABS: 5 | 16 days supply | Qty: 50 | Fill #0

## 2017-11-21 MED FILL — HYDROXYZINE PAM 25 MG CAP: 25 | 30 days supply | Qty: 90 | Fill #0

## 2017-11-21 MED FILL — NUCYNTA 50 MG TABS: 50 | 30 days supply | Qty: 90 | Fill #0

## 2017-11-21 NOTE — Progress Notes (Signed)
NEUROLOGY CONSULTATION NOTE  Carly Delacruz MRN: [id number] DOB: 1965-05-19  Referring provider: Wilhelmenia Blase, MD Primary care provider: Alphonse Guild, NP  Reason for consult:  ataxia  HISTORY OF PRESENT ILLNESS: Carly Delacruz is a 52 year old female with chronic back pain, hypertension, depression, anxiety who presents for ataxia.  History supplemented by referring provider's note.  She started falling around December 2017.  She has had several falls since then.  Last fall was a week ago.  Twice, she fell out of the bed.  The first time, it occurred while sleeping.  The next time, she was reaching for something on her night stand and rolled out of bed.  When she stands up after sitting for a prolonged period of time, her legs feel numb and she cannot move her legs until she gets feeling back.  It takes about 4 minutes.  She may fall while walking, usually her legs just give-out.  This is associated with pain and numbness in the legs.  There is no associated dizziness, lightheadedness, vertigo or passing out.  Since onset of falls, she has had multiple changes in her medications, such as psychotropic and pain medications.    Medications include:  Amitriptyline 10-20mg  at bedtime, diazepam 5mg  three times daily PRN, gabapentin 300mg  twice daily, fluoxetine 40mg  daily, risperidone 2mg  at bedtime, tizanidine 2mg  three times daily PRN.  MRI lumbar spine report (imaging not available) from 05/16/17 demonstrated increased lordosis with mild degenerative disc disease causing "central, lateral, recess and foraminal stenosis at L3-L4 and L4-L5, worse at L4-L5" and also "foraminal narrowing at L5-S1 and minimal/mild lateral recess narrowing at L2-L3".  MRI of brain without contrast from 08/12/16 was personally reviewed and was normal.  10/26/17:  BMP with Na 136, K 3.7, CL 99, CO2 29, glucose 109, BUN 16, Cr 0.88; CBC with WBC 8.6, HGB 12.2, HCT 38.1, PLT 287 10/03/17:  Hepatic panel with t bili  0.2, ALP 100, AST 16, ALT 18.  PAST MEDICAL HISTORY: Past Medical History:  Diagnosis Date  . Allergy   . Anemia   . Anxiety   . Arthritis 12/10/03  . Atrophic vaginitis 2009  . BV (bacterial vaginosis) 2006  . Chronic back pain   . Chronic LLQ pain   . Complication of anesthesia   . Elevated LFTs   . Endometriosis   . Fatigue 2012  . Gallstones   . GERD (gastroesophageal reflux disease)   . H/O bladder infections   . H/O mumps   . H/O varicella   . History of candidal vulvovaginitis 03/21/03  . HTN (hypertension)   . Insomnia   . Insomnia secondary to situational depression 06/06/07  . Liver mass, right lobe 08/13/04   Nodule  . Malaise 2012  . Morbid obesity (HCC) 2005  . Osteoporosis   . S/P gastric bypass   . Sleep apnea 2005  . SUI (stress urinary incontinence, female) 08/12/03  . Trichomonas   . Vulvar lesion 03/21/03  . Yeast infection     PAST SURGICAL HISTORY: Past Surgical History:  Procedure Laterality Date  . ABDOMINAL HYSTERECTOMY  2001  . APPENDECTOMY  2001  . CHOLECYSTECTOMY  2011  . DILATION AND CURETTAGE OF UTERUS    . GASTRIC BYPASS  2007   [facility], Dr. Daphine Deutscher  . KNEE ARTHROSCOPY Left   . LAPAROTOMY    . TONSILLECTOMY    . WISDOM TOOTH EXTRACTION      MEDICATIONS: Current Outpatient Medications on File Prior  to Visit  Medication Sig Dispense Refill  . amitriptyline (ELAVIL) 150 MG tablet Take 1 tablet (150 mg total) by mouth at bedtime. 90 tablet 0  . amLODipine (NORVASC) 10 MG tablet Take 10 mg by mouth daily.     . diazepam (VALIUM) 5 MG tablet Place 5 mg vaginally every 8 (eight) hours as needed (for pelvic floor spasms).     . dicyclomine (BENTYL) 20 MG tablet Take 1 tablet (20 mg total) by mouth 2 (two) times daily before a meal. For loose stool an crampy abdominal pain 60 tablet 3  . Estradiol 10 MCG TABS vaginal tablet   12  . famotidine (PEPCID) 20 MG tablet Take 1 tablet (20 mg total) by mouth 2 (two) times daily. 60  tablet 3  . FeFum-FePoly-FA-B Cmp-C-Biot (FOLIVANE-PLUS) CAPS Take by mouth.    Marland Kitchen FLUoxetine (PROZAC) 40 MG capsule Take 1 capsule (40 mg total) by mouth daily. 90 capsule 0  . furosemide (LASIX) 20 MG tablet Take 1 tablet (20 mg total) by mouth 2 (two) times daily. Prn for swelling 60 tablet 0  . gabapentin (NEURONTIN) 300 MG capsule Take 1 capsule (300 mg total) by mouth 2 (two) times daily.    Marland Kitchen OVER THE COUNTER MEDICATION Ultra joint complex vitamin    . potassium chloride (K-DUR) 10 MEQ tablet Take 1 tablet (10 mEq total) by mouth 2 (two) times daily. Use as directed with Lasix 60 tablet 0  . tapentadol (NUCYNTA) 50 MG tablet Take 1 tablet (50 mg total) by mouth every 12 (twelve) hours as needed for moderate pain. (Patient taking differently: Take 50 mg by mouth 3 (three) times daily. )    . tiZANidine (ZANAFLEX) 4 MG tablet Take 4 mg by mouth 3 (three) times daily as needed for muscle spasms.     . Zinc 50 MG CAPS Take 50 mg by mouth.     No current facility-administered medications on file prior to visit.     ALLERGIES: Allergies  Allergen Reactions  . Morphine And Related Shortness Of Breath and Itching  . Septra [Sulfamethoxazole-Trimethoprim] Anaphylaxis and Hives  . Sulfa Antibiotics Anaphylaxis, Hives and Swelling    Throat swelling  . Sulfamethoxazole-Trimethoprim Anaphylaxis and Hives  . Sulfasalazine Anaphylaxis, Hives, Swelling and Other (See Comments)    Throat swelling  . Ultram [Tramadol] Shortness Of Breath, Anxiety and Other (See Comments)    "panic attacks"   . Ciprofloxacin Other (See Comments)    This medication was given along with a sulfa. Reaction is unknown at this time. Must list due to possible allergy.  Leodis [place] [Propoxyphene N-Acetaminophen] Itching  . Adhesive [Tape] Rash  . Codeine Itching and Other (See Comments)    No change when benadryl is given  . Demerol [Meperidine] Nausea Only  . Hydromorphone Itching and Other (See Comments)    No change  when benadryl is given  . Losartan Palpitations    FAMILY HISTORY: Family History  Problem Relation Age of Onset  . Diabetes Paternal Grandmother   . Leukemia Maternal Grandmother   . Arthritis Mother   . Prostate cancer Father   . Colitis Sister    SOCIAL HISTORY: Social History   Socioeconomic History  . Marital status: Single    Spouse name: Not on file  . Number of children: 0  . Years of education: Not on file  . Highest education level: Not on file  Occupational History  . Occupation: Therapist, nutritional: [employer]  Social Needs  . Financial resource strain: Not on file  . Food insecurity:    Worry: Not on file    Inability: Not on file  . Transportation needs:    Medical: Not on file    Non-medical: Not on file  Tobacco Use  . Smoking status: Never Smoker  . Smokeless tobacco: Never Used  Substance and Sexual Activity  . Alcohol use: No  . Drug use: No  . Sexual activity: Never    Birth control/protection: Surgical    Comment: hysterectomy   Lifestyle  . Physical activity:    Days per week: Not on file    Minutes per session: Not on file  . Stress: Not on file  Relationships  . Social connections:    Talks on phone: Not on file    Gets together: Not on file    Attends religious service: Not on file    Active member of club or organization: Not on file    Attends meetings of clubs or organizations: Not on file    Relationship status: Not on file  . Intimate partner violence:    Fear of current or ex partner: Not on file    Emotionally abused: Not on file    Physically abused: Not on file    Forced sexual activity: Not on file  Other Topics Concern  . Not on file  Social History Narrative  . Not on file    REVIEW OF SYSTEMS: Constitutional: No fevers, chills, or sweats, no generalized fatigue, change in appetite Eyes: No visual changes, double vision, eye pain Ear, nose and throat: No hearing loss, ear pain, nasal  congestion, sore throat Cardiovascular: No chest pain, palpitations Respiratory:  No shortness of breath at rest or with exertion, wheezes GastrointestinaI: No nausea, vomiting, diarrhea, abdominal pain, fecal incontinence Genitourinary:  No dysuria, urinary retention or frequency Musculoskeletal:  Back pain Integumentary: No rash, pruritus, skin lesions Neurological: as above Psychiatric: No depression, insomnia, anxiety Endocrine: No palpitations, fatigue, diaphoresis, mood swings, change in appetite, change in weight, increased thirst Hematologic/Lymphatic:  No purpura, petechiae. Allergic/Immunologic: no itchy/runny eyes, nasal congestion, recent allergic reactions, rashes  PHYSICAL EXAM: Blood pressure 120/82, pulse 99, height 5\' 3"  (1.6 m), weight 250 lb (113.4 kg), SpO2 96 %. General: No acute distress.  Patient appears well-groomed.  Head:  Normocephalic/atraumatic Eyes:  fundi examined but not visualized Neck: supple, no paraspinal tenderness, full range of motion Back: No paraspinal tenderness Heart: regular rate and rhythm Lungs: Clear to auscultation bilaterally. Vascular: No carotid bruits. Neurological Exam: Mental status: alert and oriented to person, place, and time, recent and remote memory intact, fund of knowledge intact, attention and concentration intact, speech fluent and not dysarthric, language intact. Cranial nerves: CN I: not tested CN II: pupils equal, round and reactive to light, visual fields intact CN III, IV, VI:  full range of motion, no nystagmus, no ptosis CN V: facial sensation intact CN VII: upper and lower face symmetric CN VIII: hearing intact CN IX, X: gag intact, uvula midline CN XI: sternocleidomastoid and trapezius muscles intact CN XII: tongue midline Bulk & Tone: normal, no fasciculations. Motor:  5/5 throughout  Sensation:  Pinprick sensation and vibration sensation intact. Deep Tendon Reflexes:  Absent throughout, toes downgoing.    Finger to nose testing:  Without dysmetria.  Heel to shin:  Without dysmetria.  Gait:  Cautious wide-based antalgic gait.  Needs to hold on to turn.  Cannot tandem walk. Romberg with mild sway.  IMPRESSION: Unsteady gait/frequent falls.  She does not describe ataxia, dizziness, or vertigo.  Multifactorial, related to polypharmacy, neuropathy and chronic back pain.  She should use walker or cane at all times.  Thank you for allowing me to take part in the care of this patient.  Shon MilletAdam Jaffe, DO  CC:  Wilhelmenia BlaseMary Bodea, MD  Alphonse GuildAshleigh Shambley, NP

## 2017-11-22 ENCOUNTER — Encounter: Payer: Self-pay | Admitting: Neurology

## 2017-11-22 ENCOUNTER — Ambulatory Visit: Payer: BLUE CROSS/BLUE SHIELD | Admitting: Neurology

## 2017-11-22 VITALS — BP 120/82 | HR 99 | Ht 63.0 in | Wt 250.0 lb

## 2017-11-22 DIAGNOSIS — G8929 Other chronic pain: Secondary | ICD-10-CM

## 2017-11-22 DIAGNOSIS — Z79899 Other long term (current) drug therapy: Secondary | ICD-10-CM | POA: Diagnosis not present

## 2017-11-22 DIAGNOSIS — R296 Repeated falls: Secondary | ICD-10-CM | POA: Diagnosis not present

## 2017-11-22 DIAGNOSIS — M545 Low back pain: Secondary | ICD-10-CM | POA: Diagnosis not present

## 2017-11-22 MED FILL — AMLODIPINE BESYLATE 10 MG T: 10 | 30 days supply | Qty: 30 | Fill #2

## 2017-11-22 NOTE — Patient Instructions (Signed)
I think your falls are related to your chronic back pain and medications.  You are on several medications that may contribute to falls.  As you have been having falls for almost 2 years and have had multiple changes in medication, it is difficult for me to determine which of the medications may be contributing.

## 2017-11-23 ENCOUNTER — Other Ambulatory Visit: Payer: Self-pay | Admitting: Family

## 2017-11-23 DIAGNOSIS — M79672 Pain in left foot: Secondary | ICD-10-CM | POA: Diagnosis not present

## 2017-11-23 DIAGNOSIS — M25572 Pain in left ankle and joints of left foot: Secondary | ICD-10-CM | POA: Diagnosis not present

## 2017-11-24 MED FILL — FUROSEMIDE 20 MG TABS: 20 | 30 days supply | Qty: 60 | Fill #0

## 2017-11-25 DIAGNOSIS — F332 Major depressive disorder, recurrent severe without psychotic features: Secondary | ICD-10-CM | POA: Diagnosis not present

## 2017-11-25 MED FILL — FAMOTIDINE 20 MG TABLET: 20 | 30 days supply | Qty: 60 | Fill #1

## 2017-11-28 ENCOUNTER — Telehealth: Payer: Self-pay | Admitting: Nurse Practitioner

## 2017-11-28 DIAGNOSIS — R600 Localized edema: Secondary | ICD-10-CM

## 2017-11-28 NOTE — Telephone Encounter (Signed)
Can we clarify that this is just one leg? I dont think medications would cause one leg to be swollen more than another. With one leg swelling, we may need to have you see a vascular specialists for follow up. What did your orthopedic provider say about your MRI results? I would also like to do a CT scan of your abdomen and pelvis, to make sure there is nothing there causing your leg to swell

## 2017-11-28 NOTE — Telephone Encounter (Signed)
I called pt- she states the left leg/foot is swollen, red and painful. She states  Dr. Althea Charon did not see any acute bone or muscle injury on MRI.  MRI LEFT FOOT IMPRESSION: 1. Prominent soft tissue swelling of the dorsal forefoot and visualized hindfoot is nonspecific but could reflect cellulitis, venous insufficiency, or third spacing. 2. Nonspecific focal marrow edema along the lateral aspect of the tibial epiphysis adjacent to the syndesmosis. This could be stress related. No fracture. 3. Unchanged osseous talonavicular coalition. 4. Unchanged mild edema and small ganglion cyst within the sinus tarsi which can be seen with sinus tarsi syndrome.  She tried compression stockings and it was too tight/painful only on the left leg.  She states he thinks she has vasculitis. Dr. Althea Charon is referring her to Dr. Ollen Bowl?.  She is waiting on a call about the referral.   1.She is requesting something for her leg pain.  2.She wants to know if a CT scan is necessary at this time.  3. Also, She states she was advised gabapentin and amlodipine together can cause swelling.  Please advise.

## 2017-11-28 NOTE — Telephone Encounter (Signed)
Copied from CRM [id number]. Topic: Quick Communication - See Telephone Encounter >> Nov 28, 2017 10:22 AM Floria Raveling A wrote: CRM for notification. See Telephone encounter for: 11/28/17.  Pt called in and stated that she is still having swelling in her left leg and she would like to know if her meds need to be changed because gabapentin and amlodipine is known to cause swelling.  She would like a cma to give her a call back.  She was just in on 9/18  Pharmacy - [facility]  Best number - [phone]

## 2017-11-29 ENCOUNTER — Telehealth: Payer: Self-pay | Admitting: Emergency Medicine

## 2017-11-29 NOTE — Telephone Encounter (Signed)
Copied from CRM [id number]. Topic: Quick Communication - See Telephone Encounter >> Nov 29, 2017 11:22 AM Mare Loan F wrote: Pt called to say the Dr. Norville Haggard can not see her til 01/25/18 regarding her legs and she is wanting to know if something can be done for her legs before then   Best number [phone]

## 2017-11-30 ENCOUNTER — Other Ambulatory Visit: Payer: Self-pay | Admitting: Family

## 2017-12-01 ENCOUNTER — Ambulatory Visit (INDEPENDENT_AMBULATORY_CARE_PROVIDER_SITE_OTHER): Payer: BLUE CROSS/BLUE SHIELD | Admitting: Licensed Clinical Social Worker

## 2017-12-01 ENCOUNTER — Encounter (HOSPITAL_COMMUNITY): Payer: Self-pay | Admitting: Licensed Clinical Social Worker

## 2017-12-01 DIAGNOSIS — F332 Major depressive disorder, recurrent severe without psychotic features: Secondary | ICD-10-CM

## 2017-12-01 NOTE — Progress Notes (Signed)
   THERAPIST PROGRESS NOTE  Session Time: 12:30-1:30pm  Participation Level: Active  Behavioral Response: Casual/Alert/depressed  Type of Therapy: Individual Therapy  Treatment Goals addressed: Improve psychiatric symptoms, Controlled Behavior, Moderated Mood, Improve Unhelpful Thought Patterns, Emotional Regulation Skills (Moderate moods, anger management, stress management), Feel and express a full Range of Emotions, Learn about Diagnosis, Healthy Coping Skills.     Interventions: CBT Summary: Carly Delacruz is a 52 y.o. female   Suicidal/Homicidal: Nowithout intent/plan  Therapist Response: Pt presents for her individual counseling session with her mother. Pt discussed her psychiatric symptoms and current life events. Pt's mother is concerned about her daytime and nightime sleepyiness. She is frustrated with pt because she doesn't take care of herself or the house. She pushes pt with harsh words to get her moving. Discussed with pt's mother her motivations of pt are not working. Discussed with mother about how she is making pt's life more difficult. Discussed the understanding of her concerns about her daughter's mental and physical well being. Suggested to both pt and mother that mother go with pt to her pcp appt and her next pain management appt. To discuss mother's concern.  Both were in agreement with the suggestion.     Plan: Return again in 2 weeks for individual therapy and weekly group therapy  Diagnosis: Axis 1: Severe recurrent major depressive disorder     Neilan Rizzo S, LCAS 12/01/17

## 2017-12-01 NOTE — Telephone Encounter (Signed)
She can call and see if there is a cancellation list for Dr Norville Haggard, or reach out to Dr Althea Charon and see If he has any other ideas. I would still recommend CT scan

## 2017-12-02 NOTE — Telephone Encounter (Signed)
Left mess for patient to call back to advise of below. CRM created.

## 2017-12-02 NOTE — Telephone Encounter (Signed)
See 11/29/17 tele encounter. Closing phone note.

## 2017-12-02 NOTE — Telephone Encounter (Signed)
Patient is calling and states that she is on the cancellation list for Dr. Norville Haggard and her appointment has been moved to 12/21/17 and that Dr. Althea Charon said since it was not orthopedic then it was nothing he could do.

## 2017-12-04 ENCOUNTER — Emergency Department (HOSPITAL_BASED_OUTPATIENT_CLINIC_OR_DEPARTMENT_OTHER): Payer: BLUE CROSS/BLUE SHIELD

## 2017-12-04 ENCOUNTER — Encounter (HOSPITAL_BASED_OUTPATIENT_CLINIC_OR_DEPARTMENT_OTHER): Payer: Self-pay | Admitting: Emergency Medicine

## 2017-12-04 ENCOUNTER — Other Ambulatory Visit: Payer: Self-pay

## 2017-12-04 ENCOUNTER — Emergency Department (HOSPITAL_BASED_OUTPATIENT_CLINIC_OR_DEPARTMENT_OTHER)
Admission: EM | Admit: 2017-12-04 | Discharge: 2017-12-04 | Disposition: A | Discharge status: Home / Self Care | Payer: BLUE CROSS/BLUE SHIELD | Attending: Emergency Medicine | Admitting: Emergency Medicine

## 2017-12-04 DIAGNOSIS — J986 Disorders of diaphragm: Secondary | ICD-10-CM | POA: Diagnosis not present

## 2017-12-04 DIAGNOSIS — Z79899 Other long term (current) drug therapy: Secondary | ICD-10-CM | POA: Insufficient documentation

## 2017-12-04 DIAGNOSIS — R079 Chest pain, unspecified: Secondary | ICD-10-CM | POA: Diagnosis not present

## 2017-12-04 DIAGNOSIS — I10 Essential (primary) hypertension: Secondary | ICD-10-CM | POA: Insufficient documentation

## 2017-12-04 DIAGNOSIS — R0602 Shortness of breath: Secondary | ICD-10-CM | POA: Diagnosis not present

## 2017-12-04 DIAGNOSIS — R110 Nausea: Secondary | ICD-10-CM | POA: Insufficient documentation

## 2017-12-04 DIAGNOSIS — R0789 Other chest pain: Secondary | ICD-10-CM | POA: Diagnosis not present

## 2017-12-04 DIAGNOSIS — R7989 Other specified abnormal findings of blood chemistry: Secondary | ICD-10-CM | POA: Diagnosis not present

## 2017-12-04 LAB — CBC
HEMATOCRIT: 39.7 % (ref 36.0–46.0)
HEMOGLOBIN: 12.6 g/dL (ref 12.0–15.0)
MCH: 27.9 pg (ref 26.0–34.0)
MCHC: 31.7 g/dL (ref 30.0–36.0)
MCV: 88 fL (ref 78.0–100.0)
Platelets: 345 10*3/uL (ref 150–400)
RBC: 4.51 MIL/uL (ref 3.87–5.11)
RDW: 14.4 % (ref 11.5–15.5)
WBC: 5.6 10*3/uL (ref 4.0–10.5)

## 2017-12-04 LAB — TROPONIN I
Troponin I: <0.03 ng/mL (ref <0.03)
Troponin I: <0.03 ng/mL (ref <0.03)

## 2017-12-04 LAB — BASIC METABOLIC PANEL
Anion gap: 7 (ref 5–15)
BUN: 16 mg/dL (ref 6–20)
CO2: 32 mmol/L (ref 22–32)
CREATININE: 1.06 mg/dL — AB (ref 0.44–1.00)
Calcium: 8.9 mg/dL (ref 8.9–10.3)
Chloride: 100 mmol/L (ref 98–111)
GFR calc non Af Amer: 58 mL/min — ABNORMAL LOW (ref >60)
Glucose, Bld: 91 mg/dL (ref 70–99)
Potassium: 3.9 mmol/L (ref 3.5–5.1)
SODIUM: 139 mmol/L (ref 135–145)

## 2017-12-04 LAB — D-DIMER, QUANTITATIVE (NOT AT ARMC): D DIMER QUANT: 1.84 ug{FEU}/mL — AB (ref 0.00–0.50)

## 2017-12-04 MED ORDER — KETOROLAC TROMETHAMINE 30 MG/ML IJ SOLN
10.0000 mg | Freq: Once | INTRAMUSCULAR | Status: AC
  Administered 2017-12-04: 9.9 mg via INTRAVENOUS
  Filled 2017-12-04: qty 1

## 2017-12-04 MED ORDER — IOPAMIDOL (ISOVUE-370) INJECTION 76%
100.0000 mL | Freq: Once | INTRAVENOUS | Status: AC | PRN
  Administered 2017-12-04: 72 mL via INTRAVENOUS

## 2017-12-04 NOTE — ED Provider Notes (Signed)
[facility] EMERGENCY DEPARTMENT Provider Note   CSN: [id number] Arrival date & time: 12/04/17  1006     History   Chief Complaint Chief Complaint  Patient presents with  . Chest Pain    HPI Carly Delacruz is a 51 y.o. female.  HPI 52 year old female comes in with chief complaint of chest pain. Patient has multiple medical comorbidities, but she has no history of CAD or any lung disease.  Patient reports that over the past 3 days she has had some chest discomfort.  Her chest pain is constant for the past 2 days and described as sharp with associated nausea and mild shortness of breath.  Patient's pain is worse with any kind of movement, including that of her arm while she is laying down.  She denies any positional component to the pain and denies any worsening of the pain with exertion.  Past Medical History:  Diagnosis Date  . Allergy   . Anemia   . Anxiety   . Arthritis 12/10/03  . Atrophic vaginitis 2009  . BV (bacterial vaginosis) 2006  . Chronic back pain   . Chronic LLQ pain   . Complication of anesthesia   . Elevated LFTs   . Endometriosis   . Fatigue 2012  . Gallstones   . GERD (gastroesophageal reflux disease)   . H/O bladder infections   . H/O mumps   . H/O varicella   . History of candidal vulvovaginitis 03/21/03  . HTN (hypertension)   . Insomnia   . Insomnia secondary to situational depression 06/06/07  . Liver mass, right lobe 08/13/04   Nodule  . Malaise 2012  . Morbid obesity (HCC) 2005  . Osteoporosis   . S/P gastric bypass   . Sleep apnea 2005  . SUI (stress urinary incontinence, female) 08/12/03  . Trichomonas   . Vulvar lesion 03/21/03  . Yeast infection     Patient Active Problem List   Diagnosis Date Noted  . Encounter for general adult medical examination with abnormal findings 08/04/2017  . Depression 05/08/2017  . Glaucoma 05/02/2017  . Chronic back pain 08/12/2016  . Anemia 08/12/2016  . H/O bladder infections  08/12/2016  . S/P gastric bypass 08/12/2016  . Polypharmacy   . Sleep-disordered breathing 07/26/2014  . GERD (gastroesophageal reflux disease) 06/15/2014  . Neuropathy 06/15/2014  . Essential hypertension 06/15/2014  . Obesity 05/08/2013  . Morbid obesity (HCC) 03/02/2003    Past Surgical History:  Procedure Laterality Date  . ABDOMINAL HYSTERECTOMY  2001  . APPENDECTOMY  2001  . CHOLECYSTECTOMY  2011  . DILATION AND CURETTAGE OF UTERUS    . GASTRIC BYPASS  2007   [facility], Dr. Daphine Deutscher  . KNEE ARTHROSCOPY Left   . LAPAROTOMY    . TONSILLECTOMY    . WISDOM TOOTH EXTRACTION       OB History    Gravida  0   Para  0   Term      Preterm      AB      Living        SAB      TAB      Ectopic      Multiple      Live Births               Home Medications    Prior to Admission medications   Medication Sig Start Date End Date Taking? Authorizing Provider  amitriptyline (ELAVIL) 150 MG tablet Take 1  tablet (150 mg total) by mouth at bedtime. 10/03/17 10/03/18  Burnard Leigh, MD  amLODipine (NORVASC) 10 MG tablet Take 10 mg by mouth daily.     [provider]  diazepam (VALIUM) 5 MG tablet Place 5 mg vaginally every 8 (eight) hours as needed (for pelvic floor spasms).     [provider]  dicyclomine (BENTYL) 20 MG tablet Take 1 tablet (20 mg total) by mouth 2 (two) times daily before a meal. For loose stool an crampy abdominal pain 11/02/17   Pyrtle, Carie Caddy, MD  Estradiol 10 MCG TABS vaginal tablet  08/23/17   [provider]  famotidine (PEPCID) 20 MG tablet Take 1 tablet (20 mg total) by mouth 2 (two) times daily. 11/02/17   Pyrtle, Carie Caddy, MD  FeFum-FePoly-FA-B Cmp-C-Biot Henreitta Cea) CAPS Take by mouth.    [provider]  FLUoxetine (PROZAC) 40 MG capsule Take 1 capsule (40 mg total) by mouth daily. 10/03/17   Burnard Leigh, MD  furosemide (LASIX) 20 MG tablet TAKE 1 TABLET BY MOUTH 2 TIMES DAILY AS  NEEDED FOR SWELLING 11/24/17   Evaristo Bury, NP  gabapentin (NEURONTIN) 300 MG capsule Take 1 capsule (300 mg total) by mouth 2 (two) times daily. 08/16/16   Hongalgi, Maximino Greenland, MD  hydrOXYzine (VISTARIL) 25 MG capsule Take 25 mg by mouth as needed. 11/21/17   [provider]  OVER THE COUNTER MEDICATION Ultra joint complex vitamin    [provider]  potassium chloride (K-DUR,KLOR-CON) 10 MEQ tablet TAKE 1 TABLET BY MOUTH 2 TIMES DAILY. USE AS DIRECTED WITH LASIX 11/30/17   Evaristo Bury, NP  tapentadol (NUCYNTA) 50 MG tablet Take 1 tablet (50 mg total) by mouth every 12 (twelve) hours as needed for moderate pain. Patient taking differently: Take 50 mg by mouth 3 (three) times daily.  08/16/16   Hongalgi, Maximino Greenland, MD  tiZANidine (ZANAFLEX) 4 MG tablet Take 4 mg by mouth 3 (three) times daily as needed for muscle spasms.     [provider]  Zinc 50 MG CAPS Take 50 mg by mouth.    [provider]    Family History Family History  Problem Relation Age of Onset  . Diabetes Paternal Grandmother   . Leukemia Maternal Grandmother   . Arthritis Mother   . Prostate cancer Father   . Colitis Sister   . Hypertension Sister   . Deep vein thrombosis Sister   . Kidney disease Brother   . Obstructive Sleep Apnea Brother     Social History Social History   Tobacco Use  . Smoking status: Never Smoker  . Smokeless tobacco: Never Used  Substance Use Topics  . Alcohol use: No  . Drug use: No     Allergies   Morphine and related; Septra [sulfamethoxazole-trimethoprim]; Sulfa antibiotics; Sulfamethoxazole-trimethoprim; Sulfasalazine; Ultram [tramadol]; Ciprofloxacin; Darvocet [propoxyphene n-acetaminophen]; Adhesive [tape]; Codeine; Demerol [meperidine]; Hydromorphone; and Losartan   Review of Systems Review of Systems  Constitutional: Positive for activity change.  Respiratory: Positive for shortness of breath.   Cardiovascular: Positive for  chest pain.  Gastrointestinal: Negative for nausea and vomiting.  Neurological: Negative for dizziness.     Physical Exam Updated Vital Signs BP 135/90   Pulse 73   Temp 98.3 F (36.8 C) (Oral)   Resp 17   Ht 5\' 3"  (1.6 m)   Wt 113.3 kg   SpO2 95%   BMI 44.25 kg/m   Physical Exam  Constitutional: She is oriented  to person, place, and time. She appears well-developed.  HENT:  Head: Normocephalic and atraumatic.  Eyes: EOM are normal.  Neck: Normal range of motion. Neck supple.  Cardiovascular: Normal rate and normal pulses.  Pulmonary/Chest: Effort normal.  Abdominal: Bowel sounds are normal.  Musculoskeletal:       Right lower leg: She exhibits tenderness. She exhibits no edema.       Left lower leg: She exhibits tenderness. She exhibits no edema.  Neurological: She is alert and oriented to person, place, and time.  Skin: Skin is warm and dry.  Nursing note and vitals reviewed.    ED Treatments / Results  Labs (all labs ordered are listed, but only abnormal results are displayed) Labs Reviewed  BASIC METABOLIC PANEL - Abnormal; Notable for the following components:      Result Value   Creatinine, Ser 1.06 (*)    GFR calc non Af Amer 58 (*)    All other components within normal limits  D-DIMER, QUANTITATIVE (NOT AT [facility]) - Abnormal; Notable for the following components:   D-Dimer, Quant 1.84 (*)    All other components within normal limits  CBC  TROPONIN I  TROPONIN I    EKG EKG Interpretation  Date/Time:  Sunday December 04 2017 10:15:15 EDT Ventricular Rate:  98 PR Interval:    QRS Duration: 105 QT Interval:  382 QTC Calculation: 488 R Axis:   49 Text Interpretation:  Sinus rhythm Left atrial enlargement Borderline prolonged QT interval No acute changes Confirmed by Derwood Kaplan ([id number]) on 12/04/2017 10:27:44 AM Also confirmed by Derwood Kaplan [phone]), editor Elita Quick ([id number])  on 12/04/2017 10:42:38 AM   Radiology Dg Chest 2  View  Result Date: 12/04/2017 CLINICAL DATA:  Left side chest pain EXAM: CHEST - 2 VIEW COMPARISON:  10/26/2017 FINDINGS: Mild elevation of the right hemidiaphragm, stable. No confluent opacities or effusions. Heart is normal size. No acute bony abnormality. IMPRESSION: Elevated right hemidiaphragm, stable.  No active disease. Electronically Signed   By: Charlett Nose M.D.   On: 12/04/2017 10:48   Ct Angio Chest Pe W And/or Wo Contrast  Result Date: 12/04/2017 CLINICAL DATA:  Left lower leg swelling. Elevated D-dimer. EXAM: CT ANGIOGRAPHY CHEST WITH CONTRAST TECHNIQUE: Multidetector CT imaging of the chest was performed using the standard protocol during bolus administration of intravenous contrast. Multiplanar CT image reconstructions and MIPs were obtained to evaluate the vascular anatomy. CONTRAST:  72mL ISOVUE-370 IOPAMIDOL (ISOVUE-370) INJECTION 76% COMPARISON:  05/08/2016 FINDINGS: Cardiovascular: Pulmonary arterial opacification is adequate to the segmental level without evidence of emboli. There is no evidence of thoracic aortic aneurysm or dissection. An aberrant left subclavian artery is noted. The heart is normal in size. There is no pericardial effusion. Mediastinum/Nodes: No enlarged axillary, mediastinal, or hilar lymph nodes are identified. The thyroid and esophagus are unremarkable. Lungs/Pleura: No pleural effusion or pneumothorax. Chronic elevation of the right hemidiaphragm with overlying atelectasis in the right middle and lower lobes. Several punctate subpleural nodules in the right upper and right lower lobes are unchanged, some of which may be calcified. No suspicious nodules are identified. Upper Abdomen: Prior cholecystectomy and gastric bypass. Musculoskeletal: No acute osseous abnormality or suspicious osseous lesion. Review of the MIP images confirms the above findings. IMPRESSION: 1. No evidence of pulmonary emboli or other acute abnormality in the chest. 2. Chronic right  hemidiaphragm elevation with right basilar atelectasis. Electronically Signed   By: Sebastian Ache M.D.   On: 12/04/2017 13:23    Procedures  Procedures (including critical care time)  Medications Ordered in ED Medications  ketorolac (TORADOL) 30 MG/ML injection 9.9 mg (9.9 mg Intravenous Given 12/04/17 1255)  iopamidol (ISOVUE-370) 76 % injection 100 mL (72 mLs Intravenous Contrast Given 12/04/17 1236)     Initial Impression / Assessment and Plan / ED Course  I have reviewed the triage vital signs and the nursing notes.  Pertinent labs & imaging results that were available during my care of the patient were reviewed by me and considered in my medical decision making (see chart for details).  Clinical Course as of Dec 04 1516  Wynelle Link Dec 04, 2017  1221 Patient has taken oral NSAIDs in the past.  She has never had severe allergic reaction to NSAIDs.  We will give her IV Toradol.  Patient does not want oral NSAIDs because of her history of ulcer disease.   [AN]  1222 D-dimer is elevated, we will proceed with a CT PE.  Initial troponin is negative.  D-Dimer, Quant(!): 1.84 [AN]  1518 Results from the ER workup discussed with the patient face to face and all questions answered to the best of my ability. She has been advised to follow with her PCP for this nonspecific pain.  She will return to the ER if her symptoms get worse.  CT Angio Chest PE W and/or Wo Contrast [AN]    Clinical Course User Index [AN] Derwood Kaplan, MD    52 year old female comes in with chief complaint of chest pain.  Patient has been having 2 to 3 days of constant left-sided chest discomfort that is described as sharp pain.  Pain intermittently radiates to her back, and she has worsening of her pain with inspiration and with movement. Hear score is 3 (point each for EKG, age and history).  Delta troponin has been ordered.  D-dimer also ordered because of the pleuritic nature of the pain with radiation to the  back.  History is not suggestive of pericarditis, myocarditis, GERD.  Final Clinical Impressions(s) / ED Diagnoses   Final diagnoses:  Nonspecific chest pain    ED Discharge Orders    None       Derwood Kaplan, MD 12/04/17 1520

## 2017-12-04 NOTE — ED Triage Notes (Signed)
L side chest pain x 2 days, sharp in nature with nausea and mild SOB. She states the pain started when she was bathing and is worse with movement.

## 2017-12-04 NOTE — ED Notes (Signed)
ED Provider at bedside. 

## 2017-12-04 NOTE — Discharge Instructions (Signed)
We saw you in the ER for the chest pain/shortness of breath. All of our cardiac workup is normal, including labs, EKG and chest X-RAY are normal. We are not sure what is causing your discomfort, but we feel comfortable sending you home at this time. The workup in the ER is not complete, and you should follow up with your primary care doctor for further evaluation.  Please see your primary care doctor for further evaluation.

## 2017-12-05 ENCOUNTER — Ambulatory Visit: Payer: Self-pay | Admitting: *Deleted

## 2017-12-05 NOTE — Telephone Encounter (Signed)
Patient began experiencing left-sided behind the breast CP yesterday and was seen at the [facility]. She was given toradol which relieved the pain for a short time. Numerous test were ran, I can see the D-Dimer was elevated at 1.84. She was discharged and told to f/u with her PCP. She has a Cardiology consult on 12/21/17 with Dr. Allyson Sabal stated that was the earliest appointment-it was made for the edema in her feet which is under control at this time due to taking Lasix. The CP started again this morning at 1:00am and has been constant since. Described the pain as a squeezing at her breast that shoots through to the shoulder blade, nowhere else. It does not change with movement .  She has taken Nucynda, tylenol and pepcid without much relief. Advised the ER again for the CP and elevated D-Dimer. Also reports a cough she has had since September. Denies any other symptoms.She refused going to the ED at this time stating if the pain does not subside by 11:00am, she will go at that time. She will call 911 if CP gets worse stated patient. Routing note to PCP. Reason for Disposition . [1] Chest pain lasts > 5 minutes AND [2] described as crushing, pressure-like, or heavy  Answer Assessment - Initial Assessment Questions 1. LOCATION: "Where does it hurt?"       Left sided behind breast 2. RADIATION: "Does the pain go anywhere else?" (e.g., into neck, jaw, arms, back)     Shooting through to the back near shoulder blade. 3. ONSET: "When did the chest pain begin?" (Minutes, hours or days)      1:00a 4. PATTERN "Does the pain come and go, or has it been constant since it started?"  "Does it get worse with exertion?"     Constant since 1am this morning. 5. DURATION: "How long does it last" (e.g., seconds, minutes, hours)     Continues to hurt 6. SEVERITY: "How bad is the pain?"  (e.g., Scale 1-10; mild, moderate, or severe)    - MILD (1-3): doesn't interfere with normal activities     - MODERATE (4-7):  interferes with normal activities or awakens from sleep    - SEVERE (8-10): excruciating pain, unable to do any normal activities       moderate 7. CARDIAC RISK FACTORS: "Do you have any history of heart problems or risk factors for heart disease?" (e.g., prior heart attack, angina; high blood pressure, diabetes, being overweight, high cholesterol, smoking, or strong family history of heart disease)     htnn overweight, last hdl was high 8. PULMONARY RISK FACTORS: "Do you have any history of lung disease?"  (e.g., blood clots in lung, asthma, emphysema, birth control pills)     no 9. CAUSE: "What do you think is causing the chest pain?"     none 10. OTHER SYMPTOMS: "Do you have any other symptoms?" (e.g., dizziness, nausea, vomiting, sweating, fever, difficulty breathing, cough)       Slight cough since her feet have been swelling, about September 11. PREGNANCY: "Is there any chance you are pregnant?" "When was your last menstrual period?"       no  Protocols used: CHEST PAIN-A-AH

## 2017-12-06 ENCOUNTER — Ambulatory Visit (HOSPITAL_COMMUNITY): Payer: Self-pay | Admitting: Licensed Clinical Social Worker

## 2017-12-12 ENCOUNTER — Telehealth: Payer: Self-pay | Admitting: *Deleted

## 2017-12-12 MED FILL — AMITRIPTYLINE HCL 150 MG TA: 150 | 90 days supply | Qty: 90 | Fill #0

## 2017-12-12 NOTE — Telephone Encounter (Signed)
Pt informed of below. She states she now feels nauseated after eating some potatoes. She still refuses to go to ER or UC. She states she will wait until OV on 12/14/17 due to cost of every ER visit and she feels this is not cardiac related. I advised her again not to wait 2 more days until OV with PCP if she is having CP. Patient verbalized understanding and thanked me for the call back.

## 2017-12-12 NOTE — Telephone Encounter (Signed)
Patient informed per Apolonio Schneiders she does not to come to lab prior to her 12/14/17 OV.  She is still c/o chest pain/pressure. I advised her to go back to ER and she states she just needs some carafate. Please advise.    Copied from CRM [id number]. Topic: General - Other >> Dec 12, 2017  2:59 PM Gerrianne Scale wrote: Reason for CRM: pt has an appt on Wednesday and want to know if she need to come into office earlier before appt to get D Dimer drawn so results will be there when she come for appt

## 2017-12-12 NOTE — Telephone Encounter (Signed)
carafate is not used to treat chest pain. If she is having active chest pain and does not wan tot go to Er, she could at least go to an urgent care for screening.

## 2017-12-13 ENCOUNTER — Ambulatory Visit (HOSPITAL_COMMUNITY): Payer: Self-pay | Admitting: Licensed Clinical Social Worker

## 2017-12-14 ENCOUNTER — Encounter

## 2017-12-14 ENCOUNTER — Ambulatory Visit: Payer: BLUE CROSS/BLUE SHIELD | Admitting: Nurse Practitioner

## 2017-12-14 ENCOUNTER — Encounter: Payer: Self-pay | Admitting: Nurse Practitioner

## 2017-12-14 VITALS — BP 122/100 | HR 102 | Ht 63.0 in | Wt 240.0 lb

## 2017-12-14 DIAGNOSIS — K219 Gastro-esophageal reflux disease without esophagitis: Secondary | ICD-10-CM

## 2017-12-14 DIAGNOSIS — I10 Essential (primary) hypertension: Secondary | ICD-10-CM

## 2017-12-14 DIAGNOSIS — R079 Chest pain, unspecified: Secondary | ICD-10-CM | POA: Diagnosis not present

## 2017-12-14 MED ORDER — POTASSIUM CHLORIDE CRYS ER 10 MEQ PO TBCR: 10.0000 meq | EXTENDED_RELEASE_TABLET | Freq: Every day | ORAL | 0 refills | Status: DC

## 2017-12-14 MED ORDER — FUROSEMIDE 20 MG PO TABS: 20.0000 mg | ORAL_TABLET | Freq: Every day | ORAL | 1 refills | Status: DC

## 2017-12-14 MED ORDER — FAMOTIDINE 20 MG PO TABS: 20.0000 mg | ORAL_TABLET | Freq: Two times a day (BID) | ORAL | 3 refills | Status: DC

## 2017-12-14 NOTE — Assessment & Plan Note (Addendum)
BP elevated today, Due to improvement in leg swelling off amlodipine and with lasix, will start daily dose of lasix, continue potassium 10 once daily Keep upcoming follow up with cardiology as planned - furosemide (LASIX) 20 MG tablet; Take 1 tablet (20 mg total) by mouth daily.  Dispense: 30 tablet; Refill: 1

## 2017-12-14 NOTE — Assessment & Plan Note (Signed)
Resume pepcid Home management, red flags and return precautions including when to seek immediate care discussed and printed on AVS F/U if symptoms persist after resuming pepcid - famotidine (PEPCID) 20 MG tablet; Take 1 tablet (20 mg total) by mouth 2 (two) times daily.  Dispense: 60 tablet; Refill: 3

## 2017-12-14 NOTE — Progress Notes (Signed)
Carly Delacruz is a 52 y.o. female with the following history as recorded in Epic[employer]:  Patient Active Problem List   Diagnosis Date Noted  . Encounter for general adult medical examination with abnormal findings 08/04/2017  . Depression 05/08/2017  . Glaucoma 05/02/2017  . Chronic back pain 08/12/2016  . Anemia 08/12/2016  . H/O bladder infections 08/12/2016  . S/P gastric bypass 08/12/2016  . Polypharmacy   . Sleep-disordered breathing 07/26/2014  . GERD (gastroesophageal reflux disease) 06/15/2014  . Neuropathy 06/15/2014  . Essential hypertension 06/15/2014  . Obesity 05/08/2013  . Morbid obesity (HCC) 03/02/2003    Current Outpatient Medications  Medication Sig Dispense Refill  . amitriptyline (ELAVIL) 150 MG tablet Take 1 tablet (150 mg total) by mouth at bedtime. 90 tablet 0  . diazepam (VALIUM) 5 MG tablet Place 5 mg vaginally every 8 (eight) hours as needed (for pelvic floor spasms).     . dicyclomine (BENTYL) 20 MG tablet Take 1 tablet (20 mg total) by mouth 2 (two) times daily before a meal. For loose stool an crampy abdominal pain 60 tablet 3  . Estradiol 10 MCG TABS vaginal tablet   12  . famotidine (PEPCID) 20 MG tablet Take 1 tablet (20 mg total) by mouth 2 (two) times daily. 60 tablet 3  . FeFum-FePoly-FA-B Cmp-C-Biot (FOLIVANE-PLUS) CAPS Take by mouth.    Marland Kitchen FLUoxetine (PROZAC) 40 MG capsule Take 1 capsule (40 mg total) by mouth daily. 90 capsule 0  . furosemide (LASIX) 20 MG tablet Take 1 tablet (20 mg total) by mouth daily. 30 tablet 1  . gabapentin (NEURONTIN) 300 MG capsule Take 1 capsule (300 mg total) by mouth 2 (two) times daily.    . hydrOXYzine (VISTARIL) 25 MG capsule Take 25 mg by mouth as needed.  2  . OVER THE COUNTER MEDICATION Ultra joint complex vitamin    . potassium chloride (K-DUR,KLOR-CON) 10 MEQ tablet TAKE 1 TABLET BY MOUTH 2 TIMES DAILY. USE AS DIRECTED WITH LASIX 60 tablet 1  . tapentadol (NUCYNTA) 50 MG tablet Take 1 tablet (50 mg total)  by mouth every 12 (twelve) hours as needed for moderate pain. (Patient taking differently: Take 50 mg by mouth 3 (three) times daily. )    . tiZANidine (ZANAFLEX) 4 MG tablet Take 4 mg by mouth 3 (three) times daily as needed for muscle spasms.     . Zinc 50 MG CAPS Take 50 mg by mouth.     No current facility-administered medications for this visit.     Allergies: Morphine and related; Septra [sulfamethoxazole-trimethoprim]; Sulfa antibiotics; Sulfamethoxazole-trimethoprim; Sulfasalazine; Ultram [tramadol]; Ciprofloxacin; Darvocet [propoxyphene n-acetaminophen]; Adhesive [tape]; Codeine; Demerol [meperidine]; Hydromorphone; and Losartan  Past Medical History:  Diagnosis Date  . Allergy   . Anemia   . Anxiety   . Arthritis 12/10/03  . Atrophic vaginitis 2009  . BV (bacterial vaginosis) 2006  . Chronic back pain   . Chronic LLQ pain   . Complication of anesthesia   . Elevated LFTs   . Endometriosis   . Fatigue 2012  . Gallstones   . GERD (gastroesophageal reflux disease)   . H/O bladder infections   . H/O mumps   . H/O varicella   . History of candidal vulvovaginitis 03/21/03  . HTN (hypertension)   . Insomnia   . Insomnia secondary to situational depression 06/06/07  . Liver mass, right lobe 08/13/04   Nodule  . Malaise 2012  . Morbid obesity (HCC) 2005  . Osteoporosis   .  S/P gastric bypass   . Sleep apnea 2005  . SUI (stress urinary incontinence, female) 08/12/03  . Trichomonas   . Vulvar lesion 03/21/03  . Yeast infection     Past Surgical History:  Procedure Laterality Date  . ABDOMINAL HYSTERECTOMY  2001  . APPENDECTOMY  2001  . CHOLECYSTECTOMY  2011  . DILATION AND CURETTAGE OF UTERUS    . GASTRIC BYPASS  2007   [facility], Dr. Daphine Deutscher  . KNEE ARTHROSCOPY Left   . LAPAROTOMY    . TONSILLECTOMY    . WISDOM TOOTH EXTRACTION      Family History  Problem Relation Age of Onset  . Diabetes Paternal Grandmother   . Leukemia Maternal Grandmother   .  Arthritis Mother   . Prostate cancer Father   . Colitis Sister   . Hypertension Sister   . Deep vein thrombosis Sister   . Kidney disease Brother   . Obstructive Sleep Apnea Brother     Social History   Tobacco Use  . Smoking status: Never Smoker  . Smokeless tobacco: Never Used  Substance Use Topics  . Alcohol use: No     Subjective:   Carly Delacruz is here today for hospital follow up, was seen at [facility] ED on 12/04/17 for 2-3 day history of chest pain, mild nausea and SOB, worse with inspiration and movement. D-dimer was elevated and a CT scan angio chest was done which showed no PE or other acute abnormality. The remainder of her ED workup was negative for acute process, she was discharged home to follow up with PCP. She was also given IV toradol in ED- which did help her pain temporarily. Since shes been back home, she has noticed continued episodes of chest pressure, describes as "Something sitting on my chest, radiating to my back." she reports she also continues to have some intermittent nausea.. She has tried tylenol, saltines, soda which does not seem to help her symptoms. She reports occasional dry cough, she also reports nausea and heartburn symptoms after eating. She was taking famotidine BID in the past by her gastroenterologist but ran out of the medication and did not have any refills, but did not notice symptoms of gerd on famotidine. Her blood pressure is also elevated today, states she stopped her amlodipine some time ago, was confused if she was supposed to continue it, thought that someone told her this could make her legs swell. She was actually on prn lasix for leg swelling too but has also run out of this. Her leg swelling has actually seemed to improve since stopping amlodipine and wearing compression tights She denies fevers, confusion, syncope, vomiting, bowel or bladder changes. She has a Cardiology appointment upcoming on 12/21/17 and a vascular appointment schedule on  10/23 as well  ROS- See HPI  Objective:  Vitals:   12/14/17 1304 12/14/17 1338  BP: (!) 124/100 (!) 122/100  Pulse: (!) 102   SpO2: 97%   Weight: 240 lb (108.9 kg)   Height: 5\' 3"  (1.6 m)     General: Well developed, well nourished, in no acute distress  Skin : Warm and dry.  Head: Normocephalic and atraumatic  Eyes: Sclera and conjunctiva clear; pupils round and reactive to light; extraocular movements intact  Oropharynx: Pink, supple. No suspicious lesions  Neck: Supple Lungs: Respirations unlabored; no distress CVS exam: normal rate and regular rhythm, S1 and S2 normal.  Extremities: No cyanosis. Scant edema to BLE Vessels: Symmetric bilaterally  Neurologic: Alert and  oriented; speech intact; face symmetrical; moves all extremities well; CNII-XII intact without focal deficit   Assessment:  1. Chest pain, unspecified type   2. Gastroesophageal reflux disease, esophagitis presence not specified   3. Essential hypertension     Plan:   No follow-ups on file.  No orders of the defined types were placed in this encounter.   Requested Prescriptions   Signed Prescriptions Disp Refills  . famotidine (PEPCID) 20 MG tablet 60 tablet 3    Sig: Take 1 tablet (20 mg total) by mouth 2 (two) times daily.  . furosemide (LASIX) 20 MG tablet 30 tablet 1    Sig: Take 1 tablet (20 mg total) by mouth daily.    Chest pain, unspecified type Keep upcoming follow up with cardiology as planned Or F/U sooner for new, worsening symptoms

## 2017-12-14 NOTE — Patient Instructions (Addendum)
Please resume pepcid 20 twice daily for your heartburn symptoms  Please start lasix 20mg  daily for your blood pressure  Please keep upcoming cardiology appointment.   Food Choices for Gastroesophageal Reflux Disease, Adult When you have gastroesophageal reflux disease (GERD), the foods you eat and your eating habits are very important. Choosing the right foods can help ease your discomfort. What guidelines do I need to follow?  Choose fruits, vegetables, whole grains, and low-fat dairy products.  Choose low-fat meat, fish, and poultry.  Limit fats such as oils, salad dressings, butter, nuts, and avocado.  Keep a food diary. This helps you identify foods that cause symptoms.  Avoid foods that cause symptoms. These may be different for everyone.  Eat small meals often instead of 3 large meals a day.  Eat your meals slowly, in a place where you are relaxed.  Limit fried foods.  Cook foods using methods other than frying.  Avoid drinking alcohol.  Avoid drinking large amounts of liquids with your meals.  Avoid bending over or lying down until 2-3 hours after eating. What foods are not recommended? These are some foods and drinks that may make your symptoms worse: Vegetables Tomatoes. Tomato juice. Tomato and spaghetti sauce. Chili peppers. Onion and garlic. Horseradish. Fruits Oranges, grapefruit, and lemon (fruit and juice). Meats High-fat meats, fish, and poultry. This includes hot dogs, ribs, ham, sausage, salami, and bacon. Dairy Whole milk and chocolate milk. Sour cream. Cream. Butter. Ice cream. Cream cheese. Drinks Coffee and tea. Bubbly (carbonated) drinks or energy drinks. Condiments Hot sauce. Barbecue sauce. Sweets/Desserts Chocolate and cocoa. Donuts. Peppermint and spearmint. Fats and Oils High-fat foods. This includes [place] fries and potato chips. Other Vinegar. Strong spices. This includes black pepper, white pepper, red pepper, cayenne, curry powder,  cloves, ginger, and chili powder. The items listed above may not be a complete list of foods and drinks to avoid. Contact your dietitian for more information. This information is not intended to replace advice given to you by your health care provider. Make sure you discuss any questions you have with your health care provider. Document Released: 08/17/2011 Document Revised: 07/24/2015 Document Reviewed: 12/20/2012 Elsevier Interactive Patient Education  2017 [employer].

## 2017-12-15 ENCOUNTER — Encounter (HOSPITAL_COMMUNITY): Payer: Self-pay | Admitting: Licensed Clinical Social Worker

## 2017-12-15 ENCOUNTER — Ambulatory Visit (INDEPENDENT_AMBULATORY_CARE_PROVIDER_SITE_OTHER): Payer: BLUE CROSS/BLUE SHIELD | Admitting: Licensed Clinical Social Worker

## 2017-12-15 DIAGNOSIS — F332 Major depressive disorder, recurrent severe without psychotic features: Secondary | ICD-10-CM

## 2017-12-15 NOTE — Progress Notes (Signed)
   THERAPIST PROGRESS NOTE  Session Time: 10:10-11am  Participation Level: Active  Behavioral Response: Casual/Alert/depressed  Type of Therapy: Individual Therapy  Treatment Goals addressed: Improve psychiatric symptoms, Controlled Behavior, Moderated Mood, Improve Unhelpful Thought Patterns, Emotional Regulation Skills (Moderate moods, anger management, stress management), Feel and express a full Range of Emotions, Learn about Diagnosis, Healthy Coping Skills.     Interventions: CBT Summary: Carly Delacruz is a 52 y.o. female   Suicidal/Homicidal: Nowithout intent/plan  Therapist Response: Pt presents for her individual counseling session with her mother. Pt discussed her psychiatric symptoms and current life events. Pt's mother was more supportive of her daughter today. She has stopped trying to control her daughter and just let her be. Asked open ended questions to pt. She feels her mother has stopped pushing her. She is trying to do more around the house. Pt is feeling frustrated because she feels like her drs are not hearing her. Role played with pt how to ask for what she needs from her docs and asked to be heard. Discussed with pt how her docs what what's best for her. Pt agrees but is frustrated. Discussed with pt her quality of life. She has so much pain she has limited quality. She wants to be like every other 54yo. Processed this with pt.  Plan: Return again in 2 weeks for individual therapy and weekly group therapy  Diagnosis: Axis 1: Severe recurrent major depressive disorder     [name],[name], LCAS 12/15/17

## 2017-12-18 MED FILL — FAMOTIDINE 20 MG TABLET: 20 | 30 days supply | Qty: 60 | Fill #0

## 2017-12-19 MED FILL — FUROSEMIDE 20 MG TABS: 20 | 30 days supply | Qty: 30 | Fill #0

## 2017-12-20 ENCOUNTER — Ambulatory Visit (HOSPITAL_COMMUNITY): Payer: Self-pay | Admitting: Licensed Clinical Social Worker

## 2017-12-21 ENCOUNTER — Ambulatory Visit (INDEPENDENT_AMBULATORY_CARE_PROVIDER_SITE_OTHER): Payer: BLUE CROSS/BLUE SHIELD | Admitting: Cardiovascular Disease

## 2017-12-21 ENCOUNTER — Encounter: Payer: Self-pay | Admitting: Cardiovascular Disease

## 2017-12-21 VITALS — BP 134/98 | HR 102 | Ht 63.0 in | Wt 246.0 lb

## 2017-12-21 DIAGNOSIS — R0789 Other chest pain: Secondary | ICD-10-CM | POA: Diagnosis not present

## 2017-12-21 DIAGNOSIS — I10 Essential (primary) hypertension: Secondary | ICD-10-CM | POA: Diagnosis not present

## 2017-12-21 DIAGNOSIS — R600 Localized edema: Secondary | ICD-10-CM

## 2017-12-21 MED ORDER — METOPROLOL TARTRATE 100 MG PO TABS: 100.0000 mg | ORAL_TABLET | Freq: Once | ORAL | 0 refills | Status: DC

## 2017-12-21 MED ORDER — CARVEDILOL 3.125 MG PO TABS: 3.1250 mg | ORAL_TABLET | Freq: Two times a day (BID) | ORAL | 3 refills | Status: DC

## 2017-12-21 MED FILL — CARVEDILOL 3.125 MG TABLET: 3.125 | 90 days supply | Qty: 180 | Fill #0

## 2017-12-21 MED FILL — METOPROLOL TARTRATE 100 MG: 100 | 1 days supply | Qty: 1 | Fill #0

## 2017-12-21 NOTE — Assessment & Plan Note (Signed)
History of obstructive sleep apnea Pap in the past no longer wearing CPAP because of weight reduction as result of gastric bypass surgery

## 2017-12-21 NOTE — Progress Notes (Signed)
12/21/2017 Carly Delacruz   1965-11-21  [id number]  Primary Physician Carly Bury, NP Primary Cardiologist: Carly Gess MD Carly Delacruz, [place], [place]  HPI:  Carly Delacruz is a 52 y.o. 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  She stopped working in June of last year.  She was referred by Carly Hartshorn, NP, for evaluation of chest pain.  She was seen by Carly Delacruz 04/29/2016 for similar problems.  Work-up was recommended but never pursued.  There is no family history of heart disease.  She is never had a heart attack or stroke.  She does have a history of obstructive sleep apnea but does not wear CPAP currently because of weight loss as a result of gastric bypass surgery in 2001 when she decreased her weight from 315 down to 250.  She does have a history of depression, and GERD.  She has treated hypertension as well.  She was seen in the emergency room on 12/04/2017 for chest pain.  Her d-dimer was mildly elevated which resulted in venous Dopplers which were negative and chest CT which was negative as well for PE or dissection.  Her pain began 2 weeks ago.  It substernal rating to her back and fairly constant.   Current Meds  Medication Sig  . amitriptyline (ELAVIL) 150 MG tablet Take 1 tablet (150 mg total) by mouth at bedtime.  . diazepam (VALIUM) 5 MG tablet Place 5 mg vaginally at bedtime.   . famotidine (PEPCID) 20 MG tablet Take 1 tablet (20 mg total) by mouth 2 (two) times daily.  Marland Kitchen FeFum-FePoly-FA-B Cmp-C-Biot (FOLIVANE-PLUS) CAPS Take 1 capsule by mouth daily.   Marland Kitchen FLUoxetine (PROZAC) 40 MG capsule Take 1 capsule (40 mg total) by mouth daily.  . furosemide (LASIX) 20 MG tablet Take 1 tablet (20 mg total) by mouth daily.  Marland Kitchen gabapentin (NEURONTIN) 300 MG capsule Take 1 capsule (300 mg total) by mouth 2 (two) times daily.  .  hydrOXYzine (VISTARIL) 25 MG capsule Take 25 mg by mouth as needed.  Marland Kitchen OVER THE COUNTER MEDICATION Ultra joint complex vitamin  . potassium chloride (K-DUR,KLOR-CON) 10 MEQ tablet Take 1 tablet (10 mEq total) by mouth daily.  . tapentadol (NUCYNTA) 50 MG tablet Take 1 tablet (50 mg total) by mouth every 12 (twelve) hours as needed for moderate pain. (Patient taking differently: Take 50 mg by mouth 3 (three) times daily. )  . tiZANidine (ZANAFLEX) 4 MG tablet Take 4 mg by mouth 2 (two) times daily.   . Zinc 50 MG CAPS Take 50 mg by mouth.     Allergies  Allergen Reactions  . Morphine And Related Shortness Of Breath and Itching  . Septra [Sulfamethoxazole-Trimethoprim] Anaphylaxis and Hives  . Sulfa Antibiotics Anaphylaxis, Hives and Swelling    Throat swelling  . Sulfamethoxazole-Trimethoprim Anaphylaxis and Hives  . Sulfasalazine Anaphylaxis, Hives, Swelling and Other (See Comments)    Throat swelling  . Ultram [Tramadol] Shortness Of Breath, Anxiety and Other (See Comments)    "panic attacks"   . Ciprofloxacin Other (See Comments)    This medication was given along with a sulfa. Reaction is unknown at this time. Must list due to possible allergy.  Leodis [place] [Propoxyphene N-Acetaminophen] Itching  . Adhesive [Tape] Rash  . Codeine Itching and Other (See Comments)    No change when benadryl is given  .  Demerol [Meperidine] Nausea Only  . Hydromorphone Itching and Other (See Comments)    No change when benadryl is given  . Losartan Palpitations    Social History   Socioeconomic History  . Marital status: Single    Spouse name: Not on file  . Number of children: 0  . Years of education: Not on file  . Highest education level: Bachelor's degree (e.g., BA, AB, BS)  Occupational History  . Occupation: DISABILITY  Social Needs  . Financial resource strain: Not on file  . Food insecurity:    Worry: Not on file    Inability: Not on file  . Transportation needs:    Medical: Not  on file    Non-medical: Not on file  Tobacco Use  . Smoking status: Never Smoker  . Smokeless tobacco: Never Used  Substance and Sexual Activity  . Alcohol use: No  . Drug use: No  . Sexual activity: Never    Birth control/protection: Surgical    Comment: hysterectomy   Lifestyle  . Physical activity:    Days per week: Not on file    Minutes per session: Not on file  . Stress: Not on file  Relationships  . Social connections:    Talks on phone: Not on file    Gets together: Not on file    Attends religious service: Not on file    Active member of club or organization: Not on file    Attends meetings of clubs or organizations: Not on file    Relationship status: Not on file  . Intimate partner violence:    Fear of current or ex partner: Not on file    Emotionally abused: Not on file    Physically abused: Not on file    Forced sexual activity: Not on file  Other Topics Concern  . Not on file  Social History Narrative   Patient is sing[place]. Her mother is living with her in a one level house, 2 steps to enter.      Review of Systems: General: negative for chills, fever, night sweats or weight changes.  Cardiovascular: negative for chest pain, dyspnea on exertion, edema, orthopnea, palpitations, paroxysmal nocturnal dyspnea or shortness of breath Dermatological: negative for rash Respiratory: negative for cough or wheezing Urologic: negative for hematuria Abdominal: negative for nausea, vomiting, diarrhea, bright red blood per rectum, melena, or hematemesis Neurologic: negative for visual changes, syncope, or dizziness All other systems reviewed and are otherwise negative except as noted above.    Blood pressure (!) 134/98, pulse (!) 102, height 5\' 3"  (1.6 m), weight 246 lb (111.6 kg).  General appearance: alert and no distress Neck: no adenopathy, no carotid bruit, no JVD, supple, symmetrical, trachea midline and thyroid not enlarged, symmetric, no  tenderness/mass/nodules Lungs: clear to auscultation bilaterally Heart: regular rate and rhythm, S1, S2 normal, no murmur, click, rub or gallop Abdomen: soft, non-tender; bowel sounds normal; no masses,  no organomegaly Pulses: 2+ and symmetric Skin: Skin color, texture, turgor normal. No rashes or lesions Neurologic: Alert and oriented X 3, normal strength and tone. Normal symmetric reflexes. Normal coordination and gait  EKG sinus tachycardia 102 with nonspecific ST and T wave changes.  Personally reviewed this EKG.  ASSESSMENT AND PLAN:   Obesity History of obesity with a BMI of 43.5.  She did have a gastric bypass operation back in 2001 and went from 315 pounds down to 250 pounds which she hovers around.  Essential hypertension Of essential hypertension with blood  pressure measured at 134/98.  She does check her blood pressure at home which runs in the 140/90 range.  She was on amlodipine in the recently which was discontinued because of question peripheral edema.  She is somewhat tachycardic today as well.  She is aware of salt restriction which she just started to institute.  I am going to begin her on low-dose carvedilol 3.25 mg p.o. twice daily I will have her keep a blood pressure log for 30 days.  She will see Baxter Hire back in the hypertension clinic in follow-up who can titrate this as needed and/or add additional antihypertensive medication such as losartan.  Sleep-disordered breathing History of obstructive sleep apnea Pap in the past no longer wearing CPAP because of weight reduction as result of gastric bypass surgery  Atypical chest pain Ms. Sweeting was referred by Carly Hartshorn NP for evaluation of atypical chest pain.  She is had this for the last 2 weeks and was actually evaluated in the emergency room for this.  A chest CT was negative for PE or dissection.  It did mention an apparent left subclavian artery but there was no mention of coronary calcification.  She actually  was evaluated by Ranae Palms, PA-C back in March 2018 who ordered a stress test and echo which were never performed.  She has minimal cardiac risk factors which include hypertension.  She is not a great candidate for stress testing because of her body habitus.  I am going to get a coronary CTA to further evaluate.  Lower extremity edema History of bilateral lower extremity edema improved with the addition of low-dose furosemide.  I suspect this is multifactorial from dietary indiscretion, potentially diastolic dysfunction and/or venous insufficiency.      Carly Gess MD FACP,FACC,FAHA, [facility] 12/21/2017 9:38 AM

## 2017-12-21 NOTE — Assessment & Plan Note (Signed)
Ms. Carly Delacruz was referred by Carly Hartshorn NP for evaluation of atypical chest pain.  She is had this for the last 2 weeks and was actually evaluated in the emergency room for this.  A chest CT was negative for PE or dissection.  It did mention an apparent left subclavian artery but there was no mention of coronary calcification.  She actually was evaluated by Carly Palms, PA-C back in March 2018 who ordered a stress test and echo which were never performed.  She has minimal cardiac risk factors which include hypertension.  She is not a great candidate for stress testing because of her body habitus.  I am going to get a coronary CTA to further evaluate.

## 2017-12-21 NOTE — Assessment & Plan Note (Signed)
Of essential hypertension with blood pressure measured at 134/98.  She does check her blood pressure at home which runs in the 140/90 range.  She was on amlodipine in the recently which was discontinued because of question peripheral edema.  She is somewhat tachycardic today as well.  She is aware of salt restriction which she just started to institute.  I am going to begin her on low-dose carvedilol 3.25 mg p.o. twice daily I will have her keep a blood pressure log for 30 days.  She will see Baxter Hire back in the hypertension clinic in follow-up who can titrate this as needed and/or add additional antihypertensive medication such as losartan.

## 2017-12-21 NOTE — Assessment & Plan Note (Signed)
History of obesity with a BMI of 43.5.  She did have a gastric bypass operation back in 2001 and went from 315 pounds down to 250 pounds which she hovers around.

## 2017-12-21 NOTE — Assessment & Plan Note (Signed)
History of bilateral lower extremity edema improved with the addition of low-dose furosemide.  I suspect this is multifactorial from dietary indiscretion, potentially diastolic dysfunction and/or venous insufficiency.

## 2017-12-21 NOTE — Patient Instructions (Addendum)
Medication Instructions:  Your physician has recommended you make the following change in your medication:  1) START Coreg 3.125 one tablet by mouth TWICE daily 2) ONCE Coronary CTA is scheduled please take Lopressor 100 mg tablet by mouth 2 hours before test.   If you need a refill on your cardiac medications before your next appointment, please call your pharmacy.   Lab work: none If you have labs (blood work) drawn today and your tests are completely normal, you will receive your results only by: Marland Kitchen MyChart Message (if you have MyChart) OR . A paper copy in the mail If you have any lab test that is abnormal or we need to change your treatment, we will call you to review the results.  Testing/Procedures: Your physician has requested that you have an echocardiogram. Echocardiography is a painless test that uses sound waves to create images of your heart. It provides your doctor with information about the size and shape of your heart and how well your heart's chambers and valves are working. This procedure takes approximately one hour. There are no restrictions for this procedure.  Please schedule Coronary CTA.    Please arrive at the [facility] main entrance of [facility] at ____________AM (30-45 minutes prior to test start time)  [facility] [address] [place], [place] [address] [phone]  Proceed to the [facility] Radiology Department (First Floor).  Please follow these instructions carefully (unless otherwise directed):  Hold all erectile dysfunction medications at least 48 hours prior to test.  On the Night Before the Test: . Be sure to Drink plenty of water. . Do not consume any caffeinated/decaffeinated beverages or chocolate 12 hours prior to your test. . Do not take any antihistamines 12 hours prior to your test. . If you take Metformin do not take 24 hours prior to test.  On the Day of the Test: . Drink plenty of water. Do not drink any  water within one hour of the test. . Do not eat any food 4 hours prior to the test. . You may take your regular medications prior to the test.  . Take metoprolol (Lopressor) two hours prior to test. . HOLD Furosemide/Hydrochlorothiazide morning of the test.       After the Test: . Drink plenty of water. . After receiving IV contrast, you may experience a mild flushed feeling. This is normal. . On occasion, you may experience a mild rash up to 24 hours after the test. This is not dangerous. If this occurs, you can take Benadryl 25 mg and increase your fluid intake. . If you experience trouble breathing, this can be serious. If it is severe call 911 IMMEDIATELY. If it is mild, please call our office. . If you take any of these medications: Glipizide/Metformin, Avandament, Glucavance, please do not take 48 hours after completing test.   Follow-Up: At [facility], you and your health needs are our priority.  As part of our continuing mission to provide you with exceptional heart care, we have created designated Provider Care Teams.  These Care Teams include your primary Cardiologist (physician) and Advanced Practice Providers (APPs -  Physician Assistants and Nurse Practitioners) who all work together to provide you with the care you need, when you need it. You will need a follow up appointment in 3 months.  Please call our office 2 months in advance to schedule this appointment.  You may see Dr. Allyson Sabal or one of the following Advanced Practice  Providers on your designated Care Team:   Corine Shelter, PA-C [place], [place] . Marjie Skiff, PA-C  Your physician recommends that you schedule a follow-up appointment in: 4 weeks in Hypertension Clinic  Any Other Special Instructions Will Be Listed Below (If Applicable).  Dr. Allyson Sabal would like you to check your blood pressure DAILY for the next 4 weeks.  Keep a journal of these daily BP and heart rate reading and call our office with the results.

## 2017-12-26 DIAGNOSIS — G4700 Insomnia, unspecified: Secondary | ICD-10-CM | POA: Diagnosis not present

## 2017-12-26 DIAGNOSIS — M47816 Spondylosis without myelopathy or radiculopathy, lumbar region: Secondary | ICD-10-CM | POA: Diagnosis not present

## 2017-12-26 DIAGNOSIS — G894 Chronic pain syndrome: Secondary | ICD-10-CM | POA: Diagnosis not present

## 2017-12-26 DIAGNOSIS — M461 Sacroiliitis, not elsewhere classified: Secondary | ICD-10-CM | POA: Diagnosis not present

## 2017-12-26 MED FILL — NUCYNTA 50 MG TABS: 50 | 30 days supply | Qty: 90 | Fill #0

## 2017-12-26 MED FILL — POTASSIUM CHL ER M10 TABLET: 10 | 30 days supply | Qty: 60 | Fill #0

## 2017-12-26 MED FILL — GABAPENTIN 300 MG CAPSULE: 300 | 30 days supply | Qty: 60 | Fill #0

## 2017-12-27 ENCOUNTER — Telehealth: Payer: Self-pay | Admitting: Cardiovascular Disease

## 2017-12-27 ENCOUNTER — Ambulatory Visit (HOSPITAL_COMMUNITY): Payer: BLUE CROSS/BLUE SHIELD | Attending: Cardiovascular Disease

## 2017-12-27 ENCOUNTER — Ambulatory Visit (HOSPITAL_COMMUNITY): Payer: Self-pay | Admitting: Licensed Clinical Social Worker

## 2017-12-27 ENCOUNTER — Other Ambulatory Visit: Payer: Self-pay

## 2017-12-27 DIAGNOSIS — I10 Essential (primary) hypertension: Secondary | ICD-10-CM | POA: Diagnosis not present

## 2017-12-27 DIAGNOSIS — R600 Localized edema: Secondary | ICD-10-CM | POA: Diagnosis not present

## 2017-12-27 NOTE — Telephone Encounter (Signed)
Spoke with pt. Adv pt that the Metoprolol 100mg  tablet (one time dose) is to be taken 2 hours prior to her Coronary CT. Pt verbalized understanding and voiced appreciation for the call back.

## 2017-12-27 NOTE — Telephone Encounter (Signed)
New message  Pt c/o medication issue:  1. Name of Medication: metoprolol tartrate (LOPRESSOR) 100 MG tablet(Expired)  2. How are you currently taking this medication (dosage and times per day)? Patient has taken 1 dose   3. Are you having a reaction (difficulty breathing--STAT)? No   4. What is your medication issue?Patient wants to know on medicine instructions it states to take 2 hours before test. Patient needs to know what test is this??

## 2017-12-29 ENCOUNTER — Ambulatory Visit (HOSPITAL_COMMUNITY): Payer: Self-pay | Admitting: Licensed Clinical Social Worker

## 2018-01-03 ENCOUNTER — Ambulatory Visit (HOSPITAL_COMMUNITY): Payer: BLUE CROSS/BLUE SHIELD | Admitting: Licensed Clinical Social Worker

## 2018-01-04 ENCOUNTER — Emergency Department (HOSPITAL_BASED_OUTPATIENT_CLINIC_OR_DEPARTMENT_OTHER)
Admission: EM | Admit: 2018-01-04 | Discharge: 2018-01-04 | Disposition: A | Discharge status: Home / Self Care | Payer: BLUE CROSS/BLUE SHIELD | Attending: Emergency Medicine | Admitting: Emergency Medicine

## 2018-01-04 ENCOUNTER — Other Ambulatory Visit: Payer: Self-pay

## 2018-01-04 ENCOUNTER — Emergency Department (HOSPITAL_BASED_OUTPATIENT_CLINIC_OR_DEPARTMENT_OTHER): Payer: BLUE CROSS/BLUE SHIELD

## 2018-01-04 ENCOUNTER — Encounter (HOSPITAL_BASED_OUTPATIENT_CLINIC_OR_DEPARTMENT_OTHER): Payer: Self-pay | Admitting: Emergency Medicine

## 2018-01-04 DIAGNOSIS — S6991XA Unspecified injury of right wrist, hand and finger(s), initial encounter: Secondary | ICD-10-CM | POA: Diagnosis not present

## 2018-01-04 DIAGNOSIS — Y9389 Activity, other specified: Secondary | ICD-10-CM | POA: Diagnosis not present

## 2018-01-04 DIAGNOSIS — R0789 Other chest pain: Secondary | ICD-10-CM | POA: Diagnosis not present

## 2018-01-04 DIAGNOSIS — R0781 Pleurodynia: Secondary | ICD-10-CM | POA: Diagnosis not present

## 2018-01-04 DIAGNOSIS — W01198A Fall on same level from slipping, tripping and stumbling with subsequent striking against other object, initial encounter: Secondary | ICD-10-CM | POA: Diagnosis not present

## 2018-01-04 DIAGNOSIS — I10 Essential (primary) hypertension: Secondary | ICD-10-CM | POA: Insufficient documentation

## 2018-01-04 DIAGNOSIS — S299XXA Unspecified injury of thorax, initial encounter: Secondary | ICD-10-CM | POA: Diagnosis not present

## 2018-01-04 DIAGNOSIS — S4991XA Unspecified injury of right shoulder and upper arm, initial encounter: Secondary | ICD-10-CM | POA: Diagnosis not present

## 2018-01-04 DIAGNOSIS — M25331 Other instability, right wrist: Secondary | ICD-10-CM

## 2018-01-04 DIAGNOSIS — Y9289 Other specified places as the place of occurrence of the external cause: Secondary | ICD-10-CM | POA: Diagnosis not present

## 2018-01-04 DIAGNOSIS — M25531 Pain in right wrist: Secondary | ICD-10-CM | POA: Diagnosis not present

## 2018-01-04 DIAGNOSIS — M25511 Pain in right shoulder: Secondary | ICD-10-CM | POA: Diagnosis not present

## 2018-01-04 DIAGNOSIS — Z79899 Other long term (current) drug therapy: Secondary | ICD-10-CM | POA: Diagnosis not present

## 2018-01-04 DIAGNOSIS — W19XXXA Unspecified fall, initial encounter: Secondary | ICD-10-CM

## 2018-01-04 DIAGNOSIS — Y998 Other external cause status: Secondary | ICD-10-CM | POA: Insufficient documentation

## 2018-01-04 HISTORY — DX: Post-traumatic stress disorder, chronic: F43.12

## 2018-01-04 HISTORY — DX: Major depressive disorder, recurrent severe without psychotic features: F33.2

## 2018-01-04 NOTE — Discharge Instructions (Addendum)
Wear splint and follow-up with orthopedics.  Referral was given to Dr. Amanda Pea, hand specialist on-call.  You may also call your orthopedic specialist. Apply ice for 20 minutes at a time and elevate above the level of your heart to help with pain and swelling. Take your medications as prescribed.

## 2018-01-04 NOTE — ED Triage Notes (Signed)
Pt fell while trying to use her walker.  The walker moved out from her reach and she fell.  Pt c/o pain right shoulder, right ribs, right leg.  Pt did hit her head but no LOC.

## 2018-01-04 NOTE — ED Provider Notes (Signed)
[facility] EMERGENCY DEPARTMENT Provider Note   CSN: [id number] Arrival date & time: 01/04/18  1709     History   Chief Complaint Chief Complaint  Patient presents with  . Fall    HPI Carly Delacruz is a 52 y.o. female.  52 year old female presents with complaint of injuries from a fall.  Patient states that she ambulates with a walker, she was sitting down in her walker started to roll downhill.  Patient tried to grab her walker and she fell landing on her right side.  Patient reports pain in her right wrist, right ribs, right shoulder patient states that she did hit her forehead, denies loss of consciousness, severe headache, changes in vision, nausea or vomiting.  Patient does not take blood thinners.  No other injuries, complaints, concerns.     Past Medical History:  Diagnosis Date  . Allergy   . Anemia   . Anxiety   . Arthritis 12/10/03  . Atrophic vaginitis 2009  . BV (bacterial vaginosis) 2006  . Chronic back pain   . Chronic LLQ pain   . Complication of anesthesia   . Elevated LFTs   . Endometriosis   . Fatigue 2012  . Gallstones   . GERD (gastroesophageal reflux disease)   . H/O bladder infections   . H/O mumps   . H/O varicella   . History of candidal vulvovaginitis 03/21/03  . HTN (hypertension)   . Insomnia   . Insomnia secondary to situational depression 06/06/07  . Liver mass, right lobe 08/13/04   Nodule  . Malaise 2012  . Morbid obesity (HCC) 2005  . Osteoporosis   . S/P gastric bypass   . Sleep apnea 2005  . SUI (stress urinary incontinence, female) 08/12/03  . Trichomonas   . Vulvar lesion 03/21/03  . Yeast infection     Patient Active Problem List   Diagnosis Date Noted  . Atypical chest pain 12/21/2017  . Lower extremity edema 12/21/2017  . Encounter for general adult medical examination with abnormal findings 08/04/2017  . Depression 05/08/2017  . Glaucoma 05/02/2017  . Chronic back pain 08/12/2016  . Anemia 08/12/2016    . H/O bladder infections 08/12/2016  . S/P gastric bypass 08/12/2016  . Polypharmacy   . Sleep-disordered breathing 07/26/2014  . GERD (gastroesophageal reflux disease) 06/15/2014  . Neuropathy 06/15/2014  . Essential hypertension 06/15/2014  . Obesity 05/08/2013  . Morbid obesity (HCC) 03/02/2003    Past Surgical History:  Procedure Laterality Date  . ABDOMINAL HYSTERECTOMY  2001  . APPENDECTOMY  2001  . CHOLECYSTECTOMY  2011  . DILATION AND CURETTAGE OF UTERUS    . GASTRIC BYPASS  2007   [facility], Dr. Daphine Deutscher  . KNEE ARTHROSCOPY Left   . LAPAROTOMY    . TONSILLECTOMY    . WISDOM TOOTH EXTRACTION       OB History    Gravida  0   Para  0   Term      Preterm      AB      Living        SAB      TAB      Ectopic      Multiple      Live Births               Home Medications    Prior to Admission medications   Medication Sig Start Date End Date Taking? Authorizing Provider  amitriptyline (ELAVIL) 150 MG tablet  Take 1 tablet (150 mg total) by mouth at bedtime. 10/03/17 10/03/18 Yes Eksir, Bo Mcclintock, MD  carvedilol (COREG) 3.125 MG tablet Take 1 tablet (3.125 mg total) by mouth 2 (two) times daily. 12/21/17 03/21/18 Yes Runell Gess, MD  famotidine (PEPCID) 20 MG tablet Take 1 tablet (20 mg total) by mouth 2 (two) times daily. 12/14/17  Yes Evaristo Bury, NP  FeFum-FePoly-FA-B Cmp-C-Biot (FOLIVANE-PLUS) CAPS Take 1 capsule by mouth daily.    Yes [provider]  FLUoxetine (PROZAC) 40 MG capsule Take 1 capsule (40 mg total) by mouth daily. 10/03/17  Yes Eksir, Bo Mcclintock, MD  furosemide (LASIX) 20 MG tablet Take 1 tablet (20 mg total) by mouth daily. 12/14/17  Yes Evaristo Bury, NP  gabapentin (NEURONTIN) 300 MG capsule Take 1 capsule (300 mg total) by mouth 2 (two) times daily. 08/16/16  Yes Hongalgi, Maximino Greenland, MD  hydrOXYzine (VISTARIL) 25 MG capsule Take 25 mg by mouth as needed. 11/21/17  Yes [provider]  potassium chloride (K-DUR,KLOR-CON) 10 MEQ tablet Take 1 tablet (10 mEq total) by mouth daily. 12/14/17  Yes Evaristo Bury, NP  tapentadol (NUCYNTA) 50 MG tablet Take 1 tablet (50 mg total) by mouth every 12 (twelve) hours as needed for moderate pain. Patient taking differently: Take 50 mg by mouth 3 (three) times daily.  08/16/16  Yes Hongalgi, Maximino Greenland, MD  Zinc 50 MG CAPS Take 50 mg by mouth.   Yes [provider]  diazepam (VALIUM) 5 MG tablet Place 5 mg vaginally at bedtime.     [provider]  metoprolol tartrate (LOPRESSOR) 100 MG tablet Take 1 tablet (100 mg total) by mouth once for 1 dose. Take 2 hours before test 12/21/17 12/21/17  Runell Gess, MD  OVER THE COUNTER MEDICATION Ultra joint complex vitamin    [provider]  tiZANidine (ZANAFLEX) 4 MG tablet Take 4 mg by mouth 2 (two) times daily.     [provider]    Family History Family History  Problem Relation Age of Onset  . Diabetes Paternal Grandmother   . Leukemia Maternal Grandmother   . Arthritis Mother   . Prostate cancer Father   . Colitis Sister   . Hypertension Sister   . Deep vein thrombosis Sister   . Kidney disease Brother   . Obstructive Sleep Apnea Brother     Social History Social History   Tobacco Use  . Smoking status: Never Smoker  . Smokeless tobacco: Never Used  Substance Use Topics  . Alcohol use: No  . Drug use: No     Allergies   Morphine and related; Septra [sulfamethoxazole-trimethoprim]; Sulfa antibiotics; Sulfamethoxazole-trimethoprim; Sulfasalazine; Ultram [tramadol]; Ciprofloxacin; Darvocet [propoxyphene n-acetaminophen]; Adhesive [tape]; Codeine; Demerol [meperidine]; Hydromorphone; and Losartan   Review of Systems Review of Systems  Constitutional: Negative for fever.  Gastrointestinal: Negative for nausea and vomiting.  Musculoskeletal: Positive for arthralgias and myalgias. Negative for back pain, joint  swelling and neck pain.  Skin: Negative for rash and wound.  Allergic/Immunologic: Negative for immunocompromised state.  Neurological: Negative for dizziness, weakness, light-headedness and headaches.  Hematological: Does not bruise/bleed easily.  Psychiatric/Behavioral: Negative for confusion.  All other systems reviewed and are negative.    Physical Exam Updated Vital Signs BP (!) 154/89 (BP Location: Right Arm)   Pulse 98   Temp 99.2 F (37.3 C) (Oral)   Resp 18   Ht 5\' 3"  (1.6 m)   Wt 108.9 kg   SpO2  94%   BMI 42.51 kg/m   Physical Exam  Constitutional: She is oriented to person, place, and time. She appears well-developed and well-nourished. No distress.  HENT:  Head: Normocephalic.    Eyes: Pupils are equal, round, and reactive to light. EOM are normal.  Neck: Neck supple.  Cardiovascular: Intact distal pulses.  Pulmonary/Chest: Effort normal. She exhibits tenderness.  TTP right lower/anterior ribs without ecchymosis or crepitus    Abdominal: She exhibits no distension. There is no tenderness.  Musculoskeletal: Normal range of motion. She exhibits tenderness. She exhibits no deformity.       Right shoulder: She exhibits tenderness. She exhibits normal range of motion, no bony tenderness, no crepitus and no deformity.       Right elbow: Normal.      Right wrist: She exhibits tenderness and bony tenderness. She exhibits no swelling, no effusion and no crepitus.       Cervical back: Normal.       Thoracic back: She exhibits no tenderness and no bony tenderness.       Lumbar back: She exhibits no tenderness and no bony tenderness.       Arms:      Right hand: Normal.  Neurological: She is alert and oriented to person, place, and time. No sensory deficit.  Skin: Skin is warm and dry. She is not diaphoretic.  Psychiatric: She has a normal mood and affect. Her behavior is normal.  Nursing note and vitals reviewed.    ED Treatments / Results  Labs (all labs  ordered are listed, but only abnormal results are displayed) Labs Reviewed - No data to display  EKG None  Radiology Dg Ribs Unilateral W/chest Right  Result Date: 01/04/2018 CLINICAL DATA:  Right rib and shoulder pain after a fall. Initial encounter. EXAM: RIGHT RIBS AND CHEST - 3+ VIEW COMPARISON:  Chest radiographs and CTA 12/04/2017 FINDINGS: The cardiomediastinal silhouette is unchanged with normal heart size. There is chronic elevation of the right hemidiaphragm with increased subsegmental atelectasis in the right lung base. The left lung is clear. No sizable pleural effusion or pneumothorax is identified. No rib fracture is identified. IMPRESSION: 1. No rib fracture identified. 2. Chronic elevation of the right hemidiaphragm with right basilar atelectasis. Electronically Signed   By: Sebastian Ache M.D.   On: 01/04/2018 19:52   Dg Shoulder Right  Result Date: 01/04/2018 CLINICAL DATA:  Fall EXAM: RIGHT SHOULDER - 2+ VIEW COMPARISON:  None. FINDINGS: There is no evidence of fracture or dislocation. There is no evidence of arthropathy or other focal bone abnormality. Soft tissues are unremarkable. IMPRESSION: Negative. Electronically Signed   By: Marlan Palau M.D.   On: 01/04/2018 19:54   Dg Wrist Complete Right  Result Date: 01/04/2018 CLINICAL DATA:  Fall EXAM: RIGHT WRIST - COMPLETE 3+ VIEW COMPARISON:  None. FINDINGS: Widening of the scapholunate joint compatible with ligament rupture of indeterminate age. No priors. Negative for fracture. No significant degenerative change IMPRESSION: Scapholunate ligament rupture indeterminate age.  No fracture Electronically Signed   By: Marlan Palau M.D.   On: 01/04/2018 19:55    Procedures Procedures (including critical care time)  Medications Ordered in ED Medications - No data to display   Initial Impression / Assessment and Plan / ED Course  I have reviewed the triage vital signs and the nursing notes.  Pertinent labs & imaging  results that were available during my care of the patient were reviewed by me and considered in my medical decision  making (see chart for details).  Clinical Course as of Jan 04 2021  Wed Jan 04, 2018  [id number] 52 year old female presents for evaluation after fall.  Patient states that her walker was rolling away from her when she tried to catch that she fell onto her right side.  Has tenderness in her right wrist, right shoulder through the trapezius area as well as her right lower anterior ribs.  Chest x-ray and rib series negative for acute rib fracture.  Right shoulder x-ray is unremarkable.  Right wrist x-ray shows possible injury to the scapholunate ligament.  Patient reports prior injury as a child but uncertain what had happened to her wrist at that time.  Patient does have tenderness in her wrist, she was placed in a wrist immobilizer and referred to hand Ortho for follow-up.   [LM]    Clinical Course User Index [LM] Jeannie Fend, PA-C   Final Clinical Impressions(s) / ED Diagnoses   Final diagnoses:  Fall, initial encounter  Scapholunate instability of right wrist  Right-sided chest wall pain  Acute pain of right shoulder    ED Discharge Orders    None       Jeannie Fend, PA-C 01/04/18 2022    Virgina Norfolk, DO 01/04/18 2333

## 2018-01-04 NOTE — ED Notes (Signed)
Pt verbalizes understanding of d/c instructions and denies any further needs at this time. 

## 2018-01-09 MED FILL — diazePAM 5 MG TABS: 5 | 17 days supply | Qty: 50 | Fill #0

## 2018-01-09 MED FILL — tiZANidine HCL 4 MG TABS: 4 | 30 days supply | Qty: 90 | Fill #3

## 2018-01-10 ENCOUNTER — Ambulatory Visit (HOSPITAL_COMMUNITY): Payer: BLUE CROSS/BLUE SHIELD | Admitting: Licensed Clinical Social Worker

## 2018-01-11 ENCOUNTER — Telehealth: Payer: Self-pay

## 2018-01-11 NOTE — Telephone Encounter (Signed)
-----   Message from Runell GessJonathan J Berry, MD sent at 12/27/2017  1:27 PM EDT ----- Normal LV systolic function, grade 1 diastolic dysfunction, mild pulmonary hypertension.

## 2018-01-11 NOTE — Telephone Encounter (Signed)
Pt aware of echo results with verbal understanding. Pt wanted to know what could be the cause of the elevated pressures in her lungs. Adv pt that it could be due to OSA.  Pt sts that she was dx with OSA in 2009 and was seen by Dr. Armanda Magicraci Turner several years ago. Her OSA has been untreated since that time. Adv pt that I will fwd a message to Dr.Turner's sleep coordinator to call her to ger her back in, she would be considered a new pt with Dr.Turner. Pt questions answered, adv her that I will fwd a message to Dr.Turner's sleep coordinator Coralee Northina, CMA to call her to schedule and reestablish care.

## 2018-01-12 ENCOUNTER — Ambulatory Visit (HOSPITAL_COMMUNITY): Payer: BLUE CROSS/BLUE SHIELD | Admitting: Licensed Clinical Social Worker

## 2018-01-13 ENCOUNTER — Ambulatory Visit: Payer: Self-pay | Admitting: Family

## 2018-01-13 ENCOUNTER — Telehealth: Payer: Self-pay | Admitting: *Deleted

## 2018-01-13 NOTE — Telephone Encounter (Signed)
Patient has been scheduled for 05/03/2018 at 8 am to re-establish sleep therapy Pt is aware and agreeable to treatment..Marland Kitchen

## 2018-01-13 NOTE — Telephone Encounter (Signed)
-----   Message from Jarvis NewcomerLisa S Parris-Godley, RN sent at 01/11/2018 12:08 PM EST ----- Regarding: Pt needs a new pt appt with Dr.Tuuner Carney CornersHey Nina,  This pt has seen Dr.Turner in the past for OSA. She had a sleep study in 2009. She last saw Dr.turner when she was with [facility], her OSA has been untreated since that time. She will need to re-establish care. Could you please cal her to scheduled a new pt sleep appt.   Misty StanleyLisa

## 2018-01-17 ENCOUNTER — Ambulatory Visit (HOSPITAL_COMMUNITY): Payer: BLUE CROSS/BLUE SHIELD | Admitting: Licensed Clinical Social Worker

## 2018-01-18 ENCOUNTER — Ambulatory Visit: Payer: BLUE CROSS/BLUE SHIELD

## 2018-01-18 NOTE — Progress Notes (Deleted)
01/18/2018 Carly Delacruz 10/06/1965 [id number]   HPI:  Carly Delacruz is a 52 y.o. female patient of Dr Allyson Sabal, with a PMH below who presents today for hypertension clinic evaluation.  In addition to hypertension, her medical history is significant for morbid obesity (s/p gastric bypass)  Blood Pressure Goal:  130/80  Current Medications:  Family Hx:  Social Hx:  Diet:  Exercise:  Home BP readings:  Intolerances:   Labs:  Wt Readings from Last 3 Encounters:  01/04/18 240 lb (108.9 kg)  12/21/17 246 lb (111.6 kg)  12/14/17 240 lb (108.9 kg)   BP Readings from Last 3 Encounters:  01/04/18 (!) 154/89  12/21/17 (!) 134/98  12/14/17 (!) 122/100   Pulse Readings from Last 3 Encounters:  01/04/18 98  12/21/17 (!) 102  12/14/17 (!) 102    Current Outpatient Medications  Medication Sig Dispense Refill  . amitriptyline (ELAVIL) 150 MG tablet Take 1 tablet (150 mg total) by mouth at bedtime. 90 tablet 0  . carvedilol (COREG) 3.125 MG tablet Take 1 tablet (3.125 mg total) by mouth 2 (two) times daily. 180 tablet 3  . diazepam (VALIUM) 5 MG tablet Place 5 mg vaginally at bedtime.     . famotidine (PEPCID) 20 MG tablet Take 1 tablet (20 mg total) by mouth 2 (two) times daily. 60 tablet 3  . FeFum-FePoly-FA-B Cmp-C-Biot (FOLIVANE-PLUS) CAPS Take 1 capsule by mouth daily.     Marland Kitchen FLUoxetine (PROZAC) 40 MG capsule Take 1 capsule (40 mg total) by mouth daily. 90 capsule 0  . furosemide (LASIX) 20 MG tablet Take 1 tablet (20 mg total) by mouth daily. 30 tablet 1  . gabapentin (NEURONTIN) 300 MG capsule Take 1 capsule (300 mg total) by mouth 2 (two) times daily.    . hydrOXYzine (VISTARIL) 25 MG capsule Take 25 mg by mouth as needed.  2  . metoprolol tartrate (LOPRESSOR) 100 MG tablet Take 1 tablet (100 mg total) by mouth once for 1 dose. Take 2 hours before test 1 tablet 0  . OVER THE COUNTER MEDICATION Ultra joint complex vitamin    . potassium chloride (K-DUR,KLOR-CON)  10 MEQ tablet Take 1 tablet (10 mEq total) by mouth daily. 60 tablet 0  . tapentadol (NUCYNTA) 50 MG tablet Take 1 tablet (50 mg total) by mouth every 12 (twelve) hours as needed for moderate pain. (Patient taking differently: Take 50 mg by mouth 3 (three) times daily. )    . tiZANidine (ZANAFLEX) 4 MG tablet Take 4 mg by mouth 2 (two) times daily.     . Zinc 50 MG CAPS Take 50 mg by mouth.     No current facility-administered medications for this visit.     Allergies  Allergen Reactions  . Morphine And Related Shortness Of Breath and Itching  . Septra [Sulfamethoxazole-Trimethoprim] Anaphylaxis and Hives  . Sulfa Antibiotics Anaphylaxis, Hives and Swelling    Throat swelling  . Sulfamethoxazole-Trimethoprim Anaphylaxis and Hives  . Sulfasalazine Anaphylaxis, Hives, Swelling and Other (See Comments)    Throat swelling  . Ultram [Tramadol] Shortness Of Breath, Anxiety and Other (See Comments)    "panic attacks"   . Ciprofloxacin Other (See Comments)    This medication was given along with a sulfa. Reaction is unknown at this time. Must list due to possible allergy.  Leodis [place] [Propoxyphene N-Acetaminophen] Itching  . Adhesive [Tape] Rash  . Codeine Itching and Other (See Comments)    No change when benadryl is given  .  Demerol [Meperidine] Nausea Only  . Hydromorphone Itching and Other (See Comments)    No change when benadryl is given  . Losartan Palpitations    Past Medical History:  Diagnosis Date  . Allergy   . Anemia   . Anxiety   . Arthritis 12/10/03  . Atrophic vaginitis 2009  . BV (bacterial vaginosis) 2006  . Chronic back pain   . Chronic LLQ pain   . Chronic post-traumatic stress disorder (PTSD)   . Complication of anesthesia   . Elevated LFTs   . Endometriosis   . Fatigue 2012  . Gallstones   . GERD (gastroesophageal reflux disease)   . H/O bladder infections   . H/O mumps   . H/O varicella   . History of candidal vulvovaginitis 03/21/03  . HTN  (hypertension)   . Insomnia   . Insomnia secondary to situational depression 06/06/07  . Liver mass, right lobe 08/13/04   Nodule  . Malaise 2012  . Morbid obesity (HCC) 2005  . Osteoporosis   . S/P gastric bypass   . Severe episode of recurrent major depressive disorder, without psychotic features (HCC)   . Sleep apnea 2005  . SUI (stress urinary incontinence, female) 08/12/03  . Trichomonas   . Vulvar lesion 03/21/03  . Yeast infection     There were no vitals taken for this visit.  No problem-specific Assessment & Plan notes found for this encounter.   Phillips HayKristin Ples Trudel PharmD CPP [facility] [address] [place], [place] [id number] [phone] [id number]

## 2018-01-19 ENCOUNTER — Ambulatory Visit: Payer: BLUE CROSS/BLUE SHIELD

## 2018-01-23 DIAGNOSIS — G894 Chronic pain syndrome: Secondary | ICD-10-CM | POA: Diagnosis not present

## 2018-01-23 DIAGNOSIS — G4700 Insomnia, unspecified: Secondary | ICD-10-CM | POA: Diagnosis not present

## 2018-01-23 DIAGNOSIS — M461 Sacroiliitis, not elsewhere classified: Secondary | ICD-10-CM | POA: Diagnosis not present

## 2018-01-23 DIAGNOSIS — M47816 Spondylosis without myelopathy or radiculopathy, lumbar region: Secondary | ICD-10-CM | POA: Diagnosis not present

## 2018-01-23 MED FILL — HYDROXYZINE PAM 25 MG CAP: 25 | 30 days supply | Qty: 90 | Fill #0

## 2018-01-23 MED FILL — GABAPENTIN 300 MG CAPSULE: 300 | 30 days supply | Qty: 60 | Fill #1

## 2018-01-23 MED FILL — CARVEDILOL 3.125 MG TABLET: 3.125 | 90 days supply | Qty: 180 | Fill #1

## 2018-01-23 MED FILL — NUCYNTA 50 MG TABS: 50 | 30 days supply | Qty: 90 | Fill #0

## 2018-01-24 ENCOUNTER — Ambulatory Visit (INDEPENDENT_AMBULATORY_CARE_PROVIDER_SITE_OTHER): Payer: BLUE CROSS/BLUE SHIELD | Admitting: Pharmacist

## 2018-01-24 ENCOUNTER — Ambulatory Visit (HOSPITAL_COMMUNITY): Payer: BLUE CROSS/BLUE SHIELD | Admitting: Licensed Clinical Social Worker

## 2018-01-24 VITALS — BP 124/86 | HR 93 | Resp 17 | Ht 63.0 in

## 2018-01-24 DIAGNOSIS — I10 Essential (primary) hypertension: Secondary | ICD-10-CM

## 2018-01-24 MED ORDER — FUROSEMIDE 20 MG PO TABS: 20.0000 mg | ORAL_TABLET | Freq: Every day | ORAL | 1 refills | Status: DC | PRN

## 2018-01-24 MED ORDER — CARVEDILOL 3.125 MG PO TABS: 3.1250 mg | ORAL_TABLET | Freq: Two times a day (BID) | ORAL | 1 refills | Status: DC

## 2018-01-24 NOTE — Patient Instructions (Addendum)
Return for a  follow up appointment in 4 weeks  Check your blood pressure at home daily (if able) and keep record of the readings.  Take your BP meds as follows:  *CHANGE furosemide to 20mg  daily as needed for fluid retention* *STOP taking potassium supplement* *CONTINUE taking carvedilol 3.125mg  twice daily*  Bring all of your meds, your BP cuff and your record of home blood pressures to your next appointment.  Exercise as you're able, try to walk approximately 30 minutes per day.  Keep salt intake to a minimum, especially watch canned and prepared boxed foods.  Eat more fresh fruits and vegetables and fewer canned items.  Avoid eating in fast food restaurants.    HOW TO TAKE YOUR BLOOD PRESSURE: . Rest 5 minutes before taking your blood pressure. .  Don't smoke or drink caffeinated beverages for at least 30 minutes before. . Take your blood pressure before (not after) you eat. . Sit comfortably with your back supported and both feet on the floor (don't cross your legs). . Elevate your arm to heart level on a table or a desk. . Use the proper sized cuff. It should fit smoothly and snugly around your bare upper arm. There should be enough room to slip a fingertip under the cuff. The bottom edge of the cuff should be 1 inch above the crease of the elbow. . Ideally, take 3 measurements at one sitting and record the average.

## 2018-01-24 NOTE — Progress Notes (Signed)
Patient ID: Carly Pihamela J Santacroce                 DOB: 08/08/1965                      MRN: [id number]     HPI: Carly Delacruz is a 52 y.o. female referred by Dr. Allyson SabalBerry to HTN clinic. PMH includes uncontrolled hypertension, OSA not on CPAP after weight loss, gastric bypass, atypical chest pain, and lower extremity edema. Patient expressed some issues communicating due to mental health problems and requests "patientce" from us. "Some times has problems expressing and takes her longer to get works out". Denies problems with dizziness, increased swelling, palpitations, blurry vision or increase fatigue. Reports resolution of lowe extremity edema as well. She took her 1st carvedilol dose this morning (about 3 hour before appointment) because she was worried the combination with furosemide will cause hypotension or any other problems.   Current HTN meds:  Carvedilol 3.125mg  twice daily - 1st dose this morning Furosemide 20mg  daily - for edema  Previously tried:  Losartan - palpitatoin Amlodipine - edema  BP goal: 130/80  Family History: hypertension, DVT ,and colitis in sister; OSA and kidney disease in brother  Social History: denies tobacco or alcohol use  Diet: balanced low sodium diet  Exercise: activities of daily living (ambulates with walker and had chronic leg pain)  Home BP readings: none provided today  Wt Readings from Last 3 Encounters:  01/04/18 240 lb (108.9 kg)  12/21/17 246 lb (111.6 kg)  12/14/17 240 lb (108.9 kg)   BP Readings from Last 3 Encounters:  01/24/18 124/86  01/04/18 (!) 154/89  12/21/17 (!) 134/98   Pulse Readings from Last 3 Encounters:  01/24/18 93  01/04/18 98  12/21/17 (!) 102    Past Medical History:  Diagnosis Date  . Allergy   . Anemia   . Anxiety   . Arthritis 12/10/03  . Atrophic vaginitis 2009  . BV (bacterial vaginosis) 2006  . Chronic back pain   . Chronic LLQ pain   . Chronic post-traumatic stress disorder (PTSD)   . Complication  of anesthesia   . Elevated LFTs   . Endometriosis   . Fatigue 2012  . Gallstones   . GERD (gastroesophageal reflux disease)   . H/O bladder infections   . H/O mumps   . H/O varicella   . History of candidal vulvovaginitis 03/21/03  . HTN (hypertension)   . Insomnia   . Insomnia secondary to situational depression 06/06/07  . Liver mass, right lobe 08/13/04   Nodule  . Malaise 2012  . Morbid obesity (HCC) 2005  . Osteoporosis   . S/P gastric bypass   . Severe episode of recurrent major depressive disorder, without psychotic features (HCC)   . Sleep apnea 2005  . SUI (stress urinary incontinence, female) 08/12/03  . Trichomonas   . Vulvar lesion 03/21/03  . Yeast infection     Current Outpatient Medications on File Prior to Visit  Medication Sig Dispense Refill  . amitriptyline (ELAVIL) 150 MG tablet Take 1 tablet (150 mg total) by mouth at bedtime. 90 tablet 0  . diazepam (VALIUM) 5 MG tablet Place 5 mg vaginally at bedtime.     . famotidine (PEPCID) 20 MG tablet Take 1 tablet (20 mg total) by mouth 2 (two) times daily. 60 tablet 3  . FeFum-FePoly-FA-B Cmp-C-Biot (FOLIVANE-PLUS) CAPS Take 1 capsule by mouth daily.     Marland Kitchen. FLUoxetine (PROZAC)  40 MG capsule Take 1 capsule (40 mg total) by mouth daily. 90 capsule 0  . gabapentin (NEURONTIN) 300 MG capsule Take 1 capsule (300 mg total) by mouth 2 (two) times daily.    . hydrOXYzine (VISTARIL) 25 MG capsule Take 25 mg by mouth as needed.  2  . tapentadol (NUCYNTA) 50 MG tablet Take 1 tablet (50 mg total) by mouth every 12 (twelve) hours as needed for moderate pain. (Patient taking differently: Take 50 mg by mouth 3 (three) times daily. )    . tiZANidine (ZANAFLEX) 4 MG tablet Take 4 mg by mouth 2 (two) times daily.     . Zinc 50 MG CAPS Take 50 mg by mouth.     No current facility-administered medications on file prior to visit.     Allergies  Allergen Reactions  . Morphine And Related Shortness Of Breath and Itching  . Septra  [Sulfamethoxazole-Trimethoprim] Anaphylaxis and Hives  . Sulfa Antibiotics Anaphylaxis, Hives and Swelling    Throat swelling  . Sulfamethoxazole-Trimethoprim Anaphylaxis and Hives  . Sulfasalazine Anaphylaxis, Hives, Swelling and Other (See Comments)    Throat swelling  . Ultram [Tramadol] Shortness Of Breath, Anxiety and Other (See Comments)    "panic attacks"   . Ciprofloxacin Other (See Comments)    This medication was given along with a sulfa. Reaction is unknown at this time. Must list due to possible allergy.  Leodis Liverpool [Propoxyphene N-Acetaminophen] Itching  . Adhesive [Tape] Rash  . Codeine Itching and Other (See Comments)    No change when benadryl is given  . Demerol [Meperidine] Nausea Only  . Hydromorphone Itching and Other (See Comments)    No change when benadryl is given  . Losartan Palpitations    Blood pressure 124/86, pulse 93, resp. rate 17, height 5\' 3"  (1.6 m).  Essential hypertension Blood pressure remains slightly above goal but patient had1st carvedilol dose this morning. Significant time was spent educating patient on current medication, compliance and monitring. Will change furosemide to 20mg  daily as needed for edema, continue carvedilol 3.125mg  twice daily, and stop potassium supplement for now. Plan to follow up in 2 week and titrate medication as needed for BP and edema management.    Hollin Crewe Rodriguez-Guzman PharmD, BCPS, CPP [facility] [address] [place] [address] 01/25/2018 8:51 AM

## 2018-01-25 ENCOUNTER — Encounter: Payer: Self-pay | Admitting: Pharmacist

## 2018-01-25 NOTE — Assessment & Plan Note (Signed)
Blood pressure remains slightly above goal but patient had1st carvedilol dose this morning. Significant time was spent educating patient on current medication, compliance and monitring. Will change furosemide to 20mg  daily as needed for edema, continue carvedilol 3.125mg  twice daily, and stop potassium supplement for now. Plan to follow up in 2 week and titrate medication as needed for BP and edema management.

## 2018-01-26 ENCOUNTER — Other Ambulatory Visit: Payer: Self-pay

## 2018-01-26 ENCOUNTER — Emergency Department (HOSPITAL_BASED_OUTPATIENT_CLINIC_OR_DEPARTMENT_OTHER)
Admission: EM | Admit: 2018-01-26 | Discharge: 2018-01-26 | Disposition: A | Discharge status: Home / Self Care | Payer: BLUE CROSS/BLUE SHIELD | Attending: Emergency Medicine | Admitting: Emergency Medicine

## 2018-01-26 ENCOUNTER — Emergency Department (HOSPITAL_BASED_OUTPATIENT_CLINIC_OR_DEPARTMENT_OTHER): Payer: BLUE CROSS/BLUE SHIELD

## 2018-01-26 ENCOUNTER — Encounter (HOSPITAL_BASED_OUTPATIENT_CLINIC_OR_DEPARTMENT_OTHER): Payer: Self-pay | Admitting: Emergency Medicine

## 2018-01-26 DIAGNOSIS — M25511 Pain in right shoulder: Secondary | ICD-10-CM | POA: Diagnosis not present

## 2018-01-26 DIAGNOSIS — M549 Dorsalgia, unspecified: Secondary | ICD-10-CM | POA: Diagnosis not present

## 2018-01-26 DIAGNOSIS — I10 Essential (primary) hypertension: Secondary | ICD-10-CM | POA: Diagnosis not present

## 2018-01-26 DIAGNOSIS — Z79899 Other long term (current) drug therapy: Secondary | ICD-10-CM | POA: Diagnosis not present

## 2018-01-26 DIAGNOSIS — S4991XA Unspecified injury of right shoulder and upper arm, initial encounter: Secondary | ICD-10-CM | POA: Diagnosis not present

## 2018-01-26 DIAGNOSIS — S8991XA Unspecified injury of right lower leg, initial encounter: Secondary | ICD-10-CM | POA: Diagnosis not present

## 2018-01-26 DIAGNOSIS — M791 Myalgia, unspecified site: Secondary | ICD-10-CM | POA: Diagnosis not present

## 2018-01-26 DIAGNOSIS — S299XXA Unspecified injury of thorax, initial encounter: Secondary | ICD-10-CM | POA: Diagnosis not present

## 2018-01-26 DIAGNOSIS — W19XXXA Unspecified fall, initial encounter: Secondary | ICD-10-CM

## 2018-01-26 DIAGNOSIS — M542 Cervicalgia: Secondary | ICD-10-CM | POA: Diagnosis not present

## 2018-01-26 DIAGNOSIS — S3992XA Unspecified injury of lower back, initial encounter: Secondary | ICD-10-CM | POA: Diagnosis not present

## 2018-01-26 DIAGNOSIS — M545 Low back pain: Secondary | ICD-10-CM | POA: Diagnosis not present

## 2018-01-26 DIAGNOSIS — R51 Headache: Secondary | ICD-10-CM | POA: Insufficient documentation

## 2018-01-26 DIAGNOSIS — S79911A Unspecified injury of right hip, initial encounter: Secondary | ICD-10-CM | POA: Diagnosis not present

## 2018-01-26 DIAGNOSIS — M25561 Pain in right knee: Secondary | ICD-10-CM | POA: Diagnosis not present

## 2018-01-26 DIAGNOSIS — M25551 Pain in right hip: Secondary | ICD-10-CM | POA: Diagnosis not present

## 2018-01-26 DIAGNOSIS — S199XXA Unspecified injury of neck, initial encounter: Secondary | ICD-10-CM | POA: Diagnosis not present

## 2018-01-26 DIAGNOSIS — R0789 Other chest pain: Secondary | ICD-10-CM | POA: Diagnosis not present

## 2018-01-26 MED ORDER — METHOCARBAMOL 500 MG PO TABS: 500.0000 mg | ORAL_TABLET | Freq: Three times a day (TID) | ORAL | 0 refills | Status: DC | PRN

## 2018-01-26 MED ORDER — KETOROLAC TROMETHAMINE 60 MG/2ML IM SOLN
30.0000 mg | Freq: Once | INTRAMUSCULAR | Status: AC
  Administered 2018-01-26: 30 mg via INTRAMUSCULAR
  Filled 2018-01-26: qty 2

## 2018-01-26 MED ORDER — METHOCARBAMOL 500 MG PO TABS
500.0000 mg | ORAL_TABLET | Freq: Once | ORAL | Status: AC
  Administered 2018-01-26: 500 mg via ORAL
  Filled 2018-01-26: qty 1

## 2018-01-26 NOTE — ED Notes (Signed)
Patient transported to X-ray 

## 2018-01-26 NOTE — ED Provider Notes (Signed)
[facility] EMERGENCY DEPARTMENT Provider Note   CSN: [id number] Arrival date & time: 01/26/18  4401     History   Chief Complaint Chief Complaint  Patient presents with  . Fall    HPI Carly Delacruz is a 52 y.o. female with a hx of sleep apnea, morbid obesity, chronic PTSD, anxiety, depression, HTN, who is s/p gastric bypass who presents to the ED s/p fall yesterday at 0300 AM. Patient was sleeping and rolled over resulting in a fall out of bed. She states that she hit the right side of her face/head, and is unsure if she had LOC, she does not really remember the fall. She was able to get up and ambulate with her walker that she uses at baseline. However over the next 24 hours she developed generalized body aches, she states she is hurting "all over." She is having pain to the head, neck, back, R shoulder, and legs bilaterally R>L. She states pain is an 8/10 in severity. Worse with movement and ambulation, mildly alleviated by topical icy hot. Patient states she has chronic back pain and this has flared it. States she has baseline paresthesias to the feet bilaterally which are unchanged. Denies fever, chills, nausea, vomiting, seizure activity, change in vision, focal numbness/weakness, chest pain, or abdominal pain.   HPI  Past Medical History:  Diagnosis Date  . Allergy   . Anemia   . Anxiety   . Arthritis 12/10/03  . Atrophic vaginitis 2009  . BV (bacterial vaginosis) 2006  . Chronic back pain   . Chronic LLQ pain   . Chronic post-traumatic stress disorder (PTSD)   . Complication of anesthesia   . Elevated LFTs   . Endometriosis   . Fatigue 2012  . Gallstones   . GERD (gastroesophageal reflux disease)   . H/O bladder infections   . H/O mumps   . H/O varicella   . History of candidal vulvovaginitis 03/21/03  . HTN (hypertension)   . Insomnia   . Insomnia secondary to situational depression 06/06/07  . Liver mass, right lobe 08/13/04   Nodule  . Malaise 2012   . Morbid obesity (HCC) 2005  . Osteoporosis   . S/P gastric bypass   . Severe episode of recurrent major depressive disorder, without psychotic features (HCC)   . Sleep apnea 2005  . SUI (stress urinary incontinence, female) 08/12/03  . Trichomonas   . Vulvar lesion 03/21/03  . Yeast infection     Patient Active Problem List   Diagnosis Date Noted  . Atypical chest pain 12/21/2017  . Lower extremity edema 12/21/2017  . Encounter for general adult medical examination with abnormal findings 08/04/2017  . Depression 05/08/2017  . Glaucoma 05/02/2017  . Chronic back pain 08/12/2016  . Anemia 08/12/2016  . H/O bladder infections 08/12/2016  . S/P gastric bypass 08/12/2016  . Polypharmacy   . Sleep-disordered breathing 07/26/2014  . GERD (gastroesophageal reflux disease) 06/15/2014  . Neuropathy 06/15/2014  . Essential hypertension 06/15/2014  . Obesity 05/08/2013  . Morbid obesity (HCC) 03/02/2003    Past Surgical History:  Procedure Laterality Date  . ABDOMINAL HYSTERECTOMY  2001  . APPENDECTOMY  2001  . CHOLECYSTECTOMY  2011  . DILATION AND CURETTAGE OF UTERUS    . GASTRIC BYPASS  2007   [facility], Dr. Daphine Deutscher  . KNEE ARTHROSCOPY Left   . LAPAROTOMY    . TONSILLECTOMY    . WISDOM TOOTH EXTRACTION       OB  History    Gravida  0   Para  0   Term      Preterm      AB      Living        SAB      TAB      Ectopic      Multiple      Live Births               Home Medications    Prior to Admission medications   Medication Sig Start Date End Date Taking? Authorizing Provider  amitriptyline (ELAVIL) 150 MG tablet Take 1 tablet (150 mg total) by mouth at bedtime. 10/03/17 10/03/18  Burnard Leigh, MD  carvedilol (COREG) 3.125 MG tablet Take 1 tablet (3.125 mg total) by mouth 2 (two) times daily. 01/24/18   Runell Gess, MD  diazepam (VALIUM) 5 MG tablet Place 5 mg vaginally at bedtime.     [provider]  famotidine  (PEPCID) 20 MG tablet Take 1 tablet (20 mg total) by mouth 2 (two) times daily. 12/14/17   Evaristo Bury, NP  FeFum-FePoly-FA-B Cmp-C-Biot (FOLIVANE-PLUS) CAPS Take 1 capsule by mouth daily.     [provider]  FLUoxetine (PROZAC) 40 MG capsule Take 1 capsule (40 mg total) by mouth daily. 10/03/17   Eksir, Bo Mcclintock, MD  furosemide (LASIX) 20 MG tablet Take 1 tablet (20 mg total) by mouth daily as needed for fluid or edema. 01/24/18   Runell Gess, MD  gabapentin (NEURONTIN) 300 MG capsule Take 1 capsule (300 mg total) by mouth 2 (two) times daily. 08/16/16   Hongalgi, Maximino Greenland, MD  hydrOXYzine (VISTARIL) 25 MG capsule Take 25 mg by mouth as needed. 11/21/17   [provider]  tapentadol (NUCYNTA) 50 MG tablet Take 1 tablet (50 mg total) by mouth every 12 (twelve) hours as needed for moderate pain. Patient taking differently: Take 50 mg by mouth 3 (three) times daily.  08/16/16   Hongalgi, Maximino Greenland, MD  tiZANidine (ZANAFLEX) 4 MG tablet Take 4 mg by mouth 2 (two) times daily.     [provider]  Zinc 50 MG CAPS Take 50 mg by mouth.    [provider]    Family History Family History  Problem Relation Age of Onset  . Diabetes Paternal Grandmother   . Leukemia Maternal Grandmother   . Arthritis Mother   . Prostate cancer Father   . Colitis Sister   . Hypertension Sister   . Deep vein thrombosis Sister   . Kidney disease Brother   . Obstructive Sleep Apnea Brother     Social History Social History   Tobacco Use  . Smoking status: Never Smoker  . Smokeless tobacco: Never Used  Substance Use Topics  . Alcohol use: No  . Drug use: No     Allergies   Morphine and related; Septra [sulfamethoxazole-trimethoprim]; Sulfa antibiotics; Sulfamethoxazole-trimethoprim; Sulfasalazine; Ultram [tramadol]; Ciprofloxacin; Darvocet [propoxyphene n-acetaminophen]; Adhesive [tape]; Codeine; Demerol [meperidine]; Hydromorphone; and Losartan   Review of  Systems Review of Systems  Constitutional: Negative for chills and fever.  Respiratory: Negative for shortness of breath.   Cardiovascular: Negative for chest pain, palpitations and leg swelling.  Gastrointestinal: Negative for abdominal pain, nausea and vomiting.  Musculoskeletal: Positive for arthralgias, back pain, myalgias and neck pain.  Neurological: Positive for headaches. Negative for seizures, facial asymmetry, speech difficulty, weakness and numbness.  All other systems reviewed and are negative.    Physical  Exam Updated Vital Signs BP (!) 150/90   Pulse 94   Temp 97.7 F (36.5 C)   Resp 16   Ht 5\' 3"  (1.6 m)   Wt 121.1 kg   SpO2 97%   BMI 47.30 kg/m   Physical Exam  Constitutional: She appears well-developed and well-nourished. No distress.  HENT:  Head: Normocephalic and atraumatic. Head is without raccoon's eyes and without Battle's sign.  Right Ear: No hemotympanum.  Left Ear: No hemotympanum.  Mouth/Throat: Oropharynx is clear and moist.  Eyes: Pupils are equal, round, and reactive to light. Conjunctivae and EOM are normal. Right eye exhibits no discharge. Left eye exhibits no discharge.  Neck: Normal range of motion. Spinous process tenderness (diffuse non focal, no palpable step off) and muscular tenderness (bilateral) present.  Cardiovascular: Normal rate and regular rhythm.  No murmur heard. Pulses:      Radial pulses are 2+ on the right side, and 2+ on the left side.       Posterior tibial pulses are 2+ on the right side, and 2+ on the left side.  Pulmonary/Chest: Breath sounds normal. No respiratory distress. She has no wheezes. She has no rales. She exhibits no tenderness.  Abdominal: Soft. She exhibits no distension. There is no tenderness.  Musculoskeletal:  No obvious deformity, appreciable swelling, erythema, ecchymosis, warmth, or open wounds. Upper extremities: Patient has full active range of motion to all joints of the upper extremity with the  exception of right shoulder flexion being somewhat limited secondary to pain.  She is able to actively flex just past 90 degrees.  She has full intact passive range of motion of the right shoulder with flexion.  She is tender to palpation over the right shoulder diffusely without point/focal bony tenderness to palpation.  Upper extremities are otherwise nontender. Back: Patient is diffusely tender to the cervical, thoracic, and lumbar region without point/focal tenderness to the midline.  There is no palpable step-off. Lower extremities: Patient has full active range of motion to bilateral hips, knees, ankles, and all digits.  She does have discomfort and is a bit slower with right hip flexion and right knee flexion.  She is tender diffusely about the right hip and right knee without point/focal bony tenderness.  Lower extremities are otherwise without significant tenderness to palpation.  Neurological:  .  Clear speech.  CN III through XII grossly intact.  Sensation grossly intact bilateral upper and lower extremities.  5 out of 5 symmetric grip strength.  5 out of 5 strength with plantar dorsiflexion bilaterally.  Able to slowly ambulate with assistance, antalgic gait noted.   Skin: Skin is warm and dry. No rash noted.  Psychiatric: She has a normal mood and affect. Her behavior is normal.  Nursing note and vitals reviewed.    ED Treatments / Results  Labs (all labs ordered are listed, but only abnormal results are displayed) Labs Reviewed - No data to display  EKG None  Radiology Dg Thoracic Spine 2 View  Result Date: 01/26/2018 CLINICAL DATA:  Recent fall with right-sided chest pain, initial encounter EXAM: THORACIC SPINE 2 VIEWS COMPARISON:  None. FINDINGS: Vertebral body height is well maintained. No paraspinal mass lesion is noted. No definitive rib abnormality is seen. IMPRESSION: No acute abnormality noted. Electronically Signed   By: Alcide CleverMark  Lukens M.D.   On: 01/26/2018 12:03   Dg  Lumbar Spine Complete  Result Date: 01/26/2018 CLINICAL DATA:  Fall yesterday with right-sided back pain, initial encounter EXAM: LUMBAR SPINE -  COMPLETE 4+ VIEW COMPARISON:  None. FINDINGS: Five lumbar type vertebral bodies are well visualized. No pars defects are seen. No anterolisthesis is noted. No compression deformities are noted. No gross soft tissue abnormality is seen. IMPRESSION: No acute abnormality noted. Electronically Signed   By: Alcide Clever M.D.   On: 01/26/2018 12:02   Dg Shoulder Right  Result Date: 01/26/2018 CLINICAL DATA:  Fall yesterday with persistent right-sided shoulder pain, initial encounter EXAM: RIGHT SHOULDER - 2+ VIEW COMPARISON:  None. FINDINGS: No acute fracture or dislocation is noted. The underlying bony thorax is within normal limits. IMPRESSION: No acute abnormality noted. Electronically Signed   By: Alcide Clever M.D.   On: 01/26/2018 11:53   Ct Head Wo Contrast  Result Date: 01/26/2018 CLINICAL DATA:  Recent falls with headaches and neck pain, initial encounter EXAM: CT HEAD WITHOUT CONTRAST CT CERVICAL SPINE WITHOUT CONTRAST TECHNIQUE: Multidetector CT imaging of the head and cervical spine was performed following the standard protocol without intravenous contrast. Multiplanar CT image reconstructions of the cervical spine were also generated. COMPARISON:  None. FINDINGS: CT HEAD FINDINGS Brain: No evidence of acute infarction, hemorrhage, hydrocephalus, extra-axial collection or mass lesion/mass effect. Vascular: No hyperdense vessel or unexpected calcification. Skull: Normal. Negative for fracture or focal lesion. Sinuses/Orbits: No acute finding. Other: None. CT CERVICAL SPINE FINDINGS Alignment: Within normal limits. Skull base and vertebrae: 7 cervical segments are well visualized. Vertebral body height is well maintained. No acute fracture or acute facet abnormality is noted. Mild facet hypertrophic changes are seen. Soft tissues and spinal canal:  Surrounding soft tissues are within normal limits. Upper chest: Within normal limits. Other: None IMPRESSION: CT of the head: No acute intracranial abnormality noted. CT of cervical spine: Degenerative change without acute abnormality. Electronically Signed   By: Alcide Clever M.D.   On: 01/26/2018 11:40   Ct Cervical Spine Wo Contrast  Result Date: 01/26/2018 CLINICAL DATA:  Recent falls with headaches and neck pain, initial encounter EXAM: CT HEAD WITHOUT CONTRAST CT CERVICAL SPINE WITHOUT CONTRAST TECHNIQUE: Multidetector CT imaging of the head and cervical spine was performed following the standard protocol without intravenous contrast. Multiplanar CT image reconstructions of the cervical spine were also generated. COMPARISON:  None. FINDINGS: CT HEAD FINDINGS Brain: No evidence of acute infarction, hemorrhage, hydrocephalus, extra-axial collection or mass lesion/mass effect. Vascular: No hyperdense vessel or unexpected calcification. Skull: Normal. Negative for fracture or focal lesion. Sinuses/Orbits: No acute finding. Other: None. CT CERVICAL SPINE FINDINGS Alignment: Within normal limits. Skull base and vertebrae: 7 cervical segments are well visualized. Vertebral body height is well maintained. No acute fracture or acute facet abnormality is noted. Mild facet hypertrophic changes are seen. Soft tissues and spinal canal: Surrounding soft tissues are within normal limits. Upper chest: Within normal limits. Other: None IMPRESSION: CT of the head: No acute intracranial abnormality noted. CT of cervical spine: Degenerative change without acute abnormality. Electronically Signed   By: Alcide Clever M.D.   On: 01/26/2018 11:40   Dg Knee Complete 4 Views Right  Result Date: 01/26/2018 CLINICAL DATA:  Fall yesterday with right-sided knee pain, initial encounter EXAM: RIGHT KNEE - COMPLETE 4+ VIEW COMPARISON:  None. FINDINGS: Tricompartmental degenerative changes are noted worst in the medial joint space. No  acute fracture or dislocation is seen. No soft tissue abnormality is noted. No joint effusion is seen. IMPRESSION: Degenerative change without acute abnormality. Electronically Signed   By: Alcide Clever M.D.   On: 01/26/2018 11:59   Dg Hip  Unilat With Pelvis 2-3 Views Right  Result Date: 01/26/2018 CLINICAL DATA:  Fall yesterday with right-sided hip pain, initial encounter EXAM: DG HIP (WITH OR WITHOUT PELVIS) 3V RIGHT COMPARISON:  None. FINDINGS: Pelvic ring is intact. No acute fracture or dislocation is seen. No soft tissue abnormality is noted. IMPRESSION: No acute abnormality seen. Electronically Signed   By: Alcide Clever M.D.   On: 01/26/2018 11:58    Procedures Procedures (including critical care time)  Medications Ordered in ED Medications  methocarbamol (ROBAXIN) tablet 500 mg (has no administration in time range)  ketorolac (TORADOL) injection 30 mg (has no administration in time range)     Initial Impression / Assessment and Plan / ED Course  I have reviewed the triage vital signs and the nursing notes.  Pertinent labs & imaging results that were available during my care of the patient were reviewed by me and considered in my medical decision making (see chart for details).   Patient presents to the emergency department status post fall yesterday midmorning after rolling over in bed and out of the bed.  Nontoxic-appearing, no apparent distress, vitals WNL with the exception of initially elevated BP, normalized on repeat vitals, doubt HTN emergency.  Patient with generalized achiness all over.  Does report head injury without concrete knowledge of the events of the fall therefore CT head was obtained and negative for acute intracranial abnormality.  Given her diffuse tenderness to the cervical, thoracic, and lumbar region including midline and bilateral paraspinal imaging was obtained in these areas and also negative for acute findings.  Given lack of point/focal vertebral tenderness  without focal neurologic deficit and re-assuring imaging I doubt intracranial bleed or vertebral fracture/dislocation. Extremity imaging obtained in areas of tenderness are negative for fracture or dislocation, she is NVI distal to each of these areas. Patient is ambulatory, uses a walker at baseline. Likely soreness following the fall. Feel she is safe for discharge. She requested toradol IM prior to DC as she has done well with this in the past- feel this is reasonable. Will provide prescription for robaxin, instructed she is not to drive or operate heavy machinery with this medicine. PCP follow up. I discussed results, treatment plan, need for follow-up, and return precautions with the patient. Provided opportunity for questions, patient confirmed understanding and is in agreement with plan.   Vitals:   01/26/18 0921 01/26/18 1146  BP: (!) 150/90 139/84  Pulse: 94 88  Resp: 16 16  Temp: 97.7 F (36.5 C)   SpO2: 97% 100%    Final Clinical Impressions(s) / ED Diagnoses   Final diagnoses:  Fall, initial encounter    ED Discharge Orders         Ordered    methocarbamol (ROBAXIN) 500 MG tablet  Every 8 hours PRN     01/26/18 1238           Littie Chiem, Pleas Koch, PA-C 01/26/18 2038    Benjiman Core, MD 01/27/18 [id number]

## 2018-01-26 NOTE — Discharge Instructions (Signed)
You are seen in the emergency department today after a fall.  Your imaging was all reassuring without any fractures or dislocations or head bleed.  We suspect that you are having some muscle soreness related to the injury.  We are sending you home with a prescription for Robaxin.  - Robaxin is the muscle relaxer I have prescribed, this is meant to help with muscle tightness. Be aware that this medication may make you drowsy therefore the first time you take this it should be at a time you are in an environment where you can rest. Do not drive or operate heavy machinery when taking this medication. Do not drink alcohol or take other sedating medications with this medicine such as narcotics or benzodiazepines.   You make take Tylenol per over the counter dosing with these medications.   We have prescribed you new medication(s) today. Discuss the medications prescribed today with your pharmacist as they can have adverse effects and interactions with your other medicines including over the counter and prescribed medications. Seek medical evaluation if you start to experience new or abnormal symptoms after taking one of these medicines, seek care immediately if you start to experience difficulty breathing, feeling of your throat closing, facial swelling, or rash as these could be indications of a more serious allergic reaction  These follow-up with your primary care provider and/or your pain management provider for reevaluation within 1 week.  Return to the ER for new or worsening symptoms or any other concerns.

## 2018-01-26 NOTE — ED Notes (Signed)
Patient transported to CT 

## 2018-01-26 NOTE — ED Notes (Signed)
ED Provider at bedside. 

## 2018-01-26 NOTE — ED Triage Notes (Addendum)
Reports fall out of bed yesterday face first.  C/o facial pain and generalized body pain.  History of chronic back pain.  Denies LOC but states she does not remember fall.

## 2018-01-30 MED FILL — METHOCARBAMOL 500 MG TABLET: 500 | 10 days supply | Qty: 30 | Fill #0

## 2018-01-30 MED FILL — diazePAM 5 MG TABS: 5 | 17 days supply | Qty: 50 | Fill #1

## 2018-02-01 ENCOUNTER — Ambulatory Visit: Payer: BLUE CROSS/BLUE SHIELD | Admitting: Nurse Practitioner

## 2018-02-01 ENCOUNTER — Encounter: Payer: Self-pay | Admitting: Nurse Practitioner

## 2018-02-01 VITALS — BP 118/80 | HR 97 | Temp 98.9°F | Ht 63.0 in | Wt 255.0 lb

## 2018-02-01 DIAGNOSIS — M25561 Pain in right knee: Secondary | ICD-10-CM | POA: Diagnosis not present

## 2018-02-01 DIAGNOSIS — R600 Localized edema: Secondary | ICD-10-CM | POA: Diagnosis not present

## 2018-02-01 DIAGNOSIS — M25562 Pain in left knee: Secondary | ICD-10-CM

## 2018-02-01 DIAGNOSIS — R05 Cough: Secondary | ICD-10-CM

## 2018-02-01 DIAGNOSIS — G8929 Other chronic pain: Secondary | ICD-10-CM | POA: Insufficient documentation

## 2018-02-01 DIAGNOSIS — R059 Cough, unspecified: Secondary | ICD-10-CM

## 2018-02-01 DIAGNOSIS — W19XXXD Unspecified fall, subsequent encounter: Secondary | ICD-10-CM

## 2018-02-01 MED ORDER — BENZONATATE 100 MG PO CAPS: 100.0000 mg | ORAL_CAPSULE | Freq: Two times a day (BID) | ORAL | 0 refills | Status: DC | PRN

## 2018-02-01 MED FILL — BENZONATATE 100 MG CAP: 100 | 10 days supply | Qty: 20 | Fill #0

## 2018-02-01 NOTE — Patient Instructions (Addendum)
I have sent tessalon for your cough  Please follow up for fevers over 101, if your symptoms get worse, or if your symptoms dont get better in 1 week.  Please schedule a follow up appointment for further evaluation here with Dr Katrinka BlazingSmith or Dr Jordan LikesSchmitz, our sports medicine providers, for knee pain    Knee Pain, Adult Many things can cause knee pain. The pain often goes away on its own with time and rest. If the pain does not go away, tests may be done to find out what is causing the pain. Follow these instructions at home: Activity  Rest your knee.  Do not do things that cause pain.  Avoid activities where both feet leave the ground at the same time (high-impact activities). Examples are running, jumping rope, and doing jumping jacks. General instructions  Take medicines only as told by your doctor.  Raise (elevate) your knee when you are resting. Make sure your knee is higher than your heart.  Sleep with a pillow under your knee.  If told, put ice on the knee: ? Put ice in a plastic bag. ? Place a towel between your skin and the bag. ? Leave the ice on for 20 minutes, 2-3 times a day.  Ask your doctor if you should wear an elastic knee support.  Lose weight if you are overweight. Being overweight can make your knee hurt more.  Do not use any tobacco products. These include cigarettes, chewing tobacco, or electronic cigarettes. If you need help quitting, ask your doctor. Smoking may slow down healing. Contact a doctor if:  The pain does not stop.  The pain changes or gets worse.  You have a fever along with knee pain.  Your knee gives out or locks up.  Your knee swells, and becomes worse. Get help right away if:  Your knee feels warm.  You cannot move your knee.  You have very bad knee pain.  You have chest pain.  You have trouble breathing. Summary  Many things can cause knee pain. The pain often goes away on its own with time and rest.  Avoid activities that  put stress on your knee. These include running and jumping rope.  Get help right away if you cannot move your knee, or if your knee feels warm, or if you have trouble breathing. This information is not intended to replace advice given to you by your health care provider. Make sure you discuss any questions you have with your health care provider. Document Released: 05/14/2008 Document Revised: 02/10/2016 Document Reviewed: 02/10/2016 Elsevier Interactive Patient Education  2017 [employer]

## 2018-02-01 NOTE — Assessment & Plan Note (Signed)
followed by pain clinic

## 2018-02-01 NOTE — Assessment & Plan Note (Addendum)
Could be d/t venous insufficiency Vas US done 11/16/17 I ordered CT abd/pelvis for further evaluation of this on 11/28/17 but she did not want to have this test at the time so it was cancelled Cardiology evaluation was done 12/21/17 I will place referral to vasc surgery today for further evaluation, shes was also instructed to follow up with orthopedics regarding referral that was to be done by ortho provider Home management-elevation, compression tights, red flags and return precautions including when to seek immediate care discussed and printed on AVS - Ambulatory referral to Vascular Surgery

## 2018-02-01 NOTE — Progress Notes (Signed)
Carly Delacruz is a 52 y.o. female with the following history as recorded in Epic[employer]:  Patient Active Problem List   Diagnosis Date Noted  . Atypical chest pain 12/21/2017  . Lower extremity edema 12/21/2017  . Encounter for general adult medical examination with abnormal findings 08/04/2017  . Depression 05/08/2017  . Glaucoma 05/02/2017  . Chronic back pain 08/12/2016  . Anemia 08/12/2016  . H/O bladder infections 08/12/2016  . S/P gastric bypass 08/12/2016  . Polypharmacy   . Sleep-disordered breathing 07/26/2014  . GERD (gastroesophageal reflux disease) 06/15/2014  . Neuropathy 06/15/2014  . Essential hypertension 06/15/2014  . Obesity 05/08/2013  . Morbid obesity (HCC) 03/02/2003    Current Outpatient Medications  Medication Sig Dispense Refill  . amitriptyline (ELAVIL) 150 MG tablet Take 1 tablet (150 mg total) by mouth at bedtime. 90 tablet 0  . carvedilol (COREG) 3.125 MG tablet Take 1 tablet (3.125 mg total) by mouth 2 (two) times daily. 180 tablet 1  . diazepam (VALIUM) 5 MG tablet Place 5 mg vaginally at bedtime.     . famotidine (PEPCID) 20 MG tablet Take 1 tablet (20 mg total) by mouth 2 (two) times daily. 60 tablet 3  . FeFum-FePoly-FA-B Cmp-C-Biot (FOLIVANE-PLUS) CAPS Take 1 capsule by mouth daily.     Marland Kitchen FLUoxetine (PROZAC) 40 MG capsule Take 1 capsule (40 mg total) by mouth daily. 90 capsule 0  . furosemide (LASIX) 20 MG tablet Take 1 tablet (20 mg total) by mouth daily as needed for fluid or edema. 30 tablet 1  . gabapentin (NEURONTIN) 300 MG capsule Take 1 capsule (300 mg total) by mouth 2 (two) times daily.    . hydrOXYzine (VISTARIL) 25 MG capsule Take 25 mg by mouth as needed.  2  . methocarbamol (ROBAXIN) 500 MG tablet Take 1 tablet (500 mg total) by mouth every 8 (eight) hours as needed. 30 tablet 0  . tapentadol (NUCYNTA) 50 MG tablet Take 1 tablet (50 mg total) by mouth every 12 (twelve) hours as needed for moderate pain. (Patient taking differently:  Take 50 mg by mouth 3 (three) times daily. )    . tiZANidine (ZANAFLEX) 4 MG tablet Take 4 mg by mouth 2 (two) times daily.     . Zinc 50 MG CAPS Take 50 mg by mouth.    . benzonatate (TESSALON) 100 MG capsule Take 1 capsule (100 mg total) by mouth 2 (two) times daily as needed for cough. 20 capsule 0   No current facility-administered medications for this visit.     Allergies: Morphine and related; Septra [sulfamethoxazole-trimethoprim]; Sulfa antibiotics; Sulfamethoxazole-trimethoprim; Sulfasalazine; Ultram [tramadol]; Ciprofloxacin; Darvocet [propoxyphene n-acetaminophen]; Adhesive [tape]; Codeine; Demerol [meperidine]; Hydromorphone; and Losartan  Past Medical History:  Diagnosis Date  . Allergy   . Anemia   . Anxiety   . Arthritis 12/10/03  . Atrophic vaginitis 2009  . BV (bacterial vaginosis) 2006  . Chronic back pain   . Chronic LLQ pain   . Chronic post-traumatic stress disorder (PTSD)   . Complication of anesthesia   . Elevated LFTs   . Endometriosis   . Fatigue 2012  . Gallstones   . GERD (gastroesophageal reflux disease)   . H/O bladder infections   . H/O mumps   . H/O varicella   . History of candidal vulvovaginitis 03/21/03  . HTN (hypertension)   . Insomnia   . Insomnia secondary to situational depression 06/06/07  . Liver mass, right lobe 08/13/04   Nodule  . Malaise 2012  .  Morbid obesity (HCC) 2005  . Osteoporosis   . S/P gastric bypass   . Severe episode of recurrent major depressive disorder, without psychotic features (HCC)   . Sleep apnea 2005  . SUI (stress urinary incontinence, female) 08/12/03  . Trichomonas   . Vulvar lesion 03/21/03  . Yeast infection     Past Surgical History:  Procedure Laterality Date  . ABDOMINAL HYSTERECTOMY  2001  . APPENDECTOMY  2001  . CHOLECYSTECTOMY  2011  . DILATION AND CURETTAGE OF UTERUS    . GASTRIC BYPASS  2007   [facility], Dr. Daphine DeutscherMartin  . KNEE ARTHROSCOPY Left   . LAPAROTOMY    . TONSILLECTOMY     . WISDOM TOOTH EXTRACTION      Family History  Problem Relation Age of Onset  . Diabetes Paternal Grandmother   . Leukemia Maternal Grandmother   . Arthritis Mother   . Prostate cancer Father   . Colitis Sister   . Hypertension Sister   . Deep vein thrombosis Sister   . Kidney disease Brother   . Obstructive Sleep Apnea Brother     Social History   Tobacco Use  . Smoking status: Never Smoker  . Smokeless tobacco: Never Used  Substance Use Topics  . Alcohol use: No     Subjective:  Carly Delacruz is here today for ED follow up visit, accompanied by her mother to appt today. She was seen in ED for a fall on 01/26/18, was sleeping in her bed and when she woke and rolled over out of the bed onto the floor, unsure of LOC, then began to have general body aches shortly after the fall so she decided to go to ED For evaluation. ED imaging including xrays of T&L spine, right shoulder, right hip, right knee and CT head and c-spine were all unremarkable for acute abnormalities and she was discharged home with robaxin prn Rx. She tells me today that she continues to have bilateral knee pain since the fall, also her left foot is still painful and swollen, we have discussed this at past visits and she actually saw ortho for this, says she was referred to vascular by ortho but never heard about the referral.  She has been taking nucynta (by pain clinic), robaxin, gabapentin for her knee and foot pain at home with minimal relief She is also requesting tessalon for a cough today, began coughing yesterday, thick sputum, otherwise feels okay, had tessalon for a cough in the past and it worked well for her. She denies further falls, she denies syncope, fevers, cp, shortness of breath.   ROS- See HPI  Objective:  Vitals:   02/01/18 1300  BP: 118/80  Pulse: 97  Temp: 98.9 F (37.2 C)  TempSrc: Oral  SpO2: 96%  Weight: 255 lb (115.7 kg)  Height: 5\' 3"  (1.6 m)    General: Well developed, well  nourished, in no acute distress  Skin : Warm and dry.  Head: Normocephalic and atraumatic  Eyes: Sclera and conjunctiva clear; pupils round and reactive to light; extraocular movements intact  Oropharynx: Pink, supple. No suspicious lesions  Neck: Supple Lungs: Respirations unlabored; clear to auscultation bilaterally without wheeze, rales, rhonchi  CVS exam: normal rate and regular rhythm, S1 and S2 normal.  Musculoskeletal: No gross deformities. Extremities: No cyanosis. Scant edema to BLE Vessels: Symmetric bilaterally  Neurologic: Alert and oriented; speech intact; face symmetrical; moves all extremities well; CNII-XII intact without focal deficit  Psychiatric: Normal mood and affect.  Assessment:  1. Fall, subsequent encounter   2. Leg edema   3. Cough   4. Acute pain of both knees   5. Other chronic pain     Plan:   Return in about 3 months (around 05/03/2018) for routine follow up.  Orders Placed This Encounter  Procedures  . Ambulatory referral to Vascular Surgery    Referral Priority:   Routine    Referral Type:   Surgical    Referral Reason:   Specialty Services Required    Requested Specialty:   Vascular Surgery    Number of Visits Requested:   1    Requested Prescriptions   Signed Prescriptions Disp Refills  . benzonatate (TESSALON) 100 MG capsule 20 capsule 0    Sig: Take 1 capsule (100 mg total) by mouth 2 (two) times daily as needed for cough.    Cough Tessalon rx sent-dosing, side effects discussed Home management, red flags and return precautions including when to seek immediate care discussed and printed on AVS - benzonatate (TESSALON) 100 MG capsule; Take 1 capsule (100 mg total) by mouth 2 (two) times daily as needed for cough.  Dispense: 20 capsule; Refill: 0  Fall, subsequent encounter No further workup today Saw neuroloy for falls on 11/22/17 with no further workup ordered and suspected that  falls were related to chr back pain, meds We have  discussed fall precautions  Acute pain of both knees Recent/past knee imaging with degenerative changes Discussed Referral to sports med for further evaluation and to consider additional treatments for her knee pain, she is agreeable but says she will call back to schedule this appointment

## 2018-02-02 ENCOUNTER — Encounter (HOSPITAL_COMMUNITY): Payer: Self-pay | Admitting: Licensed Clinical Social Worker

## 2018-02-02 ENCOUNTER — Ambulatory Visit (INDEPENDENT_AMBULATORY_CARE_PROVIDER_SITE_OTHER): Payer: BLUE CROSS/BLUE SHIELD | Admitting: Licensed Clinical Social Worker

## 2018-02-02 ENCOUNTER — Encounter: Payer: Self-pay | Admitting: Nurse Practitioner

## 2018-02-02 DIAGNOSIS — F332 Major depressive disorder, recurrent severe without psychotic features: Secondary | ICD-10-CM | POA: Diagnosis not present

## 2018-02-02 MED FILL — tiZANidine HCL 4 MG TABS: 4 | 30 days supply | Qty: 90 | Fill #0
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
## 2018-02-09 ENCOUNTER — Ambulatory Visit (HOSPITAL_COMMUNITY): Payer: BLUE CROSS/BLUE SHIELD | Admitting: Licensed Clinical Social Worker

## 2018-02-13 ENCOUNTER — Telehealth: Payer: Self-pay | Admitting: Nurse Practitioner

## 2018-02-13 DIAGNOSIS — M461 Sacroiliitis, not elsewhere classified: Secondary | ICD-10-CM | POA: Diagnosis not present

## 2018-02-13 DIAGNOSIS — G894 Chronic pain syndrome: Secondary | ICD-10-CM | POA: Diagnosis not present

## 2018-02-13 DIAGNOSIS — G4700 Insomnia, unspecified: Secondary | ICD-10-CM | POA: Diagnosis not present

## 2018-02-13 DIAGNOSIS — M47816 Spondylosis without myelopathy or radiculopathy, lumbar region: Secondary | ICD-10-CM | POA: Diagnosis not present

## 2018-02-13 NOTE — Telephone Encounter (Signed)
Left detailed message informing pt of below.  

## 2018-02-13 NOTE — Telephone Encounter (Signed)
I have placed vascular referral for her, she can go ahead and call and schedule an appointment with them now:  [facility]  Address: [address], [place], [place] [address]  Phone: [phone]

## 2018-02-13 NOTE — Telephone Encounter (Signed)
Copied from CRM [phone]. Topic: Quick Communication - See Telephone Encounter >> Feb 13, 2018  8:40 AM Jens SomMedley, Jennifer A wrote: CRM for notification. See Telephone encounter for: 02/13/18.  Patient is calling to find out information for her referral.  And want to know the correct doctors that Apolonio Schneidersshleigh would like her to go.  The patient was advise by  Dr. Willa RoughMcKennely her to call Morrie Sheldonshley on what to do next?  Please advise [phone]

## 2018-02-14 ENCOUNTER — Telehealth: Payer: Self-pay | Admitting: Cardiovascular Disease

## 2018-02-14 ENCOUNTER — Other Ambulatory Visit: Payer: Self-pay | Admitting: Nurse Practitioner

## 2018-02-14 DIAGNOSIS — K219 Gastro-esophageal reflux disease without esophagitis: Secondary | ICD-10-CM

## 2018-02-14 DIAGNOSIS — I10 Essential (primary) hypertension: Secondary | ICD-10-CM

## 2018-02-14 MED FILL — diazePAM 5 MG TABS: 5 | 17 days supply | Qty: 50 | Fill #2

## 2018-02-14 MED FILL — FAMOTIDINE 20 MG TABLET: 20 | 30 days supply | Qty: 60 | Fill #0

## 2018-02-14 NOTE — Telephone Encounter (Signed)
Spoke with patient who states that her pressure have been as below. She is unsure what her HR was as these measurements are from provider office.   She has 2 cuffs at home and her home cuffs are reporting much lower numbers - though her report is actually close to the in office measurements 157/100. She also reports ears feel as though going to explode.   Discussed options and will increase carvedilol to 6.25mg  BID until appt on 12/24.

## 2018-02-14 NOTE — Telephone Encounter (Signed)
New Message:     Pt said she was scheduled for a procedure yesterday and it had to be cancelled., because her blood pressure was so high. Pt wants to know if she needs to come in for an office visit or what does she need to do? She also wants to know if she receive instructions for her CT [facility]. She says she has a white pill for this test, but does not know when she is supposed to take it.

## 2018-02-14 NOTE — Telephone Encounter (Signed)
Spoke to patient she had several issues 1- yesterday  Patient was unable to have epidural injection - due to e;evated blood pressure -154/120, 154/110, 154/102  They informed patient to contact primary , but patient states she called office because Dr Allyson SabalBerry is managing her blood pressure. Last night blood pressure per patient was 148/102 and today 160/100. Patient is aware will defer to [facility]-since LAST APPT was concerning blood pressure. Patient verbalized understanding.  2- patient states she has been falling lately and went ER -- elevated D-Dimer, an visual issues- informed patient to contact primary regarding this issue she verbalized understanding.  3- patient had question concerning when to take metoprolol 100 mg prior to CCTA on Monday 02/20/18. Patient is taking coreg 3.125 mg. Spoke with RN, who schedule- patient to take metoprolol 100 mg 2 hours prior to going to hospital. Verbalized understanding.  Patient aware will call back regarding blood pressure?

## 2018-02-14 NOTE — Telephone Encounter (Signed)
It looks like Kcl was d/c'd. Ok to Rf?

## 2018-02-15 ENCOUNTER — Other Ambulatory Visit: Payer: Self-pay

## 2018-02-15 DIAGNOSIS — R600 Localized edema: Secondary | ICD-10-CM

## 2018-02-17 DIAGNOSIS — F332 Major depressive disorder, recurrent severe without psychotic features: Secondary | ICD-10-CM | POA: Diagnosis not present

## 2018-02-17 MED FILL — QUETIAPINE 25 MG TABLET: 25 | 45 days supply | Qty: 90 | Fill #0

## 2018-02-17 MED FILL — FLUoxetine HCL 40 MG CAPS: 40 | 90 days supply | Qty: 90 | Fill #0

## 2018-02-17 MED FILL — HYDROXYZINE PAM 25 MG CAP: 25 | 90 days supply | Qty: 270 | Fill #0

## 2018-02-18 MED FILL — AMITRIPTYLINE HCL 150 MG TA: 150 | 90 days supply | Qty: 90 | Fill #0

## 2018-02-20 ENCOUNTER — Ambulatory Visit (HOSPITAL_COMMUNITY): Admission: RE | Admit: 2018-02-20 | Payer: BLUE CROSS/BLUE SHIELD | Source: Ambulatory Visit

## 2018-02-20 ENCOUNTER — Ambulatory Visit (HOSPITAL_COMMUNITY): Payer: BLUE CROSS/BLUE SHIELD

## 2018-02-20 MED FILL — POTASSIUM CHL ER M10 TABLET: 10 | 30 days supply | Qty: 60 | Fill #0

## 2018-02-20 NOTE — Telephone Encounter (Signed)
Refill sent.

## 2018-02-21 ENCOUNTER — Ambulatory Visit (INDEPENDENT_AMBULATORY_CARE_PROVIDER_SITE_OTHER): Payer: BLUE CROSS/BLUE SHIELD | Admitting: Pharmacist

## 2018-02-21 VITALS — BP 150/90 | HR 88 | Resp 16

## 2018-02-21 DIAGNOSIS — I10 Essential (primary) hypertension: Secondary | ICD-10-CM

## 2018-02-21 MED ORDER — CARVEDILOL 6.25 MG PO TABS: 6.2500 mg | ORAL_TABLET | Freq: Two times a day (BID) | ORAL | 1 refills | Status: DC

## 2018-02-21 MED ORDER — LISINOPRIL 5 MG PO TABS: 5.0000 mg | ORAL_TABLET | Freq: Every day | ORAL | 1 refills | Status: DC

## 2018-02-21 MED FILL — CARVEDILOL 6.25 MG TABLET: 6.25 | 90 days supply | Qty: 180 | Fill #0

## 2018-02-21 MED FILL — GABAPENTIN 300 MG CAPSULE: 300 | 30 days supply | Qty: 60 | Fill #0

## 2018-02-21 MED FILL — LISINOPRIL 5 MG TABLET: 5 | 30 days supply | Qty: 30 | Fill #0

## 2018-02-21 MED FILL — NUCYNTA 50 MG TABS: 50 | 30 days supply | Qty: 90 | Fill #0

## 2018-02-21 NOTE — Progress Notes (Signed)
Patient ID: Carly Delacruz                 DOB: 01/08/1966                      MRN: [id number]     HPI: Carly Delacruz is a 52 y.o. female referred by Dr. Allyson SabalBerry to HTN clinic. PMH includes uncontrolled hypertension, OSA not on CPAP after weight loss, gastric bypass, atypical chest pain, and lower extremity edema. Patient expressed some issues communicating due to mental health problems . "Some times has problems expressing and takes her longer to get works out". Denies problems with dizziness, increased swelling, palpitations, blurry vision or increase fatigue. Reports resolution of lower extremity edema, and recent increase in HA. She stopped taking furosemide all together ,but understand she can take it for increase swelling. Patient was also educated by her mental health clinic on the possibility of  amitriptyline and gabapentin  worsening in BP and fluid retention; therefore, she understand the need to adjust BP medication frequently until appropriate response.   Current HTN meds:  Carvedilol 6.25mg  twice daily - 1st dose this morning Furosemide 20mg  daily as needed for edema  Previously tried:  Losartan - palpitation and elevated potassium Amlodipine - edema  BP goal: 130/80  Family History: hypertension, DVT ,and colitis in sister; OSA and kidney disease in brother  Social History: denies tobacco or alcohol use  Diet: balanced low sodium diet  Exercise: activities of daily living (ambulates with walker and had chronic leg pain)  Home BP readings: none provided today  Wt Readings from Last 3 Encounters:  02/01/18 255 lb (115.7 kg)  01/26/18 267 lb (121.1 kg)  01/04/18 240 lb (108.9 kg)   BP Readings from Last 3 Encounters:  02/21/18 (!) 150/90  02/01/18 118/80  01/26/18 139/84   Pulse Readings from Last 3 Encounters:  02/21/18 88  02/01/18 97  01/26/18 88    Past Medical History:  Diagnosis Date  . Allergy   . Anemia   . Anxiety   . Arthritis 12/10/03  .  Atrophic vaginitis 2009  . BV (bacterial vaginosis) 2006  . Chronic back pain   . Chronic LLQ pain   . Chronic post-traumatic stress disorder (PTSD)   . Complication of anesthesia   . Elevated LFTs   . Endometriosis   . Fatigue 2012  . Gallstones   . GERD (gastroesophageal reflux disease)   . H/O bladder infections   . H/O mumps   . H/O varicella   . History of candidal vulvovaginitis 03/21/03  . HTN (hypertension)   . Insomnia   . Insomnia secondary to situational depression 06/06/07  . Liver mass, right lobe 08/13/04   Nodule  . Malaise 2012  . Morbid obesity (HCC) 2005  . Osteoporosis   . S/P gastric bypass   . Severe episode of recurrent major depressive disorder, without psychotic features (HCC)   . Sleep apnea 2005  . SUI (stress urinary incontinence, female) 08/12/03  . Trichomonas   . Vulvar lesion 03/21/03  . Yeast infection     Current Outpatient Medications on File Prior to Visit  Medication Sig Dispense Refill  . amitriptyline (ELAVIL) 150 MG tablet Take 1 tablet (150 mg total) by mouth at bedtime. 90 tablet 0  . diazepam (VALIUM) 5 MG tablet Place 5 mg vaginally at bedtime.     . famotidine (PEPCID) 20 MG tablet TAKE 1 TABLET BY MOUTH TWICE DAILY 60 tablet  3  . FeFum-FePoly-FA-B Cmp-C-Biot (FOLIVANE-PLUS) CAPS Take 1 capsule by mouth daily.     Marland Kitchen. FLUoxetine (PROZAC) 40 MG capsule Take 1 capsule (40 mg total) by mouth daily. 90 capsule 0  . furosemide (LASIX) 20 MG tablet Take 1 tablet (20 mg total) by mouth daily as needed for fluid or edema. 30 tablet 1  . gabapentin (NEURONTIN) 300 MG capsule Take 1 capsule (300 mg total) by mouth 2 (two) times daily.    . hydrOXYzine (VISTARIL) 25 MG capsule Take 25 mg by mouth as needed.  2  . potassium chloride (K-DUR,KLOR-CON) 10 MEQ tablet Take 1 tablet (10 mEq total) by mouth daily. As directed with lasix 60 tablet 1  . QUEtiapine (SEROQUEL) 25 MG tablet Take 25 mg by mouth at bedtime.    . tapentadol (NUCYNTA) 50 MG tablet  Take 1 tablet (50 mg total) by mouth every 12 (twelve) hours as needed for moderate pain. (Patient taking differently: Take 50 mg by mouth 3 (three) times daily. )    . tiZANidine (ZANAFLEX) 4 MG tablet Take 4 mg by mouth 2 (two) times daily.     . Zinc 50 MG CAPS Take 50 mg by mouth.    . benzonatate (TESSALON) 100 MG capsule Take 1 capsule (100 mg total) by mouth 2 (two) times daily as needed for cough. (Patient not taking: Reported on 02/21/2018) 20 capsule 0  . methocarbamol (ROBAXIN) 500 MG tablet Take 1 tablet (500 mg total) by mouth every 8 (eight) hours as needed. (Patient not taking: Reported on 02/21/2018) 30 tablet 0   No current facility-administered medications on file prior to visit.     Allergies  Allergen Reactions  . Morphine And Related Shortness Of Breath and Itching  . Septra [Sulfamethoxazole-Trimethoprim] Anaphylaxis and Hives  . Sulfa Antibiotics Anaphylaxis, Hives and Swelling    Throat swelling  . Sulfamethoxazole-Trimethoprim Anaphylaxis and Hives  . Sulfasalazine Anaphylaxis, Hives, Swelling and Other (See Comments)    Throat swelling  . Ultram [Tramadol] Shortness Of Breath, Anxiety and Other (See Comments)    "panic attacks"   . Ciprofloxacin Other (See Comments)    This medication was given along with a sulfa. Reaction is unknown at this time. Must list due to possible allergy.  Leodis [place]. Darvocet [Propoxyphene N-Acetaminophen] Itching  . Adhesive [Tape] Rash  . Codeine Itching and Other (See Comments)    No change when benadryl is given  . Demerol [Meperidine] Nausea Only  . Hydromorphone Itching and Other (See Comments)    No change when benadryl is given  . Losartan Palpitations    Blood pressure (!) 150/90, pulse 88, resp. rate 16.  Essential hypertension BP remains above goal and increased since patient stopped taking furosemide. Her BP may be responding to amitriptyline and gabapentin dose adjustments as well. Will continue carvedilol at 6.25mg  twice  daily and add Lisinopril 5mg  daily to her therapy. Furosemide 20mg  will continue needed for fluid retention/edema. Patient instructed to repeat BMET in 1 week and f/u in clinic in 4 weeks. Plan to increase Lisinopril dose to 10mg  daily if patient able to tolerate and additional BP control needed.   Kaston Faughn Rodriguez-Guzman PharmD, BCPS, CPP [facility] [address] [place], [phone] 02/28/2018 10:08 AM

## 2018-02-21 NOTE — Patient Instructions (Addendum)
Return for a follow up appointment in 4 weeks  Go to the lab in 1 week  Check your blood pressure at home daily (if able) and keep record of the readings.  Take your BP meds as follows: START TAKING LISINOPRIL 5MG  DAILY* CONTINUE TAKING CARVEDILOL 6.25MG  TWICE DAILY CONTINUE ALL OTHER MEDICATION AS PREVIOUSLY PRESCRIBED  Bring all of your meds, your BP cuff and your record of home blood pressures to your next appointment.  Exercise as you're able, try to walk approximately 30 minutes per day.  Keep salt intake to a minimum, especially watch canned and prepared boxed foods.  Eat more fresh fruits and vegetables and fewer canned items.  Avoid eating in fast food restaurants.    HOW TO TAKE YOUR BLOOD PRESSURE: . Rest 5 minutes before taking your blood pressure. .  Don't smoke or drink caffeinated beverages for at least 30 minutes before. . Take your blood pressure before (not after) you eat. . Sit comfortably with your back supported and both feet on the floor (don't cross your legs). . Elevate your arm to heart level on a table or a desk. . Use the proper sized cuff. It should fit smoothly and snugly around your bare upper arm. There should be enough room to slip a fingertip under the cuff. The bottom edge of the cuff should be 1 inch above the crease of the elbow. . Ideally, take 3 measurements at one sitting and record the average.

## 2018-02-28 MED FILL — tiZANidine HCL 4 MG TABS: 4 | 90 days supply | Qty: 270 | Fill #0

## 2018-02-28 NOTE — Assessment & Plan Note (Addendum)
BP remains above goal and increased since patient stopped taking furosemide. Her BP may be responding to amitriptyline and gabapentin dose adjustments as well. Will continue carvedilol at 6.25mg  twice daily and add Lisinopril 5mg  daily to her therapy. Furosemide 20mg  will continue needed for fluid retention/edema. Patient instructed to repeat BMET in 1 week and f/u in clinic in 4 weeks. Plan to increase Lisinopril dose to 10mg  daily if patient able to tolerate and additional BP control needed.

## 2018-03-14 ENCOUNTER — Ambulatory Visit (HOSPITAL_COMMUNITY): Payer: BLUE CROSS/BLUE SHIELD | Admitting: Licensed Clinical Social Worker

## 2018-03-15 ENCOUNTER — Ambulatory Visit (HOSPITAL_COMMUNITY): Payer: BLUE CROSS/BLUE SHIELD | Admitting: Licensed Clinical Social Worker

## 2018-03-17 ENCOUNTER — Telehealth (HOSPITAL_COMMUNITY): Payer: Self-pay | Admitting: Emergency Medicine

## 2018-03-17 NOTE — Telephone Encounter (Signed)
Left message on voicemail with name and callback number Damontay Alred RN Navigator Cardiac Imaging [facility] [phone] Office [phone] Cell  

## 2018-03-20 ENCOUNTER — Telehealth (HOSPITAL_COMMUNITY): Payer: Self-pay | Admitting: Emergency Medicine

## 2018-03-20 ENCOUNTER — Telehealth: Payer: Self-pay | Admitting: Cardiology

## 2018-03-20 ENCOUNTER — Telehealth: Payer: Self-pay | Admitting: Cardiovascular Disease

## 2018-03-20 ENCOUNTER — Other Ambulatory Visit: Payer: Self-pay

## 2018-03-20 DIAGNOSIS — Z01812 Encounter for preprocedural laboratory examination: Secondary | ICD-10-CM | POA: Diagnosis not present

## 2018-03-20 MED ORDER — METOPROLOL TARTRATE 100 MG PO TABS: 100.0000 mg | ORAL_TABLET | Freq: Once | ORAL | 0 refills | Status: DC

## 2018-03-20 MED ORDER — METOPROLOL TARTRATE 100 MG PO TABS: 100.0000 mg | ORAL_TABLET | Freq: Once | ORAL | 3 refills | Status: DC

## 2018-03-20 MED FILL — METOPROLOL TARTRATE 100 MG: 100 | 1 days supply | Qty: 1 | Fill #0

## 2018-03-20 MED FILL — LISINOPRIL 5 MG TABLET: 5 | 30 days supply | Qty: 30 | Fill #1

## 2018-03-20 NOTE — Telephone Encounter (Signed)
LMTCB

## 2018-03-20 NOTE — Telephone Encounter (Signed)
  Patient is scheduled for CT on 03/21/18 and that she needs some metoprolol to take for the CT.  Can Dr Allyson Sabal please call in a script to [facility]?

## 2018-03-20 NOTE — Telephone Encounter (Signed)
New message      Pt c/o medication issue:  1. Name of Medication:   metoprolol tartrate (LOPRESSOR) 100 MG tablet     2. How are you currently taking this medication (dosage and times per day)? Take 1 tablet (100 mg total) by mouth once for 1 dose. Take 1 tablet (100 mg) two hours before your procedure  3. Are you having a reaction (difficulty breathing--STAT)? No   4. What is your medication issue? Monica from [facility] long stated that she think how often pt should take medication is incorrect . Please follow up

## 2018-03-20 NOTE — Telephone Encounter (Signed)
Returning patients phone call--pt verbalizes understanding of appt date/time, parking situation and where to check in, pre-test NPO status and medications ordered, and verified current allergies; name and call back number provided for further questions should they arise Rockwell AlexandriaSara Shemaiah Round RN Navigator Cardiac Imaging [facility] [phone] office [phone] cell  Pt states she took metoprolol last time she had coronary CT scheduled, but had to reschedule. Now needs new prescription for metoprolol   Also has labs to be drawn today

## 2018-03-20 NOTE — Telephone Encounter (Signed)
Spoke with patient regarding medication instructions for upcoming coronary CTA. Advised pt that, per Dr. Allyson Sabal, pt to hold her carvedilol today and tomorrow and take the one-time dose of metoprolol 100mg  2 hours before procedure tomorrow. Pt stated that she took her first dose of her BID carvedilol 6.25mg  this AM. Advised pt not to take evening dose today.   Pt did not realize that she is supposed to be taking lisinopril 5 mg daily. Advised pt to report to her preferred pharmacy to pick up medication. Also advised pt that, per pharmD, she is to start taking her daily lisinopril 5 mg on 03/21/2018 after her procedure.   Informed pt that starting 03/22/2018, she is to take carvedilol 6.25 mg BID as prescribed and lisinopril 5 mg daily as prescribed. Pt verbalized understanding of medication instructions

## 2018-03-21 ENCOUNTER — Ambulatory Visit (HOSPITAL_COMMUNITY)
Admission: RE | Admit: 2018-03-21 | Discharge: 2018-03-21 | Disposition: A | Discharge status: Home / Self Care | Payer: BLUE CROSS/BLUE SHIELD | Source: Ambulatory Visit | Attending: Cardiovascular Disease | Admitting: Cardiovascular Disease

## 2018-03-21 ENCOUNTER — Encounter: Payer: Self-pay | Admitting: *Deleted

## 2018-03-21 DIAGNOSIS — I10 Essential (primary) hypertension: Secondary | ICD-10-CM | POA: Insufficient documentation

## 2018-03-21 DIAGNOSIS — R0789 Other chest pain: Secondary | ICD-10-CM

## 2018-03-21 DIAGNOSIS — Z006 Encounter for examination for normal comparison and control in clinical research program: Secondary | ICD-10-CM

## 2018-03-21 LAB — BASIC METABOLIC PANEL
BUN/Creatinine Ratio: 10 (ref 9–23)
BUN: 10 mg/dL (ref 6–24)
CO2: 25 mmol/L (ref 20–29)
Calcium: 9 mg/dL (ref 8.7–10.2)
Chloride: 98 mmol/L (ref 96–106)
Creatinine, Ser: 1.04 mg/dL — ABNORMAL HIGH (ref 0.57–1.00)
GFR calc non Af Amer: 61 mL/min/{1.73_m2} (ref >59)
GFR, EST AFRICAN AMERICAN: 70 mL/min/{1.73_m2} (ref >59)
Glucose: 78 mg/dL (ref 65–99)
Potassium: 4.7 mmol/L (ref 3.5–5.2)
Sodium: 143 mmol/L (ref 134–144)

## 2018-03-21 LAB — CBC
Hematocrit: 38.7 % (ref 34.0–46.6)
Hemoglobin: 12.3 g/dL (ref 11.1–15.9)
MCH: 26 pg — ABNORMAL LOW (ref 26.6–33.0)
MCHC: 31.8 g/dL (ref 31.5–35.7)
MCV: 82 fL (ref 79–97)
Platelets: 363 10*3/uL (ref 150–450)
RBC: 4.73 x10E6/uL (ref 3.77–5.28)
RDW: 13.6 % (ref 11.7–15.4)
WBC: 5.3 10*3/uL (ref 3.4–10.8)

## 2018-03-21 MED ORDER — NITROGLYCERIN 0.4 MG SL SUBL
SUBLINGUAL_TABLET | SUBLINGUAL | Status: AC
  Filled 2018-03-21: qty 2

## 2018-03-21 MED ORDER — METOPROLOL TARTRATE 5 MG/5ML IV SOLN
INTRAVENOUS | Status: AC
  Filled 2018-03-21: qty 20

## 2018-03-21 MED ORDER — IOPAMIDOL (ISOVUE-370) INJECTION 76%
100.0000 mL | Freq: Once | INTRAVENOUS | Status: AC | PRN
  Administered 2018-03-21: 100 mL via INTRAVENOUS

## 2018-03-21 MED ORDER — NITROGLYCERIN 0.4 MG SL SUBL
0.8000 mg | SUBLINGUAL_TABLET | Freq: Once | SUBLINGUAL | Status: AC
  Administered 2018-03-21: 0.8 mg via SUBLINGUAL
  Filled 2018-03-21: qty 25

## 2018-03-21 MED ORDER — METOPROLOL TARTRATE 5 MG/5ML IV SOLN
5.0000 mg | INTRAVENOUS | Status: DC | PRN
  Administered 2018-03-21 (×4): 5 mg via INTRAVENOUS
  Filled 2018-03-21 (×5): qty 5

## 2018-03-21 NOTE — Progress Notes (Signed)
CT scan completed. Tolerated well. D/C home with mother. Awake and alert. In no distress.

## 2018-03-21 NOTE — Research (Signed)
Carly Delacruz met inclusion and exclusion criteria.  The informed consent form, study requirements and expectations were reviewed with the subject and questions and concerns were addressed prior to the signing of the consent form.  The subject verbalized understanding of the trial requirements.  The subject agreed to participate in the CADFEM trial and signed the informed consent.  The informed consent was obtained prior to performance of any protocol-specific procedures for the subject.  A copy of the signed informed consent was given to the subject and a copy was placed in the subject's medical record.   [name]. [employer]

## 2018-03-22 ENCOUNTER — Encounter (HOSPITAL_COMMUNITY): Payer: Self-pay | Admitting: Licensed Clinical Social Worker

## 2018-03-22 ENCOUNTER — Ambulatory Visit (INDEPENDENT_AMBULATORY_CARE_PROVIDER_SITE_OTHER): Payer: BLUE CROSS/BLUE SHIELD | Admitting: Licensed Clinical Social Worker

## 2018-03-22 DIAGNOSIS — F332 Major depressive disorder, recurrent severe without psychotic features: Secondary | ICD-10-CM | POA: Diagnosis not present
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
## 2018-03-23 MED FILL — NUCYNTA 50 MG TABS: 50 | 30 days supply | Qty: 90 | Fill #0

## 2018-03-23 MED FILL — GABAPENTIN 300 MG CAPSULE: 300 | 90 days supply | Qty: 270 | Fill #0

## 2018-03-28 ENCOUNTER — Ambulatory Visit: Payer: BLUE CROSS/BLUE SHIELD | Admitting: Cardiovascular Disease

## 2018-03-29 ENCOUNTER — Ambulatory Visit (INDEPENDENT_AMBULATORY_CARE_PROVIDER_SITE_OTHER): Payer: BLUE CROSS/BLUE SHIELD | Admitting: Licensed Clinical Social Worker

## 2018-03-29 DIAGNOSIS — F332 Major depressive disorder, recurrent severe without psychotic features: Secondary | ICD-10-CM | POA: Diagnosis not present

## 2018-03-29 MED FILL — FUROSEMIDE 20 MG TABS: 20 | 30 days supply | Qty: 30 | Fill #1

## 2018-03-30 ENCOUNTER — Ambulatory Visit (HOSPITAL_COMMUNITY)
Admission: RE | Admit: 2018-03-30 | Discharge: 2018-03-30 | Disposition: A | Discharge status: Home / Self Care | Payer: BLUE CROSS/BLUE SHIELD | Source: Ambulatory Visit | Attending: Vascular Surgery | Admitting: Vascular Surgery

## 2018-03-30 ENCOUNTER — Encounter (HOSPITAL_COMMUNITY): Payer: Self-pay | Admitting: Licensed Clinical Social Worker

## 2018-03-30 ENCOUNTER — Encounter: Payer: Self-pay | Admitting: Vascular Surgery

## 2018-03-30 ENCOUNTER — Ambulatory Visit (INDEPENDENT_AMBULATORY_CARE_PROVIDER_SITE_OTHER): Payer: BLUE CROSS/BLUE SHIELD | Admitting: Vascular Surgery

## 2018-03-30 ENCOUNTER — Other Ambulatory Visit: Payer: Self-pay

## 2018-03-30 VITALS — BP 153/98 | HR 90 | Temp 98.3°F | Resp 20 | Ht 63.0 in | Wt 255.0 lb

## 2018-03-30 DIAGNOSIS — I87303 Chronic venous hypertension (idiopathic) without complications of bilateral lower extremity: Secondary | ICD-10-CM | POA: Diagnosis not present

## 2018-03-30 DIAGNOSIS — R600 Localized edema: Secondary | ICD-10-CM

## 2018-03-30 NOTE — Progress Notes (Signed)
Referring Physician: Alphonse Guild, NP  Patient name: Carly Delacruz MRN: [id number] DOB: 1965-12-31 Sex: female  REASON FOR CONSULT: Leg swelling  HPI: Carly Delacruz is a 53 y.o. female, with a several year history of chronic leg swelling.  She has tried Lasix which did not really help.  She has used some light weight compression which does seem to help somewhat.  She denies prior history of DVT.  He also describes a burning and stinging sensation in her feet.  She developed swelling at fairly random occasions.  She states that her legs are sometimes swollen before she even gets out of bed in the morning.  However, she does not recall any precipitating events.  She is not really very mobile or active.  She has severe balance issues and walks with a walker.  Other medical problems include chronic back pain, obesity, depression all of which are currently stable.  Past Medical History:  Diagnosis Date  . Allergy   . Anemia   . Anxiety   . Arthritis 12/10/03  . Atrophic vaginitis 2009  . BV (bacterial vaginosis) 2006  . Chronic back pain   . Chronic LLQ pain   . Chronic post-traumatic stress disorder (PTSD)   . Complication of anesthesia   . Elevated LFTs   . Endometriosis   . Fatigue 2012  . Gallstones   . GERD (gastroesophageal reflux disease)   . H/O bladder infections   . H/O mumps   . H/O varicella   . History of candidal vulvovaginitis 03/21/03  . HTN (hypertension)   . Insomnia   . Insomnia secondary to situational depression 06/06/07  . Liver mass, right lobe 08/13/04   Nodule  . Malaise 2012  . Morbid obesity (HCC) 2005  . Osteoporosis   . S/P gastric bypass   . Severe episode of recurrent major depressive disorder, without psychotic features (HCC)   . Sleep apnea 2005  . SUI (stress urinary incontinence, female) 08/12/03  . Trichomonas   . Vulvar lesion 03/21/03  . Yeast infection    Past Surgical History:  Procedure Laterality Date  . ABDOMINAL  HYSTERECTOMY  2001  . APPENDECTOMY  2001  . CHOLECYSTECTOMY  2011  . DILATION AND CURETTAGE OF UTERUS    . GASTRIC BYPASS  2007   [facility], Dr. Daphine Deutscher  . KNEE ARTHROSCOPY Left   . LAPAROTOMY    . TONSILLECTOMY    . WISDOM TOOTH EXTRACTION      Family History  Problem Relation Age of Onset  . Diabetes Paternal Grandmother   . Leukemia Maternal Grandmother   . Arthritis Mother   . Prostate cancer Father   . Colitis Sister   . Hypertension Sister   . Deep vein thrombosis Sister   . Kidney disease Brother   . Obstructive Sleep Apnea Brother     SOCIAL HISTORY: Social History   Socioeconomic History  . Marital status: Single    Spouse name: Not on file  . Number of children: 0  . Years of education: Not on file  . Highest education level: Bachelor's degree (e.g., BA, AB, BS)  Occupational History  . Occupation: DISABILITY  Social Needs  . Financial resource strain: Not on file  . Food insecurity:    Worry: Not on file    Inability: Not on file  . Transportation needs:    Medical: Not on file    Non-medical: Not on file  Tobacco Use  . Smoking status: Never  Smoker  . Smokeless tobacco: Never Used  Substance and Sexual Activity  . Alcohol use: No  . Drug use: No  . Sexual activity: Never    Birth control/protection: Surgical    Comment: hysterectomy   Lifestyle  . Physical activity:    Days per week: Not on file    Minutes per session: Not on file  . Stress: Not on file  Relationships  . Social connections:    Talks on phone: Not on file    Gets together: Not on file    Attends religious service: Not on file    Active member of club or organization: Not on file    Attends meetings of clubs or organizations: Not on file    Relationship status: Not on file  . Intimate partner violence:    Fear of current or ex partner: Not on file    Emotionally abused: Not on file    Physically abused: Not on file    Forced sexual activity: Not on file    Other Topics Concern  . Not on file  Social History Narrative   Patient is [place]. Her mother is living with her in a one level house, 2 steps to enter.     Allergies  Allergen Reactions  . Morphine And Related Shortness Of Breath and Itching  . Septra [Sulfamethoxazole-Trimethoprim] Anaphylaxis and Hives  . Sulfa Antibiotics Anaphylaxis, Hives and Swelling    Throat swelling  . Sulfamethoxazole-Trimethoprim Anaphylaxis and Hives  . Sulfasalazine Anaphylaxis, Hives, Swelling and Other (See Comments)    Throat swelling  . Ultram [Tramadol] Shortness Of Breath, Anxiety and Other (See Comments)    "panic attacks"   . Ciprofloxacin Other (See Comments)    This medication was given along with a sulfa. Reaction is unknown at this time. Must list due to possible allergy.  Leodis [place]. Darvocet [Propoxyphene N-Acetaminophen] Itching  . Adhesive [Tape] Rash  . Codeine Itching and Other (See Comments)    No change when benadryl is given  . Demerol [Meperidine] Nausea Only  . Hydromorphone Itching and Other (See Comments)    No change when benadryl is given  . Losartan Palpitations    Current Outpatient Medications  Medication Sig Dispense Refill  . amitriptyline (ELAVIL) 150 MG tablet Take 1 tablet (150 mg total) by mouth at bedtime. 90 tablet 0  . carvedilol (COREG) 6.25 MG tablet Take 1 tablet (6.25 mg total) by mouth 2 (two) times daily. 180 tablet 1  . diazepam (VALIUM) 5 MG tablet Place 5 mg vaginally at bedtime.     . famotidine (PEPCID) 20 MG tablet TAKE 1 TABLET BY MOUTH TWICE DAILY 60 tablet 3  . FeFum-FePoly-FA-B Cmp-C-Biot (FOLIVANE-PLUS) CAPS Take 1 capsule by mouth daily.     Marland Kitchen. FLUoxetine (PROZAC) 40 MG capsule Take 1 capsule (40 mg total) by mouth daily. 90 capsule 0  . furosemide (LASIX) 20 MG tablet Take 1 tablet (20 mg total) by mouth daily as needed for fluid or edema. 30 tablet 1  . gabapentin (NEURONTIN) 300 MG capsule Take 1 capsule (300 mg total) by mouth 2 (two) times  daily.    . hydrOXYzine (VISTARIL) 25 MG capsule Take 25 mg by mouth as needed.  2  . lisinopril (PRINIVIL,ZESTRIL) 5 MG tablet Take 1 tablet (5 mg total) by mouth daily. 30 tablet 1  . potassium chloride (K-DUR,KLOR-CON) 10 MEQ tablet Take 1 tablet (10 mEq total) by mouth daily. As directed with lasix 60 tablet 1  . QUEtiapine (  SEROQUEL) 25 MG tablet Take 25 mg by mouth at bedtime.    . tapentadol (NUCYNTA) 50 MG tablet Take 1 tablet (50 mg total) by mouth every 12 (twelve) hours as needed for moderate pain. (Patient taking differently: Take 50 mg by mouth 3 (three) times daily. )    . tiZANidine (ZANAFLEX) 4 MG tablet Take 4 mg by mouth 2 (two) times daily.     . Zinc 50 MG CAPS Take 50 mg by mouth.    . benzonatate (TESSALON) 100 MG capsule Take 1 capsule (100 mg total) by mouth 2 (two) times daily as needed for cough. (Patient not taking: Reported on 02/21/2018) 20 capsule 0  . methocarbamol (ROBAXIN) 500 MG tablet Take 1 tablet (500 mg total) by mouth every 8 (eight) hours as needed. (Patient not taking: Reported on 02/21/2018) 30 tablet 0  . metoprolol tartrate (LOPRESSOR) 100 MG tablet Take 1 tablet (100 mg total) by mouth once for 1 dose. Take 1 tablet (100 mg) two hours before your procedure 1 tablet 0   No current facility-administered medications for this visit.     ROS:   General:  No weight loss, Fever, chills  HEENT: No recent headaches, no nasal bleeding, no visual changes, no sore throat  Neurologic: No dizziness, blackouts, seizures. No recent symptoms of stroke or mini- stroke. No recent episodes of slurred speech, or temporary blindness.  Cardiac: No recent episodes of chest pain/pressure, no shortness of breath at rest.  + shortness of breath with exertion.  Denies history of atrial fibrillation or irregular heartbeat  Vascular: No history of rest pain in feet.  No history of claudication.  No history of non-healing ulcer, No history of DVT   Pulmonary: No home oxygen,  no productive cough, no hemoptysis,  No asthma or wheezing  Musculoskeletal:  [X]  Arthritis, [X]  Low back pain,  [X]  Joint pain  Hematologic:No history of hypercoagulable state.  No history of easy bleeding.  No history of anemia  Gastrointestinal: No hematochezia or melena,  No gastroesophageal reflux, no trouble swallowing  Urinary: [ ]  chronic Kidney disease, [ ]  on HD - [ ]  MWF or [ ]  TTHS, [ ]  Burning with urination, [ ]  Frequent urination, [ ]  Difficulty urinating;   Skin: No rashes  Psychological: No history of anxiety,  No history of depression   Physical Examination  Vitals:   03/30/18 1103 03/30/18 1107  BP: (!) 156/100 (!) 153/98  Pulse: 90   Resp: 20   Temp: 98.3 F (36.8 C)   SpO2: 98%   Weight: 255 lb (115.7 kg)   Height: 5\' 3"  (1.6 m)     Body mass index is 45.17 kg/m.  General:  Alert and oriented, no acute distress HEENT: Normal Neck: No bruit or JVD Pulmonary: Clear to auscultation bilaterally Cardiac: Regular Rate and Rhythm without murmur Abdomen: Soft, non-tender, non-distended, no mass, obese Skin: No rash, no ulcer Extremity Pulses:  2+ radial, brachial, femoral, dorsalis pedis pulses bilaterally Musculoskeletal: No deformity trace pretibial edema  Neurologic: Upper and lower extremity motor 5/5 and symmetric  DATA:  Patient had a venous duplex exam of her left lower extremity today.  This showed mild reflux in the mid left greater saphenous with a 3 mm vein diameter.  The saphenofemoral junction was intact.  She also had deep vein reflux in the left common femoral vein.  ASSESSMENT: Left leg swelling probably multifactorial including dependent edema from sitting during the day.  Her pattern of swelling before  she even gets out of bed in the morning is not consistent with venous disease.  He has very mild reflux on exam and I do not believe that she would benefit significantly from laser ablation of her left greater saphenous.  She probably also  has a component of venous hypertension secondary to her obesity.  PLAN: Patient was given a recommendation for knee-high 20 to 30 mm compression stockings.  She will check the stockings that she has at home to see if it meets this criteria.  Otherwise she will purchase these in the near future.  She will follow-up with [place] on an as-needed basis.  Fabienne Bruns, MD [facility] Office: [phone] Pager: [phone]

## 2018-03-30 NOTE — Progress Notes (Signed)
   THERAPIST PROGRESS NOTE  Session Time: 11:10-12:00pm  Participation Level: Active  Behavioral Response: Casual/Alert/depressed  Type of Therapy: Individual Therapy  Treatment Goals addressed: Improve psychiatric symptoms, Controlled Behavior, Moderated Mood, Improve Unhelpful Thought Patterns, Emotional Regulation Skills (Moderate moods, anger management, stress management), Feel and express a full Range of Emotions, Learn about Diagnosis, Healthy Coping Skills.     Interventions: CBT Summary: Carly Delacruz is a 53 y.o. female   Suicidal/Homicidal: Nowithout intent/plan  Therapist Response: Pt presents for her individual counseling session. Pt discussed her psychiatric symptoms and current life events.  Pt presents mildly depressed today. She wanted to discuss her life before her physical health began to fail. Discussed her "new normal." Educated pt on the importance of gratitude and positive affirmations as part of her daily routine. Discussed 4 ways for acceptance. And the importance of self care. Pt has a shopping habit as she tried to fill the void in her life. For homework asked pt to make a list of all the perfumes she has purchased and not used within the last year and the amount of purchase. Will discuss this information at the next visit. Maybe donating the unopened perfume, suggestion.     Plan: Return again in 2 weeks for individual therapy/Homework assignment   Diagnosis: Axis 1: Severe recurrent major depressive disorder     [name],[name], LCAS 03/29/2018

## 2018-04-10 ENCOUNTER — Ambulatory Visit (INDEPENDENT_AMBULATORY_CARE_PROVIDER_SITE_OTHER): Payer: BLUE CROSS/BLUE SHIELD | Admitting: Nurse Practitioner

## 2018-04-10 ENCOUNTER — Ambulatory Visit (HOSPITAL_COMMUNITY): Payer: BLUE CROSS/BLUE SHIELD | Admitting: Licensed Clinical Social Worker

## 2018-04-10 ENCOUNTER — Encounter: Payer: Self-pay | Admitting: Nurse Practitioner

## 2018-04-10 VITALS — BP 122/88 | HR 94 | Temp 98.1°F | Resp 16 | Ht 63.0 in | Wt 247.0 lb

## 2018-04-10 DIAGNOSIS — S29011A Strain of muscle and tendon of front wall of thorax, initial encounter: Secondary | ICD-10-CM

## 2018-04-10 DIAGNOSIS — Z79899 Other long term (current) drug therapy: Secondary | ICD-10-CM

## 2018-04-10 DIAGNOSIS — R0789 Other chest pain: Secondary | ICD-10-CM

## 2018-04-10 DIAGNOSIS — T148XXA Other injury of unspecified body region, initial encounter: Secondary | ICD-10-CM

## 2018-04-10 NOTE — Progress Notes (Signed)
Carly Delacruz is a 53 y.o. female with the following history as recorded in Epic[employer]:  Patient Active Problem List   Diagnosis Date Noted  . Other chronic pain 02/01/2018  . Atypical chest pain 12/21/2017  . Lower extremity edema 12/21/2017  . Encounter for general adult medical examination with abnormal findings 08/04/2017  . Depression 05/08/2017  . Glaucoma 05/02/2017  . Chronic back pain 08/12/2016  . Anemia 08/12/2016  . H/O bladder infections 08/12/2016  . S/P gastric bypass 08/12/2016  . Polypharmacy   . Sleep-disordered breathing 07/26/2014  . GERD (gastroesophageal reflux disease) 06/15/2014  . Neuropathy 06/15/2014  . Essential hypertension 06/15/2014  . Obesity 05/08/2013  . Morbid obesity (HCC) 03/02/2003    Current Outpatient Medications  Medication Sig Dispense Refill  . amitriptyline (ELAVIL) 150 MG tablet Take 1 tablet (150 mg total) by mouth at bedtime. 90 tablet 0  . benzonatate (TESSALON) 100 MG capsule Take 1 capsule (100 mg total) by mouth 2 (two) times daily as needed for cough. 20 capsule 0  . carvedilol (COREG) 6.25 MG tablet Take 1 tablet (6.25 mg total) by mouth 2 (two) times daily. 180 tablet 1  . diazepam (VALIUM) 5 MG tablet Place 5 mg vaginally at bedtime.     . famotidine (PEPCID) 20 MG tablet TAKE 1 TABLET BY MOUTH TWICE DAILY 60 tablet 3  . FeFum-FePoly-FA-B Cmp-C-Biot (FOLIVANE-PLUS) CAPS Take 1 capsule by mouth daily.     Marland Kitchen FLUoxetine (PROZAC) 40 MG capsule Take 1 capsule (40 mg total) by mouth daily. 90 capsule 0  . furosemide (LASIX) 20 MG tablet Take 1 tablet (20 mg total) by mouth daily as needed for fluid or edema. 30 tablet 1  . gabapentin (NEURONTIN) 300 MG capsule Take 1 capsule (300 mg total) by mouth 2 (two) times daily.    . hydrOXYzine (VISTARIL) 25 MG capsule Take 25 mg by mouth as needed.  2  . lisinopril (PRINIVIL,ZESTRIL) 5 MG tablet Take 1 tablet (5 mg total) by mouth daily. 30 tablet 1  . methocarbamol (ROBAXIN) 500 MG  tablet Take 1 tablet (500 mg total) by mouth every 8 (eight) hours as needed. 30 tablet 0  . potassium chloride (K-DUR,KLOR-CON) 10 MEQ tablet Take 1 tablet (10 mEq total) by mouth daily. As directed with lasix 60 tablet 1  . QUEtiapine (SEROQUEL) 25 MG tablet Take 25 mg by mouth at bedtime.    . tapentadol (NUCYNTA) 50 MG tablet Take 1 tablet (50 mg total) by mouth every 12 (twelve) hours as needed for moderate pain. (Patient taking differently: Take 50 mg by mouth 3 (three) times daily. )    . tiZANidine (ZANAFLEX) 4 MG tablet Take 4 mg by mouth 2 (two) times daily.     . Zinc 50 MG CAPS Take 50 mg by mouth.    . metoprolol tartrate (LOPRESSOR) 100 MG tablet Take 1 tablet (100 mg total) by mouth once for 1 dose. Take 1 tablet (100 mg) two hours before your procedure 1 tablet 0   No current facility-administered medications for this visit.     Allergies: Morphine and related; Septra [sulfamethoxazole-trimethoprim]; Sulfa antibiotics; Sulfamethoxazole-trimethoprim; Sulfasalazine; Ultram [tramadol]; Ciprofloxacin; Darvocet [propoxyphene n-acetaminophen]; Adhesive [tape]; Codeine; Demerol [meperidine]; Hydromorphone; and Losartan  Past Medical History:  Diagnosis Date  . Allergy   . Anemia   . Anxiety   . Arthritis 12/10/03  . Atrophic vaginitis 2009  . BV (bacterial vaginosis) 2006  . Chronic back pain   . Chronic LLQ pain   .  Chronic post-traumatic stress disorder (PTSD)   . Complication of anesthesia   . Elevated LFTs   . Endometriosis   . Fatigue 2012  . Gallstones   . GERD (gastroesophageal reflux disease)   . H/O bladder infections   . H/O mumps   . H/O varicella   . History of candidal vulvovaginitis 03/21/03  . HTN (hypertension)   . Insomnia   . Insomnia secondary to situational depression 06/06/07  . Liver mass, right lobe 08/13/04   Nodule  . Malaise 2012  . Morbid obesity (HCC) 2005  . Osteoporosis   . S/P gastric bypass   . Severe episode of recurrent major depressive  disorder, without psychotic features (HCC)   . Sleep apnea 2005  . SUI (stress urinary incontinence, female) 08/12/03  . Trichomonas   . Vulvar lesion 03/21/03  . Yeast infection     Past Surgical History:  Procedure Laterality Date  . ABDOMINAL HYSTERECTOMY  2001  . APPENDECTOMY  2001  . CHOLECYSTECTOMY  2011  . DILATION AND CURETTAGE OF UTERUS    . GASTRIC BYPASS  2007   [facility], Dr. Daphine DeutscherMartin  . KNEE ARTHROSCOPY Left   . LAPAROTOMY    . TONSILLECTOMY    . WISDOM TOOTH EXTRACTION      Family History  Problem Relation Age of Onset  . Diabetes Paternal Grandmother   . Leukemia Maternal Grandmother   . Arthritis Mother   . Prostate cancer Father   . Colitis Sister   . Hypertension Sister   . Deep vein thrombosis Sister   . Kidney disease Brother   . Obstructive Sleep Apnea Brother     Social History   Tobacco Use  . Smoking status: Never Smoker  . Smokeless tobacco: Never Used  Substance Use Topics  . Alcohol use: No     Subjective:   Ms Ave FilterChandler is here today for evaluation of "chest pain"- which first began about one week ago when she felt a "popping" sensation in the middle of her chest while reaching for bedside light switch, then about 2 days later began to notice "sharp stabbing" pain intermittently middle of her chest, which radiates to her back, notices every time she moves. She has tried heat, ice, muscle relaxer without much improvement. She is accompanied by her mom, who has moved in with her recently, patient and mom state mom has moved in "due to her decline, falling more, can't take care of herself." her mom says she has taken away many of the patients medications/pain pills due to her falling, and "wants something done about this." Pt says she is hoping to get permament disability soon due to her pain and decreased mobility.  Review of Systems  Constitutional: Negative for chills and fever.  Cardiovascular: Positive for chest pain. Negative  for palpitations, orthopnea, claudication and PND.  Gastrointestinal: Negative for abdominal pain, nausea and vomiting.  Neurological: Negative for dizziness, loss of consciousness and weakness.  Psychiatric/Behavioral: Negative for memory loss.   Objective:  Vitals:   04/10/18 0926  BP: 122/88  Pulse: 94  Resp: 16  Temp: 98.1 F (36.7 C)  TempSrc: Oral  SpO2: 98%  Weight: 247 lb (112 kg)  Height: 5\' 3"  (1.6 m)    General: Well developed, well nourished, in no acute distress  Skin : Warm and dry.  Head: Normocephalic and atraumatic  Eyes: Sclera and conjunctiva clear; pupils round and reactive to light; extraocular movements intact  Oropharynx: Pink, supple. No suspicious lesions  Neck: Supple without thyromegaly, adenopathy  Lungs: Respirations unlabored; clear to auscultation bilaterally without wheeze, rales, rhonchi  CVS exam: normal rate, regular rhythm, normal S1, S2, no murmurs, rubs, clicks or gallops.  Musculoskeletal: No deformities; no active joint inflammation  Extremities: No  cyanosis, clubbing. Scant edema to BLE  Vessels: Symmetric bilaterally  Neurologic: Alert and oriented; speech intact; face symmetrical; moves all extremities well; CNII-XII intact without focal deficit  Psychiatric: Normal mood and affect.   Assessment:  1. Chest wall pain   2. Muscle strain   3. Polypharmacy     Plan:   Chest wall pain, Muscle strain She was actually worked up by cardiology for atypical CP last fall with extensive testing VS, PE unremarkable today Suspect muscle strain as source of her current pain Considered NSAID course but Due to multiple/sulfa allergies, no medication prescribed today Home management- heat/ice/muscle relaxer/stretching, red flags and return precautions including when to seek immediate/emergency care discussed and printed on AVS  No follow-ups on file.  No orders of the defined types were placed in this encounter.   Requested Prescriptions     No prescriptions requested or ordered in this encounter

## 2018-04-10 NOTE — Assessment & Plan Note (Addendum)
Per last neurology note on 11/22/17, it was suspected that falls were related to chronic pain, polypharmacy- no further neuro follow up given We discussed this today and she was encouraged to follow up with pain management and psychiatry provider for further discussion of meds/falls

## 2018-04-10 NOTE — Patient Instructions (Addendum)
Please follow up with psychiatry and pain management to talk more about your medications   Muscle Strain A muscle strain is an injury that happens when a muscle is stretched longer than normal. This can happen during a fall, sports, or lifting. This can tear some muscle fibers. Usually, recovery from muscle strain takes 1-2 weeks. Complete healing normally takes 5-6 weeks. This condition is first treated with PRICE therapy. This involves:  Protecting your muscle from being injured again.  Resting your injured muscle.  Icing your injured muscle.  Applying pressure (compression) to your injured muscle. This may be done with a splint or elastic bandage.  Raising (elevating) your injured muscle. Your doctor may also recommend medicine for pain. Follow these instructions at home: If you have a splint:  Wear the splint as told by your doctor. Take it off only as told by your doctor.  Loosen the splint if your fingers or toes tingle, get numb, or turn cold and blue.  Keep the splint clean.  If the splint is not waterproof: ? Do not let it get wet. ? Cover it with a watertight covering when you take a bath or a shower. Managing pain, stiffness, and swelling   If directed, put ice on your injured area. ? If you have a removable splint, take it off as told by your doctor. ? Put ice in a plastic bag. ? Place a towel between your skin and the bag. ? Leave the ice on for 20 minutes, 2-3 times a day.  Move your fingers or toes often. This helps to avoid stiffness and lessen swelling.  Raise your injured area above the level of your heart while you are sitting or lying down.  Wear an elastic bandage as told by your doctor. Make sure it is not too tight. General instructions  Take over-the-counter and prescription medicines only as told by your doctor.  Limit your activity. Rest your injured muscle as told by your doctor. Your doctor may say that gentle movements are okay.  If  physical therapy was prescribed, do exercises as told by your doctor.  Do not put pressure on any part of the splint until it is fully hardened. This may take many hours.  Do not use any products that contain nicotine or tobacco, such as cigarettes and e-cigarettes. These can delay bone healing. If you need help quitting, ask your doctor.  Warm up before you exercise. This helps to prevent more muscle strains.  Ask your doctor when it is safe to drive if you have a splint.  Keep all follow-up visits as told by your doctor. This is important. Contact a doctor if:  You have more pain or swelling in your injured area. Get help right away if:  You have any of these problems in your injured area: ? You have numbness. ? You have tingling. ? You lose a lot of strength. Summary  A muscle strain is an injury that happens when a muscle is stretched longer than normal.  This condition is first treated with PRICE therapy. This includes protecting, resting, icing, adding pressure, and raising your injury.  Limit your activity. Rest your injured muscle as told by your doctor. Your doctor may say that gentle movements are okay.  Warm up before you exercise. This helps to prevent more muscle strains. This information is not intended to replace advice given to you by your health care provider. Make sure you discuss any questions you have with your health care provider.  Document Released: 11/25/2007 Document Revised: 03/24/2016 Document Reviewed: 03/24/2016 Elsevier Interactive Patient Education  2019 [employer].

## 2018-04-11 ENCOUNTER — Ambulatory Visit: Payer: BLUE CROSS/BLUE SHIELD | Admitting: Cardiovascular Disease

## 2018-04-11 MED FILL — QUETIAPINE 25 MG TABLET: 25 | 30 days supply | Qty: 60 | Fill #0

## 2018-04-12 ENCOUNTER — Ambulatory Visit: Payer: Self-pay | Admitting: Nurse Practitioner

## 2018-04-12 ENCOUNTER — Telehealth: Payer: Self-pay | Admitting: Nurse Practitioner

## 2018-04-12 NOTE — Telephone Encounter (Signed)
I received long term disability paperwork from her today, please just let her know that I will not be able to fill this out, as we discussed at her recent appointment, recommend she discuss with pain management or psychiatry.

## 2018-04-12 NOTE — Telephone Encounter (Signed)
Left mess for patient to call back. CRM created.  

## 2018-04-14 ENCOUNTER — Other Ambulatory Visit: Payer: Self-pay | Admitting: Cardiovascular Disease

## 2018-04-14 DIAGNOSIS — I10 Essential (primary) hypertension: Secondary | ICD-10-CM

## 2018-04-14 MED FILL — CARVEDILOL 6.25 MG TABLET: 6.25 | 30 days supply | Qty: 60 | Fill #0

## 2018-04-14 NOTE — Telephone Encounter (Signed)
° ° °  Pharmacy needs clarification on dosage . Please call

## 2018-04-14 NOTE — Telephone Encounter (Signed)
Rx request sent to pharmacy.  

## 2018-04-14 NOTE — Telephone Encounter (Signed)
° ° ° °*  STAT* If patient is at the pharmacy, call can be transferred to refill team.   1. Which medications need to be refilled? (please list name of each medication and dose if known) carvedilol (COREG) 6.25 MG tablet  2. Which pharmacy/location (including street and city if local pharmacy) is medication to be sent to? [facility] - [place], [place] - [address] [place]   3. Do they need a 30 day or 90 day supply? 90

## 2018-04-14 NOTE — Telephone Encounter (Signed)
Spoke with [facility] pharmacy who report pt states her coreg was increased to 12.5 mg BID. Per review of chart and Pharm D, pt is to only take 6.25 mg BID. Pharmacy rep updated along with Pt. Pharm D also recommends pt schedule a f/u appointment. Appointment scheduled for 3/3 at 130 pm.

## 2018-04-18 ENCOUNTER — Other Ambulatory Visit: Payer: Self-pay | Admitting: Nurse Practitioner

## 2018-04-18 NOTE — Telephone Encounter (Signed)
Copied from CRM [id number]. Topic: Quick Communication - Rx Refill/Question >> Apr 18, 2018  7:26 AM Fanny Bien wrote: Medication: tiZANidine (ZANAFLEX) 4 MG tablet [[id number]]  Pt would like a call back from the nurse   Has the patient contacted their pharmacy? no Preferred Pharmacy (with phone number or street name):[facility] - [place], [place] - [address] [place] [phone] (Phone) [phone] (Fax)  Agent: Please be advised that RX refills may take up to 3 business days. We ask that you follow-up with your pharmacy.

## 2018-04-18 NOTE — Telephone Encounter (Signed)
Requested medication (s) are due for refill today: yes  Requested medication (s) are on the active medication list: yes  Last refill:  05/07/13 (historical med)  Future visit scheduled: no  Notes to clinic:  Pt c/o pulled muscle in chest. Pt was seen by provider for this issue. Pt stated she does not have an appointment with the pain clinic until 05/22/18. Pt sated the pharmacy may not fill the tizanidine pt stated "worried about exceeding limit."Pt stated she Ms. Shambley NP could change to Robaxin or Flexeril and stated will come in for appointment if needed.   Requested Prescriptions  Pending Prescriptions Disp Refills   tiZANidine (ZANAFLEX) 4 MG tablet 30 tablet     Sig: Take 1 tablet (4 mg total) by mouth 2 (two) times daily.     Not Delegated - Cardiovascular:  Alpha-2 Agonists - tizanidine Failed - 04/18/2018  7:31 AM      Failed - This refill cannot be delegated      Passed - Valid encounter within last 6 months    Recent Outpatient Visits          1 week ago Chest wall pain   [facility] -Nila Nephew, Audie Box, NP   2 months ago Fall, subsequent encounter   [facility] -Nila Nephew, Audie Box, NP   4 months ago Chest pain, unspecified type   [facility] -Nila Nephew, Audie Box, NP   5 months ago Pain of left lower extremity   [facility] -Einar Crow, NP   5 months ago Syncope, unspecified syncope type   [facility] -Einar Crow, NP      Future Appointments            In 1 week Allyson Sabal Delton See, MD [facility]

## 2018-04-19 ENCOUNTER — Telehealth: Payer: Self-pay

## 2018-04-19 ENCOUNTER — Other Ambulatory Visit: Payer: Self-pay | Admitting: Pharmacist

## 2018-04-19 DIAGNOSIS — I10 Essential (primary) hypertension: Secondary | ICD-10-CM

## 2018-04-19 MED ORDER — TIZANIDINE HCL 4 MG PO TABS: 4.0000 mg | ORAL_TABLET | Freq: Two times a day (BID) | ORAL | 0 refills | Status: DC

## 2018-04-19 MED ORDER — CARVEDILOL 6.25 MG PO TABS: 6.2500 mg | ORAL_TABLET | Freq: Two times a day (BID) | ORAL | 1 refills | Status: DC

## 2018-04-19 MED ORDER — LISINOPRIL 5 MG PO TABS: 5.0000 mg | ORAL_TABLET | Freq: Every day | ORAL | 1 refills | Status: DC

## 2018-04-19 MED FILL — LISINOPRIL 5 MG TABLET: 5 | 30 days supply | Qty: 30 | Fill #0

## 2018-04-19 NOTE — Addendum Note (Signed)
Addended by: Pearletha Furl on: 04/19/2018 02:44 PM   Modules accepted: Orders

## 2018-04-19 NOTE — Telephone Encounter (Signed)
All appts clarified now.   Patient should be taking: Carvedilol 6.25mg  twice daily Lisinopril 5mg  daily   F/u with DR Allyson Sabal in 6 days, F/u with DR Mayford Knife in 2 weeks, f/u with HTN clinic in 3 weeks

## 2018-04-19 NOTE — Telephone Encounter (Signed)
Called and left msg for pt to call us back to reschedule the [facility] pharm[facility] appt due to a provider being out of the office and also since the pt has 2 different cardio appts the pharmd would prefer to see the pt after they are done with the other 2 appts.

## 2018-04-20 MED FILL — tiZANidine HCL 4 MG TABS: 4 | 15 days supply | Qty: 30 | Fill #0

## 2018-04-25 ENCOUNTER — Ambulatory Visit: Payer: BLUE CROSS/BLUE SHIELD | Admitting: Cardiovascular Disease

## 2018-04-25 ENCOUNTER — Ambulatory Visit (HOSPITAL_COMMUNITY): Payer: BLUE CROSS/BLUE SHIELD | Admitting: Licensed Clinical Social Worker

## 2018-05-02 ENCOUNTER — Ambulatory Visit (HOSPITAL_COMMUNITY): Payer: BLUE CROSS/BLUE SHIELD | Admitting: Licensed Clinical Social Worker

## 2018-05-02 ENCOUNTER — Ambulatory Visit: Payer: Self-pay

## 2018-05-02 ENCOUNTER — Ambulatory Visit: Payer: BLUE CROSS/BLUE SHIELD | Admitting: Cardiovascular Disease

## 2018-05-03 ENCOUNTER — Ambulatory Visit: Payer: BLUE CROSS/BLUE SHIELD | Admitting: Cardiology

## 2018-05-03 ENCOUNTER — Ambulatory Visit: Payer: BLUE CROSS/BLUE SHIELD | Admitting: Cardiovascular Disease

## 2018-05-03 MED FILL — traZODone HCL 50 MG TABS: 50 | 10 days supply | Qty: 30 | Fill #0

## 2018-05-04 ENCOUNTER — Telehealth: Payer: Self-pay | Admitting: Cardiovascular Disease

## 2018-05-04 NOTE — Telephone Encounter (Signed)
Called patient, she is not able to make her visit tomorrow as she is sick with a stomach bug. I rescheduled appointment for the 31st of March. Patient verbalized understanding.   She has appointment with PharmD on the 10th and wanted to know if she needed to keep that since she had to move the appointment with Dr.Berry back. Please advise.

## 2018-05-04 NOTE — Telephone Encounter (Signed)
New message:    Patient calling concerning her appt tomorrow, patient is not feeling well she has been throwing up. Please call patient about her appt.

## 2018-05-05 ENCOUNTER — Ambulatory Visit: Payer: BLUE CROSS/BLUE SHIELD | Admitting: Cardiovascular Disease

## 2018-05-05 NOTE — Telephone Encounter (Signed)
Leave message for pt to call back 

## 2018-05-05 NOTE — Telephone Encounter (Signed)
She should keep both appointments

## 2018-05-05 NOTE — Telephone Encounter (Signed)
spoke with pt for her to keep both appt, pt voice understanding and thanks

## 2018-05-09 ENCOUNTER — Ambulatory Visit: Payer: BLUE CROSS/BLUE SHIELD

## 2018-05-17 MED FILL — QUETIAPINE 25 MG TABLET: 25 | 15 days supply | Qty: 30 | Fill #1

## 2018-05-17 MED FILL — FLUoxetine HCL 40 MG CAPS: 40 | 90 days supply | Qty: 90 | Fill #0

## 2018-05-17 MED FILL — AMITRIPTYLINE HCL 150 MG TA: 150 | 30 days supply | Qty: 30 | Fill #0

## 2018-05-17 MED FILL — FAMOTIDINE 20 MG TABLET: 20 | 30 days supply | Qty: 60 | Fill #1

## 2018-05-17 MED FILL — diazePAM 5 MG TABS: 5 | 16 days supply | Qty: 50 | Fill #0

## 2018-05-18 DIAGNOSIS — M461 Sacroiliitis, not elsewhere classified: Secondary | ICD-10-CM | POA: Diagnosis not present

## 2018-05-18 DIAGNOSIS — G4700 Insomnia, unspecified: Secondary | ICD-10-CM | POA: Diagnosis not present

## 2018-05-18 DIAGNOSIS — G894 Chronic pain syndrome: Secondary | ICD-10-CM | POA: Diagnosis not present

## 2018-05-18 DIAGNOSIS — M47816 Spondylosis without myelopathy or radiculopathy, lumbar region: Secondary | ICD-10-CM | POA: Diagnosis not present

## 2018-05-18 MED FILL — tiZANidine HCL 4 MG TABS: 4 | 90 days supply | Qty: 270 | Fill #0

## 2018-05-18 MED FILL — GABAPENTIN 300 MG CAPSULE: 300 | 90 days supply | Qty: 270 | Fill #0

## 2018-05-18 MED FILL — LISINOPRIL 5 MG TABLET: 5 | 30 days supply | Qty: 30 | Fill #1

## 2018-05-18 MED FILL — CARVEDILOL 6.25 MG TABLET: 6.25 | 90 days supply | Qty: 180 | Fill #0

## 2018-05-18 MED FILL — NUCYNTA 50 MG TABS: 50 | 30 days supply | Qty: 90 | Fill #0

## 2018-05-24 ENCOUNTER — Telehealth: Payer: Self-pay | Admitting: *Deleted

## 2018-05-24 NOTE — Telephone Encounter (Signed)
Follow up: ° ° ° °Patient returning call. Please call patient back. °

## 2018-05-24 NOTE — Telephone Encounter (Addendum)
Left message for pt to call    ----- Message from Runell Gess, MD sent at 05/24/2018 12:11 PM EDT ----- Coronary CTA showed no evidence of CAD.

## 2018-05-25 NOTE — Telephone Encounter (Signed)
Spoke with pt, aware of CTA results. She reports her bp is running 152/98. She is having trouble with her back and is trying to get an epidural injection but they will not do it as long as her bp is high. Her home cuff is not working. She has an appointment 05/30/18 and has a smart phone and is willing to try a virtual appointment is dr berry would like. Will forward to dr berry to review and advise. Medications confirmed.

## 2018-05-25 NOTE — Telephone Encounter (Signed)
Happy to do that although it's difficult to manage HTN virtually especially if she doesn't have a BP cuff that works

## 2018-05-26 NOTE — Telephone Encounter (Signed)
Left message for pt to call.

## 2018-05-29 ENCOUNTER — Telehealth: Payer: Self-pay

## 2018-05-29 ENCOUNTER — Other Ambulatory Visit: Payer: Self-pay

## 2018-05-29 NOTE — Telephone Encounter (Signed)
Pt is still trying to figure out how to do a virtual visit. She may cal right at 8am for help if she can not figure it out on her own

## 2018-05-29 NOTE — Telephone Encounter (Addendum)
Patient has new cell phone and doesn't know how to operate it just yet. Although I was willing to walk her through the process and do a practice video call with her she was still unable to get into her APP store. She is willing to speak with Dr. Allyson Sabal on the telephone she stated that her home number would be best. Patient was asked about her BP cuff/machine. Patient has not gotten a new one and she doesn't have a scale to weigh herself for the tele-health visit. She has a weight from last week's MD appointment.

## 2018-05-29 NOTE — Telephone Encounter (Signed)
Spoke with pt. Pt aware that virtual visit is at 0900 on 3/31. Pt agreeable with this

## 2018-05-29 NOTE — Telephone Encounter (Signed)
Virtual Visit Pre-Appointment Phone Call  Steps For Call:  1. Confirm consent - "In the setting of the current Covid19 crisis, you are scheduled for a (phone or video) visit with your provider on (date) at (time).  Just as we do with many in-office visits, in order for you to participate in this visit, we must obtain consent.  If you'd like, I can send this to your mychart (if signed up) or email for you to review.  Otherwise, I can obtain your verbal consent now.  All virtual visits are billed to your insurance company just like a normal visit would be.  By agreeing to a virtual visit, we'd like you to understand that the technology does not allow for your provider to perform an examination, and thus may limit your provider's ability to fully assess your condition.  Finally, though the technology is pretty good, we cannot assure that it will always work on either your or our end, and in the setting of a video visit, we may have to convert it to a phone-only visit.  In either situation, we cannot ensure that we have a secure connection.  Are you willing to proceed?"  2. Give patient instructions for WebEx download to smartphone as below if video visit  3. Advise patient to be prepared with any vital sign or heart rhythm information, their current medicines, and a piece of paper and pen handy for any instructions they may receive the day of their visit  4. Inform patient they will receive a phone call 15 minutes prior to their appointment time (may be from unknown caller ID) so they should be prepared to answer  5. Confirm that appointment type is correct in Epic appointment notes (video vs telephone)    TELEPHONE CALL NOTE  KIAURA AVALLONE has been deemed a candidate for a follow-up tele-health visit to limit community exposure during the Covid-19 pandemic. I spoke with the patient via phone to ensure availability of phone/video source, confirm preferred  phone number, and discuss  instructions and expectations.  I reminded Carly Delacruz to be prepared with any vital sign and/or heart rhythm information that could potentially be obtained via home monitoring, at the time of her visit. I reminded JILLYAN GUE to expect a phone call at the time of her visit if her visit.  Did the patient verbally acknowledge consent to treatment? Yes  Dorris Fetch, CMA 05/29/2018 11:43 AM   DOWNLOADING THE WEBEX SOFTWARE TO SMARTPHONE  - If Apple, go to [employer] and type in WebEx in the search bar. Download [employer], the blue/green circle. The app is free but as with any other app downloads, their phone may require them to verify saved payment information or Apple password. The patient does NOT have to create an account.  - If Android, ask patient to go to [employer] and type in WebEx in the search bar. Download [employer], the blue/green circle. The app is free but as with any other app downloads, their phone may require them to verify saved payment information or Android password. The patient does NOT have to create an account.   CONSENT FOR TELE-HEALTH VISIT - PLEASE REVIEW  I hereby voluntarily request, consent and authorize [facility] and its employed or contracted physicians, physician assistants, nurse practitioners or other licensed health care professionals (the Practitioner), to provide me with telemedicine health care services (the "Services") as deemed necessary by the treating Practitioner. I acknowledge  and consent to receive the Services by the Practitioner via telemedicine. I understand that the telemedicine visit will involve communicating with the Practitioner through live audiovisual communication technology and the disclosure of certain medical information by electronic transmission. I acknowledge that I have been given the opportunity to request an in-person assessment or other available alternative prior to the telemedicine visit  and am voluntarily participating in the telemedicine visit.  I understand that I have the right to withhold or withdraw my consent to the use of telemedicine in the course of my care at any time, without affecting my right to future care or treatment, and that the Practitioner or I may terminate the telemedicine visit at any time. I understand that I have the right to inspect all information obtained and/or recorded in the course of the telemedicine visit and may receive copies of available information for a reasonable fee.  I understand that some of the potential risks of receiving the Services via telemedicine include:  Marland Kitchen Delay or interruption in medical evaluation due to technological equipment failure or disruption; . Information transmitted may not be sufficient (e.g. poor resolution of images) to allow for appropriate medical decision making by the Practitioner; and/or  . In rare instances, security protocols could fail, causing a breach of personal health information.  Furthermore, I acknowledge that it is my responsibility to provide information about my medical history, conditions and care that is complete and accurate to the best of my ability. I acknowledge that Practitioner's advice, recommendations, and/or decision may be based on factors not within their control, such as incomplete or inaccurate data provided by me or distortions of diagnostic images or specimens that may result from electronic transmissions. I understand that the practice of medicine is not an exact science and that Practitioner makes no warranties or guarantees regarding treatment outcomes. I acknowledge that I will receive a copy of this consent concurrently upon execution via email to the email address I last provided but may also request a printed copy by calling the office of [facility].    I understand that my insurance will be billed for this visit.   I have read or had this consent read to me. . I understand  the contents of this consent, which adequately explains the benefits and risks of the Services being provided via telemedicine.  . I have been provided ample opportunity to ask questions regarding this consent and the Services and have had my questions answered to my satisfaction. . I give my informed consent for the services to be provided through the use of telemedicine in my medical care  By participating in this telemedicine visit I agree to the above.

## 2018-05-30 ENCOUNTER — Telehealth: Payer: Self-pay | Admitting: Cardiovascular Disease

## 2018-05-30 ENCOUNTER — Telehealth (INDEPENDENT_AMBULATORY_CARE_PROVIDER_SITE_OTHER): Payer: BLUE CROSS/BLUE SHIELD | Admitting: Cardiovascular Disease

## 2018-05-30 ENCOUNTER — Telehealth: Payer: Self-pay

## 2018-05-30 DIAGNOSIS — I10 Essential (primary) hypertension: Secondary | ICD-10-CM

## 2018-05-30 DIAGNOSIS — Z79899 Other long term (current) drug therapy: Secondary | ICD-10-CM

## 2018-05-30 DIAGNOSIS — R600 Localized edema: Secondary | ICD-10-CM | POA: Diagnosis not present

## 2018-05-30 DIAGNOSIS — R0789 Other chest pain: Secondary | ICD-10-CM

## 2018-05-30 MED ORDER — LISINOPRIL 10 MG PO TABS: 10.0000 mg | ORAL_TABLET | Freq: Every day | ORAL | 3 refills | Status: DC

## 2018-05-30 NOTE — Telephone Encounter (Signed)
Pt has virtual visit scheduled with JB for today.  Do you know if this is going to happen?

## 2018-05-30 NOTE — Patient Instructions (Signed)
Medication Instructions:  Your physician recommends that you continue on your current medications as directed. Please refer to the Current Medication list given to you today.  If you need a refill on your cardiac medications before your next appointment, please call your pharmacy.   Lab work: NONE If you have labs (blood work) drawn today and your tests are completely normal, you will receive your results only by: Marland Kitchen MyChart Message (if you have MyChart) OR . A paper copy in the mail If you have any lab test that is abnormal or we need to change your treatment, we will call you to review the results.  Testing/Procedures: NONE  Follow-Up: At [facility], you and your health needs are our priority.  As part of our continuing mission to provide you with exceptional heart care, we have created designated Provider Care Teams.  These Care Teams include your primary Cardiologist (physician) and Advanced Practice Providers (APPs -  Physician Assistants and Nurse Practitioners) who all work together to provide you with the care you need, when you need it. . You will need a follow up appointment in 6 months WITH AN APP and in 12 months WITH DR. Allyson Sabal.  Please call our office 2 months in advance to schedule [facility] appointment.  You may see one of the following Advanced Practice Providers on your designated Care Team:   . Corine Shelter, [place] . Azalee Course, PA-C . Micah Flesher, PA-C . Joni Reining, DNP . Theodore Demark, PA-C . Judy Pimple, PA-C . Marjie Skiff, PA-C  Any Other Special Instructions Will Be Listed Below (If Applicable). PLEASE CONTACT OUR OFFICE TO REPORT YOUR UPDATED BLOOD PRESSURE READINGS.

## 2018-05-30 NOTE — Telephone Encounter (Signed)
SPOKE TO patient  - instruction given . Patient states she has  Enough 5 mg  Tablets to double dose   Until labs are completed - labs will be done 2 06/13/18   Patient is aware  To come to office  For lab and to keep a log of blood pressure   patient also aware to contact office when she needs prescription to be filled with increase  Dose - 10 mg lisinopril

## 2018-05-30 NOTE — Telephone Encounter (Signed)
   Cardiac Questionnaire:    Since your last visit or hospitalization:    1. Have you been having new or worsening chest pain?  YES; LAST Thursday. THINKS SHE CAUGHT THE FLU AND HAD CHEST PAIN WHILE COUGHING (PCP TOLD HER SHE MAY HAVE PULLED A MUSCLE)   2. Have you been having new or worsening shortness of breath? NO 3. Have you been having new or worsening leg swelling, wt gain, or increase in abdominal girth (pants fitting more tightly)? TAKES DIURETIC FOR BLE EDEMA AND IT HELPS   4. Have you had any passing out spells? NO    *A YES to any of these questions would result in the appointment being kept. *If all the answers to these questions are NO, we should indicate that given the current situation regarding the worldwide coronarvirus pandemic, at the recommendation of the CDC, we are looking to limit gatherings in our waiting area, and thus will reschedule their appointment beyond four weeks from today.   _____________   COVID-19 Pre-Screening Questions:  . Do you currently have a fever? NO (yes = cancel and refer to pcp for e-visit) . Have you recently travelled on a cruise, internationally, or to [place], [place], [place], [place], [place], or [place], [place] [employer]) ? NO (yes = cancel, stay home, monitor symptoms, and contact pcp or initiate e-visit if symptoms develop) . Have you been in contact with someone that is currently pending confirmation of Covid19 testing or has been confirmed to have the Covid19 virus?  NO (yes = cancel, stay home, away from tested individual, monitor symptoms, and contact pcp or initiate e-visit if symptoms develop) . Are you currently experiencing fatigue or cough? JUST FATIGUE (yes = pt should be prepared to have a mask placed at the time of their visit).

## 2018-05-30 NOTE — Telephone Encounter (Signed)
Her blood pressure which she took at home today was elevated.  Please have her keep a blood pressure log over the next 30 days and have Carly Delacruz perform a WebEx telemedicine visit in 1 month to review and make recommendations.

## 2018-05-30 NOTE — Telephone Encounter (Signed)
Follow up    Pt is returning call and asked for you to please call back in 30 minutes  Thank you

## 2018-05-30 NOTE — Telephone Encounter (Signed)
New Message   Patient states Dr. Allyson Sabal wanted her blood pressure which is 174/111.

## 2018-05-30 NOTE — Telephone Encounter (Signed)
Increase lisinopril to 10 mg daily (currently on 5 mg daily).  May take 3-5 days to see steady decline in BP readings.  Ideally would need BMET in 10-14 days

## 2018-05-30 NOTE — Telephone Encounter (Signed)
Pharm D recommendations  included in telephone encounter.   Dr. Hazle Coca recommendations:  "Her blood pressure which she took at home today was elevated.  Please have her keep a blood pressure log over the next 30 days and have Carly Delacruz perform a WebEx telemedicine visit in 1 month to review and make recommendations."

## 2018-05-30 NOTE — Telephone Encounter (Signed)
New Message   Pt is calling because she went for a procedure today for a spinal epidural and they would not do it because they took her BP and it was high  174/111 154/108  Please call back

## 2018-05-30 NOTE — Telephone Encounter (Signed)
This pt had E-visit today (pt did not have functioning BP cuff to collect vitals for visit). Pt presented for spinal epidural appt today and they would not do it because  BP high: Readings provided by pt.  174/111 154/108  Will contact pt

## 2018-05-30 NOTE — Telephone Encounter (Signed)
Spoke with pt who states on symptom is headache; Pt aware that message routed to Dr. Allyson Sabal and pharmD for review and nurse will call back with any recommendations

## 2018-05-30 NOTE — Telephone Encounter (Signed)
AVS mailed to pt.

## 2018-05-30 NOTE — Progress Notes (Signed)
Virtual Visit via Telephone Note    Evaluation Performed:  Follow-up visit  This visit type was conducted due to national recommendations for restrictions regarding the COVID-19 Pandemic (e.g. social distancing).  This format is felt to be most appropriate for this patient at this time.  All issues noted in this document were discussed and addressed.  No physical exam was performed (except for noted visual exam findings with Video Visits).  Please refer to the patient's chart (MyChart message for video visits and phone note for telephone visits) for the patient's consent to telehealth for [facility].  Date:  05/30/2018   ID:  Carly Delacruz, DOB [birth year], MRN [id number]  Patient Location:  Patient's home  Provider location:   [facility] line office  PCP:  Patient, No Pcp Per  Cardiologist: Dr. Nanetta Batty Electrophysiologist:  None   Chief Complaint: Atypical chest pain  History of Present Illness:    Carly Delacruz is a 53 y.o. female who presents via audio/video conferencing for a telehealth visit today.    The patient does symptoms concerning for COVID-19 infection (fever, chills, cough, or new shortness of breath).   Carly Delacruz is a 53 y.o. 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  She stopped working in June of 2018. She was referred by Catarina Hartshorn, NP, for evaluation of chest pain.  She was seen by Rollene Rotunda 04/29/2016 for similar problems.  Work-up was recommended but never pursued.  I last saw her in the office 12/21/2017. There is no family history of heart disease.  She is never had a heart attack or stroke.  She does have a history of obstructive sleep apnea but does not wear CPAP currently because of weight loss as a result of gastric bypass surgery in 2001 when she decreased her weight from 315 down to 250.  She does  have a history of depression, and GERD.  She has treated hypertension as well.  She was seen in the emergency room on 12/04/2017 for chest pain.  Her d-dimer was mildly elevated which resulted in venous Dopplers which were negative and chest CT which was negative as well for PE or dissection.    Since I saw her in the office back in October I did get a 2D echo which was completely normal and a coronary CTA that revealed a coronary calcium score of 18 with mild proximal LAD the stenosis that was ultimately shown to be not physiologically significant by FFR.  She had an episode of musculoskeletal chest pain earlier this year when stretching internal light but other than that has been relatively asymptomatic.  She did have a febrile illness several weeks ago characterized by fever, nausea vomiting and diarrhea as well as cough which has since resolved.   Prior CV studies:   The following studies were reviewed today:  2D echocardiogram, coronary CTA  Past Medical History:  Diagnosis Date  . Allergy   . Anemia   . Anxiety   . Arthritis 12/10/03  . Atrophic vaginitis 2009  . BV (bacterial vaginosis) 2006  . Chronic back pain   . Chronic LLQ pain   . Chronic post-traumatic stress disorder (PTSD)   . Complication of anesthesia   . Elevated LFTs   . Endometriosis   . Fatigue 2012  . Gallstones   . GERD (gastroesophageal reflux disease)   . H/O  bladder infections   . H/O mumps   . H/O varicella   . History of candidal vulvovaginitis 03/21/03  . HTN (hypertension)   . Insomnia   . Insomnia secondary to situational depression 06/06/07  . Liver mass, right lobe 08/13/04   Nodule  . Malaise 2012  . Morbid obesity (HCC) 2005  . Osteoporosis   . S/P gastric bypass   . Severe episode of recurrent major depressive disorder, without psychotic features (HCC)   . Sleep apnea 2005  . SUI (stress urinary incontinence, female) 08/12/03  . Trichomonas   . Vulvar lesion 03/21/03  . Yeast infection     Past Surgical History:  Procedure Laterality Date  . ABDOMINAL HYSTERECTOMY  2001  . APPENDECTOMY  2001  . CHOLECYSTECTOMY  2011  . DILATION AND CURETTAGE OF UTERUS    . GASTRIC BYPASS  2007   [facility], Dr. Daphine Deutscher  . KNEE ARTHROSCOPY Left   . LAPAROTOMY    . TONSILLECTOMY    . WISDOM TOOTH EXTRACTION       Current Meds  Medication Sig  . tapentadol (NUCYNTA) 50 MG tablet Take 50 mg by mouth 3 (three) times daily.     Allergies:   Morphine and related; Septra [sulfamethoxazole-trimethoprim]; Sulfa antibiotics; Sulfamethoxazole-trimethoprim; Sulfasalazine; Ultram [tramadol]; Ciprofloxacin; Darvocet [propoxyphene n-acetaminophen]; Adhesive [tape]; Codeine; Demerol [meperidine]; Hydromorphone; and Losartan   Social History   Tobacco Use  . Smoking status: Never Smoker  . Smokeless tobacco: Never Used  Substance Use Topics  . Alcohol use: No  . Drug use: No     Family Hx: The patient's family history includes Arthritis in her mother; Colitis in her sister; Deep vein thrombosis in her sister; Diabetes in her paternal grandmother; Hypertension in her sister; Kidney disease in her brother; Leukemia in her maternal grandmother; Obstructive Sleep Apnea in her brother; Prostate cancer in her father.  ROS:   Please see the history of present illness.     All other systems reviewed and are negative.   Labs/Other Tests and Data Reviewed:    Recent Labs: 10/03/2017: ALT 18; Pro B Natriuretic peptide (BNP) 109.0 03/20/2018: BUN 10; Creatinine, Ser 1.04; Hemoglobin 12.3; Platelets 363; Potassium 4.7; Sodium 143   Recent Lipid Panel Lab Results  Component Value Date/Time   CHOL 208 (H) 08/04/2017 10:35 AM   TRIG 83.0 08/04/2017 10:35 AM   HDL 69.70 08/04/2017 10:35 AM   CHOLHDL 3 08/04/2017 10:35 AM   LDLCALC 122 (H) 08/04/2017 10:35 AM    Wt Readings from Last 3 Encounters:  04/10/18 247 lb (112 kg)  03/30/18 255 lb (115.7 kg)  02/01/18 255 lb (115.7 kg)      Objective:    Vital Signs:  There were no vitals taken for this visit.   A physical exam was not performed at this visit since it was a "telephone/tele-visit".  ASSESSMENT & PLAN:    1.  Atypical chest pain- patient was complaining of some atypical chest pain when I saw her in the office last 12/21/2017.  She did have a 2D echo performed 12/27/2017 that was essentially normal and a coronary CTA performed 03/21/2018 that showed a coronary calcium score of 18 with mild proximal LAD stenosis that was shown ultimately to be nonsignificant by FFR analysis.  She is had little to no significant chest pain since.  2.  Obstructive sleep apnea- currently does not wear CPAP because of weight loss due to gastric bypass surgery  3.  Lower extremity edema- principally  confined to her feet and ankles.  She does take as needed Lasix for this.  4.  Essential hypertension- blood pressure was not assessed today since this was a "tele-visit".  She is on carvedilol and lisinopril.  COVID-19 Education: The signs and symptoms of COVID-19 were discussed with the patient and how to seek care for testing (follow up with PCP or arrange E-visit).  The importance of social distancing was discussed today.  Patient Risk:   After full review of this patient's clinical status, I feel that they are at least moderate risk at this time.  Time:   Today, I have spent 15 minutes with the patient with telehealth technology discussing of this is cannot cannot wear the same mask that were in the previous patients room positive nasal.     Medication Adjustments/Labs and Tests Ordered: Current medicines are reviewed at length with the patient today.  Concerns regarding medicines are outlined above.  Tests Ordered: No orders of the defined types were placed in this encounter.  Medication Changes: No orders of the defined types were placed in this encounter.   Disposition:  Follow up in 6 month(s) with an APP and 1 year with  me  Signed, Nanetta Batty, MD  05/30/2018 9:29 AM    [facility]

## 2018-05-30 NOTE — Telephone Encounter (Signed)
Called patient - no answer - unable to leave message - voicemail is full

## 2018-06-01 NOTE — Telephone Encounter (Signed)
Per DPR; left detailed message that Dr. Allyson Sabal wants pt to keep daily BP log for 30 days and f/u with pharmD via virtual visit and that schedulers would be in contact to help schedule appt

## 2018-06-01 NOTE — Telephone Encounter (Signed)
Per DPR; left detailed message that Dr. Berry wants pt to keep daily BP log for 30 days and f/u with pharmD via virtual visit and that schedulers would be in contact to help schedule appt 

## 2018-06-15 MED FILL — AMITRIPTYLINE HCL 150 MG TA: 150 | 30 days supply | Qty: 30 | Fill #1

## 2018-06-16 MED FILL — NUCYNTA 50 MG TABS: 50 | 30 days supply | Qty: 90 | Fill #0

## 2018-06-19 DIAGNOSIS — M5416 Radiculopathy, lumbar region: Secondary | ICD-10-CM | POA: Diagnosis not present

## 2018-06-21 ENCOUNTER — Encounter: Payer: Self-pay | Admitting: Internal Medicine

## 2018-06-28 DIAGNOSIS — N9489 Other specified conditions associated with female genital organs and menstrual cycle: Secondary | ICD-10-CM | POA: Diagnosis not present

## 2018-06-28 DIAGNOSIS — F329 Major depressive disorder, single episode, unspecified: Secondary | ICD-10-CM | POA: Diagnosis not present

## 2018-06-28 MED FILL — diazePAM 5 MG TABS: 5 | 16 days supply | Qty: 50 | Fill #0

## 2018-06-28 MED FILL — QUETIAPINE 25 MG TABLET: 25 | 45 days supply | Qty: 90 | Fill #0

## 2018-06-28 MED FILL — HYDROXYZINE PAM 25 MG CAP: 25 | 30 days supply | Qty: 90 | Fill #1

## 2018-07-04 ENCOUNTER — Ambulatory Visit (INDEPENDENT_AMBULATORY_CARE_PROVIDER_SITE_OTHER): Payer: BLUE CROSS/BLUE SHIELD | Admitting: Licensed Clinical Social Worker

## 2018-07-04 ENCOUNTER — Encounter (HOSPITAL_COMMUNITY): Payer: Self-pay | Admitting: Licensed Clinical Social Worker

## 2018-07-04 ENCOUNTER — Other Ambulatory Visit: Payer: Self-pay

## 2018-07-04 DIAGNOSIS — F332 Major depressive disorder, recurrent severe without psychotic features: Secondary | ICD-10-CM | POA: Diagnosis not present

## 2018-07-04 NOTE — Progress Notes (Signed)
Virtual Visit via Phone Note  I connected with Carly Delacruz on 07/04/18 at  1:00 PM EDT by a phone enabled telemedicine application and verified that I am speaking with the correct person using two identifiers. Pt does not have a computer.   I discussed the limitations of evaluation and management by telemedicine and the availability of in person appointments. The patient expressed understanding and agreed to proceed.  History of Present Illness: Pt was referred for therapy by psychiatrist for depression and chronic pain.    Observations/Objective: Pt presented depressed for her individual phone session today. This is the first session since January. Patient continues to have chronic pain and illnesses and put her mental health last. Discussed the importance of her mental health as well as her physical health. Pt chooses to begin weekly appointments again and move forward in gaining mental wellness. Asked open ended questions and used empathic reflection. Answered patients questions and reviewed goals.   Assessment and Plan: Pt is going to begin weekly phone appointments.    Follow Up Instructions:    I discussed the assessment and treatment plan with the patient. The patient was provided an opportunity to ask questions and all were answered. The patient agreed with the plan and demonstrated an understanding of the instructions.   The patient was advised to call back or seek an in-person evaluation if the symptoms worsen or if the condition fails to improve as anticipated.  I provided 60 minutes of non-face-to-face time during this encounter.   Raynell Upton S, LCAS

## 2018-07-10 ENCOUNTER — Ambulatory Visit: Payer: Self-pay | Admitting: General Practice

## 2018-07-10 NOTE — Telephone Encounter (Signed)
Just prior to [employer] the agent connecting me with the pt the pt hung up.  She had called in c/o headache.

## 2018-07-11 ENCOUNTER — Encounter (HOSPITAL_COMMUNITY): Payer: Self-pay | Admitting: Licensed Clinical Social Worker

## 2018-07-11 ENCOUNTER — Other Ambulatory Visit: Payer: Self-pay | Admitting: Cardiovascular Disease

## 2018-07-11 ENCOUNTER — Other Ambulatory Visit: Payer: Self-pay

## 2018-07-11 ENCOUNTER — Ambulatory Visit (INDEPENDENT_AMBULATORY_CARE_PROVIDER_SITE_OTHER): Payer: BLUE CROSS/BLUE SHIELD | Admitting: Licensed Clinical Social Worker

## 2018-07-11 DIAGNOSIS — F332 Major depressive disorder, recurrent severe without psychotic features: Secondary | ICD-10-CM

## 2018-07-11 NOTE — Progress Notes (Signed)
Virtual Visit via Video Note  I connected with Carly Delacruz on 07/11/18 at 10:00 AM EDT by a video enabled telemedicine application and verified that I am speaking with the correct person using two identifiers.    I discussed the limitations of evaluation and management by telemedicine and the availability of in person appointments. The patient expressed understanding and agreed to proceed.  History of Present Illness: Pt was referred for therapy by psychiatrist for depression and chronic pain.    Observations/Objective: Pt presented depressed for her individual video enabled telemedicine session today.Patient presents wnl. "I feel good today." Pt described her psychiatric symptoms and current life events. Pt reported her symptoms have improved within the last week. Used CBT concepts to assist patient to focus on challenging and changing her negative thoughts and cognitive distortions, assisted pt improving her emotional regulation, and helped her develop her coping strategies.        Assessment and Plan: Pt is going to begin weekly video enabled appointments.    Follow Up Instructions:  I discussed the assessment and treatment plan with the patient. The patient was provided an opportunity to ask questions and all were answered. The patient agreed with the plan and demonstrated an understanding of the instructions.   The patient was advised to call back or seek an in-person evaluation if the symptoms worsen or if the condition fails to improve as anticipated.  I provided 60 minutes of non-face-to-face time during this encounter.   Muhannad Bignell S, LCAS

## 2018-07-12 ENCOUNTER — Other Ambulatory Visit: Payer: Self-pay | Admitting: Family

## 2018-07-12 ENCOUNTER — Other Ambulatory Visit: Payer: Self-pay | Admitting: *Deleted

## 2018-07-12 ENCOUNTER — Telehealth: Payer: Self-pay | Admitting: *Deleted

## 2018-07-12 DIAGNOSIS — I10 Essential (primary) hypertension: Secondary | ICD-10-CM

## 2018-07-12 MED ORDER — FUROSEMIDE 20 MG PO TABS: 20.0000 mg | ORAL_TABLET | Freq: Every day | ORAL | 3 refills | Status: DC | PRN

## 2018-07-12 MED FILL — AMITRIPTYLINE HCL 150 MG TA: 150 | 30 days supply | Qty: 30 | Fill #2

## 2018-07-12 MED FILL — LISINOPRIL 10 MG TABLET: 10 | 90 days supply | Qty: 90 | Fill #0

## 2018-07-12 MED FILL — FUROSEMIDE 20 MG TABS: 20 | 30 days supply | Qty: 30 | Fill #0

## 2018-07-12 MED FILL — NUCYNTA 50 MG TABS: 50 | 30 days supply | Qty: 90 | Fill #0

## 2018-07-12 NOTE — Telephone Encounter (Signed)
**Note Carly-Identified via Obfuscation** Pt contacted our office, Carly Delacruz in regards to her refills. I informed pt that these RXs were last filled by Dr Allyson Sabal. Pt states that Dr Allyson Sabal changed Lisinopril to 10 mg. Pt needs refill of Lasix and Lisinopril 10 mg. States her BP Is 132/118 today. Thanks!

## 2018-07-12 NOTE — Telephone Encounter (Signed)
Pt aware refills  sent via Epic for Lisinopril 10 mg and Furosemide 20 mg prn .Carly Delacruz

## 2018-07-12 NOTE — Telephone Encounter (Signed)
I have sent a message to Dr Allyson Sabal, with Cardiology. He last sent this refill in.

## 2018-07-12 NOTE — Telephone Encounter (Signed)
New Message   Patient called previously to get a refill and states the pharmacist said it was denied would like a nurse to call her back.

## 2018-07-12 NOTE — Telephone Encounter (Signed)
Copied from CRM [id number]. Topic: Quick Communication - Rx Refill/Question >> Jul 12, 2018  2:28 PM Jaquita Rector A wrote: Medication: furosemide (LASIX) 20 MG tablet  Has the patient contacted their pharmacy? Yes.   (Agent: If no, request that the patient contact the pharmacy for the refill.) (Agent: If yes, when and what did the pharmacy advise?)  Preferred Pharmacy (with phone number or street name):   Agent: Please be advised that RX refills may take up to 3 business days. We ask that you follow-up with your pharmacy.

## 2018-07-13 DIAGNOSIS — M461 Sacroiliitis, not elsewhere classified: Secondary | ICD-10-CM | POA: Diagnosis not present

## 2018-07-13 DIAGNOSIS — M47816 Spondylosis without myelopathy or radiculopathy, lumbar region: Secondary | ICD-10-CM | POA: Diagnosis not present

## 2018-07-13 DIAGNOSIS — G894 Chronic pain syndrome: Secondary | ICD-10-CM | POA: Diagnosis not present

## 2018-07-13 DIAGNOSIS — G4700 Insomnia, unspecified: Secondary | ICD-10-CM | POA: Diagnosis not present

## 2018-07-18 DIAGNOSIS — F332 Major depressive disorder, recurrent severe without psychotic features: Secondary | ICD-10-CM | POA: Diagnosis not present

## 2018-07-18 MED FILL — AMITRIPTYLINE HCL 75 MG TAB: 75 | 90 days supply | Qty: 90 | Fill #0

## 2018-07-19 ENCOUNTER — Encounter (HOSPITAL_COMMUNITY): Payer: Self-pay | Admitting: Licensed Clinical Social Worker

## 2018-07-19 ENCOUNTER — Ambulatory Visit (INDEPENDENT_AMBULATORY_CARE_PROVIDER_SITE_OTHER): Payer: BLUE CROSS/BLUE SHIELD | Admitting: Licensed Clinical Social Worker

## 2018-07-19 ENCOUNTER — Other Ambulatory Visit: Payer: Self-pay

## 2018-07-19 DIAGNOSIS — F332 Major depressive disorder, recurrent severe without psychotic features: Secondary | ICD-10-CM

## 2018-07-19 NOTE — Progress Notes (Signed)
Virtual Visit via Phone Note  I connected with Carly Delacruz on 07/19/18 at 10:00 AM EDT by a phone enabled telemedicine application and verified that I am speaking with the correct person using two identifiers. Pt does not have a computer.   I discussed the limitations of evaluation and management by telemedicine and the availability of in person appointments. The patient expressed understanding and agreed to proceed.  History of Present Illness: Pt was referred for therapy by psychiatrist for depression and chronic pain.    Observations/Objective: Pt presented depressed for her individual phone session today. She was unable to join via video due to her phone not having internet. She has talked to [employer] about bundling her home phone with internet. Pt was not feeling well. Offered to reschedule but she wanted to continue with the session. Reviewed tx plan with patient and discussed goals. Pt reports she had session with Dr. Rene Kocher, psychiatrist who decreased her Amitriptyline. Patient has been going out for a drive alone about 1x per week for part of her self care. Educated patient on dopamine in the brain. Discussed the importance of scheduling with pt, getting out of bed, getting dressed, practicing self care on daily basis. Discussed her current depressive symptoms.    Assessment and Plan: Continue seeing patient weekly phone appointments.    Follow Up Instructions:    I discussed the assessment and treatment plan with the patient. The patient was provided an opportunity to ask questions and all were answered. The patient agreed with the plan and demonstrated an understanding of the instructions.   The patient was advised to call back or seek an in-person evaluation if the symptoms worsen or if the condition fails to improve as anticipated.  I provided 60 minutes of non-face-to-face time during this encounter.   Latessa Tillis S, LCAS

## 2018-07-24 MED FILL — FLUoxetine HCL 40 MG CAPS: 40 | 90 days supply | Qty: 90 | Fill #0

## 2018-08-03 ENCOUNTER — Other Ambulatory Visit: Payer: Self-pay

## 2018-08-03 ENCOUNTER — Ambulatory Visit (INDEPENDENT_AMBULATORY_CARE_PROVIDER_SITE_OTHER): Payer: BC Managed Care – PPO | Admitting: Licensed Clinical Social Worker

## 2018-08-03 ENCOUNTER — Encounter (HOSPITAL_COMMUNITY): Payer: Self-pay | Admitting: Licensed Clinical Social Worker

## 2018-08-03 DIAGNOSIS — F332 Major depressive disorder, recurrent severe without psychotic features: Secondary | ICD-10-CM | POA: Diagnosis not present

## 2018-08-03 NOTE — Progress Notes (Signed)
Virtual Visit via Phone Note  I connected with Carly Delacruz on 08/03/18 at 10:00 AM EDT by a phone enabled telemedicine application and verified that I am speaking with the correct person using two identifiers. Pt does not have a computer.   I discussed the limitations of evaluation and management by telemedicine and the availability of in person appointments. The patient expressed understanding and agreed to proceed.  History of Present Illness: Pt was referred for therapy by psychiatrist for depression and chronic pain.    Observations/Objective: Pt presented depressed for her individual phone session today. She was unable to join via video due to her phone not having internet. "I'm struggling with the continued pandemic and unrest in the world. I tried going out with my mask but it frightens me when I see other people in masks." Asked open ended questions. Asked open ended questions and used empathic reflection. Pt is watching the news daily but it's causing more stress. Assisted patient with identifying her stressors and calming skills. Discussed with patient if she wants to come into the office for sessions instead of webex. "I definitely do not want to come into the office, it's too early."    Assessment and Plan: Continue seeing patient weekly phone appointments.    Follow Up Instructions:    I discussed the assessment and treatment plan with the patient. The patient was provided an opportunity to ask questions and all were answered. The patient agreed with the plan and demonstrated an understanding of the instructions.   The patient was advised to call back or seek an in-person evaluation if the symptoms worsen or if the condition fails to improve as anticipated.  I provided 60 minutes of non-face-to-face time during this encounter.   [name],[name], LCAS

## 2018-08-04 DIAGNOSIS — H40013 Open angle with borderline findings, low risk, bilateral: Secondary | ICD-10-CM | POA: Diagnosis not present

## 2018-08-08 DIAGNOSIS — M7061 Trochanteric bursitis, right hip: Secondary | ICD-10-CM | POA: Diagnosis not present

## 2018-08-08 MED FILL — tiZANidine HCL 4 MG TABS: 4 | 30 days supply | Qty: 90 | Fill #1

## 2018-08-08 MED FILL — HYDROXYZINE PAMOATE 25 MG C: 25 | 30 days supply | Qty: 90 | Fill #2

## 2018-08-08 MED FILL — diazePAM 5 MG TABS: 5 | 16 days supply | Qty: 50 | Fill #1

## 2018-08-08 MED FILL — NUCYNTA 50 MG TABS: 50 | 30 days supply | Qty: 90 | Fill #0

## 2018-08-08 MED FILL — QUETIAPINE 25 MG TABLET: 25 | 45 days supply | Qty: 90 | Fill #0

## 2018-08-09 ENCOUNTER — Ambulatory Visit (INDEPENDENT_AMBULATORY_CARE_PROVIDER_SITE_OTHER): Payer: BC Managed Care – PPO | Admitting: Licensed Clinical Social Worker

## 2018-08-09 ENCOUNTER — Encounter (HOSPITAL_COMMUNITY): Payer: Self-pay | Admitting: Licensed Clinical Social Worker

## 2018-08-09 ENCOUNTER — Other Ambulatory Visit: Payer: Self-pay

## 2018-08-09 DIAGNOSIS — F332 Major depressive disorder, recurrent severe without psychotic features: Secondary | ICD-10-CM

## 2018-08-09 NOTE — Progress Notes (Signed)
Virtual Visit via Video Note  I connected with Carly Delacruz on 08/09/18 at 12:30 PM EDT by a video enabled telemedicine application and verified that I am speaking with the correct person using two identifiers.    I discussed the limitations of evaluation and management by telemedicine and the availability of in person appointments. The patient expressed understanding and agreed to proceed.  History of Present Illness: Pt was referred for therapy by psychiatrist for depression and chronic pain.    Observations/Objective: Pt presented depressed for her individual webex session today.  Pt discussed her psychiatric symptoms and current life events. "I tried all weekend trying to figure out Webex and called [facility] to get someone to help me and now I'm on on Webex!" Congratulated pt on her perseverance, which paid off. Discussed her current stressors: chronic pain, memory issues, relationship with mother who lives with her. Today assisted with strategies for memory issues.   Assessment and Plan: Continue seeing patient weekly webex appointments.   Follow Up Instructions:   I discussed the assessment and treatment plan with the patient. The patient was provided an opportunity to ask questions and all were answered. The patient agreed with the plan and demonstrated an understanding of the instructions.   The patient was advised to call back or seek an in-person evaluation if the symptoms worsen or if the condition fails to improve as anticipated.  I provided 60 minutes of non-face-to-face time during this encounter.   Nikko Goldwire S, LCAS

## 2018-08-14 DIAGNOSIS — G4700 Insomnia, unspecified: Secondary | ICD-10-CM | POA: Diagnosis not present

## 2018-08-14 DIAGNOSIS — M461 Sacroiliitis, not elsewhere classified: Secondary | ICD-10-CM | POA: Diagnosis not present

## 2018-08-14 DIAGNOSIS — M47816 Spondylosis without myelopathy or radiculopathy, lumbar region: Secondary | ICD-10-CM | POA: Diagnosis not present

## 2018-08-14 DIAGNOSIS — G894 Chronic pain syndrome: Secondary | ICD-10-CM | POA: Diagnosis not present

## 2018-08-14 DIAGNOSIS — Z79891 Long term (current) use of opiate analgesic: Secondary | ICD-10-CM | POA: Diagnosis not present

## 2018-08-14 MED FILL — METHOCARBAMOL 500 MG TABLET: 500 | 30 days supply | Qty: 90 | Fill #0

## 2018-08-14 MED FILL — GABAPENTIN 300 MG CAPSULE: 300 | 90 days supply | Qty: 270 | Fill #0

## 2018-08-17 ENCOUNTER — Ambulatory Visit (INDEPENDENT_AMBULATORY_CARE_PROVIDER_SITE_OTHER): Payer: BC Managed Care – PPO | Admitting: Licensed Clinical Social Worker

## 2018-08-17 ENCOUNTER — Other Ambulatory Visit: Payer: Self-pay

## 2018-08-17 ENCOUNTER — Encounter (HOSPITAL_COMMUNITY): Payer: Self-pay | Admitting: Licensed Clinical Social Worker

## 2018-08-17 ENCOUNTER — Telehealth (HOSPITAL_COMMUNITY): Payer: Self-pay | Admitting: Psychiatry

## 2018-08-17 DIAGNOSIS — F332 Major depressive disorder, recurrent severe without psychotic features: Secondary | ICD-10-CM

## 2018-08-17 NOTE — Telephone Encounter (Signed)
D:  Pt's therapist [facility] Noah Delaine, [employer]) referred pt to [facility].  A:  Placed call to pt to orient and provide a start date.  Pt states she would like to contact her insurance company to verify her benefits first.  Encouraged pt to call writer back whenever she calls them.  R:  Pt receptive.

## 2018-08-17 NOTE — Progress Notes (Signed)
Virtual Visit via Video Note  I connected with Carly Delacruz on 08/17/18 at  9:00 AM EDT by a video enabled telemedicine application and verified that I am speaking with the correct person using two identifiers.    I discussed the limitations of evaluation and management by telemedicine and the availability of in person appointments. The patient expressed understanding and agreed to proceed.  History of Present Illness: Pt was referred for therapy by psychiatrist for depression and chronic pain.    Observations/Objective: Pt presented depressed for her individual webex session today.  Pt discussed her psychiatric symptoms and current life. Pt reports at her last appointment with Dr. Daron Offer he suggested [facility]. Suggested she read the informational sheet he gave her. Patient inquired about IOP program. I think it's a good idea, to give her a purpose and be around like-minded people. The intense program will give her a place to process and learn coping skills. Pt is motivated. Will have CM Rita call patient to begin the process of IOP.  Pt is concerned about her shopping "addiction." Discussed steps of realizing you're over shopping: Reflect on how you feel when you shop. Do certain emotional triggers frequently prompt you to shop?  Start writing things down and only buy what's on your list. Carly Delacruz clear of unnecesstary temptations. Stay off the Internet Give her mother her debit card Discussed [place] program. She thinks the program may be offered at her church.  Assessment and Plan: Refer to IOP. After completion of IOP continue seeing patient weekly webex appointments.   Follow Up Instructions:   I discussed the assessment and treatment plan with the patient. The patient was provided an opportunity to ask questions and all were answered. The patient agreed with the plan and demonstrated an understanding of the instructions.   The patient was advised to call back or seek an in-person  evaluation if the symptoms worsen or if the condition fails to improve as anticipated.  I provided 60 minutes of non-face-to-face time during this encounter.   [name],[name], LCAS

## 2018-08-21 DIAGNOSIS — M25562 Pain in left knee: Secondary | ICD-10-CM | POA: Diagnosis not present

## 2018-08-21 DIAGNOSIS — M1712 Unilateral primary osteoarthritis, left knee: Secondary | ICD-10-CM | POA: Diagnosis not present

## 2018-08-23 ENCOUNTER — Telehealth: Payer: Self-pay | Admitting: Family

## 2018-08-23 NOTE — Telephone Encounter (Signed)
Mickel Baas would you accept this patient as a TOC?  Copied from [place] [id number]. Topic: Appointment Scheduling - Scheduling Inquiry for Clinic >> Aug 22, 2018  4:09 PM Nils Flack wrote: Reason for CRM: pt needs a transfer of care appt with another provider. Please call [phone]

## 2018-08-24 ENCOUNTER — Other Ambulatory Visit: Payer: Self-pay

## 2018-08-24 ENCOUNTER — Ambulatory Visit (INDEPENDENT_AMBULATORY_CARE_PROVIDER_SITE_OTHER): Payer: BC Managed Care – PPO | Admitting: Licensed Clinical Social Worker

## 2018-08-24 ENCOUNTER — Encounter (HOSPITAL_COMMUNITY): Payer: Self-pay | Admitting: Licensed Clinical Social Worker

## 2018-08-24 DIAGNOSIS — F332 Major depressive disorder, recurrent severe without psychotic features: Secondary | ICD-10-CM | POA: Diagnosis not present

## 2018-08-24 NOTE — Progress Notes (Signed)
Virtual Visit via Phone Note  I connected with Olegario Messier on 08/24/18 at  2:30 PM EDT by a phone enabled telemedicine application and verified that I am speaking with the correct person using two identifiers. Patient was unable to use webex today.   I discussed the limitations of evaluation and management by telemedicine and the availability of in person appointments. The patient expressed understanding and agreed to proceed.  History of Present Illness: Pt was referred for therapy by psychiatrist for depression and chronic pain.    Observations/Objective: Pt presented depressed for her individual phone session today.  Pt discussed her psychiatric symptoms and current life. Patient was in bed, asleep, when I called. She had been to the dr for her knee about knee replacement surgery. Her doctor will not perform surgery until she loses weight. Pt was concerned about how to lose weight when she can't exercise. Gave patient information on [facility] and discussed the plan with her. Patient asked questions. Patient is still considering the IOP program and had some questions Suggested to call CM Velva Harman with questions. Processed with patient the benefits of IOP suggesting she follow through.  PLAN: Ask patient about [place] program. She thinks the program may be offered at her church.  Assessment and Plan: Refer to IOP. After completion of IOP continue seeing patient weekly webex appointments.   Follow Up Instructions:   I discussed the assessment and treatment plan with the patient. The patient was provided an opportunity to ask questions and all were answered. The patient agreed with the plan and demonstrated an understanding of the instructions.   The patient was advised to call back or seek an in-person evaluation if the symptoms worsen or if the condition fails to improve as anticipated.  I provided 60 minutes of non-face-to-face time during this  encounter.   [name],[name], LCAS

## 2018-08-29 DIAGNOSIS — H40013 Open angle with borderline findings, low risk, bilateral: Secondary | ICD-10-CM | POA: Diagnosis not present

## 2018-08-29 MED FILL — POTASSIUM CHL ER M10 TABLET: 10 | 60 days supply | Qty: 60 | Fill #1

## 2018-08-29 NOTE — Telephone Encounter (Signed)
I will work with her until we hire a replacement for [employer]. I think her needs are going to be better served by a full time provider.

## 2018-08-29 NOTE — Telephone Encounter (Signed)
Dr.John would you be willing to accept this patient?  °

## 2018-08-29 NOTE — Telephone Encounter (Signed)
Very sorry, but I am unable to accept this pt at this time, thanks

## 2018-08-29 NOTE — Telephone Encounter (Signed)
Patient is calling to check request status on [place] taking on patient. Call back # [phone]

## 2018-08-30 NOTE — Telephone Encounter (Signed)
LVM to inform patient we are unable to get her set up at this office at this time. We would like to get her set up at one of our other locations with a MD. Please offer patient one of the other offices.

## 2018-08-31 DIAGNOSIS — Z1231 Encounter for screening mammogram for malignant neoplasm of breast: Secondary | ICD-10-CM | POA: Diagnosis not present

## 2018-08-31 DIAGNOSIS — Z1211 Encounter for screening for malignant neoplasm of colon: Secondary | ICD-10-CM | POA: Diagnosis not present

## 2018-08-31 DIAGNOSIS — M8580 Other specified disorders of bone density and structure, unspecified site: Secondary | ICD-10-CM | POA: Insufficient documentation

## 2018-08-31 DIAGNOSIS — M810 Age-related osteoporosis without current pathological fracture: Secondary | ICD-10-CM | POA: Diagnosis not present

## 2018-08-31 DIAGNOSIS — Z01419 Encounter for gynecological examination (general) (routine) without abnormal findings: Secondary | ICD-10-CM | POA: Diagnosis not present

## 2018-08-31 MED FILL — IBANDRONATE SODIUM 150 MG T: 150 | 84 days supply | Qty: 3 | Fill #0

## 2018-09-04 MED FILL — VITAMIN D3 50000 UNIT CAPS: 1.25 MG | 56 days supply | Qty: 8 | Fill #0

## 2018-09-05 ENCOUNTER — Ambulatory Visit: Payer: Self-pay

## 2018-09-05 NOTE — Telephone Encounter (Signed)
Pt. called to report she has had cough for about a week.  Stated the cough is dry.  Also stated it feels deep.  Also c/o sore throat and fatigue.  Reported intermittent chills.  Stated she has some shortness of breath when she lays down.  Pt. denied any known exposure to COVID - positive person.   Reviewed care advice per protocol.  Advised to call back if symptoms worsen. Transferred pt. To [facility], to establish care with a new provider.  Agreed with plan.    Reason for Disposition . [1] COVID-19 infection suspected by caller or triager AND [2] mild symptoms (cough, fever, or others) AND [7] no complications or SOB  Answer Assessment - Initial Assessment Questions 1. COVID-19 DIAGNOSIS: "Who made your Coronavirus (COVID-19) diagnosis?" "Was it confirmed by a positive lab test?" If not diagnosed by a HCP, ask "Are there lots of cases (community spread) where you live?" (See public health department website, if unsure)     Unknown if exposed;  2. ONSET: "When did the COVID-19 symptoms start?"      One week ago  3. WORST SYMPTOM: "What is your worst symptom?" (e.g., cough, fever, shortness of breath, muscle aches)     cough 4. COUGH: "Do you have a cough?" If so, ask: "How bad is the cough?"       Cough is dry 5. FEVER: "Do you have a fever?" If so, ask: "What is your temperature, how was it measured, and when did it start?"     Chills  6. RESPIRATORY STATUS: "Describe your breathing?" (e.g., shortness of breath, wheezing, unable to speak)      Some shortness of breath when she lays down  7. BETTER-SAME-WORSE: "Are you getting better, staying the same or getting worse compared to yesterday?"  If getting worse, ask, "In what way?"     worse 8. HIGH RISK DISEASE: "Do you have any chronic medical problems?" (e.g., asthma, heart or lung disease, weak immune system, etc.)     HTN, chronic pain syndrome 9. PREGNANCY: "Is there any chance you are pregnant?" "When was your last menstrual period?"  N/a  10. OTHER SYMPTOMS: "Do you have any other symptoms?"  (e.g., chills, fatigue, headache, loss of smell or taste, muscle pain, sore throat)       C/o sore throat, cough, chills  Protocols used: CORONAVIRUS (COVID-19) DIAGNOSED OR SUSPECTED-A-AH

## 2018-09-07 MED FILL — tiZANidine HCL 4 MG TABS: 4 | 30 days supply | Qty: 90 | Fill #2

## 2018-09-07 MED FILL — diazePAM 5 MG TABS: 5 | 16 days supply | Qty: 50 | Fill #2

## 2018-09-07 MED FILL — CARVEDILOL 6.25 MG TABLET: 6.25 | 90 days supply | Qty: 180 | Fill #1

## 2018-09-07 MED FILL — NUCYNTA 50 MG TABS: 50 | 30 days supply | Qty: 90 | Fill #0

## 2018-09-07 MED FILL — SM ACID REDUCER 20 MG TAB: 20 | 25 days supply | Qty: 50 | Fill #2

## 2018-09-11 ENCOUNTER — Ambulatory Visit (HOSPITAL_COMMUNITY): Payer: BC Managed Care – PPO | Admitting: Licensed Clinical Social Worker

## 2018-09-12 MED FILL — HYDROXYZINE PAMOATE 25 MG C: 25 | 90 days supply | Qty: 270 | Fill #0

## 2018-09-14 ENCOUNTER — Other Ambulatory Visit: Payer: Self-pay

## 2018-09-14 ENCOUNTER — Ambulatory Visit: Payer: BC Managed Care – PPO | Admitting: Emergency Medicine

## 2018-09-14 DIAGNOSIS — F332 Major depressive disorder, recurrent severe without psychotic features: Secondary | ICD-10-CM | POA: Diagnosis not present

## 2018-09-14 MED FILL — AMITRIPTYLINE HCL 150 MG TA: 150 | 90 days supply | Qty: 90 | Fill #0

## 2018-09-14 MED FILL — QUETIAPINE 25 MG TABLET: 25 | 90 days supply | Qty: 180 | Fill #0

## 2018-09-18 ENCOUNTER — Ambulatory Visit (INDEPENDENT_AMBULATORY_CARE_PROVIDER_SITE_OTHER): Payer: BC Managed Care – PPO | Admitting: Licensed Clinical Social Worker

## 2018-09-18 ENCOUNTER — Encounter (HOSPITAL_COMMUNITY): Payer: Self-pay | Admitting: Licensed Clinical Social Worker

## 2018-09-18 DIAGNOSIS — F332 Major depressive disorder, recurrent severe without psychotic features: Secondary | ICD-10-CM | POA: Diagnosis not present

## 2018-09-18 NOTE — Progress Notes (Signed)
Virtual Visit via Phone Note  I connected with Carly Delacruz on 09/18/18 at  1:00 PM EDT by a phone enabled telemedicine application and verified that I am speaking with the correct person using two identifiers. Patient was unable to use webex today.   I discussed the limitations of evaluation and management by telemedicine and the availability of in person appointments. The patient expressed understanding and agreed to proceed.  History of Present Illness: Pt was referred for therapy by psychiatrist for depression and chronic pain.    Observations/Objective: Pt presented in pain for her individual phone session today.  Pt discussed her psychiatric symptoms and current life. Patient was in bed due to pain when I called. She wanted a limited session to to her increase in pain.  She spoke to her psychiatrist who put her back on her amytriptiline, which assists her with sleep. Pt reports she is experiencing some covid-19 symptoms. Asked open ended questions. Pt has a new PCP who she has not met yet. Clinician called [employer] and [place] to inquire about covid-19 testing sites. Relayed information to pt. Pt's elderly mother lives with her and goes out quite often. Suggested to pt she take her mother with her to get tested. Pt does not have another appointment scheduled so suggested to pt to call the front desk to make another followup appointment when she feels better. Pt was in agreement.    .  Assessment and Plan: Refer to IOP?? After completion of IOP continue seeing patient weekly webex appointments.   Follow Up Instructions:   I discussed the assessment and treatment plan with the patient. The patient was provided an opportunity to ask questions and all were answered. The patient agreed with the plan and demonstrated an understanding of the instructions.   The patient was advised to call back or seek an in-person evaluation if the symptoms worsen or if the condition fails to improve as  anticipated.  I provided 25 minutes of non-face-to-face time during this encounter.   [name],[name], LCAS

## 2018-09-19 ENCOUNTER — Other Ambulatory Visit: Payer: Self-pay

## 2018-09-19 ENCOUNTER — Telehealth: Payer: BC Managed Care – PPO | Admitting: Emergency Medicine

## 2018-09-19 ENCOUNTER — Encounter: Payer: Self-pay | Admitting: Emergency Medicine

## 2018-09-19 ENCOUNTER — Telehealth (INDEPENDENT_AMBULATORY_CARE_PROVIDER_SITE_OTHER): Payer: BC Managed Care – PPO | Admitting: Emergency Medicine

## 2018-09-19 DIAGNOSIS — B9789 Other viral agents as the cause of diseases classified elsewhere: Secondary | ICD-10-CM | POA: Diagnosis not present

## 2018-09-19 DIAGNOSIS — Z20822 Contact with and (suspected) exposure to covid-19: Secondary | ICD-10-CM

## 2018-09-19 DIAGNOSIS — R05 Cough: Secondary | ICD-10-CM

## 2018-09-19 DIAGNOSIS — R059 Cough, unspecified: Secondary | ICD-10-CM

## 2018-09-19 DIAGNOSIS — R6889 Other general symptoms and signs: Secondary | ICD-10-CM | POA: Diagnosis not present

## 2018-09-19 DIAGNOSIS — J988 Other specified respiratory disorders: Secondary | ICD-10-CM

## 2018-09-19 NOTE — Progress Notes (Signed)
CC: Pt states that she has had a cough on and off since May. The cough is worst at night. Pt states she has tied over the counter medication. See depression screening.

## 2018-09-19 NOTE — Progress Notes (Signed)
Telemedicine Encounter- SOAP NOTE Established Patient  This telephone encounter was conducted with the patient's (or proxy's) verbal consent via audio telecommunications: yes/no: Yes Patient was instructed to have this encounter in a suitably private space; and to only have persons present to whom they give permission to participate. In addition, patient identity was confirmed by use of name plus two identifiers (DOB and address).  I discussed the limitations, risks, security and privacy concerns of performing an evaluation and management service by telephone and the availability of in person appointments. I also discussed with the patient that there may be a patient responsible charge related to this service. The patient expressed understanding and agreed to proceed.  I spent a total of TIME; 0 MIN TO 60 MIN: 15 minutes talking with the patient or their proxy.  No chief complaint on file. Dry cough for several weeks  Subjective   Carly Delacruz is a 53 y.o. female patient.  First visit with this clinic.  Telephone visit today to establish care and consult about dry cough that started about 5 weeks ago with some runny nose.  Cough is mostly at night and not associated with difficulty breathing or wheezing.  Denies fever or chills.  Lives with mother at home and they are both scheduled to get tested for COVID tomorrow. Patient [facility] but her primary care doctor left the practice and other doctors are not taking new patients.  Patient looking for no primary care doctor. Patient has multiple chronic medical problems including chronic pain and psychiatric condition. Patient understands that I will not be accepting to be her primary care physician until I can formally meet with her in the office and go over her medical history and medications with her.  We need to be clear on what will I be able to chronically handle as well as what I will not  be able to care for.  She understands. I will be able to see her in the office once we get COVID results and patient is symptom-free.  HPI   Patient Active Problem List   Diagnosis Date Noted  . Other chronic pain 02/01/2018  . Lower extremity edema 12/21/2017  . Depression 05/08/2017  . Glaucoma 05/02/2017  . Chronic back pain 08/12/2016  . Anemia 08/12/2016  . H/O bladder infections 08/12/2016  . S/P gastric bypass 08/12/2016  . Polypharmacy   . Sleep-disordered breathing 07/26/2014  . GERD (gastroesophageal reflux disease) 06/15/2014  . Neuropathy 06/15/2014  . Essential hypertension 06/15/2014  . Obesity 05/08/2013  . Morbid obesity (HCC) 03/02/2003    Past Medical History:  Diagnosis Date  . Allergy   . Anemia   . Anxiety   . Arthritis 12/10/03  . Atrophic vaginitis 2009  . BV (bacterial vaginosis) 2006  . Chronic back pain   . Chronic LLQ pain   . Chronic post-traumatic stress disorder (PTSD)   . Complication of anesthesia   . Elevated LFTs   . Endometriosis   . Fatigue 2012  . Gallstones   . GERD (gastroesophageal reflux disease)   . H/O bladder infections   . H/O mumps   . H/O varicella   . History of candidal vulvovaginitis 03/21/03  . HTN (hypertension)   . Insomnia   . Insomnia secondary to situational depression 06/06/07  . Liver mass, right lobe 08/13/04   Nodule  . Malaise 2012  . Morbid obesity (HCC) 2005  . Osteoporosis   .  S/P gastric bypass   . Severe episode of recurrent major depressive disorder, without psychotic features (HCC)   . Sleep apnea 2005  . SUI (stress urinary incontinence, female) 08/12/03  . Trichomonas   . Vulvar lesion 03/21/03  . Yeast infection     Current Outpatient Medications  Medication Sig Dispense Refill  . amitriptyline (ELAVIL) 150 MG tablet Take 1 tablet (150 mg total) by mouth at bedtime. 90 tablet 0  . carvedilol (COREG) 6.25 MG tablet Take 1 tablet (6.25 mg total) by mouth 2 (two) times daily. 180 tablet 1   . diazepam (VALIUM) 5 MG tablet Place 5 mg vaginally at bedtime.     . famotidine (PEPCID) 20 MG tablet TAKE 1 TABLET BY MOUTH TWICE DAILY 60 tablet 3  . FLUoxetine (PROZAC) 40 MG capsule Take 1 capsule (40 mg total) by mouth daily. 90 capsule 0  . furosemide (LASIX) 20 MG tablet Take 1 tablet (20 mg total) by mouth daily as needed for fluid or edema. 30 tablet 3  . gabapentin (NEURONTIN) 300 MG capsule Take 1 capsule (300 mg total) by mouth 2 (two) times daily.    . hydrOXYzine (VISTARIL) 25 MG capsule Take 25 mg by mouth as needed.  2  . lisinopril (ZESTRIL) 10 MG tablet Take 1 tablet (10 mg total) by mouth daily. 90 tablet 3  . potassium chloride (K-DUR,KLOR-CON) 10 MEQ tablet Take 1 tablet (10 mEq total) by mouth daily. As directed with lasix 60 tablet 1  . QUEtiapine (SEROQUEL) 25 MG tablet Take 25 mg by mouth at bedtime.    . tapentadol (NUCYNTA) 50 MG tablet Take 50 mg by mouth 3 (three) times daily.    Marland Kitchen. tiZANidine (ZANAFLEX) 4 MG tablet Take 1 tablet (4 mg total) by mouth 2 (two) times daily. 30 tablet 0   No current facility-administered medications for this visit.     Allergies  Allergen Reactions  . Morphine And Related Shortness Of Breath and Itching  . Septra [Sulfamethoxazole-Trimethoprim] Anaphylaxis and Hives  . Sulfa Antibiotics Anaphylaxis, Hives and Swelling    Throat swelling  . Sulfamethoxazole-Trimethoprim Anaphylaxis and Hives  . Sulfasalazine Anaphylaxis, Hives, Swelling and Other (See Comments)    Throat swelling  . Ultram [Tramadol] Shortness Of Breath, Anxiety and Other (See Comments)    "panic attacks"   . Ciprofloxacin Other (See Comments)    This medication was given along with a sulfa. Reaction is unknown at this time. Must list due to possible allergy.  Leodis [place]. Darvocet [Propoxyphene N-Acetaminophen] Itching  . Adhesive [Tape] Rash  . Codeine Itching and Other (See Comments)    No change when benadryl is given  . Demerol [Meperidine] Nausea Only  .  Hydromorphone Itching and Other (See Comments)    No change when benadryl is given  . Losartan Palpitations    Social History   Socioeconomic History  . Marital status: Single    Spouse name: Not on file  . Number of children: 0  . Years of education: Not on file  . Highest education level: Bachelor's degree (e.g., BA, AB, BS)  Occupational History  . Occupation: DISABILITY  Social Needs  . Financial resource strain: Not on file  . Food insecurity    Worry: Not on file    Inability: Not on file  . Transportation needs    Medical: Not on file    Non-medical: Not on file  Tobacco Use  . Smoking status: Never Smoker  . Smokeless tobacco: Never Used  Substance and Sexual Activity  . Alcohol use: No  . Drug use: No  . Sexual activity: Never    Birth control/protection: Surgical    Comment: hysterectomy   Lifestyle  . Physical activity    Days per week: Not on file    Minutes per session: Not on file  . Stress: Not on file  Relationships  . Social Herbalist on phone: Not on file    Gets together: Not on file    Attends religious service: Not on file    Active member of club or organization: Not on file    Attends meetings of clubs or organizations: Not on file    Relationship status: Not on file  . Intimate partner violence    Fear of current or ex partner: Not on file    Emotionally abused: Not on file    Physically abused: Not on file    Forced sexual activity: Not on file  Other Topics Concern  . Not on file  Social History Narrative   Patient is singel. Her mother is living with her in a one level house, 2 steps to enter.     Review of Systems  Constitutional: Negative for chills and fever.  HENT: Positive for congestion. Negative for ear pain and sore throat.   Eyes: Negative.   Respiratory: Positive for cough. Negative for shortness of breath.   Cardiovascular: Negative for chest pain and palpitations.  Gastrointestinal: Negative for abdominal  pain, diarrhea, nausea and vomiting.  Genitourinary: Negative.  Negative for dysuria.  Musculoskeletal: Negative.  Negative for back pain, myalgias and neck pain.  Skin: Negative.  Negative for rash.  Neurological: Negative for dizziness and headaches.  All other systems reviewed and are negative.   Objective  Alert and oriented x3 in no apparent respiratory distress vitals as reported by the patient: There were no vitals filed for this visit.  There are no diagnoses linked to this encounter.  Diagnoses and all orders for this visit:  Cough  Viral respiratory illness  Suspected Covid-19 Virus Infection    Clinically stable.  No red flag signs or symptoms.  Over-the-counter cough medicine recommended as well as increase fluid intake. Possible COVID infection.  Both patient and her mother will be tested tomorrow. Follow-up in the office once COVID results are known and patient is symptom-free.   I discussed the assessment and treatment plan with the patient. The patient was provided an opportunity to ask questions and all were answered. The patient agreed with the plan and demonstrated an understanding of the instructions.   The patient was advised to call back or seek an in-person evaluation if the symptoms worsen or if the condition fails to improve as anticipated.  I provided 15 minutes of non-face-to-face time during this encounter.  Horald Pollen, MD  Primary Care at [facility]

## 2018-09-28 ENCOUNTER — Telehealth: Payer: Self-pay | Admitting: Emergency Medicine

## 2018-09-28 NOTE — Telephone Encounter (Signed)
Pt saw Dr. Mitchel Honour on 09/19/2018. The provider told her to take Zyrtec, she has and has no relief. Wanting something prescribed so that she can get some relief until she is able to see her ENT.

## 2018-09-28 NOTE — Telephone Encounter (Signed)
Mucinex DM over-the-counter and Benadryl as needed.  Thanks.

## 2018-09-28 NOTE — Telephone Encounter (Signed)
Pt is asking for another medication, the zyrtec is not working

## 2018-10-02 ENCOUNTER — Other Ambulatory Visit: Payer: Self-pay

## 2018-10-02 NOTE — Telephone Encounter (Signed)
Pt telephone rings and gives busy signal

## 2018-10-03 ENCOUNTER — Ambulatory Visit (INDEPENDENT_AMBULATORY_CARE_PROVIDER_SITE_OTHER): Payer: BC Managed Care – PPO | Admitting: Licensed Clinical Social Worker

## 2018-10-03 ENCOUNTER — Encounter (HOSPITAL_COMMUNITY): Payer: Self-pay | Admitting: Licensed Clinical Social Worker

## 2018-10-03 ENCOUNTER — Other Ambulatory Visit: Payer: Self-pay

## 2018-10-03 DIAGNOSIS — F332 Major depressive disorder, recurrent severe without psychotic features: Secondary | ICD-10-CM | POA: Diagnosis not present

## 2018-10-03 NOTE — Progress Notes (Signed)
Virtual Visit via Phone Note  I connected with Olegario Messier on 10/03/18 at  4:00 PM EDT by a phone enabled telemedicine application and verified that I am speaking with the correct person using two identifiers. Patient was unable to use webex today.   I discussed the limitations of evaluation and management by telemedicine and the availability of in person appointments. The patient expressed understanding and agreed to proceed.  History of Present Illness: Pt was referred for therapy by psychiatrist for depression and chronic pain.    Observations/Objective: Pt presented in  for her individual phone session sick today. Pt discussed her psychiatric symptoms and current life. Patient was in bed sick with cough and sore throat. She has been trying to get a covid test. She is very discouraged. She no longer has a PCP and is looking for a new PCP. Discussed options with pt. Researched covid testing sites and gave pt information. Pt asked help on using boundaries with her  Family. Role played boundaries with family. Assessed family dynamics and discussed with pt.     Assessment and Plan: Refer to IOP?? After completion of IOP continue seeing patient weekly webex appointments.   Follow Up Instructions:   I discussed the assessment and treatment plan with the patient. The patient was provided an opportunity to ask questions and all were answered. The patient agreed with the plan and demonstrated an understanding of the instructions.   The patient was advised to call back or seek an in-person evaluation if the symptoms worsen or if the condition fails to improve as anticipated.  I provided 25 minutes of non-face-to-face time during this encounter.   [name],[name], LCAS

## 2018-10-04 ENCOUNTER — Telehealth (INDEPENDENT_AMBULATORY_CARE_PROVIDER_SITE_OTHER): Payer: BC Managed Care – PPO | Admitting: Family Medicine

## 2018-10-04 DIAGNOSIS — Z20828 Contact with and (suspected) exposure to other viral communicable diseases: Secondary | ICD-10-CM

## 2018-10-04 DIAGNOSIS — R05 Cough: Secondary | ICD-10-CM

## 2018-10-04 DIAGNOSIS — Z20822 Contact with and (suspected) exposure to covid-19: Secondary | ICD-10-CM

## 2018-10-04 DIAGNOSIS — R0982 Postnasal drip: Secondary | ICD-10-CM

## 2018-10-04 DIAGNOSIS — R059 Cough, unspecified: Secondary | ICD-10-CM

## 2018-10-04 MED ORDER — FLUTICASONE PROPIONATE 50 MCG/ACT NA SUSP: 1.0000 | Freq: Every day | NASAL | 6 refills | Status: DC

## 2018-10-04 MED FILL — FLUTICASONE PROP 50 MCG SPR: 50 | 30 days supply | Qty: 16 | Fill #0

## 2018-10-04 NOTE — Patient Instructions (Signed)
Ok to continue zyrtec once per day for allergies, also start flonase to see if that helps cough. Recheck in 2 weeks for cough, Return to the clinic or go to the nearest emergency room if any of your symptoms worsen or new symptoms occur.  Covid test ordered for [employer]. Can have that testing done tomorrow as drive up test.    Cough, Adult Coughing is a reflex that clears your throat and your airways (respiratory system). Coughing helps to heal and protect your lungs. It is normal to cough occasionally, but a cough that happens with other symptoms or lasts a long time may be a sign of a condition that needs treatment. An acute cough may only last 2-3 weeks, while a chronic cough may last 8 or more weeks. Coughing is commonly caused by:  Infection of the respiratory systemby viruses or bacteria.  Breathing in substances that irritate your lungs.  Allergies.  Asthma.  Mucus that runs down the back of your throat (postnasal drip).  Smoking.  Acid backing up from the stomach into the esophagus (gastroesophageal reflux).  Certain medicines.  Chronic lung problems.  Other medical conditions such as heart failure or a blood clot in the lung (pulmonary embolism). Follow these instructions at home: Medicines  Take over-the-counter and prescription medicines only as told by your health care provider.  Talk with your health care provider before you take a cough suppressant medicine. Lifestyle   Avoid cigarette smoke. Do not use any products that contain nicotine or tobacco, such as cigarettes, e-cigarettes, and chewing tobacco. If you need help quitting, ask your health care provider.  Drink enough fluid to keep your urine pale yellow.  Avoid caffeine.  Do not drink alcohol if your health care provider tells you not to drink. General instructions   Pay close attention to changes in your cough. Tell your health care provider about them.  Always cover your mouth when  you cough.  Avoid things that make you cough, such as perfume, candles, cleaning products, or campfire or tobacco smoke.  If the air is dry, use a cool mist vaporizer or humidifier in your bedroom or your home to help loosen secretions.  If your cough is worse at night, try to sleep in a semi-upright position.  Rest as needed.  Keep all follow-up visits as told by your health care provider. This is important. Contact a health care provider if you:  Have new symptoms.  Cough up pus.  Have a cough that does not get better after 2-3 weeks or gets worse.  Cannot control your cough with cough suppressant medicines and you are losing sleep.  Have pain that gets worse or pain that is not helped with medicine.  Have a fever.  Have unexplained weight loss.  Have night sweats. Get help right away if:  You cough up blood.  You have difficulty breathing.  Your heartbeat is very fast. These symptoms may represent a serious problem that is an emergency. Do not wait to see if the symptoms will go away. Get medical help right away. Call your local emergency services (911 in the [place] Do not drive yourself to the hospital. Summary  Coughing is a reflex that clears your throat and your airways. It is normal to cough occasionally, but a cough that happens with other symptoms or lasts a long time may be a sign of a condition that needs treatment.  Take over-the-counter and prescription medicines only as told by your health care  provider.  Always cover your mouth when you cough.  Contact a health care provider if you have new symptoms or a cough that does not get better after 2-3 weeks or gets worse. This information is not intended to replace advice given to you by your health care provider. Make sure you discuss any questions you have with your health care provider. Document Released: 08/14/2010 Document Revised: 03/06/2018 Document Reviewed: 03/06/2018 Elsevier Patient Education  2020  [employer]

## 2018-10-04 NOTE — Progress Notes (Signed)
Pt has been exposed to positive corona individual. She is asking to be tested for the virus. She denied medication in the past for the cough she has had due to the psychotropic drugs that she takes. She says she will take the medication this time.

## 2018-10-04 NOTE — Progress Notes (Signed)
Virtual Visit via Telephone Note  I connected with Carly Delacruz on 10/04/18 at 3:13 PM by telephone and verified that I am speaking with the correct person using two identifiers.   I discussed the limitations, risks, security and privacy concerns of performing an evaluation and management service by telephone and the availability of in person appointments. I also discussed with the patient that there may be a patient responsible charge related to this service. The patient expressed understanding and agreed to proceed, consent obtained  Chief complaint: covid testing  History of Present Illness: Carly Delacruz is a 53 y.o. female  Possible exposure to Covid. Was advised yesterday that someone she was around 6 weeks ago at dinner was recently diagnosed with Covid 19. Unknown how long her contact was sick prior to her receiving info about his infection.   Has had cough since May. Worse at night. Feels like drainage from sinuses. Itchy/tickle in throat.  No fever, night sweats with hot flushes with suspected menopause sx's past 2 months.  new myalgias, no change in taste/smell, no measured fever.  No heartburn - has been taking famotidine.   Tx: zyrtec, benadryl at rec of pharmacist.    Patient Active Problem List   Diagnosis Date Noted  . Other chronic pain 02/01/2018  . Lower extremity edema 12/21/2017  . Depression 05/08/2017  . Glaucoma 05/02/2017  . Chronic back pain 08/12/2016  . Anemia 08/12/2016  . H/O bladder infections 08/12/2016  . S/P gastric bypass 08/12/2016  . Polypharmacy   . Sleep-disordered breathing 07/26/2014  . GERD (gastroesophageal reflux disease) 06/15/2014  . Neuropathy 06/15/2014  . Essential hypertension 06/15/2014  . Obesity 05/08/2013  . Morbid obesity (HCC) 03/02/2003   Past Medical History:  Diagnosis Date  . Allergy   . Anemia   . Anxiety   . Arthritis 12/10/03  . Atrophic vaginitis 2009  . BV (bacterial vaginosis) 2006  . Chronic  back pain   . Chronic LLQ pain   . Chronic post-traumatic stress disorder (PTSD)   . Complication of anesthesia   . Elevated LFTs   . Endometriosis   . Fatigue 2012  . Gallstones   . GERD (gastroesophageal reflux disease)   . H/O bladder infections   . H/O mumps   . H/O varicella   . History of candidal vulvovaginitis 03/21/03  . HTN (hypertension)   . Insomnia   . Insomnia secondary to situational depression 06/06/07  . Liver mass, right lobe 08/13/04   Nodule  . Malaise 2012  . Morbid obesity (HCC) 2005  . Osteoporosis   . S/P gastric bypass   . Severe episode of recurrent major depressive disorder, without psychotic features (HCC)   . Sleep apnea 2005  . SUI (stress urinary incontinence, female) 08/12/03  . Trichomonas   . Vulvar lesion 03/21/03  . Yeast infection    Past Surgical History:  Procedure Laterality Date  . ABDOMINAL HYSTERECTOMY  2001  . APPENDECTOMY  2001  . CHOLECYSTECTOMY  2011  . DILATION AND CURETTAGE OF UTERUS    . GASTRIC BYPASS  2007   [facility], Dr. Daphine DeutscherMartin  . KNEE ARTHROSCOPY Left   . LAPAROTOMY    . TONSILLECTOMY    . WISDOM TOOTH EXTRACTION     Allergies  Allergen Reactions  . Morphine And Related Shortness Of Breath and Itching  . Septra [Sulfamethoxazole-Trimethoprim] Anaphylaxis and Hives  . Sulfa Antibiotics Anaphylaxis, Hives and Swelling    Throat swelling  . Sulfamethoxazole-Trimethoprim  Anaphylaxis and Hives  . Sulfasalazine Anaphylaxis, Hives, Swelling and Other (See Comments)    Throat swelling  . Ultram [Tramadol] Shortness Of Breath, Anxiety and Other (See Comments)    "panic attacks"   . Ciprofloxacin Other (See Comments)    This medication was given along with a sulfa. Reaction is unknown at this time. Must list due to possible allergy.  Leodis [place]. Darvocet [Propoxyphene N-Acetaminophen] Itching  . Adhesive [Tape] Rash  . Codeine Itching and Other (See Comments)    No change when benadryl is given  . Demerol  [Meperidine] Nausea Only  . Hydromorphone Itching and Other (See Comments)    No change when benadryl is given  . Losartan Palpitations   Prior to Admission medications   Medication Sig Start Date End Date Taking? Authorizing Provider  amitriptyline (ELAVIL) 150 MG tablet Take 1 tablet (150 mg total) by mouth at bedtime. 10/03/17 10/03/18  Burnard LeighEksir, Alexander Arya, MD  carvedilol (COREG) 6.25 MG tablet Take 1 tablet (6.25 mg total) by mouth 2 (two) times daily. 04/19/18   Runell GessBerry, Jonathan J, MD  diazepam (VALIUM) 5 MG tablet Place 5 mg vaginally at bedtime.     [provider]  famotidine (PEPCID) 20 MG tablet TAKE 1 TABLET BY MOUTH TWICE DAILY 02/14/18   Evaristo BuryShambley, Ashleigh N, NP  FLUoxetine (PROZAC) 40 MG capsule Take 1 capsule (40 mg total) by mouth daily. 10/03/17   Eksir, Bo McclintockAlexander Arya, MD  furosemide (LASIX) 20 MG tablet Take 1 tablet (20 mg total) by mouth daily as needed for fluid or edema. 07/12/18   Runell GessBerry, Jonathan J, MD  gabapentin (NEURONTIN) 300 MG capsule Take 1 capsule (300 mg total) by mouth 2 (two) times daily. 08/16/16   Hongalgi, Maximino GreenlandAnand D, MD  hydrOXYzine (VISTARIL) 25 MG capsule Take 25 mg by mouth as needed. 11/21/17   [provider]  lisinopril (ZESTRIL) 10 MG tablet Take 1 tablet (10 mg total) by mouth daily. 07/12/18   Runell GessBerry, Jonathan J, MD  potassium chloride (K-DUR,KLOR-CON) 10 MEQ tablet Take 1 tablet (10 mEq total) by mouth daily. As directed with lasix 02/20/18   Evaristo BuryShambley, Ashleigh N, NP  QUEtiapine (SEROQUEL) 25 MG tablet Take 25 mg by mouth at bedtime.    [provider]  tapentadol (NUCYNTA) 50 MG tablet Take 50 mg by mouth 3 (three) times daily.    [provider]  tiZANidine (ZANAFLEX) 4 MG tablet Take 1 tablet (4 mg total) by mouth 2 (two) times daily. 04/19/18   Evaristo BuryShambley, Ashleigh N, NP   Social History   Socioeconomic History  . Marital status: Single    Spouse name: Not on file  . Number of children: 0  . Years of education: Not on  file  . Highest education level: Bachelor's degree (e.g., BA, AB, BS)  Occupational History  . Occupation: DISABILITY  Social Needs  . Financial resource strain: Not on file  . Food insecurity    Worry: Not on file    Inability: Not on file  . Transportation needs    Medical: Not on file    Non-medical: Not on file  Tobacco Use  . Smoking status: Never Smoker  . Smokeless tobacco: Never Used  Substance and Sexual Activity  . Alcohol use: No  . Drug use: No  . Sexual activity: Never    Birth control/protection: Surgical    Comment: hysterectomy   Lifestyle  . Physical activity    Days per week: Not on file    Minutes  per session: Not on file  . Stress: Not on file  Relationships  . Social Musicianconnections    Talks on phone: Not on file    Gets together: Not on file    Attends religious service: Not on file    Active member of club or organization: Not on file    Attends meetings of clubs or organizations: Not on file    Relationship status: Not on file  . Intimate partner violence    Fear of current or ex partner: Not on file    Emotionally abused: Not on file    Physically abused: Not on file    Forced sexual activity: Not on file  Other Topics Concern  . Not on file  Social History Narrative   Patient is [place]. Her mother is living with her in a one level house, 2 steps to enter.      Observations/Objective: Few episodes of cough during phone call but otherwise speaking in full sentences without any respiratory distress, understanding expressed of plan with all questions answered  Assessment and Plan: PND (post-nasal drip) - Plan: fluticasone (FLONASE) 50 MCG/ACT nasal spray,   Cough - Plan: fluticasone (FLONASE) 50 MCG/ACT nasal spray,   Close Exposure to Covid-19 Virus - Plan: Novel Coronavirus, NAA ([facility]),  Suspect postnasal drip with allergies for cough, recommended adding Flonase.  COVID testing ordered based on potential exposure as above, but that was  not apparently recent.  RTC precautions discussed  Follow Up Instructions:     Patient Instructions  Ok to continue zyrtec once per day for allergies, also start flonase to see if that helps cough. Recheck in 2 weeks for cough, Return to the clinic or go to the nearest emergency room if any of your symptoms worsen or new symptoms occur.  [facility]. Can have that testing done tomorrow as drive up test.    Cough, Adult Coughing is a reflex that clears your throat and your airways (respiratory system). Coughing helps to heal and protect your lungs. It is normal to cough occasionally, but a cough that happens with other symptoms or lasts a long time may be a sign of a condition that needs treatment. An acute cough may only last 2-3 weeks, while a chronic cough may last 8 or more weeks. Coughing is commonly caused by:  Infection of the respiratory systemby viruses or bacteria.  Breathing in substances that irritate your lungs.  Allergies.  Asthma.  Mucus that runs down the back of your throat (postnasal drip).  Smoking.  Acid backing up from the stomach into the esophagus (gastroesophageal reflux).  Certain medicines.  Chronic lung problems.  Other medical conditions such as heart failure or a blood clot in the lung (pulmonary embolism). Follow these instructions at home: Medicines  Take over-the-counter and prescription medicines only as told by your health care provider.  Talk with your health care provider before you take a cough suppressant medicine. Lifestyle   Avoid cigarette smoke. Do not use any products that contain nicotine or tobacco, such as cigarettes, e-cigarettes, and chewing tobacco. If you need help quitting, ask your health care provider.  Drink enough fluid to keep your urine pale yellow.  Avoid caffeine.  Do not drink alcohol if your health care provider tells you not to drink. General instructions   Pay close  attention to changes in your cough. Tell your health care provider about them.  Always cover your mouth when you cough.  Avoid  things that make you cough, such as perfume, candles, cleaning products, or campfire or tobacco smoke.  If the air is dry, use a cool mist vaporizer or humidifier in your bedroom or your home to help loosen secretions.  If your cough is worse at night, try to sleep in a semi-upright position.  Rest as needed.  Keep all follow-up visits as told by your health care provider. This is important. Contact a health care provider if you:  Have new symptoms.  Cough up pus.  Have a cough that does not get better after 2-3 weeks or gets worse.  Cannot control your cough with cough suppressant medicines and you are losing sleep.  Have pain that gets worse or pain that is not helped with medicine.  Have a fever.  Have unexplained weight loss.  Have night sweats. Get help right away if:  You cough up blood.  You have difficulty breathing.  Your heartbeat is very fast. These symptoms may represent a serious problem that is an emergency. Do not wait to see if the symptoms will go away. Get medical help right away. Call your local emergency services (911 in the [place] Do not drive yourself to the hospital. Summary  Coughing is a reflex that clears your throat and your airways. It is normal to cough occasionally, but a cough that happens with other symptoms or lasts a long time may be a sign of a condition that needs treatment.  Take over-the-counter and prescription medicines only as told by your health care provider.  Always cover your mouth when you cough.  Contact a health care provider if you have new symptoms or a cough that does not get better after 2-3 weeks or gets worse. This information is not intended to replace advice given to you by your health care provider. Make sure you discuss any questions you have with your health care provider. Document  Released: 08/14/2010 Document Revised: 03/06/2018 Document Reviewed: 03/06/2018 Elsevier Patient Education  [place].      I discussed the assessment and treatment plan with the patient. The patient was provided an opportunity to ask questions and all were answered. The patient agreed with the plan and demonstrated an understanding of the instructions.   The patient was advised to call back or seek an in-person evaluation if the symptoms worsen or if the condition fails to improve as anticipated.  I provided 15 minutes of non-face-to-face time during this encounter.  Signed,   Merri Ray, MD [facility].  10/04/18

## 2018-10-05 ENCOUNTER — Other Ambulatory Visit: Payer: Self-pay

## 2018-10-05 DIAGNOSIS — Z20822 Contact with and (suspected) exposure to covid-19: Secondary | ICD-10-CM

## 2018-10-06 LAB — NOVEL CORONAVIRUS, NAA: SARS-CoV-2, NAA: NOT DETECTED

## 2018-10-06 LAB — SPECIMEN STATUS REPORT

## 2018-10-09 ENCOUNTER — Ambulatory Visit (INDEPENDENT_AMBULATORY_CARE_PROVIDER_SITE_OTHER): Payer: BC Managed Care – PPO | Admitting: Licensed Clinical Social Worker

## 2018-10-09 ENCOUNTER — Encounter (HOSPITAL_COMMUNITY): Payer: Self-pay | Admitting: Licensed Clinical Social Worker

## 2018-10-09 DIAGNOSIS — F332 Major depressive disorder, recurrent severe without psychotic features: Secondary | ICD-10-CM

## 2018-10-09 DIAGNOSIS — G894 Chronic pain syndrome: Secondary | ICD-10-CM | POA: Diagnosis not present

## 2018-10-09 DIAGNOSIS — M461 Sacroiliitis, not elsewhere classified: Secondary | ICD-10-CM | POA: Diagnosis not present

## 2018-10-09 DIAGNOSIS — G4700 Insomnia, unspecified: Secondary | ICD-10-CM | POA: Diagnosis not present

## 2018-10-09 DIAGNOSIS — M47816 Spondylosis without myelopathy or radiculopathy, lumbar region: Secondary | ICD-10-CM | POA: Diagnosis not present

## 2018-10-09 MED FILL — NUCYNTA 50 MG TABS: 50 | 30 days supply | Qty: 90 | Fill #0

## 2018-10-09 MED FILL — LISINOPRIL 10 MG TABS: 10 | 90 days supply | Qty: 90 | Fill #1

## 2018-10-09 MED FILL — CYCLOBENZAPRINE HCL 5 MG TA: 5 | 30 days supply | Qty: 60 | Fill #0

## 2018-10-09 NOTE — Progress Notes (Addendum)
Virtual Visit via Phone Note  I connected with Carly Delacruz on 10/09/18 at  3:00 PM EDT by a phone enabled telemedicine application and verified that I am speaking with the correct person using two identifiers. Patient was unable to use webex today.   I discussed the limitations of evaluation and management by telemedicine and the availability of in person appointments. The patient expressed understanding and agreed to proceed.  History of Present Illness: Pt was referred for therapy by psychiatrist for depression and chronic pain.    Observations/Objective: Pt presented in  for her individual phone session. Pt discussed her psychiatric symptoms and current life events. Pt presented upbeat for her individual session today. She had been to see her pain management doctor today. She fell last week and has to go for imaging. Asked open ended questions.Pt reports she and her mother are communicating better. She has been using I statements, and boundaries with her mother. Role played I statements and boundaries with pt.     Assessment and Plan:Continue seeing patient weekly webex appointments.   Follow Up Instructions:   I discussed the assessment and treatment plan with the patient. The patient was provided an opportunity to ask questions and all were answered. The patient agreed with the plan and demonstrated an understanding of the instructions.   The patient was advised to call back or seek an in-person evaluation if the symptoms worsen or if the condition fails to improve as anticipated.  I provided 45 minutes of non-face-to-face time during this encounter.   Carly Delacruz, LCAS

## 2018-10-09 NOTE — Addendum Note (Signed)
Addended by: Jenkins Rouge on: 10/09/2018 06:32 PM   Modules accepted: Level of Service

## 2018-10-12 ENCOUNTER — Ambulatory Visit (HOSPITAL_COMMUNITY): Payer: BC Managed Care – PPO | Admitting: Licensed Clinical Social Worker

## 2018-10-13 DIAGNOSIS — M25572 Pain in left ankle and joints of left foot: Secondary | ICD-10-CM | POA: Diagnosis not present

## 2018-10-16 ENCOUNTER — Ambulatory Visit: Payer: BC Managed Care – PPO | Admitting: Emergency Medicine

## 2018-10-19 ENCOUNTER — Ambulatory Visit: Payer: BC Managed Care – PPO | Admitting: Emergency Medicine

## 2018-10-23 ENCOUNTER — Ambulatory Visit: Payer: BC Managed Care – PPO | Admitting: Emergency Medicine

## 2018-10-26 ENCOUNTER — Encounter (HOSPITAL_COMMUNITY): Payer: Self-pay | Admitting: Licensed Clinical Social Worker

## 2018-10-26 ENCOUNTER — Ambulatory Visit: Payer: BC Managed Care – PPO | Admitting: Emergency Medicine

## 2018-10-26 ENCOUNTER — Other Ambulatory Visit: Payer: Self-pay

## 2018-10-26 ENCOUNTER — Ambulatory Visit (INDEPENDENT_AMBULATORY_CARE_PROVIDER_SITE_OTHER): Payer: BC Managed Care – PPO | Admitting: Licensed Clinical Social Worker

## 2018-10-26 ENCOUNTER — Encounter: Payer: Self-pay | Admitting: Emergency Medicine

## 2018-10-26 VITALS — BP 130/87 | HR 82 | Temp 99.0°F | Resp 16 | Ht 63.5 in | Wt 254.2 lb

## 2018-10-26 DIAGNOSIS — F332 Major depressive disorder, recurrent severe without psychotic features: Secondary | ICD-10-CM

## 2018-10-26 DIAGNOSIS — I10 Essential (primary) hypertension: Secondary | ICD-10-CM

## 2018-10-26 DIAGNOSIS — G894 Chronic pain syndrome: Secondary | ICD-10-CM

## 2018-10-26 DIAGNOSIS — Z79899 Other long term (current) drug therapy: Secondary | ICD-10-CM

## 2018-10-26 DIAGNOSIS — G629 Polyneuropathy, unspecified: Secondary | ICD-10-CM

## 2018-10-26 DIAGNOSIS — F32A Depression, unspecified: Secondary | ICD-10-CM

## 2018-10-26 DIAGNOSIS — F329 Major depressive disorder, single episode, unspecified: Secondary | ICD-10-CM

## 2018-10-26 NOTE — Progress Notes (Signed)
Virtual Visit via Phone Note  I connected with Carly Delacruz on 10/26/18 at 10:00 AM EDT by a phone enabled telemedicine application and verified that I am speaking with the correct person using two identifiers. Patient was unable to use webex today.   I discussed the limitations of evaluation and management by telemedicine and the availability of in person appointments. The patient expressed understanding and agreed to proceed.  History of Present Illness: Pt was referred for therapy by psychiatrist for depression and chronic pain.    Observations/Objective: Pt presented in  for her individual phone session. Pt discussed her psychiatric symptoms and current life events. Pt reports she is having some cognitive issues: forgetfulness, getting lost while driving. She reports dr. Daron Offer changed one of her meds. Pt discussed her feelings surrounding a cognitive decline. Suggested pt call her psychiatrist's office to discuss her cognitive decline. Discussed with pt "having a purpose."  Gave suggestions where patient can find a daily purpose in a world of Covid-19.    Assessment and Plan: Continue seeing patient weekly webex appointments.   Follow Up Instructions:   I discussed the assessment and treatment plan with the patient. The patient was provided an opportunity to ask questions and all were answered. The patient agreed with the plan and demonstrated an understanding of the instructions.   The patient was advised to call back or seek an in-person evaluation if the symptoms worsen or if the condition fails to improve as anticipated.  I provided 45 minutes of non-face-to-face time during this encounter.   [name],[name], LCAS

## 2018-10-26 NOTE — Patient Instructions (Addendum)
   If you have lab work done today you will be contacted with your lab results within the next 2 weeks.  If you have not heard from us then please contact us. The fastest way to get your results is to register for My Chart.   IF you received an x-ray today, you will receive an invoice from [facility]. Please contact [facility] at [phone] with questions or concerns regarding your invoice.   IF you received labwork today, you will receive an invoice from [facility]. Please contact [facility] at [phone] with questions or concerns regarding your invoice.   Our billing staff will not be able to assist you with questions regarding bills from these companies.  You will be contacted with the lab results as soon as they are available. The fastest way to get your results is to activate your My Chart account. Instructions are located on the last page of this paperwork. If you have not heard from us regarding the results in 2 weeks, please contact this office.      Health Maintenance, Female Adopting a healthy lifestyle and getting preventive care are important in promoting health and wellness. Ask your health care provider about:  The right schedule for you to have regular tests and exams.  Things you can do on your own to prevent diseases and keep yourself healthy. What should I know about diet, weight, and exercise? Eat a healthy diet   Eat a diet that includes plenty of vegetables, fruits, low-fat dairy products, and lean protein.  Do not eat a lot of foods that are high in solid fats, added sugars, or sodium. Maintain a healthy weight Body mass index (BMI) is used to identify weight problems. It estimates body fat based on height and weight. Your health care provider can help determine your BMI and help you achieve or maintain a healthy weight. Get regular exercise Get regular exercise. This is one of the most important things you can do for your health. Most  adults should:  Exercise for at least 150 minutes each week. The exercise should increase your heart rate and make you sweat (moderate-intensity exercise).  Do strengthening exercises at least twice a week. This is in addition to the moderate-intensity exercise.  Spend less time sitting. Even light physical activity can be beneficial. Watch cholesterol and blood lipids Have your blood tested for lipids and cholesterol at 55 years of age, then have this test every 5 years. Have your cholesterol levels checked more often if:  Your lipid or cholesterol levels are high.  You are older than 55 years of age.  You are at high risk for heart disease. What should I know about cancer screening? Depending on your health history and family history, you may need to have cancer screening at various ages. This may include screening for:  Breast cancer.  Cervical cancer.  Colorectal cancer.  Skin cancer.  Lung cancer. What should I know about heart disease, diabetes, and high blood pressure? Blood pressure and heart disease  High blood pressure causes heart disease and increases the risk of stroke. This is more likely to develop in people who have high blood pressure readings, are of African descent, or are overweight.  Have your blood pressure checked: ? Every 3-5 years if you are 18-39 years of age. ? Every year if you are 40 years old or older. Diabetes Have regular diabetes screenings. This checks your fasting blood sugar level. Have the screening done:  Once every   three years after age 40 if you are at a normal weight and have a low risk for diabetes.  More often and at a younger age if you are overweight or have a high risk for diabetes. What should I know about preventing infection? Hepatitis B If you have a higher risk for hepatitis B, you should be screened for this virus. Talk with your health care provider to find out if you are at risk for hepatitis B infection. Hepatitis  C Testing is recommended for:  Everyone born from 1945 through 1965.  Anyone with known risk factors for hepatitis C. Sexually transmitted infections (STIs)  Get screened for STIs, including gonorrhea and chlamydia, if: ? You are sexually active and are younger than 55 years of age. ? You are older than 55 years of age and your health care provider tells you that you are at risk for this type of infection. ? Your sexual activity has changed since you were last screened, and you are at increased risk for chlamydia or gonorrhea. Ask your health care provider if you are at risk.  Ask your health care provider about whether you are at high risk for HIV. Your health care provider may recommend a prescription medicine to help prevent HIV infection. If you choose to take medicine to prevent HIV, you should first get tested for HIV. You should then be tested every 3 months for as long as you are taking the medicine. Pregnancy  If you are about to stop having your period (premenopausal) and you may become pregnant, seek counseling before you get pregnant.  Take 400 to 800 micrograms (mcg) of folic acid every day if you become pregnant.  Ask for birth control (contraception) if you want to prevent pregnancy. Osteoporosis and menopause Osteoporosis is a disease in which the bones lose minerals and strength with aging. This can result in bone fractures. If you are 65 years old or older, or if you are at risk for osteoporosis and fractures, ask your health care provider if you should:  Be screened for bone loss.  Take a calcium or vitamin D supplement to lower your risk of fractures.  Be given hormone replacement therapy (HRT) to treat symptoms of menopause. Follow these instructions at home: Lifestyle  Do not use any products that contain nicotine or tobacco, such as cigarettes, e-cigarettes, and chewing tobacco. If you need help quitting, ask your health care provider.  Do not use street  drugs.  Do not share needles.  Ask your health care provider for help if you need support or information about quitting drugs. Alcohol use  Do not drink alcohol if: ? Your health care provider tells you not to drink. ? You are pregnant, may be pregnant, or are planning to become pregnant.  If you drink alcohol: ? Limit how much you use to 0-1 drink a day. ? Limit intake if you are breastfeeding.  Be aware of how much alcohol is in your drink. In the [place] one drink equals one 12 oz bottle of beer (355 mL), one 5 oz glass of wine (148 mL), or one 1 oz glass of hard liquor (44 mL). General instructions  Schedule regular health, dental, and eye exams.  Stay current with your vaccines.  Tell your health care provider if: ? You often feel depressed. ? You have ever been abused or do not feel safe at home. Summary  Adopting a healthy lifestyle and getting preventive care are important in promoting health and wellness.    Follow your health care provider's instructions about healthy diet, exercising, and getting tested or screened for diseases.  Follow your health care provider's instructions on monitoring your cholesterol and blood pressure. This information is not intended to replace advice given to you by your health care provider. Make sure you discuss any questions you have with your health care provider. Document Released: 08/31/2010 Document Revised: 02/08/2018 Document Reviewed: 02/08/2018 Elsevier Patient Education  2020 [employer]  

## 2018-10-26 NOTE — Progress Notes (Signed)
Carly Delacruz 53 y.o.   Chief Complaint  Patient presents with   Establish Care   Memory Loss    gotten worse latter part of March 2020    HISTORY OF PRESENT ILLNESS: This is a 54 y.o. female with multiple complicated medical problems.  Telemedicine visit with me on 09/19/2018: Carly Delacruz is a 53 y.o. female patient.  First visit with this clinic.  Telephone visit today to establish care and consult about dry cough that started about 5 weeks ago with some runny nose.  Cough is mostly at night and not associated with difficulty breathing or wheezing.  Denies fever or chills.  Lives with mother at home and they are both scheduled to get tested for COVID tomorrow. Patient used to see internal medicine doctor with [facility] but her primary care doctor left the practice and other doctors are not taking new patients.  Patient looking for new primary care doctor. Patient has multiple chronic medical problems including chronic pain and psychiatric condition. Patient understands that I will not be accepting to be her primary care physician until I can formally meet with her in the office and go over her medical history and medications with her.  We need to be clear on what I will be able to chronically handle as well as what I will not be able to care for.  She understands. I will be able to see her in the office once we get COVID results and patient is symptom-free.  Here today to establish care and discuss her chronic medical problems. States she sees a psychiatrist on a regular basis as well as a pain management doctor.  She also seeks disability status. Medical records available to me reviewed today and medication list reviewed with patient. After careful evaluation of patient's medical history and medical records I come to the conclusion it is in this patient's best interest to seek chronic medical care with another physician.  I communicated this to her and she  understands.  HPI   Prior to Admission medications   Medication Sig Start Date End Date Taking? Authorizing Provider  carvedilol (COREG) 6.25 MG tablet Take 1 tablet (6.25 mg total) by mouth 2 (two) times daily. 04/19/18  Yes Runell Gess, MD  diazepam (VALIUM) 5 MG tablet Place 5 mg vaginally at bedtime.    Yes [provider]  famotidine (PEPCID) 20 MG tablet TAKE 1 TABLET BY MOUTH TWICE DAILY 02/14/18  Yes Evaristo Bury, NP  FLUoxetine (PROZAC) 40 MG capsule Take 1 capsule (40 mg total) by mouth daily. 10/03/17  Yes Eksir, Bo Mcclintock, MD  fluticasone [facility]) 50 MCG/ACT nasal spray Place 1-2 sprays into both nostrils daily. 10/04/18  Yes Shade Flood, MD  furosemide (LASIX) 20 MG tablet Take 1 tablet (20 mg total) by mouth daily as needed for fluid or edema. 07/12/18  Yes Runell Gess, MD  gabapentin (NEURONTIN) 300 MG capsule Take 1 capsule (300 mg total) by mouth 2 (two) times daily. 08/16/16  Yes Hongalgi, Maximino Greenland, MD  hydrOXYzine (VISTARIL) 25 MG capsule Take 25 mg by mouth as needed. 11/21/17  Yes [provider]  lisinopril (ZESTRIL) 10 MG tablet Take 1 tablet (10 mg total) by mouth daily. 07/12/18  Yes Runell Gess, MD  potassium chloride (K-DUR,KLOR-CON) 10 MEQ tablet Take 1 tablet (10 mEq total) by mouth daily. As directed with lasix 02/20/18  Yes Shambley, Audie Box, NP  QUEtiapine (SEROQUEL) 25 MG tablet Take 25  mg by mouth at bedtime.   Yes [provider]  tapentadol (NUCYNTA) 50 MG tablet Take 50 mg by mouth 3 (three) times daily.   Yes [provider]  amitriptyline (ELAVIL) 150 MG tablet Take 1 tablet (150 mg total) by mouth at bedtime. 10/03/17 10/03/18  Burnard Leigh, MD  tiZANidine (ZANAFLEX) 4 MG tablet Take 1 tablet (4 mg total) by mouth 2 (two) times daily. Patient not taking: Reported on 10/26/2018 04/19/18   Evaristo Bury, NP    Allergies  Allergen Reactions   Morphine And Related Shortness Of  Breath and Itching   Septra [Sulfamethoxazole-Trimethoprim] Anaphylaxis and Hives   Sulfa Antibiotics Anaphylaxis, Hives and Swelling    Throat swelling   Sulfamethoxazole-Trimethoprim Anaphylaxis and Hives   Sulfasalazine Anaphylaxis, Hives, Swelling and Other (See Comments)    Throat swelling   Ultram [Tramadol] Shortness Of Breath, Anxiety and Other (See Comments)    "panic attacks"    Ciprofloxacin Other (See Comments)    This medication was given along with a sulfa. Reaction is unknown at this time. Must list due to possible allergy.   Darvocet [Propoxyphene N-Acetaminophen] Itching   Adhesive [Tape] Rash   Codeine Itching and Other (See Comments)    No change when benadryl is given   Demerol [Meperidine] Nausea Only   Hydromorphone Itching and Other (See Comments)    No change when benadryl is given   Losartan Palpitations    Patient Active Problem List   Diagnosis Date Noted   Other chronic pain 02/01/2018   Depression 05/08/2017   Glaucoma 05/02/2017   Chronic back pain 08/12/2016   Anemia 08/12/2016   H/O bladder infections 08/12/2016   S/P gastric bypass 08/12/2016   Polypharmacy    Sleep-disordered breathing 07/26/2014   GERD (gastroesophageal reflux disease) 06/15/2014   Neuropathy 06/15/2014   Essential hypertension 06/15/2014   Obesity 05/08/2013   Morbid obesity (HCC) 03/02/2003    Past Medical History:  Diagnosis Date   Allergy    Anemia    Anxiety    Arthritis 12/10/03   Atrophic vaginitis 2009   BV (bacterial vaginosis) 2006   Chronic back pain    Chronic LLQ pain    Chronic post-traumatic stress disorder (PTSD)    Complication of anesthesia    Elevated LFTs    Endometriosis    Fatigue 2012   Gallstones    GERD (gastroesophageal reflux disease)    H/O bladder infections    H/O mumps    H/O varicella    History of candidal vulvovaginitis 03/21/03   HTN (hypertension)    Insomnia    Insomnia  secondary to situational depression 06/06/07   Liver mass, right lobe 08/13/04   Nodule   Malaise 2012   Morbid obesity (HCC) 2005   Osteoporosis    S/P gastric bypass    Severe episode of recurrent major depressive disorder, without psychotic features (HCC)    Sleep apnea 2005   SUI (stress urinary incontinence, female) 08/12/03   Trichomonas    Vulvar lesion 03/21/03   Yeast infection     Past Surgical History:  Procedure Laterality Date   ABDOMINAL HYSTERECTOMY  2001   APPENDECTOMY  2001   CHOLECYSTECTOMY  2011   DILATION AND CURETTAGE OF UTERUS     GASTRIC BYPASS  2007   [facility], Dr. Daphine Deutscher   KNEE ARTHROSCOPY Left    LAPAROTOMY     TONSILLECTOMY     WISDOM TOOTH EXTRACTION  Social History   Socioeconomic History   Marital status: Single    Spouse name: Not on file   Number of children: 0   Years of education: Not on file   Highest education level: Bachelor's degree (e.g., BA, AB, BS)  Occupational History   Occupation: DISABILITY  Social Designer, fashion/clothing strain: Not on file   Food insecurity    Worry: Not on file    Inability: Not on file   Transportation needs    Medical: Not on file    Non-medical: Not on file  Tobacco Use   Smoking status: Never Smoker   Smokeless tobacco: Never Used  Substance and Sexual Activity   Alcohol use: No   Drug use: No   Sexual activity: Never    Birth control/protection: Surgical    Comment: hysterectomy   Lifestyle   Physical activity    Days per week: Not on file    Minutes per session: Not on file   Stress: Not on file  Relationships   Social connections    Talks on phone: Not on file    Gets together: Not on file    Attends religious service: Not on file    Active member of club or organization: Not on file    Attends meetings of clubs or organizations: Not on file    Relationship status: Not on file   Intimate partner violence    Fear of current  or ex partner: Not on file    Emotionally abused: Not on file    Physically abused: Not on file    Forced sexual activity: Not on file  Other Topics Concern   Not on file  Social History Narrative   Patient is singel. Her mother is living with her in a one level house, 2 steps to enter.     Family History  Problem Relation Age of Onset   Diabetes Paternal Grandmother    Leukemia Maternal Grandmother    Arthritis Mother    Prostate cancer Father    Colitis Sister    Hypertension Sister    Deep vein thrombosis Sister    Kidney disease Brother    Obstructive Sleep Apnea Brother      Review of Systems  Constitutional: Negative for chills and fever.  HENT: Negative for congestion and sore throat.   Respiratory: Negative for cough and shortness of breath.   Cardiovascular: Negative for chest pain.  Gastrointestinal: Negative for diarrhea, nausea and vomiting.  Neurological:       Memory issues  Psychiatric/Behavioral: Positive for depression (Chronic).  All other systems reviewed and are negative.   Vitals:   10/26/18 1433  BP: 130/87  Pulse: 82  Resp: 16  Temp: 99 F (37.2 C)  SpO2: 94%    Physical Exam Vitals signs reviewed.  Constitutional:      Appearance: She is obese.  HENT:     Head: Normocephalic.  Eyes:     Extraocular Movements: Extraocular movements intact.  Neck:     Musculoskeletal: Normal range of motion.  Cardiovascular:     Rate and Rhythm: Normal rate.  Pulmonary:     Effort: Pulmonary effort is normal.  Neurological:     Mental Status: She is alert and oriented to person, place, and time.  Psychiatric:        Mood and Affect: Mood normal.        Behavior: Behavior normal.      ASSESSMENT & PLAN: After careful evaluation  of patient's medical history and medical records I come to the conclusion it is in this patient's best interest to seek chronic medical care with another physician.  I communicated this to her and she  understands. Carly Delacruz was seen today for establish care and memory loss.  Diagnoses and all orders for this visit:  Chronic pain syndrome  Morbid obesity (HCC)  Chronic depression  Polypharmacy  Essential hypertension  Neuropathy    Patient Instructions       If you have lab work done today you will be contacted with your lab results within the next 2 weeks.  If you have not heard from us then please contact us. The fastest way to get your results is to register for My Chart.   IF you received an x-ray today, you will receive an invoice from [facility]. Please contact [facility] at [phone] with questions or concerns regarding your invoice.   IF you received labwork today, you will receive an invoice from [facility]. Please contact [facility] at [phone] with questions or concerns regarding your invoice.   Our billing staff will not be able to assist you with questions regarding bills from these companies.  You will be contacted with the lab results as soon as they are available. The fastest way to get your results is to activate your My Chart account. Instructions are located on the last page of this paperwork. If you have not heard from us regarding the results in 2 weeks, please contact this office.     Health Maintenance, Female Adopting a healthy lifestyle and getting preventive care are important in promoting health and wellness. Ask your health care provider about:  The right schedule for you to have regular tests and exams.  Things you can do on your own to prevent diseases and keep yourself healthy. What should I know about diet, weight, and exercise? Eat a healthy diet   Eat a diet that includes plenty of vegetables, fruits, low-fat dairy products, and lean protein.  Do not eat a lot of foods that are high in solid fats, added sugars, or sodium. Maintain a healthy weight Body mass index (BMI) is used to identify weight problems. It  estimates body fat based on height and weight. Your health care provider can help determine your BMI and help you achieve or maintain a healthy weight. Get regular exercise Get regular exercise. This is one of the most important things you can do for your health. Most adults should:  Exercise for at least 150 minutes each week. The exercise should increase your heart rate and make you sweat (moderate-intensity exercise).  Do strengthening exercises at least twice a week. This is in addition to the moderate-intensity exercise.  Spend less time sitting. Even light physical activity can be beneficial. Watch cholesterol and blood lipids Have your blood tested for lipids and cholesterol at 55 years of age, then have this test every 5 years. Have your cholesterol levels checked more often if:  Your lipid or cholesterol levels are high.  You are older than 55 years of age.  You are at high risk for heart disease. What should I know about cancer screening? Depending on your health history and family history, you may need to have cancer screening at various ages. This may include screening for:  Breast cancer.  Cervical cancer.  Colorectal cancer.  Skin cancer.  Lung cancer. What should I know about heart disease, diabetes, and high blood pressure? Blood pressure and heart disease  High blood pressure causes heart disease and increases the risk of stroke. This is more likely to develop in people who have high blood pressure readings, are of African descent, or are overweight.  Have your blood pressure checked: ? Every 3-5 years if you are 5318-55 years of age. ? Every year if you are 55 years old or older. Diabetes Have regular diabetes screenings. This checks your fasting blood sugar level. Have the screening done:  Once every three years after age 55 if you are at a normal weight and have a low risk for diabetes.  More often and at a younger age if you are overweight or have a high  risk for diabetes. What should I know about preventing infection? Hepatitis B If you have a higher risk for hepatitis B, you should be screened for this virus. Talk with your health care provider to find out if you are at risk for hepatitis B infection. Hepatitis C Testing is recommended for:  Everyone born from [birth year] through 1967.  Anyone with known risk factors for hepatitis C. Sexually transmitted infections (STIs)  Get screened for STIs, including gonorrhea and chlamydia, if: ? You are sexually active and are younger than 55 years of age. ? You are older than 55 years of age and your health care provider tells you that you are at risk for this type of infection. ? Your sexual activity has changed since you were last screened, and you are at increased risk for chlamydia or gonorrhea. Ask your health care provider if you are at risk.  Ask your health care provider about whether you are at high risk for HIV. Your health care provider may recommend a prescription medicine to help prevent HIV infection. If you choose to take medicine to prevent HIV, you should first get tested for HIV. You should then be tested every 3 months for as long as you are taking the medicine. Pregnancy  If you are about to stop having your period (premenopausal) and you may become pregnant, seek counseling before you get pregnant.  Take 400 to 800 micrograms (mcg) of folic acid every day if you become pregnant.  Ask for birth control (contraception) if you want to prevent pregnancy. Osteoporosis and menopause Osteoporosis is a disease in which the bones lose minerals and strength with aging. This can result in bone fractures. If you are 55 years old or older, or if you are at risk for osteoporosis and fractures, ask your health care provider if you should:  Be screened for bone loss.  Take a calcium or vitamin D supplement to lower your risk of fractures.  Be given hormone replacement therapy (HRT) to treat  symptoms of menopause. Follow these instructions at home: Lifestyle  Do not use any products that contain nicotine or tobacco, such as cigarettes, e-cigarettes, and chewing tobacco. If you need help quitting, ask your health care provider.  Do not use street drugs.  Do not share needles.  Ask your health care provider for help if you need support or information about quitting drugs. Alcohol use  Do not drink alcohol if: ? Your health care provider tells you not to drink. ? You are pregnant, may be pregnant, or are planning to become pregnant.  If you drink alcohol: ? Limit how much you use to 0-1 drink a day. ? Limit intake if you are breastfeeding.  Be aware of how much alcohol is in your drink. In the [place] one drink equals one 12 oz bottle  of beer (355 mL), one 5 oz glass of wine (148 mL), or one 1 oz glass of hard liquor (44 mL). General instructions  Schedule regular health, dental, and eye exams.  Stay current with your vaccines.  Tell your health care provider if: ? You often feel depressed. ? You have ever been abused or do not feel safe at home. Summary  Adopting a healthy lifestyle and getting preventive care are important in promoting health and wellness.  Follow your health care provider's instructions about healthy diet, exercising, and getting tested or screened for diseases.  Follow your health care provider's instructions on monitoring your cholesterol and blood pressure. This information is not intended to replace advice given to you by your health care provider. Make sure you discuss any questions you have with your health care provider. Document Released: 08/31/2010 Document Revised: 02/08/2018 Document Reviewed: 02/08/2018 Elsevier Patient Education  2020 Elsevier Inc.       Edwina Barth, MD [facility]

## 2018-11-04 MED FILL — CYCLOBENZAPRINE HCL 5 MG TA: 5 | 30 days supply | Qty: 60 | Fill #1

## 2018-11-04 MED FILL — NUCYNTA 50 MG TABS: 50 | 30 days supply | Qty: 90 | Fill #0

## 2018-11-07 DIAGNOSIS — G894 Chronic pain syndrome: Secondary | ICD-10-CM | POA: Diagnosis not present

## 2018-11-07 DIAGNOSIS — G4700 Insomnia, unspecified: Secondary | ICD-10-CM | POA: Diagnosis not present

## 2018-11-07 DIAGNOSIS — M461 Sacroiliitis, not elsewhere classified: Secondary | ICD-10-CM | POA: Diagnosis not present

## 2018-11-07 DIAGNOSIS — Z79891 Long term (current) use of opiate analgesic: Secondary | ICD-10-CM | POA: Diagnosis not present

## 2018-11-07 DIAGNOSIS — M47816 Spondylosis without myelopathy or radiculopathy, lumbar region: Secondary | ICD-10-CM | POA: Diagnosis not present

## 2018-11-07 MED FILL — GABAPENTIN 100 MG CAPSULE: 100 | 90 days supply | Qty: 270 | Fill #0

## 2018-11-07 MED FILL — diazePAM 5 MG TABS: 5 | 16 days supply | Qty: 50 | Fill #0

## 2018-11-08 DIAGNOSIS — M25562 Pain in left knee: Secondary | ICD-10-CM | POA: Diagnosis not present

## 2018-11-08 DIAGNOSIS — M1712 Unilateral primary osteoarthritis, left knee: Secondary | ICD-10-CM | POA: Diagnosis not present

## 2018-11-10 ENCOUNTER — Telehealth: Payer: Self-pay | Admitting: Emergency Medicine

## 2018-11-10 NOTE — Telephone Encounter (Signed)
Pt left a vm for medical records. Pt is wanting a letter stating Dr/ Mitchel Honour can not take care of her any longer. She would like this sent to her via-she didn't say. She would like to send this letter to the Disability board. Please advise at [phone]

## 2018-11-13 ENCOUNTER — Ambulatory Visit (INDEPENDENT_AMBULATORY_CARE_PROVIDER_SITE_OTHER): Payer: BC Managed Care – PPO | Admitting: Licensed Clinical Social Worker

## 2018-11-13 ENCOUNTER — Encounter (HOSPITAL_COMMUNITY): Payer: Self-pay | Admitting: Licensed Clinical Social Worker

## 2018-11-13 ENCOUNTER — Other Ambulatory Visit: Payer: Self-pay

## 2018-11-13 DIAGNOSIS — F332 Major depressive disorder, recurrent severe without psychotic features: Secondary | ICD-10-CM | POA: Diagnosis not present

## 2018-11-13 NOTE — Progress Notes (Signed)
Virtual Visit via Video Note  I connected with Carly Delacruz on 11/13/18 at  2:00 PM EDT by a video enabled telemedicine application and verified that I am speaking with the correct person using two identifiers.   I discussed the limitations of evaluation and management by telemedicine and the availability of in person appointments. The patient expressed understanding and agreed to proceed.  History of Present Illness: Pt was referred for therapy by psychiatrist for depression and chronic pain.    Observations/Objective: Pt presented anxious for her individual video enabled telemedicine appointment. Pt discussed her psychiatric symptoms and current life events. Pt was anxious because of an upcoming review of her LTD with [employer].  Used Socratic questions. Used CBT assisting pt with her negative thoughts. Pt again, discussed her cognitive decline. Coached pt on coping skills for memory issues. Again suggested she contact her psychiatrist about her cognition and memory issues. 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 Congratulated pt on attaining one of her goals she had set. Assisted pt with identifying more goals - having a purpose in a world of Covid-19.       Assessment and Plan: Continue seeing patient weekly webex appointments.   Follow Up Instructions:   I discussed the assessment and treatment plan with the patient. The patient was provided an opportunity to ask questions and all were answered. The patient agreed with the plan and demonstrated an understanding of the instructions.   The patient was advised to call back or seek an in-person evaluation if the symptoms worsen or if the condition fails to improve as anticipated.  I provided 45 minutes of non-face-to-face time during this encounter.   Carly Delacruz, LCAS

## 2018-11-15 ENCOUNTER — Other Ambulatory Visit: Payer: Self-pay | Admitting: Internal Medicine

## 2018-11-15 DIAGNOSIS — M1712 Unilateral primary osteoarthritis, left knee: Secondary | ICD-10-CM | POA: Diagnosis not present

## 2018-11-15 DIAGNOSIS — K219 Gastro-esophageal reflux disease without esophagitis: Secondary | ICD-10-CM

## 2018-11-15 NOTE — Telephone Encounter (Signed)
Please review

## 2018-11-16 ENCOUNTER — Telehealth: Payer: Self-pay

## 2018-11-16 NOTE — Telephone Encounter (Signed)
Copied from [place] [id number]. Topic: Appointment Scheduling - Scheduling Inquiry for Clinic >> Nov 16, 2018 11:00 AM Erick Blinks wrote: Reason for CRM: Pt is a former pt of Dr. Gayla Medicus, pt tried to est care at [facility] but was told by the MD that she has too many complications and refused to service her. She currently is in need of a medication refill appt with anyone that is able, while she establishes care with another office. Tried calling office,  Best contact: [phone] >> Nov 16, 2018 12:49 PM Morphies, Isidoro Donning wrote: Would Mickel Baas be willing to see her for this?

## 2018-11-17 ENCOUNTER — Other Ambulatory Visit: Payer: Self-pay | Admitting: Family

## 2018-11-17 DIAGNOSIS — K219 Gastro-esophageal reflux disease without esophagitis: Secondary | ICD-10-CM

## 2018-11-17 MED ORDER — FAMOTIDINE 20 MG PO TABS: 20.0000 mg | ORAL_TABLET | Freq: Two times a day (BID) | ORAL | 0 refills | Status: DC

## 2018-11-17 NOTE — Telephone Encounter (Signed)
Which medication is she needing? Most likely I will give her a refill and she can see establish with new PCP as previously recommended.

## 2018-11-20 ENCOUNTER — Telehealth: Payer: Self-pay

## 2018-11-20 NOTE — Telephone Encounter (Signed)

## 2018-11-21 ENCOUNTER — Ambulatory Visit: Payer: BC Managed Care – PPO | Admitting: Family Medicine

## 2018-11-21 DIAGNOSIS — M25562 Pain in left knee: Secondary | ICD-10-CM | POA: Diagnosis not present

## 2018-11-21 DIAGNOSIS — M1712 Unilateral primary osteoarthritis, left knee: Secondary | ICD-10-CM | POA: Diagnosis not present

## 2018-11-21 MED FILL — predniSONE 20 MG TABS: 20 | 6 days supply | Qty: 9 | Fill #0

## 2018-11-25 MED FILL — diazePAM 5 MG TABS: 5 | 16 days supply | Qty: 50 | Fill #0

## 2018-11-27 ENCOUNTER — Other Ambulatory Visit: Payer: Self-pay

## 2018-11-27 ENCOUNTER — Ambulatory Visit (INDEPENDENT_AMBULATORY_CARE_PROVIDER_SITE_OTHER): Payer: BC Managed Care – PPO | Admitting: Licensed Clinical Social Worker

## 2018-11-27 ENCOUNTER — Encounter (HOSPITAL_COMMUNITY): Payer: Self-pay | Admitting: Licensed Clinical Social Worker

## 2018-11-27 DIAGNOSIS — F332 Major depressive disorder, recurrent severe without psychotic features: Secondary | ICD-10-CM

## 2018-11-27 NOTE — Progress Notes (Signed)
Virtual Visit via Video Note  I connected with Carly Delacruz on 11/27/18 at  3:00 PM EDT by a video enabled telemedicine application and verified that I am speaking with the correct person using two identifiers.   I discussed the limitations of evaluation and management by telemedicine and the availability of in person appointments. The patient expressed understanding and agreed to proceed.  History of Present Illness: Pt was referred for therapy by psychiatrist for depression and chronic pain.    Observations/Objective: Pt presented depressed for her individual video enabled telemedicine appointment. Pt discussed her psychiatric symptoms and current life events. Patient reports her moods are stable today.. Asked patient about her review of LTD with [facility]. She reports she is waiting to hear back from them. Pt continues to have several doctor's appointments, trying to keep up with her health issues. Discussed her health goals. Patient was open to discuss how to improve her health. She has begun using boundaries with her mother which has made their relationship bearable. Pt discussed her desire for a puppy. Used socratic questions.         Assessment and Plan: Continue seeing patient weekly webex appointments.   Follow Up Instructions:   I discussed the assessment and treatment plan with the patient. The patient was provided an opportunity to ask questions and all were answered. The patient agreed with the plan and demonstrated an understanding of the instructions.   The patient was advised to call back or seek an in-person evaluation if the symptoms worsen or if the condition fails to improve as anticipated.  I provided 60 minutes of non-face-to-face time during this encounter.   Carly Delacruz S, LCAS

## 2018-11-28 DIAGNOSIS — M1712 Unilateral primary osteoarthritis, left knee: Secondary | ICD-10-CM | POA: Diagnosis not present

## 2018-11-29 MED FILL — FAMOTIDINE 20 MG TABS: 20 | 30 days supply | Qty: 60 | Fill #0

## 2018-12-01 DIAGNOSIS — E559 Vitamin D deficiency, unspecified: Secondary | ICD-10-CM | POA: Diagnosis not present

## 2018-12-01 MED FILL — CYCLOBENZAPRINE HCL 5 MG TA: 5 | 30 days supply | Qty: 60 | Fill #2

## 2018-12-01 MED FILL — VITAMIN D3 50000 UNIT CAPS: 1.25 MG | 56 days supply | Qty: 8 | Fill #0

## 2018-12-06 ENCOUNTER — Telehealth: Payer: Self-pay

## 2018-12-06 ENCOUNTER — Encounter: Payer: Self-pay | Admitting: Cardiology

## 2018-12-06 ENCOUNTER — Telehealth (INDEPENDENT_AMBULATORY_CARE_PROVIDER_SITE_OTHER): Payer: BC Managed Care – PPO | Admitting: Cardiology

## 2018-12-06 VITALS — Ht 63.0 in | Wt 257.0 lb

## 2018-12-06 DIAGNOSIS — F32A Depression, unspecified: Secondary | ICD-10-CM

## 2018-12-06 DIAGNOSIS — M1710 Unilateral primary osteoarthritis, unspecified knee: Secondary | ICD-10-CM | POA: Insufficient documentation

## 2018-12-06 DIAGNOSIS — R05 Cough: Secondary | ICD-10-CM

## 2018-12-06 DIAGNOSIS — M179 Osteoarthritis of knee, unspecified: Secondary | ICD-10-CM | POA: Insufficient documentation

## 2018-12-06 DIAGNOSIS — R059 Cough, unspecified: Secondary | ICD-10-CM | POA: Insufficient documentation

## 2018-12-06 DIAGNOSIS — I10 Essential (primary) hypertension: Secondary | ICD-10-CM | POA: Diagnosis not present

## 2018-12-06 DIAGNOSIS — G4730 Sleep apnea, unspecified: Secondary | ICD-10-CM

## 2018-12-06 DIAGNOSIS — F329 Major depressive disorder, single episode, unspecified: Secondary | ICD-10-CM

## 2018-12-06 NOTE — Patient Instructions (Signed)
Medication Instructions:  Your physician recommends that you continue on your current medications as directed. Please refer to the Current Medication list given to you today. If you need a refill on your cardiac medications before your next appointment, please call your pharmacy.   Lab work: NONE  If you have labs (blood work) drawn today and your tests are completely normal, you will receive your results only by: Marland Kitchen MyChart Message (if you have MyChart) OR . A paper copy in the mail If you have any lab test that is abnormal or we need to change your treatment, we will call you to review the results.  Testing/Procedures: NONE   Follow-Up: At [facility], you and your health needs are our priority.  As part of our continuing mission to provide you with exceptional heart care, we have created designated Provider Care Teams.  These Care Teams include your primary Cardiologist (physician) and Advanced Practice Providers (APPs -  Physician Assistants and Nurse Practitioners) who all work together to provide you with the care you need, when you need it.  . Your physician recommends that you schedule a follow-up appointment in: 6 Months with  Kerin Ransom, PA-C  Any Other Special Instructions Will Be Listed Below (If Applicable). We will send you a list of PCP's If you temperature is over 100 and you still have a cough please go to Urgent Care.

## 2018-12-06 NOTE — Assessment & Plan Note (Signed)
Followed closely by Carly Delacruz- counselor

## 2018-12-06 NOTE — Assessment & Plan Note (Signed)
H/O gastric bypass in 2001-current BMI 45

## 2018-12-06 NOTE — Assessment & Plan Note (Signed)
Unable to tolerate C-pap 

## 2018-12-06 NOTE — Assessment & Plan Note (Signed)
Oct 2019 showed normal LVF, normal wall thickness, grade 1 DD

## 2018-12-06 NOTE — Progress Notes (Signed)
Virtual Visit via Telephone Note   This visit type was conducted due to national recommendations for restrictions regarding the COVID-19 Pandemic (e.g. social distancing) in an effort to limit this patient's exposure and mitigate transmission in our community.  Due to her co-morbid illnesses, this patient is at least at moderate risk for complications without adequate follow up.  This format is felt to be most appropriate for this patient at this time.  The patient did not have access to video technology/had technical difficulties with video requiring transitioning to audio format only (telephone).  All issues noted in this document were discussed and addressed.  No physical exam could be performed with this format.  Please refer to the patient's chart for her  consent to telehealth for [facility].   Date:  12/06/2018   ID:  Carly Delacruz, DOB 11/23/1965, MRN [id number]  Patient Location: Home Provider Location: Home  PCP:  Patient, No Pcp Per  Cardiologist:  Dr Allyson SabalBerry  Electrophysiologist:  None   Evaluation Performed:  Follow-Up Visit  Chief Complaint:  cough  History of Present Illness:    Carly Delacruz is a pleasant 53 y.o.AA femalefollowed by Dr Allyson SabalBerry with a history of chest pain and HTN.  She worked at [employer] for 30 years but has not worked for the past two years.    She has been seen in the past for chest pain.  In October 2019 she had a work-up for chest pain that included a coronary CTA.  This revealed a normal calcium score of 18.  There was some mild proximal LAD narrowing that was ultimately shown to not be significant by FFR.  Echocardiogram showed normal LV function, normal wall thickness, and grade 1 diastolic dysfunction.  She has several other medical issues.  She has hypertension.  She has multiple medication intolerances.  She has depression and is followed closely by her psychiatric counselor.  Currently she is dealing with problems related to her  knees.  She was told she would need a knee replacement, she has an appointment to see Dr. Jodi GeraldsJohn Graves to discuss this further.  From a cardiac standpoint she has been well.  She did develop a cough and says she has has "chills" 24 hours after a flu shot.  I asked her if she could take her temperature at home and she says her thermometer is broken.  I suggested she obtain a thermometer and take her temperature.  If her fever is greater than 100 then she should probably be seen at an urgent care.  If she has increasing shortness of breath she would need to be seen at an urgent care or the emergency room.  She tells me she has no primary care provider, that person moved away.  She was referred to a primary urgent care but they told her her problems were too complex and she tells me they would not see her.  The patient does not have symptoms concerning for COVID-19 infection (fever, chills, cough, or new shortness of breath).    Past Medical History:  Diagnosis Date  . Allergy   . Anemia   . Anxiety   . Arthritis 12/10/03  . Atrophic vaginitis 2009  . BV (bacterial vaginosis) 2006  . Chronic back pain   . Chronic LLQ pain   . Chronic post-traumatic stress disorder (PTSD)   . Complication of anesthesia   . Elevated LFTs   . Endometriosis   . Fatigue 2012  . Gallstones   .  GERD (gastroesophageal reflux disease)   . H/O bladder infections   . H/O mumps   . H/O varicella   . History of candidal vulvovaginitis 03/21/03  . HTN (hypertension)   . Insomnia   . Insomnia secondary to situational depression 06/06/07  . Liver mass, right lobe 08/13/04   Nodule  . Malaise 2012  . Morbid obesity (HCC) 2005  . Osteoporosis   . S/P gastric bypass   . Severe episode of recurrent major depressive disorder, without psychotic features (HCC)   . Sleep apnea 2005  . SUI (stress urinary incontinence, female) 08/12/03  . Trichomonas   . Vulvar lesion 03/21/03  . Yeast infection    Past Surgical History:   Procedure Laterality Date  . ABDOMINAL HYSTERECTOMY  2001  . APPENDECTOMY  2001  . CHOLECYSTECTOMY  2011  . DILATION AND CURETTAGE OF UTERUS    . GASTRIC BYPASS  2007   [facility], Dr. Daphine Deutscher  . KNEE ARTHROSCOPY Left   . LAPAROTOMY    . TONSILLECTOMY    . WISDOM TOOTH EXTRACTION       Current Meds  Medication Sig  . carvedilol (COREG) 6.25 MG tablet Take 1 tablet (6.25 mg total) by mouth 2 (two) times daily.  . cyclobenzaprine (FLEXERIL) 5 MG tablet Take 5 mg by mouth 2 (two) times daily.  . diazepam (VALIUM) 5 MG tablet Place 5 mg vaginally at bedtime.   . famotidine (PEPCID) 20 MG tablet Take 1 tablet (20 mg total) by mouth 2 (two) times daily.  Marland Kitchen FLUoxetine (PROZAC) 40 MG capsule Take 1 capsule (40 mg total) by mouth daily.  . fluticasone (FLONASE) 50 MCG/ACT nasal spray Place 1-2 sprays into both nostrils daily.  . furosemide (LASIX) 20 MG tablet Take 1 tablet (20 mg total) by mouth daily as needed for fluid or edema.  . gabapentin (NEURONTIN) 100 MG capsule Take 100 mg by mouth 2 (two) times daily.  . hydrOXYzine (VISTARIL) 25 MG capsule Take 25 mg by mouth as needed.  Marland Kitchen lisinopril (ZESTRIL) 10 MG tablet Take 1 tablet (10 mg total) by mouth daily.  . potassium chloride (K-DUR,KLOR-CON) 10 MEQ tablet Take 1 tablet (10 mEq total) by mouth daily. As directed with lasix  . predniSONE (DELTASONE) 20 MG tablet Take as directed  . QUEtiapine (SEROQUEL) 25 MG tablet Take 25 mg by mouth at bedtime.  . Vitamin D, Ergocalciferol, (DRISDOL) 1.25 MG (50000 UT) CAPS capsule Take 50,000 Units by mouth every 7 (seven) days.     Allergies:   Morphine and related, Septra [sulfamethoxazole-trimethoprim], Sulfa antibiotics, Sulfamethoxazole-trimethoprim, Sulfasalazine, Ultram [tramadol], Ciprofloxacin, Darvocet [propoxyphene n-acetaminophen], Adhesive [tape], Codeine, Demerol [meperidine], Hydromorphone, and Losartan   Social History   Tobacco Use  . Smoking status: Never Smoker   . Smokeless tobacco: Never Used  Substance Use Topics  . Alcohol use: No  . Drug use: No     Family Hx: The patient's family history includes Arthritis in her mother; Colitis in her sister; Deep vein thrombosis in her sister; Diabetes in her paternal grandmother; Hypertension in her sister; Kidney disease in her brother; Leukemia in her maternal grandmother; Obstructive Sleep Apnea in her brother; Prostate cancer in her father.  ROS:   Please see the history of present illness.    All other systems reviewed and are negative.   Prior CV studies:   The following studies were reviewed today:  Echo Oct 2019  Labs/Other Tests and Data Reviewed:    EKG:  No ECG  reviewed.  Recent Labs: 03/20/2018: BUN 10; Creatinine, Ser 1.04; Hemoglobin 12.3; Platelets 363; Potassium 4.7; Sodium 143   Recent Lipid Panel Lab Results  Component Value Date/Time   CHOL 208 (H) 08/04/2017 10:35 AM   TRIG 83.0 08/04/2017 10:35 AM   HDL 69.70 08/04/2017 10:35 AM   CHOLHDL 3 08/04/2017 10:35 AM   LDLCALC 122 (H) 08/04/2017 10:35 AM    Wt Readings from Last 3 Encounters:  12/06/18 257 lb (116.6 kg)  10/26/18 254 lb 3.2 oz (115.3 kg)  04/10/18 247 lb (112 kg)     Objective:    Vital Signs:  Ht 5\' 3"  (1.6 m)   Wt 257 lb (116.6 kg)   BMI 45.53 kg/m    VITAL SIGNS:  reviewed she tells me her B/P yesterday was 110/86  ASSESSMENT & PLAN:    HTN Oct 2019 showed normal LVF, normal wall thickness, grade 1 DD. B/P controlled  Cough She c/o today of non productive cough 24 hours after flu shot  Morbid obesity H/O gastric bypass in 2001-current BMI 45  Sleep apnea Unable to tolerate C-pap  Depression Followed closely by Alver Fisher- counselor  DJD She was told she will need knee replacement- she is to see Dr Berenice Primas this week  Plan: I suggested she obtain a thermometer and check her temperature.  If her fevers greater than 100 she should probably go to an urgent care.  If her cough  gets worse or she becomes short of breath then she should definitely go to an urgent care or the ER.  Her blood pressure seems to be stable on her current medications.  I considered changing her to an ARB but she has been intolerant to losartan in the past.  Her blood pressure seems to be controlled based on her home readings.  From a cardiac standpoint there is no reason she cannot have knee replacement without further testing.  I doubt she would be scheduled for this though until her cough is cleared.  I would like to see her back in the office in person in 6 months or so.  I have also asked our office staff to try and assist her in finding a PCP.   COVID-19 Education: The signs and symptoms of COVID-19 were discussed with the patient and how to seek care for testing (follow up with PCP or arrange E-visit).  The importance of social distancing was discussed today.  Time:   Today, I have spent 20 minutes with the patient with telehealth technology discussing the above problems.     Medication Adjustments/Labs and Tests Ordered: Current medicines are reviewed at length with the patient today.  Concerns regarding medicines are outlined above.   Tests Ordered: No orders of the defined types were placed in this encounter.   Medication Changes: No orders of the defined types were placed in this encounter.   Follow Up:  In Person F/U with me in 6 months  Signed, Kerin Ransom, PA-C  12/06/2018 8:25 AM    [place]

## 2018-12-06 NOTE — Telephone Encounter (Signed)
Contacted patient to discuss AVS Instructions. Gave patient Carly Delacruz's recommendations from today's virtual office visit. Informed patient that someone from the scheduling dept will be in contact with them to schedule their follow up appt. Patient voiced understanding and AVS mailed.    

## 2018-12-06 NOTE — Assessment & Plan Note (Signed)
She was told she will need knee replacement- she is to see Dr Berenice Primas this week

## 2018-12-06 NOTE — Telephone Encounter (Signed)
Virtual Visit Pre-Appointment Phone Call  "(Name), I am calling you today to discuss your upcoming appointment. We are currently trying to limit exposure to the virus that causes COVID-19 by seeing patients at home rather than in the office."  1. "What is the BEST phone number to call the day of the visit?" - include this in appointment notes  2. "Do you have or have access to (through a family member/friend) a smartphone with video capability that we can use for your visit?" a. If yes - list this number in appt notes as "cell" (if different from BEST phone #) and list the appointment type as a VIDEO visit in appointment notes b. If no - list the appointment type as a PHONE visit in appointment notes  3. Confirm consent - "In the setting of the current Covid19 crisis, you are scheduled for a (phone or video) visit with your provider on (date) at (time).  Just as we do with many in-office visits, in order for you to participate in this visit, we must obtain consent.  If you'd like, I can send this to your mychart (if signed up) or email for you to review.  Otherwise, I can obtain your verbal consent now.  All virtual visits are billed to your insurance company just like a normal visit would be.  By agreeing to a virtual visit, we'd like you to understand that the technology does not allow for your provider to perform an examination, and thus may limit your provider's ability to fully assess your condition. If your provider identifies any concerns that need to be evaluated in person, we will make arrangements to do so.  Finally, though the technology is pretty good, we cannot assure that it will always work on either your or our end, and in the setting of a video visit, we may have to convert it to a phone-only visit.  In either situation, we cannot ensure that we have a secure connection.  Are you willing to proceed?" STAFF: Did the patient verbally acknowledge consent to telehealth visit? Document  YES/NO here: YES  4. Advise patient to be prepared - "Two hours prior to your appointment, go ahead and check your blood pressure, pulse, oxygen saturation, and your weight (if you have the equipment to check those) and write them all down. When your visit starts, your provider will ask you for this information. If you have an Apple Watch or Kardia device, please plan to have heart rate information ready on the day of your appointment. Please have a pen and paper handy nearby the day of the visit as well."  5. Give patient instructions for MyChart download to smartphone OR Doximity/Doxy.me as below if video visit (depending on what platform provider is using)  6. Inform patient they will receive a phone call 15 minutes prior to their appointment time (may be from unknown caller ID) so they should be prepared to answer    TELEPHONE CALL NOTE  Carly Delacruz has been deemed a candidate for a follow-up tele-health visit to limit community exposure during the Covid-19 pandemic. I spoke with the patient via phone to ensure availability of phone/video source, confirm preferred email & phone number, and discuss instructions and expectations.  I reminded Carly Delacruz to be prepared with any vital sign and/or heart rhythm information that could potentially be obtained via home monitoring, at the time of her visit. I reminded Carly Delacruz to expect a phone call prior to  her visit.  [name], CMA 12/06/2018 7:46 AM   INSTRUCTIONS FOR DOWNLOADING THE MYCHART APP TO SMARTPHONE  - The patient must first make sure to have activated MyChart and know their login information - If Apple, go to [employer] and type in MyChart in the search bar and download the app. If Android, ask patient to go to [employer] and type in [place] in the search bar and download the app. The app is free but as with any other app downloads, their phone may require them to verify saved payment information or  Apple/Android password.  - The patient will need to then log into the app with their MyChart username and password, and select [facility] as their healthcare provider to link the account. When it is time for your visit, go to the MyChart app, find appointments, and click Begin Video Visit. Be sure to Select Allow for your device to access the Microphone and Camera for your visit. You will then be connected, and your provider will be with you shortly.  **If they have any issues connecting, or need assistance please contact MyChart service desk [phone])**  **If using a computer, in order to ensure the best quality for their visit they will need to use either of the following Internet Browsers: [employer] or Google Chrome**  IF USING DOXIMITY or DOXY.ME - The patient will receive a link just prior to their visit by text.     FULL LENGTH CONSENT FOR TELE-HEALTH VISIT   I hereby voluntarily request, consent and authorize [facility] and its employed or contracted physicians, physician assistants, nurse practitioners or other licensed health care professionals (the Practitioner), to provide me with telemedicine health care services (the "Services") as deemed necessary by the treating Practitioner. I acknowledge and consent to receive the Services by the Practitioner via telemedicine. I understand that the telemedicine visit will involve communicating with the Practitioner through live audiovisual communication technology and the disclosure of certain medical information by electronic transmission. I acknowledge that I have been given the opportunity to request an in-person assessment or other available alternative prior to the telemedicine visit and am voluntarily participating in the telemedicine visit.  I understand that I have the right to withhold or withdraw my consent to the use of telemedicine in the course of my care at any time, without affecting my right to future care  or treatment, and that the Practitioner or I may terminate the telemedicine visit at any time. I understand that I have the right to inspect all information obtained and/or recorded in the course of the telemedicine visit and may receive copies of available information for a reasonable fee.  I understand that some of the potential risks of receiving the Services via telemedicine include:  Marland Kitchen Delay or interruption in medical evaluation due to technological equipment failure or disruption; . Information transmitted may not be sufficient (e.g. poor resolution of images) to allow for appropriate medical decision making by the Practitioner; and/or  . In rare instances, security protocols could fail, causing a breach of personal health information.  Furthermore, I acknowledge that it is my responsibility to provide information about my medical history, conditions and care that is complete and accurate to the best of my ability. I acknowledge that Practitioner's advice, recommendations, and/or decision may be based on factors not within their control, such as incomplete or inaccurate data provided by me or distortions of diagnostic images or specimens that may result from electronic transmissions.  I understand that the practice of medicine is not an exact science and that Practitioner makes no warranties or guarantees regarding treatment outcomes. I acknowledge that I will receive a copy of this consent concurrently upon execution via email to the email address I last provided but may also request a printed copy by calling the office of [facility].    I understand that my insurance will be billed for this visit.   I have read or had this consent read to me. . I understand the contents of this consent, which adequately explains the benefits and risks of the Services being provided via telemedicine.  . I have been provided ample opportunity to ask questions regarding this consent and the Services and have had  my questions answered to my satisfaction. . I give my informed consent for the services to be provided through the use of telemedicine in my medical care  By participating in this telemedicine visit I agree to the above.

## 2018-12-06 NOTE — Assessment & Plan Note (Signed)
She c/o today of non productive cough 24 hours after flu shot

## 2018-12-06 NOTE — Assessment & Plan Note (Addendum)
H/O gastric bypass in 2001-current BMI 45 

## 2018-12-13 ENCOUNTER — Ambulatory Visit (INDEPENDENT_AMBULATORY_CARE_PROVIDER_SITE_OTHER): Payer: BC Managed Care – PPO | Admitting: Licensed Clinical Social Worker

## 2018-12-13 ENCOUNTER — Encounter (HOSPITAL_COMMUNITY): Payer: Self-pay | Admitting: Licensed Clinical Social Worker

## 2018-12-13 ENCOUNTER — Other Ambulatory Visit: Payer: Self-pay

## 2018-12-13 DIAGNOSIS — F332 Major depressive disorder, recurrent severe without psychotic features: Secondary | ICD-10-CM | POA: Diagnosis not present

## 2018-12-13 NOTE — Progress Notes (Signed)
Virtual Visit via Video Note  I connected with Carly Delacruz on 12/13/18 at 11:00 AM EDT by a video enabled telemedicine application and verified that I am speaking with the correct person using two identifiers.   I discussed the limitations of evaluation and management by telemedicine and the availability of in person appointments. The patient expressed understanding and agreed to proceed.  History of Present Illness: Pt was referred for therapy by psychiatrist for depression and chronic pain.    Observations/Objective: Pt presented sick and in the bed for her individual video enabled telemedicine appointment. Pt discussed her psychiatric symptoms and current life events. Patient reports she has been sick since she received the flu shot, coughing, fever, chills. Asked open ended questions about her symptoms and care, along with suggestions. "I struggle with asking friends to help me, but the dr suggested I get a thermometer or go to Urgent Care to get my temperature taken." Discussed risks and benefits of following dr recommendations.   PLAN: LTD denial         Assessment and Plan: Continue seeing patient bi-weekly webex appointments.   Follow Up Instructions:   I discussed the assessment and treatment plan with the patient. The patient was provided an opportunity to ask questions and all were answered. The patient agreed with the plan and demonstrated an understanding of the instructions.   The patient was advised to call back or seek an in-person evaluation if the symptoms worsen or if the condition fails to improve as anticipated.  I provided 25 minutes of non-face-to-face time during this encounter.   [name],[name], LCAS

## 2018-12-14 DIAGNOSIS — F332 Major depressive disorder, recurrent severe without psychotic features: Secondary | ICD-10-CM | POA: Diagnosis not present

## 2018-12-18 ENCOUNTER — Other Ambulatory Visit: Payer: Self-pay

## 2018-12-18 ENCOUNTER — Ambulatory Visit: Payer: Self-pay

## 2018-12-18 DIAGNOSIS — Z20822 Contact with and (suspected) exposure to covid-19: Secondary | ICD-10-CM

## 2018-12-18 MED FILL — AMITRIPTYLINE HCL 150 MG TA: 150 | 90 days supply | Qty: 90 | Fill #0

## 2018-12-18 MED FILL — NUCYNTA 50 MG TABS: 50 | 30 days supply | Qty: 90 | Fill #0

## 2018-12-18 MED FILL — QUETIAPINE 25 MG TABLET: 25 | 90 days supply | Qty: 180 | Fill #0

## 2018-12-18 MED FILL — HYDROXYZINE PAMOATE 25 MG C: 25 | 90 days supply | Qty: 270 | Fill #0

## 2018-12-18 NOTE — Telephone Encounter (Signed)
Pt. Called to schedule a new patient appt. @ [facility].  Pt. Reported she has had cough for 2 weeks.  Frequent coughing during triage call; cough sounded tight.  C/o shortness of breath with both cough, and with activity.  C/o chest tightness.  Cough is nonproductive.  C/o body aches, fatigue, and tenderness in the upper abdominal area, bilaterally.  Reported thermometer does not work, but she "woke up in a sweat", during the night.  Reported she is taking Nyquil, and Dayquil.  Stated my cough is not getting any better.  Has not traveled recently.  Is not aware of known exposure to COVID.    Advised pt. Due to persistent cough, chest tightness, and shortness of breath, to go to UC or ER, today.  Advised she will need to be examined, and is not able to go into the Physician office for evaluation, due to her current symptoms. Encouraged to go for evaluation today.  Encouraged to call back to schedule appt. To establish care with new PCP.  Verb. Understanding.      Reason for Disposition . MILD difficulty breathing (e.g., minimal/no SOB at rest, SOB with walking, pulse <100)  Answer Assessment - Initial Assessment Questions 1. COVID-19 DIAGNOSIS: "Who made your Coronavirus (COVID-19) diagnosis?" "Was it confirmed by a positive lab test?" If not diagnosed by a HCP, ask "Are there lots of cases (community spread) where you live?" (See public health department website, if unsure)     Has not been tested recently; no diagnosis  2. ONSET: "When did the COVID-19 symptoms start?"     2 weeks ago  3. WORST SYMPTOM: "What is your worst symptom?" (e.g., cough, fever, shortness of breath, muscle aches)     Cough and tenderness in sides in upper abdomen 4. COUGH: "Do you have a cough?" If so, ask: "How bad is the cough?"      Yes; frequent; won't go away  5. FEVER: "Do you have a fever?" If so, ask: "What is your temperature, how was it measured, and when did it start?"     Doesn't feel her thermometer works;  woke up in sweat 6. RESPIRATORY STATUS: "Describe your breathing?" (e.g., shortness of breath, wheezing, unable to speak)      Shortness of breath with activity and with freq. coughing  7. BETTER-SAME-WORSE: "Are you getting better, staying the same or getting worse compared to yesterday?"  If getting worse, ask, "In what way?"     Cough is not improving.  C/o tenderness in upper abdomen, bilaterally  8. HIGH RISK DISEASE: "Do you have any chronic medical problems?" (e.g., asthma, heart or lung disease, weak immune system, etc.)     No chronic lung disease  9. PREGNANCY: "Is there any chance you are pregnant?" "When was your last menstrual period?"    N/a  10. OTHER SYMPTOMS: "Do you have any other symptoms?"  (e.g., chills, fatigue, headache, loss of smell or taste, muscle pain, sore throat)      C/o body aches; fatigue; shortness of breath with activity; chest tightness, and tenderness in upper abdomen, bilaterally  Protocols used: CORONAVIRUS (COVID-19) DIAGNOSED OR SUSPECTED-A-AH

## 2018-12-20 LAB — NOVEL CORONAVIRUS, NAA: SARS-CoV-2, NAA: NOT DETECTED

## 2018-12-22 ENCOUNTER — Telehealth: Payer: Self-pay | Admitting: General Practice

## 2018-12-22 NOTE — Telephone Encounter (Signed)
Gave patient negative covid test results Patient understood 

## 2018-12-26 ENCOUNTER — Ambulatory Visit (INDEPENDENT_AMBULATORY_CARE_PROVIDER_SITE_OTHER): Payer: BC Managed Care – PPO | Admitting: Licensed Clinical Social Worker

## 2018-12-26 ENCOUNTER — Other Ambulatory Visit: Payer: Self-pay

## 2018-12-26 ENCOUNTER — Encounter (HOSPITAL_COMMUNITY): Payer: Self-pay | Admitting: Licensed Clinical Social Worker

## 2018-12-26 DIAGNOSIS — F332 Major depressive disorder, recurrent severe without psychotic features: Secondary | ICD-10-CM | POA: Diagnosis not present

## 2018-12-26 NOTE — Progress Notes (Signed)
Virtual Visit via Video Note  I connected with Carly Delacruz on 12/26/18 at 11:00 AM EDT by a video enabled telemedicine application and verified that I am speaking with the correct person using two identifiers.   I discussed the limitations of evaluation and management by telemedicine and the availability of in person appointments. The patient expressed understanding and agreed to proceed.  History of Present Illness: Pt was referred for therapy by psychiatrist for depression and chronic pain.    Observations/Objective: Pt presented sick again for her individual video enabled telemedicine appointment. Pt discussed her psychiatric symptoms and current life events. Patient reports she had a Covid test and it was negative. Patient is still struggling to find a PCP. Her insurance is changing to [facility] 12/1 so will have to refer her to another therapist in the community. Discussed with patient her feelings surrounding being referred to another therapist. Patient also reports her psychiatrist, Dr. Daron Offer, may not be accepting medicare either. Processed this with patient, maybe losing both mental health providers. Validated patient's feelings. Since patient was sick during session, kept the session at 30 minutes. Made new appointments.   PLAN: LTD denial   Assessment and Plan: Continue seeing patient bi-weekly webex appointments.   Follow Up Instructions:   I discussed the assessment and treatment plan with the patient. The patient was provided an opportunity to ask questions and all were answered. The patient agreed with the plan and demonstrated an understanding of the instructions.   The patient was advised to call back or seek an in-person evaluation if the symptoms worsen or if the condition fails to improve as anticipated.  I provided 30 minutes of non-face-to-face time during this encounter.   Kristene Liberati S, LCAS

## 2018-12-27 DIAGNOSIS — N9489 Other specified conditions associated with female genital organs and menstrual cycle: Secondary | ICD-10-CM | POA: Diagnosis not present

## 2018-12-27 DIAGNOSIS — R3915 Urgency of urination: Secondary | ICD-10-CM | POA: Diagnosis not present

## 2018-12-27 DIAGNOSIS — N398 Other specified disorders of urinary system: Secondary | ICD-10-CM | POA: Diagnosis not present

## 2018-12-27 MED FILL — diazePAM 5 MG TABS: 5 | 60 days supply | Qty: 60 | Fill #0

## 2019-01-02 DIAGNOSIS — G894 Chronic pain syndrome: Secondary | ICD-10-CM | POA: Diagnosis not present

## 2019-01-02 DIAGNOSIS — G4700 Insomnia, unspecified: Secondary | ICD-10-CM | POA: Diagnosis not present

## 2019-01-02 DIAGNOSIS — M461 Sacroiliitis, not elsewhere classified: Secondary | ICD-10-CM | POA: Diagnosis not present

## 2019-01-02 DIAGNOSIS — M47816 Spondylosis without myelopathy or radiculopathy, lumbar region: Secondary | ICD-10-CM | POA: Diagnosis not present

## 2019-01-02 MED FILL — CYCLOBENZAPRINE HCL 5 MG TA: 5 | 30 days supply | Qty: 60 | Fill #0

## 2019-01-08 MED FILL — diazePAM 5 MG TABS: 5 | 60 days supply | Qty: 60 | Fill #0

## 2019-01-09 ENCOUNTER — Ambulatory Visit (INDEPENDENT_AMBULATORY_CARE_PROVIDER_SITE_OTHER): Payer: BC Managed Care – PPO | Admitting: Licensed Clinical Social Worker

## 2019-01-09 ENCOUNTER — Encounter (HOSPITAL_COMMUNITY): Payer: Self-pay | Admitting: Licensed Clinical Social Worker

## 2019-01-09 ENCOUNTER — Telehealth (HOSPITAL_COMMUNITY): Payer: Self-pay | Admitting: Licensed Clinical Social Worker

## 2019-01-09 ENCOUNTER — Other Ambulatory Visit: Payer: Self-pay

## 2019-01-09 DIAGNOSIS — F332 Major depressive disorder, recurrent severe without psychotic features: Secondary | ICD-10-CM | POA: Diagnosis not present

## 2019-01-09 NOTE — Telephone Encounter (Signed)
Pt did not present for webex session. Called and left vm.  Apryle Stowell, LCAS

## 2019-01-09 NOTE — Progress Notes (Signed)
Virtual Visit via Phone Note  I connected with Carly Delacruz on 01/09/19 at  3:00 PM EST by a phone enabled telemedicine application and verified that I am speaking with the correct person using two identifiers.   I discussed the limitations of evaluation and management by telemedicine and the availability of in person appointments. The patient expressed understanding and agreed to proceed.  History of Present Illness: Pt was referred for therapy by psychiatrist for depression and chronic pain.    Observations/Objective: Pt presented depressed for her individual phone enabled telemedicine appointment. Pt discussed her psychiatric symptoms and current life events. Patient discusses her depressive symptoms have increased because of her continued appeal of her LTD through [employer]." I didn't want to be disabled. I still wanted to be working at [employer]." Used CBT to help patient in challenging and changing her unhelpful cognitive distortions and behaviors, improving her emotional regulation, and the development of her coping strategies that target solving her current problems.   PLAN: discuss LTD denial   Assessment and Plan: Continue seeing patient bi-weekly webex appointments.   Follow Up Instructions:   I discussed the assessment and treatment plan with the patient. The patient was provided an opportunity to ask questions and all were answered. The patient agreed with the plan and demonstrated an understanding of the instructions.   The patient was advised to call back or seek an in-person evaluation if the symptoms worsen or if the condition fails to improve as anticipated.  I provided 45 minutes of non-face-to-face time during this encounter.   Carly Delacruz, LCAS

## 2019-01-13 MED FILL — CARVEDILOL 6.25 MG TABLET: 6.25 | 30 days supply | Qty: 60 | Fill #1

## 2019-01-15 MED FILL — NUCYNTA 50 MG TABS: 50 | 30 days supply | Qty: 90 | Fill #0

## 2019-01-16 MED FILL — FLUoxetine HCL 40 MG CAPS: 40 | 90 days supply | Qty: 90 | Fill #0

## 2019-01-23 ENCOUNTER — Ambulatory Visit (INDEPENDENT_AMBULATORY_CARE_PROVIDER_SITE_OTHER): Payer: BC Managed Care – PPO | Admitting: Licensed Clinical Social Worker

## 2019-01-23 ENCOUNTER — Encounter (HOSPITAL_COMMUNITY): Payer: Self-pay | Admitting: Licensed Clinical Social Worker

## 2019-01-23 ENCOUNTER — Other Ambulatory Visit: Payer: Self-pay | Admitting: Cardiovascular Disease

## 2019-01-23 ENCOUNTER — Other Ambulatory Visit: Payer: Self-pay

## 2019-01-23 DIAGNOSIS — F332 Major depressive disorder, recurrent severe without psychotic features: Secondary | ICD-10-CM

## 2019-01-23 DIAGNOSIS — I10 Essential (primary) hypertension: Secondary | ICD-10-CM

## 2019-01-23 MED ORDER — FUROSEMIDE 20 MG PO TABS: 20.0000 mg | ORAL_TABLET | Freq: Every day | ORAL | 3 refills | Status: DC | PRN

## 2019-01-23 MED ORDER — CARVEDILOL 6.25 MG PO TABS: 6.2500 mg | ORAL_TABLET | Freq: Two times a day (BID) | ORAL | 1 refills | Status: DC

## 2019-01-23 MED ORDER — LISINOPRIL 10 MG PO TABS: 10.0000 mg | ORAL_TABLET | Freq: Every day | ORAL | 3 refills | Status: DC

## 2019-01-23 NOTE — Progress Notes (Signed)
Virtual Visit via Phone Note  I connected with Carly Delacruz on 01/23/19 at 11:00 AM EST by a phone enabled telemedicine application and verified that I am speaking with the correct person using two identifiers.   I discussed the limitations of evaluation and management by telemedicine and the availability of in person appointments. The patient expressed understanding and agreed to proceed.  History of Present Illness: Pt was referred for therapy by psychiatrist for depression and chronic pain.    Observations/Objective: Pt presented depressed for her individual phone enabled telemedicine appointment. Pt discussed her psychiatric symptoms and current life events. Patient discussed her Thanksgiving plans. Patient discussed her current stressors and coping skills. "I need a purpose." Discussed with patient how to find purpose during a pandemic. Patient has medicare effective 12/1. Again, discussed need to refer patient to a provider in the community. Will provide information.  PLAN: refer to community provider   Assessment and Plan: Continue seeing patient bi-weekly webex appointments.   Follow Up Instructions:   I discussed the assessment and treatment plan with the patient. The patient was provided an opportunity to ask questions and all were answered. The patient agreed with the plan and demonstrated an understanding of the instructions.   The patient was advised to call back or seek an in-person evaluation if the symptoms worsen or if the condition fails to improve as anticipated.  I provided 45 minutes of non-face-to-face time during this encounter.   Bereket Gernert S, LCAS

## 2019-01-23 NOTE — Telephone Encounter (Signed)
Rx request sent to pharmacy.  

## 2019-02-06 MED FILL — FAMOTIDINE 20 MG TABS: 20 | 30 days supply | Qty: 60 | Fill #1 | Status: TO

## 2019-02-06 MED FILL — GABAPENTIN 300 MG CAPSULE: 300 | 90 days supply | Qty: 270 | Fill #0

## 2019-02-06 MED FILL — GABAPENTIN 100 MG CAPSULE: 100 | 90 days supply | Qty: 270 | Fill #0 | Status: TO

## 2019-02-06 MED FILL — CYCLOBENZAPRINE HCL 5 MG TA: 5 | 30 days supply | Qty: 60 | Fill #1 | Status: TO

## 2019-02-08 MED FILL — LISINOPRIL 10 MG TABS: 10 | 90 days supply | Qty: 90 | Fill #2 | Status: TO

## 2019-02-08 MED FILL — diazePAM 5 MG TABS: 5 | 30 days supply | Qty: 30 | Fill #0

## 2019-03-05 DIAGNOSIS — M25562 Pain in left knee: Secondary | ICD-10-CM | POA: Diagnosis not present

## 2019-03-05 DIAGNOSIS — M1711 Unilateral primary osteoarthritis, right knee: Secondary | ICD-10-CM | POA: Diagnosis not present

## 2019-03-05 DIAGNOSIS — M1712 Unilateral primary osteoarthritis, left knee: Secondary | ICD-10-CM | POA: Diagnosis not present

## 2019-03-05 DIAGNOSIS — M25561 Pain in right knee: Secondary | ICD-10-CM | POA: Diagnosis not present

## 2019-03-06 DIAGNOSIS — M461 Sacroiliitis, not elsewhere classified: Secondary | ICD-10-CM | POA: Diagnosis not present

## 2019-03-06 DIAGNOSIS — G4700 Insomnia, unspecified: Secondary | ICD-10-CM | POA: Diagnosis not present

## 2019-03-06 DIAGNOSIS — G894 Chronic pain syndrome: Secondary | ICD-10-CM | POA: Diagnosis not present

## 2019-03-06 DIAGNOSIS — M47816 Spondylosis without myelopathy or radiculopathy, lumbar region: Secondary | ICD-10-CM | POA: Diagnosis not present

## 2019-03-06 MED FILL — diazePAM 5 MG TABS: 5 | 60 days supply | Qty: 60 | Fill #1

## 2019-03-06 MED FILL — CARVEDILOL 6.25 MG TABLET: 6.25 | 90 days supply | Qty: 180 | Fill #0

## 2019-03-06 MED FILL — FLUoxetine HCL 40 MG CAPS: 40 | 90 days supply | Qty: 90 | Fill #0

## 2019-03-06 MED FILL — CYCLOBENZAPRINE HCL 5 MG TA: 5 | 30 days supply | Qty: 60 | Fill #2 | Status: TO

## 2019-03-06 MED FILL — FAMOTIDINE 20 MG TABS: 20 | 30 days supply | Qty: 60 | Fill #2 | Status: TO

## 2019-03-06 MED FILL — HYDROXYZINE PAMOATE 25 MG C: 25 | 90 days supply | Qty: 270 | Fill #0

## 2019-03-06 MED FILL — FUROSEMIDE 20 MG TABS: 20 | 90 days supply | Qty: 90 | Fill #0

## 2019-03-09 MED FILL — NUCYNTA 50 MG TABS: 50 | 30 days supply | Qty: 90 | Fill #0

## 2019-03-16 MED FILL — QUETIAPINE 25 MG TABLET: 25 | 30 days supply | Qty: 30 | Fill #0

## 2019-03-16 MED FILL — AMITRIPTYLINE HCL 25 MG TAB: 25 | 30 days supply | Qty: 30 | Fill #0

## 2019-03-26 MED FILL — ARIPIPRAZOLE 2 MG TABS: 2 | 90 days supply | Qty: 90 | Fill #0

## 2019-03-27 DIAGNOSIS — M461 Sacroiliitis, not elsewhere classified: Secondary | ICD-10-CM | POA: Diagnosis not present

## 2019-03-27 DIAGNOSIS — M47816 Spondylosis without myelopathy or radiculopathy, lumbar region: Secondary | ICD-10-CM | POA: Diagnosis not present

## 2019-03-27 DIAGNOSIS — G4700 Insomnia, unspecified: Secondary | ICD-10-CM | POA: Diagnosis not present

## 2019-03-27 DIAGNOSIS — M7061 Trochanteric bursitis, right hip: Secondary | ICD-10-CM | POA: Diagnosis not present

## 2019-03-27 DIAGNOSIS — G894 Chronic pain syndrome: Secondary | ICD-10-CM | POA: Diagnosis not present

## 2019-03-27 MED FILL — tiZANidine HCL 4 MG TABS: 4 | 90 days supply | Qty: 270 | Fill #0

## 2019-04-09 DIAGNOSIS — G894 Chronic pain syndrome: Secondary | ICD-10-CM | POA: Diagnosis not present

## 2019-04-09 DIAGNOSIS — M47816 Spondylosis without myelopathy or radiculopathy, lumbar region: Secondary | ICD-10-CM | POA: Diagnosis not present

## 2019-04-09 DIAGNOSIS — G4700 Insomnia, unspecified: Secondary | ICD-10-CM | POA: Diagnosis not present

## 2019-04-09 DIAGNOSIS — M461 Sacroiliitis, not elsewhere classified: Secondary | ICD-10-CM | POA: Diagnosis not present

## 2019-04-10 MED FILL — NUCYNTA ER 50 MG TB12: 50 | 30 days supply | Qty: 60 | Fill #0

## 2019-05-03 ENCOUNTER — Ambulatory Visit: Payer: Self-pay | Admitting: Cardiovascular Disease

## 2019-05-04 MED FILL — diazePAM 5 MG TABS: 5 | 60 days supply | Qty: 60 | Fill #2

## 2019-05-09 DIAGNOSIS — G4700 Insomnia, unspecified: Secondary | ICD-10-CM | POA: Diagnosis not present

## 2019-05-09 DIAGNOSIS — M461 Sacroiliitis, not elsewhere classified: Secondary | ICD-10-CM | POA: Diagnosis not present

## 2019-05-09 DIAGNOSIS — M47816 Spondylosis without myelopathy or radiculopathy, lumbar region: Secondary | ICD-10-CM | POA: Diagnosis not present

## 2019-05-09 DIAGNOSIS — G894 Chronic pain syndrome: Secondary | ICD-10-CM | POA: Diagnosis not present

## 2019-05-09 MED FILL — NUCYNTA ER 50 MG TB12: 50 | 30 days supply | Qty: 60 | Fill #0

## 2019-05-09 MED FILL — traZODone HCL 50 MG TABS: 50 | 30 days supply | Qty: 30 | Fill #0

## 2019-05-24 ENCOUNTER — Ambulatory Visit: Payer: Self-pay | Admitting: *Deleted

## 2019-05-24 NOTE — Telephone Encounter (Signed)
Patient calls to inquire whether she should keep her Covid 19 vaccine apointment for tomorrow. She is recovering from pneumonia and now is having some diarrhea over the last two days. No other complaints. Care advice including immodium OTC/foods with high fiber content/4-5 small meals daily to replace 3 bigger meals/increase fluid intake during this time. Watch for bloody/mucous in stool. If no improvement after 2-3 days call your provider. Reviewed urgent symptoms that would need immediate attention including dizziness/headaches/SOB/fever/increased stools. Stated she understood.Cancelled tomorrow's vaccine appointment and call back to reschedule once feeling better and diarrhea resolved.   Reason for Disposition . COVID-19 vaccine, Frequently Asked Questions (FAQs)  Answer Assessment - Initial Assessment Questions 1. MAIN CONCERN OR SYMPTOM:  "What is your main concern right now?" "What question do you have?" "What's the main symptom you're worried about?" (e.g., fever, pain, redness, swelling)     Patient has pneumonia and diarrhea at this time. 2. VACCINE: "What vaccination did you receive?" "Is this your first or second shot?" (e.g., none; Moderna, Pfizer, other)     Has not received the Covid 19 vaccine yet. 3. SYMPTOM ONSET: "When did the  begin?" (e.g., not relevant; hours, days)      2 days ago 4. SYMPTOM SEVERITY: "How bad is it?"       5. FEVER: "Is there a fever?" If so, ask: "What is it, how was it measured, and when did it start?"      no 6. PAST REACTIONS: "Have you reacted to immunizations before?" If so, ask: "What happened?"     na 7. OTHER SYMPTOMS: "Do you have any other symptoms?"     no  Protocols used: CORONAVIRUS (COVID-19) VACCINE QUESTIONS AND REACTIONS-A-AH

## 2019-05-25 ENCOUNTER — Ambulatory Visit: Payer: Self-pay

## 2019-05-30 DIAGNOSIS — R05 Cough: Secondary | ICD-10-CM | POA: Diagnosis not present

## 2019-05-30 MED FILL — MONTELUKAST SOD 10 MG TAB: 10 | 30 days supply | Qty: 30 | Fill #0

## 2019-06-05 ENCOUNTER — Encounter: Payer: Self-pay | Admitting: Cardiovascular Disease

## 2019-06-05 ENCOUNTER — Ambulatory Visit: Payer: Medicare Other | Admitting: Cardiovascular Disease

## 2019-06-05 ENCOUNTER — Other Ambulatory Visit: Payer: Self-pay | Admitting: Cardiovascular Disease

## 2019-06-05 ENCOUNTER — Other Ambulatory Visit: Payer: Self-pay

## 2019-06-05 DIAGNOSIS — I10 Essential (primary) hypertension: Secondary | ICD-10-CM

## 2019-06-05 DIAGNOSIS — E782 Mixed hyperlipidemia: Secondary | ICD-10-CM

## 2019-06-05 DIAGNOSIS — E785 Hyperlipidemia, unspecified: Secondary | ICD-10-CM | POA: Insufficient documentation

## 2019-06-05 MED ORDER — ATORVASTATIN CALCIUM 20 MG PO TABS: 20.0000 mg | ORAL_TABLET | Freq: Every day | ORAL | 3 refills | Status: DC

## 2019-06-05 MED ORDER — CARVEDILOL 12.5 MG PO TABS: 12.5000 mg | ORAL_TABLET | Freq: Two times a day (BID) | ORAL | 3 refills | Status: DC

## 2019-06-05 MED FILL — CARVEDILOL 12.5 MG TABLET: 12.5 | 90 days supply | Qty: 180 | Fill #0

## 2019-06-05 MED FILL — ATORVASTATIN 20 MG TABLET: 20 | 90 days supply | Qty: 90 | Fill #0

## 2019-06-05 NOTE — Assessment & Plan Note (Signed)
History of mild hyperlipidemia with lipid profile performed 08/04/2017 revealing total cholesterol 208 with an LDL of 122 and HDL of 69.  She does have a coronary calcium score of 18.  Can add low-dose atorvastatin 20 mg a day and we will recheck a lipid liver profile in 2 months

## 2019-06-05 NOTE — Progress Notes (Signed)
06/05/2019 [name]   1965-09-27  [id number]  Primary Physician Patient, No Pcp Per Primary Cardiologist: Runell Gess MD Milagros Loll, [place], [place]  HPI:  Carly Delacruz is a 54 y.o.  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  She stopped working in June of 2018.She was referred by Catarina Hartshorn, NP, for evaluation of chest pain. She was seen by Rollene Rotunda 04/29/2016 for similar problems. Work-up was recommended but never pursued.  I last saw her for a virtual telemedicine phone visit 05/30/2018. There is no family history of heart disease. She is never had a heart attack or stroke. She does have a history of obstructive sleep apnea but does not wear CPAP currently because of weight loss as a result of gastric bypass surgery in 2001 when she decreased her weight from 315 down to 250. She does have a history of depression, and GERD. She has treated hypertension as well. She was seen in the emergency room on 12/04/2017 for chest pain. Her d-dimer was mildly elevated which resulted in venous Dopplers which were negative and chest CT which was negative as well for PE or dissection.   I did get a 2D echo which was completely normal and a coronary CTA that revealed a coronary calcium score of 18 with mild proximal LAD the stenosis that was ultimately shown to be not physiologically significant by FFR.  She had an episode of musculoskeletal chest pain earlier this year when stretching internal light but other than that has been relatively asymptomatic.  She did have a febrile illness several weeks ago characterized by fever, nausea vomiting and diarrhea as well as cough which has since resolved.  Since I saw her a year ago she continues to do well.  She does have a cough which may likely be from her lisinopril.  We will discontinue this.  She also  has mild hyperlipidemia which which we will address as well.  She denies chest pain or shortness of breath or edema.   Current Meds  Medication Sig  . carvedilol (COREG) 6.25 MG tablet Take 1 tablet (6.25 mg total) by mouth 2 (two) times daily.  . cyclobenzaprine (FLEXERIL) 5 MG tablet Take 5 mg by mouth 2 (two) times daily.  . diazepam (VALIUM) 5 MG tablet Place 5 mg vaginally at bedtime.   . famotidine (PEPCID) 20 MG tablet Take 1 tablet (20 mg total) by mouth 2 (two) times daily.  Marland Kitchen FLUoxetine (PROZAC) 40 MG capsule Take 1 capsule (40 mg total) by mouth daily.  . fluticasone (FLONASE) 50 MCG/ACT nasal spray Place 1-2 sprays into both nostrils daily.  . furosemide (LASIX) 20 MG tablet Take 1 tablet (20 mg total) by mouth daily as needed for fluid or edema.  . gabapentin (NEURONTIN) 100 MG capsule Take 100 mg by mouth 2 (two) times daily.  . hydrOXYzine (VISTARIL) 25 MG capsule Take 25 mg by mouth as needed.  Marland Kitchen lisinopril (ZESTRIL) 10 MG tablet Take 1 tablet (10 mg total) by mouth daily.  . potassium chloride (K-DUR,KLOR-CON) 10 MEQ tablet Take 1 tablet (10 mEq total) by mouth daily. As directed with lasix  . predniSONE (DELTASONE) 20 MG tablet Take as directed  . QUEtiapine (SEROQUEL) 25 MG tablet Take 25 mg by mouth at bedtime.  . tapentadol (NUCYNTA) 50 MG tablet Take 50 mg by mouth 3 (three)  times daily.  . Vitamin D, Ergocalciferol, (DRISDOL) 1.25 MG (50000 UT) CAPS capsule Take 50,000 Units by mouth every 7 (seven) days.     Allergies  Allergen Reactions  . Morphine And Related Shortness Of Breath and Itching  . Septra [Sulfamethoxazole-Trimethoprim] Anaphylaxis and Hives  . Sulfa Antibiotics Anaphylaxis, Hives and Swelling    Throat swelling  . Sulfamethoxazole-Trimethoprim Anaphylaxis and Hives  . Sulfasalazine Anaphylaxis, Hives, Swelling and Other (See Comments)    Throat swelling  . Ultram [Tramadol] Shortness Of Breath, Anxiety and Other (See Comments)    "panic  attacks"   . Ciprofloxacin Other (See Comments)    This medication was given along with a sulfa. Reaction is unknown at this time. Must list due to possible allergy.  Carlton Adam [Propoxyphene N-Acetaminophen] Itching  . Adhesive [Tape] Rash  . Codeine Itching and Other (See Comments)    No change when benadryl is given  . Demerol [Meperidine] Nausea Only  . Hydromorphone Itching and Other (See Comments)    No change when benadryl is given  . Losartan Palpitations    Social History   Socioeconomic History  . Marital status: Single    Spouse name: Not on file  . Number of children: 0  . Years of education: Not on file  . Highest education level: Bachelor's degree (e.g., BA, AB, BS)  Occupational History  . Occupation: DISABILITY  Tobacco Use  . Smoking status: Never Smoker  . Smokeless tobacco: Never Used  Substance and Sexual Activity  . Alcohol use: No  . Drug use: No  . Sexual activity: Never    Birth control/protection: Surgical    Comment: hysterectomy   Other Topics Concern  . Not on file  Social History Narrative   Patient is singel. Her mother is living with her in a one level house, 2 steps to enter.    Social Determinants of Health   Financial Resource Strain:   . Difficulty of Paying Living Expenses:   Food Insecurity:   . Worried About Charity fundraiser in the Last Year:   . Arboriculturist in the Last Year:   Transportation Needs:   . Film/video editor (Medical):   Marland Kitchen Lack of Transportation (Non-Medical):   Physical Activity:   . Days of Exercise per Week:   . Minutes of Exercise per Session:   Stress:   . Feeling of Stress :   Social Connections:   . Frequency of Communication with Friends and Family:   . Frequency of Social Gatherings with Friends and Family:   . Attends Religious Services:   . Active Member of Clubs or Organizations:   . Attends Archivist Meetings:   Marland Kitchen Marital Status:   Intimate Partner Violence:   . Fear of  Current or Ex-Partner:   . Emotionally Abused:   Marland Kitchen Physically Abused:   . Sexually Abused:      Review of Systems: General: negative for chills, fever, night sweats or weight changes.  Cardiovascular: negative for chest pain, dyspnea on exertion, edema, orthopnea, palpitations, paroxysmal nocturnal dyspnea or shortness of breath Dermatological: negative for rash Respiratory: negative for cough or wheezing Urologic: negative for hematuria Abdominal: negative for nausea, vomiting, diarrhea, bright red blood per rectum, melena, or hematemesis Neurologic: negative for visual changes, syncope, or dizziness All other systems reviewed and are otherwise negative except as noted above.    Blood pressure (!) 146/90, pulse 77, height 5' 3.5" (1.613 m), weight 264 lb (119.7  kg).  General appearance: alert and no distress Neck: no adenopathy, no carotid bruit, no JVD, supple, symmetrical, trachea midline and thyroid not enlarged, symmetric, no tenderness/mass/nodules Lungs: clear to auscultation bilaterally Heart: regular rate and rhythm, S1, S2 normal, no murmur, click, rub or gallop Extremities: extremities normal, atraumatic, no cyanosis or edema Pulses: 2+ and symmetric Skin: Skin color, texture, turgor normal. No rashes or lesions Neurologic: Alert and oriented X 3, normal strength and tone. Normal symmetric reflexes. Normal coordination and gait  EKG sinus rhythm 72 with poor R wave progression.  I personally reviewed this EKG.  ASSESSMENT AND PLAN:   Essential hypertension History of essential hypertension with blood pressure measured today 146/90.  She is on carvedilol and lisinopril.  He is she is complaining of an ACE cough and is intolerant to losartan.  I am going to increase her carvedilol from 6.25 to 12.5 mg p.o. twice daily.  She will keep a blood pressure log for next 30 days and follow-up with Baxter Hire to review make appropriate changes.  Hyperlipidemia History of mild  hyperlipidemia with lipid profile performed 08/04/2017 revealing total cholesterol 208 with an LDL of 122 and HDL of 69.  She does have a coronary calcium score of 18.  Can add low-dose atorvastatin 20 mg a day and we will recheck a lipid liver profile in 2 months      Runell Gess MD [facility], [facility] 06/05/2019 9:31 AM

## 2019-06-05 NOTE — Patient Instructions (Signed)
Your physician has recommended you make the following change in your medication: STOP LISINOPRIL  INCREASE CARVEDILOL TO 12.5 MG TWICE DAILY START ATORVASTATIN 20 MG EVERY PM   Your physician recommends that you return for lab work in:  2 MONTHS FASTING LIPID AND LIVER AFTER STARTING ATORVASTATIN   Your physician has requested that you regularly monitor and record your blood pressure readings at home. Please use the same machine at the same time of day to check your readings and record them to bring to your follow-up visit.  CHECK B/P FOR 30 DAYS Your physician recommends that you schedule a follow-up appointment in:  30 DAYS WITH KRISTIN ALVSTAD PHARM D 3 MONTHS WITH JESSE CLEAVER NP  YEAR WITH DR Allyson Sabal

## 2019-06-05 NOTE — Assessment & Plan Note (Signed)
History of essential hypertension with blood pressure measured today 146/90.  She is on carvedilol and lisinopril.  He is she is complaining of an ACE cough and is intolerant to losartan.  I am going to increase her carvedilol from 6.25 to 12.5 mg p.o. twice daily.  She will keep a blood pressure log for next 30 days and follow-up with Baxter Hire to review make appropriate changes.

## 2019-06-06 DIAGNOSIS — Z8709 Personal history of other diseases of the respiratory system: Secondary | ICD-10-CM | POA: Diagnosis not present

## 2019-06-06 DIAGNOSIS — R05 Cough: Secondary | ICD-10-CM | POA: Diagnosis not present

## 2019-06-06 MED FILL — GABAPENTIN 100 MG CAPSULE: 100 | 90 days supply | Qty: 270 | Fill #0

## 2019-06-06 MED FILL — traZODone HCL 50 MG TABS: 50 | 30 days supply | Qty: 30 | Fill #1

## 2019-06-07 DIAGNOSIS — G894 Chronic pain syndrome: Secondary | ICD-10-CM | POA: Diagnosis not present

## 2019-06-07 DIAGNOSIS — M47816 Spondylosis without myelopathy or radiculopathy, lumbar region: Secondary | ICD-10-CM | POA: Diagnosis not present

## 2019-06-07 DIAGNOSIS — G4700 Insomnia, unspecified: Secondary | ICD-10-CM | POA: Diagnosis not present

## 2019-06-07 DIAGNOSIS — M461 Sacroiliitis, not elsewhere classified: Secondary | ICD-10-CM | POA: Diagnosis not present

## 2019-06-07 DIAGNOSIS — Z79891 Long term (current) use of opiate analgesic: Secondary | ICD-10-CM | POA: Diagnosis not present

## 2019-06-07 MED FILL — NUCYNTA ER 50 MG TB12: 50 | 30 days supply | Qty: 60 | Fill #0

## 2019-06-07 MED FILL — HYDROXYZINE PAMOATE 25 MG C: 25 | 90 days supply | Qty: 270 | Fill #0

## 2019-06-11 ENCOUNTER — Ambulatory Visit: Payer: Medicare Other | Attending: Internal Medicine

## 2019-06-11 DIAGNOSIS — Z23 Encounter for immunization: Secondary | ICD-10-CM

## 2019-06-11 NOTE — Progress Notes (Signed)
   Covid-19 Vaccination Clinic  Name:  Carly Delacruz    MRN: [id number] DOB: [birth year]  06/11/2019  [name] was observed post Covid-19 immunization for 30 minutes based on pre-vaccination screening without incident. She was provided with Vaccine Information Sheet and instruction to access the V-Safe system.   [name] was instructed to call 911 with any severe reactions post vaccine: Marland Kitchen Difficulty breathing  . Swelling of face and throat  . A fast heartbeat  . A bad rash all over body  . Dizziness and weakness   Immunizations Administered    Name Date Dose VIS Date Route   Pfizer COVID-19 Vaccine 06/11/2019 11:52 AM 0.3 mL 02/09/2019 Intramuscular   Manufacturer: [employer], [employer]   Lot: [id number]   NDC: [address]-2

## 2019-06-11 NOTE — Progress Notes (Signed)
   Covid-19 Vaccination Clinic  Name:  MARCHELLA HIBBARD    MRN: [id number] DOB: 1965/03/28  06/11/2019  Ms. Leavens was observed post Covid-19 immunization for 15 minutes without incident. She was provided with Vaccine Information Sheet and instruction to access the V-Safe system.   Ms. Darin was instructed to call 911 with any severe reactions post vaccine: Marland Kitchen Difficulty breathing  . Swelling of face and throat  . A fast heartbeat  . A bad rash all over body  . Dizziness and weakness   Immunizations Administered    Name Date Dose VIS Date Route   Pfizer COVID-19 Vaccine 06/11/2019 11:52 AM 0.3 mL 02/09/2019 Intramuscular   Manufacturer: [employer], [employer]   Lot: [id number]   NDC: [address]-8

## 2019-06-19 ENCOUNTER — Other Ambulatory Visit: Payer: Self-pay | Admitting: Family Medicine

## 2019-06-19 DIAGNOSIS — R05 Cough: Secondary | ICD-10-CM

## 2019-06-19 DIAGNOSIS — R059 Cough, unspecified: Secondary | ICD-10-CM

## 2019-06-25 ENCOUNTER — Other Ambulatory Visit: Payer: Self-pay | Admitting: Cardiovascular Disease

## 2019-06-25 ENCOUNTER — Other Ambulatory Visit: Payer: Self-pay | Admitting: Family

## 2019-06-25 DIAGNOSIS — K219 Gastro-esophageal reflux disease without esophagitis: Secondary | ICD-10-CM

## 2019-06-25 MED FILL — tiZANidine HCL 4 MG TABS: 4 | 90 days supply | Qty: 270 | Fill #0

## 2019-06-25 MED FILL — FLUoxetine HCL 40 MG CAPS: 40 | 90 days supply | Qty: 90 | Fill #0

## 2019-06-25 MED FILL — ARIPIPRAZOLE 5 MG TABS: 5 | 90 days supply | Qty: 90 | Fill #0

## 2019-06-25 MED FILL — traZODone HCL 100 MG TABS: 100 | 90 days supply | Qty: 180 | Fill #0

## 2019-06-25 NOTE — Telephone Encounter (Signed)
Rx request sent to pharmacy.  

## 2019-06-27 DIAGNOSIS — N398 Other specified disorders of urinary system: Secondary | ICD-10-CM | POA: Diagnosis not present

## 2019-06-27 DIAGNOSIS — R3915 Urgency of urination: Secondary | ICD-10-CM | POA: Diagnosis not present

## 2019-07-02 ENCOUNTER — Ambulatory Visit: Payer: Medicare Other | Attending: Internal Medicine

## 2019-07-02 DIAGNOSIS — Z23 Encounter for immunization: Secondary | ICD-10-CM

## 2019-07-02 NOTE — Progress Notes (Signed)
   Covid-19 Vaccination Clinic  Name:  MARIDEL PIXLER    MRN: [id number] DOB: [birth year]  07/02/2019  Ms. Benassi was observed post Covid-19 immunization for 30 minutes based on pre-vaccination screening without incident. She was provided with Vaccine Information Sheet and instruction to access the V-Safe system.   Ms. Witkop was instructed to call 911 with any severe reactions post vaccine: Marland Kitchen Difficulty breathing  . Swelling of face and throat  . A fast heartbeat  . A bad rash all over body  . Dizziness and weakness   Immunizations Administered    Name Date Dose VIS Date Route   Pfizer COVID-19 Vaccine 07/02/2019  1:49 PM 0.3 mL 04/25/2018 Intramuscular   Manufacturer: [employer], [employer]   Lot: [id number]   NDC: [address]-2

## 2019-07-04 ENCOUNTER — Other Ambulatory Visit: Payer: Medicare Other

## 2019-07-05 ENCOUNTER — Ambulatory Visit: Payer: Medicare Other

## 2019-07-09 DIAGNOSIS — M47816 Spondylosis without myelopathy or radiculopathy, lumbar region: Secondary | ICD-10-CM | POA: Diagnosis not present

## 2019-07-09 DIAGNOSIS — M461 Sacroiliitis, not elsewhere classified: Secondary | ICD-10-CM | POA: Diagnosis not present

## 2019-07-09 DIAGNOSIS — G4700 Insomnia, unspecified: Secondary | ICD-10-CM | POA: Diagnosis not present

## 2019-07-09 DIAGNOSIS — G894 Chronic pain syndrome: Secondary | ICD-10-CM | POA: Diagnosis not present

## 2019-07-18 ENCOUNTER — Other Ambulatory Visit: Payer: Self-pay | Admitting: Physical Medicine and Rehabilitation

## 2019-07-18 ENCOUNTER — Ambulatory Visit
Admission: RE | Admit: 2019-07-18 | Discharge: 2019-07-18 | Disposition: A | Discharge status: Home / Self Care | Payer: Medicare Other | Source: Ambulatory Visit | Attending: Family Medicine | Admitting: Family Medicine

## 2019-07-18 ENCOUNTER — Ambulatory Visit
Admission: RE | Admit: 2019-07-18 | Discharge: 2019-07-18 | Disposition: A | Discharge status: Home / Self Care | Payer: Medicare Other | Source: Ambulatory Visit | Attending: Physical Medicine and Rehabilitation | Admitting: Physical Medicine and Rehabilitation

## 2019-07-18 DIAGNOSIS — M25551 Pain in right hip: Secondary | ICD-10-CM

## 2019-07-18 DIAGNOSIS — M542 Cervicalgia: Secondary | ICD-10-CM | POA: Diagnosis not present

## 2019-07-18 DIAGNOSIS — M25552 Pain in left hip: Secondary | ICD-10-CM

## 2019-07-18 DIAGNOSIS — R059 Cough, unspecified: Secondary | ICD-10-CM

## 2019-07-18 DIAGNOSIS — R05 Cough: Secondary | ICD-10-CM | POA: Diagnosis not present

## 2019-07-25 DIAGNOSIS — M5416 Radiculopathy, lumbar region: Secondary | ICD-10-CM | POA: Diagnosis not present

## 2019-08-07 ENCOUNTER — Ambulatory Visit: Payer: Medicare Other

## 2019-08-08 DIAGNOSIS — K219 Gastro-esophageal reflux disease without esophagitis: Secondary | ICD-10-CM | POA: Diagnosis not present

## 2019-08-08 DIAGNOSIS — M47816 Spondylosis without myelopathy or radiculopathy, lumbar region: Secondary | ICD-10-CM | POA: Diagnosis not present

## 2019-08-08 DIAGNOSIS — G894 Chronic pain syndrome: Secondary | ICD-10-CM | POA: Diagnosis not present

## 2019-08-08 DIAGNOSIS — M461 Sacroiliitis, not elsewhere classified: Secondary | ICD-10-CM | POA: Diagnosis not present

## 2019-08-08 DIAGNOSIS — R0602 Shortness of breath: Secondary | ICD-10-CM | POA: Diagnosis not present

## 2019-08-08 DIAGNOSIS — G4700 Insomnia, unspecified: Secondary | ICD-10-CM | POA: Diagnosis not present

## 2019-08-08 DIAGNOSIS — R05 Cough: Secondary | ICD-10-CM | POA: Diagnosis not present

## 2019-08-15 MED FILL — diazePAM 5 MG TABS: 5 | 60 days supply | Qty: 60 | Fill #0

## 2019-08-23 MED FILL — MONTELUKAST SOD 10 MG TAB: 10 | 30 days supply | Qty: 30 | Fill #1

## 2019-08-24 ENCOUNTER — Ambulatory Visit: Payer: Medicare Other

## 2019-08-28 ENCOUNTER — Other Ambulatory Visit: Payer: Self-pay | Admitting: Cardiovascular Disease

## 2019-08-28 ENCOUNTER — Ambulatory Visit (INDEPENDENT_AMBULATORY_CARE_PROVIDER_SITE_OTHER): Payer: Medicare Other | Admitting: Pharmacist Clinician (PhC)/ Clinical Pharmacy Specialist

## 2019-08-28 ENCOUNTER — Other Ambulatory Visit: Payer: Self-pay

## 2019-08-28 DIAGNOSIS — I10 Essential (primary) hypertension: Secondary | ICD-10-CM

## 2019-08-28 MED ORDER — AMLODIPINE BESYLATE 5 MG PO TABS
5.0000 mg | ORAL_TABLET | Freq: Every day | ORAL | 3 refills | Status: DC
Start: 2019-08-28 — End: 2019-08-28

## 2019-08-28 MED FILL — AMLODIPINE BESYLATE 5 MG TA: 5 | 90 days supply | Qty: 90 | Fill #0

## 2019-08-28 NOTE — Progress Notes (Signed)
08/28/2019 Carly Delacruz 1965/11/15 [id number]   HPI:  Carly Delacruz is a 54 y.o. female patient of Dr Allyson Sabal, with a PMH below who presents today for hypertension clinic evaluation.  She was last seen by Dr. Allyson Sabal about 10 weeks ago and found to have a pressure of 146/90.  Unfortunately at that time she had developed a cough and had to discontinue lisinopril.  He increased her carvedilol from 6.25 to 12.5 mg twice daily.  She returns today for follow up.    Patient notes that since seeing Dr. Allyson Sabal she has seen a lung specialist and is now scheduled for PFT testing in July.  Her cough is mostly resolved and she has had no issues with the increased dose of carvedilol.  She notes that the albuterol inhaler has been beneficial.     Past Medical History: hyperlipidemia 2019 TC 208, TG 83, HDL 69.7, LDL 122 - just started atorvastatin 20 mg  ASCVD Coronary calcium score 18 - 87th percentile for age/gender  GERD Recently switched from famotidine to omeprazole  Sleep apnea No CPAP or supplemental O2 at this time  Morbid obesity Current weight at 264, down from 315 prior to gastric bypass     Blood Pressure Goal:  130/80  Current Medications: carvedilol 12.5 mg bid  Family Hx: hypertension in her sister, OSA and kidney disease in brother  Social Hx: no tobacco, no alcohol  Diet: tries to make healthy food choices, does not add salt to foods  Exercise:  Uses walker due to chronic pain issues  Home BP readings:  No readings with her today but does recall highest readings about 150/110 and lowest 130/80.  Believes most systolic readings were in the 277'O and diastolic 95-110  Intolerances: losartan - palpitations, lethargy, fatigue; lisinopril - cough  Labs: 03/2018:  Na 143, K 4.7, Glu 78, BUN 10, SCr 1.04  GFR AA 70  Wt Readings from Last 3 Encounters:  06/05/19 264 lb (119.7 kg)  12/06/18 257 lb (116.6 kg)  10/26/18 254 lb 3.2 oz (115.3 kg)   BP Readings from Last 3  Encounters:  08/28/19 (!) 144/96  06/05/19 (!) 146/90  10/26/18 130/87   Pulse Readings from Last 3 Encounters:  08/28/19 (!) 58  06/05/19 77  10/26/18 82    Current Outpatient Medications  Medication Sig Dispense Refill  . albuterol (VENTOLIN HFA) 108 (90 Base) MCG/ACT inhaler Inhale 1 puff into the lungs every 6 (six) hours as needed for wheezing or shortness of breath.    . ARIPiprazole (ABILIFY) 5 MG tablet Take 5 mg by mouth daily.    Marland Kitchen atorvastatin (LIPITOR) 20 MG tablet Take 1 tablet (20 mg total) by mouth daily. 90 tablet 3  . carvedilol (COREG) 12.5 MG tablet Take 1 tablet (12.5 mg total) by mouth 2 (two) times daily. 180 tablet 3  . diazepam (VALIUM) 5 MG tablet Place 5 mg vaginally at bedtime.     Marland Kitchen FLUoxetine (PROZAC) 40 MG capsule Take 1 capsule (40 mg total) by mouth daily. 90 capsule 0  . furosemide (LASIX) 20 MG tablet Take 1 tablet (20 mg total) by mouth daily as needed for fluid or edema. 30 tablet 3  . gabapentin (NEURONTIN) 100 MG capsule Take 100 mg by mouth 2 (two) times daily.    . hydrOXYzine (VISTARIL) 25 MG capsule Take 25 mg by mouth as needed.  2  . montelukast (SINGULAIR) 10 MG tablet Take 10 mg by mouth at bedtime.    Marland Kitchen  omeprazole (PRILOSEC) 20 MG capsule Take 20 mg by mouth daily.    . potassium chloride (K-DUR,KLOR-CON) 10 MEQ tablet Take 1 tablet (10 mEq total) by mouth daily. As directed with lasix 60 tablet 1  . tapentadol (NUCYNTA) 50 MG tablet Take 50 mg by mouth 3 (three) times daily.    Marland Kitchen tiZANidine (ZANAFLEX) 4 MG capsule Take 4 mg by mouth 3 (three) times daily as needed for muscle spasms.    . traZODone (DESYREL) 100 MG tablet Take 100 mg by mouth at bedtime. 100-200 mg at bedtime as needed    . amLODipine (NORVASC) 5 MG tablet Take 1 tablet (5 mg total) by mouth daily. 180 tablet 3   No current facility-administered medications for this visit.    Allergies  Allergen Reactions  . Morphine And Related Shortness Of Breath and Itching  .  Septra [Sulfamethoxazole-Trimethoprim] Anaphylaxis and Hives  . Sulfa Antibiotics Anaphylaxis, Hives and Swelling    Throat swelling  . Sulfamethoxazole-Trimethoprim Anaphylaxis and Hives  . Sulfasalazine Anaphylaxis, Hives, Swelling and Other (See Comments)    Throat swelling  . Ultram [Tramadol] Shortness Of Breath, Anxiety and Other (See Comments)    "panic attacks"   . Ciprofloxacin Other (See Comments)    This medication was given along with a sulfa. Reaction is unknown at this time. Must list due to possible allergy.  Leodis Liverpool [Propoxyphene N-Acetaminophen] Itching  . Adhesive [Tape] Rash  . Codeine Itching and Other (See Comments)    No change when benadryl is given  . Demerol [Meperidine] Nausea Only  . Hydromorphone Itching and Other (See Comments)    No change when benadryl is given  . Losartan Palpitations    Past Medical History:  Diagnosis Date  . Allergy   . Anemia   . Anxiety   . Arthritis 12/10/03  . Atrophic vaginitis 2009  . BV (bacterial vaginosis) 2006  . Chronic back pain   . Chronic LLQ pain   . Chronic post-traumatic stress disorder (PTSD)   . Complication of anesthesia   . Elevated LFTs   . Endometriosis   . Fatigue 2012  . Gallstones   . GERD (gastroesophageal reflux disease)   . H/O bladder infections   . H/O mumps   . H/O varicella   . History of candidal vulvovaginitis 03/21/03  . HTN (hypertension)   . Insomnia   . Insomnia secondary to situational depression 06/06/07  . Liver mass, right lobe 08/13/04   Nodule  . Malaise 2012  . Morbid obesity (HCC) 2005  . Osteoporosis   . S/P gastric bypass   . Severe episode of recurrent major depressive disorder, without psychotic features (HCC)   . Sleep apnea 2005  . SUI (stress urinary incontinence, female) 08/12/03  . Trichomonas   . Vulvar lesion 03/21/03  . Yeast infection     Blood pressure (!) 144/96, pulse (!) 58.  Essential hypertension Patient with essential hypertension (systolic  and diastolic) currently not well controlled, only on carvedilol 12.5 mg bid.  She had problems with losartan and is hesitant to try a different ARB at this time.  Previously she took amlodipine 10 mg (for several years per patient recall) and does not know why it was discontinued.  Although valsartan would probably be a better choice for lowering both systolic and diastolic readings, we will start her back on amlodipine 5 mg daily for now.  She is scheduled to see Edd Fabian NP in about 2 weeks, so we can follow  up with her after that should she need further monitoring.    Phillips Hay PharmD CPP [facility] [address] [place], [place] [address] [phone]

## 2019-08-28 NOTE — Patient Instructions (Addendum)
Return for a a follow up appointment July 6 with [facility]  Your blood pressure today is 144/96  (our goal is <130/80)  Check your blood pressure at home daily (if able) and keep record of the readings.  Take your BP meds as follows:  Start amlodipine 5 mg once daily, morning or evening  Bring all of your meds, your BP cuff and your record of home blood pressures to your next appointment.  Exercise as you're able, try to walk approximately 30 minutes per day.  Keep salt intake to a minimum, especially watch canned and prepared boxed foods.  Eat more fresh fruits and vegetables and fewer canned items.  Avoid eating in fast food restaurants.    HOW TO TAKE YOUR BLOOD PRESSURE: . Rest 5 minutes before taking your blood pressure. .  Don't smoke or drink caffeinated beverages for at least 30 minutes before. . Take your blood pressure before (not after) you eat. . Sit comfortably with your back supported and both feet on the floor (don't cross your legs). . Elevate your arm to heart level on a table or a desk. . Use the proper sized cuff. It should fit smoothly and snugly around your bare upper arm. There should be enough room to slip a fingertip under the cuff. The bottom edge of the cuff should be 1 inch above the crease of the elbow. . Ideally, take 3 measurements at one sitting and record the average.

## 2019-08-28 NOTE — Assessment & Plan Note (Signed)
Patient with essential hypertension (systolic and diastolic) currently not well controlled, only on carvedilol 12.5 mg bid.  She had problems with losartan and is hesitant to try a different ARB at this time.  Previously she took amlodipine 10 mg (for several years per patient recall) and does not know why it was discontinued.  Although valsartan would probably be a better choice for lowering both systolic and diastolic readings, we will start her back on amlodipine 5 mg daily for now.  She is scheduled to see Edd Fabian NP in about 2 weeks, so we can follow up with her after that should she need further monitoring.

## 2019-09-03 NOTE — Progress Notes (Deleted)
Cardiology Clinic Note   Patient Name: Carly Delacruz Date of Encounter: 09/03/2019  Primary Care Provider:  Irena Reichmann, DO Primary Cardiologist:  Nanetta Batty, MD  Patient Profile    Carly Delacruz presents to the clinic today for follow-up evaluation of her essential hypertension.  Past Medical History    Past Medical History:  Diagnosis Date  . Allergy   . Anemia   . Anxiety   . Arthritis 12/10/03  . Atrophic vaginitis 2009  . BV (bacterial vaginosis) 2006  . Chronic back pain   . Chronic LLQ pain   . Chronic post-traumatic stress disorder (PTSD)   . Complication of anesthesia   . Elevated LFTs   . Endometriosis   . Fatigue 2012  . Gallstones   . GERD (gastroesophageal reflux disease)   . H/O bladder infections   . H/O mumps   . H/O varicella   . History of candidal vulvovaginitis 03/21/03  . HTN (hypertension)   . Insomnia   . Insomnia secondary to situational depression 06/06/07  . Liver mass, right lobe 08/13/04   Nodule  . Malaise 2012  . Morbid obesity (HCC) 2005  . Osteoporosis   . S/P gastric bypass   . Severe episode of recurrent major depressive disorder, without psychotic features (HCC)   . Sleep apnea 2005  . SUI (stress urinary incontinence, female) 08/12/03  . Trichomonas   . Vulvar lesion 03/21/03  . Yeast infection    Past Surgical History:  Procedure Laterality Date  . ABDOMINAL HYSTERECTOMY  2001  . APPENDECTOMY  2001  . CHOLECYSTECTOMY  2011  . DILATION AND CURETTAGE OF UTERUS    . GASTRIC BYPASS  2007   [facility], Dr. Daphine Deutscher  . KNEE ARTHROSCOPY Left   . LAPAROTOMY    . TONSILLECTOMY    . WISDOM TOOTH EXTRACTION      Allergies  Allergies  Allergen Reactions  . Morphine And Related Shortness Of Breath and Itching  . Septra [Sulfamethoxazole-Trimethoprim] Anaphylaxis and Hives  . Sulfa Antibiotics Anaphylaxis, Hives and Swelling    Throat swelling  . Sulfamethoxazole-Trimethoprim Anaphylaxis and Hives    . Sulfasalazine Anaphylaxis, Hives, Swelling and Other (See Comments)    Throat swelling  . Ultram [Tramadol] Shortness Of Breath, Anxiety and Other (See Comments)    "panic attacks"   . Ciprofloxacin Other (See Comments)    This medication was given along with a sulfa. Reaction is unknown at this time. Must list due to possible allergy.  Leodis Liverpool [Propoxyphene N-Acetaminophen] Itching  . Adhesive [Tape] Rash  . Codeine Itching and Other (See Comments)    No change when benadryl is given  . Demerol [Meperidine] Nausea Only  . Hydromorphone Itching and Other (See Comments)    No change when benadryl is given  . Losartan Palpitations    History of Present Illness    Carly Delacruz is a past medical history of essential hypertension, GERD, sleep apnea, neuropathy, chronic back pain, anemia, hyperlipidemia, and morbid obesity.  She also has a history of chest pain which she was seen for in 2018 however, she never underwent evaluation.  She does not tolerate CPAP.  She underwent gastric bypass surgery in 2001.  Her previous weight was 315 pounds and she is now down to around 250 pounds.  She presented to the emergency department on 10/19 with chest pain.  Her D-dimer was mildly elevated which resulted in lower extremity Dopplers which were negative.  She also received a  CT of her chest which was negative for PE or dissection.  An echocardiogram was unremarkable and a CT coronary produced a calcium score of 18 with mild proximal LAD stenosis.  FFR showed that this was physiologically not significant.  She was last seen by Dr. Allyson Sabal on 06/05/2019.  During that time she was doing well.  She was noted to have a cough which was felt to be related to her lisinopril.  Her lisinopril was discontinued at that time.  She denied chest pain, lower extremity edema, and shortness of breath.  She followed up with cardiology Pharm.D. and was started on amlodipine 5 mg.  Her blood pressure during that visit was  144/96.  Blood Pressure Goal:  130/80  Current Medications: carvedilol 12.5 mg bid, amlodipine 5 mg daily  Family Hx: hypertension in her sister, OSA and kidney disease in brother  Social Hx: no tobacco, no alcohol  Diet: tries to make healthy food choices, does not add salt to foods  Exercise:  Uses walker due to chronic pain issues  Home BP readings:  No readings with her today but does recall highest readings about 150/110 and lowest 130/80.  Believes most systolic readings were in the 433'I and diastolic 95-110***  Intolerances: losartan - palpitations, lethargy, fatigue; lisinopril - cough  She presents to the clinic today for follow-up evaluation and states***  *** denies chest pain, shortness of breath, lower extremity edema, fatigue, palpitations, melena, hematuria, hemoptysis, diaphoresis, weakness, presyncope, syncope, orthopnea, and PND.     Home Medications    Prior to Admission medications   Medication Sig Start Date End Date Taking? Authorizing Provider  albuterol (VENTOLIN HFA) 108 (90 Base) MCG/ACT inhaler Inhale 1 puff into the lungs every 6 (six) hours as needed for wheezing or shortness of breath.    [provider]  amLODipine (NORVASC) 5 MG tablet Take 1 tablet (5 mg total) by mouth daily. 08/28/19 11/26/19  Runell Gess, MD  ARIPiprazole (ABILIFY) 5 MG tablet Take 5 mg by mouth daily.    [provider]  atorvastatin (LIPITOR) 20 MG tablet Take 1 tablet (20 mg total) by mouth daily. 06/05/19 09/03/19  Runell Gess, MD  carvedilol (COREG) 12.5 MG tablet Take 1 tablet (12.5 mg total) by mouth 2 (two) times daily. 06/05/19 09/03/19  Runell Gess, MD  diazepam (VALIUM) 5 MG tablet Place 5 mg vaginally at bedtime.     [provider]  FLUoxetine (PROZAC) 40 MG capsule Take 1 capsule (40 mg total) by mouth daily. 10/03/17   Eksir, Bo Mcclintock, MD  furosemide (LASIX) 20 MG tablet Take 1 tablet (20 mg total) by mouth daily as  needed for fluid or edema. 01/23/19   Runell Gess, MD  gabapentin (NEURONTIN) 100 MG capsule Take 100 mg by mouth 2 (two) times daily.    [provider]  hydrOXYzine (VISTARIL) 25 MG capsule Take 25 mg by mouth as needed. 11/21/17   [provider]  montelukast (SINGULAIR) 10 MG tablet Take 10 mg by mouth at bedtime.    [provider]  omeprazole (PRILOSEC) 20 MG capsule Take 20 mg by mouth daily.    [provider]  potassium chloride (K-DUR,KLOR-CON) 10 MEQ tablet Take 1 tablet (10 mEq total) by mouth daily. As directed with lasix 02/20/18   Evaristo Bury, NP  tapentadol (NUCYNTA) 50 MG tablet Take 50 mg by mouth 3 (three) times daily.    [provider]  tiZANidine (ZANAFLEX) 4  MG capsule Take 4 mg by mouth 3 (three) times daily as needed for muscle spasms.    [provider]  traZODone (DESYREL) 100 MG tablet Take 100 mg by mouth at bedtime. 100-200 mg at bedtime as needed    [provider]    Family History    Family History  Problem Relation Age of Onset  . Diabetes Paternal Grandmother   . Leukemia Maternal Grandmother   . Arthritis Mother   . Prostate cancer Father   . Colitis Sister   . Hypertension Sister   . Deep vein thrombosis Sister   . Kidney disease Brother   . Obstructive Sleep Apnea Brother    She indicated that her mother is alive. She indicated that her father is deceased. She indicated that her sister is alive. She indicated that her brother is alive. She indicated that her maternal grandmother is deceased. She indicated that her paternal grandmother is deceased.  Social History    Social History   Socioeconomic History  . Marital status: Single    Spouse name: Not on file  . Number of children: 0  . Years of education: Not on file  . Highest education level: Bachelor's degree (e.g., BA, AB, BS)  Occupational History  . Occupation: DISABILITY  Tobacco Use  . Smoking status: Never  Smoker  . Smokeless tobacco: Never Used  Vaping Use  . Vaping Use: Never used  Substance and Sexual Activity  . Alcohol use: No  . Drug use: No  . Sexual activity: Never    Birth control/protection: Surgical    Comment: hysterectomy   Other Topics Concern  . Not on file  Social History Narrative   Patient is singel. Her mother is living with her in a one level house, 2 steps to enter.    Social Determinants of Health   Financial Resource Strain:   . Difficulty of Paying Living Expenses:   Food Insecurity:   . Worried About Programme researcher, broadcasting/film/videounning Out of Food in the Last Year:   . Baristaan Out of Food in the Last Year:   Transportation Needs:   . Freight forwarderLack of Transportation (Medical):   Marland Kitchen. Lack of Transportation (Non-Medical):   Physical Activity:   . Days of Exercise per Week:   . Minutes of Exercise per Session:   Stress:   . Feeling of Stress :   Social Connections:   . Frequency of Communication with Friends and Family:   . Frequency of Social Gatherings with Friends and Family:   . Attends Religious Services:   . Active Member of Clubs or Organizations:   . Attends BankerClub or Organization Meetings:   Marland Kitchen. Marital Status:   Intimate Partner Violence:   . Fear of Current or Ex-Partner:   . Emotionally Abused:   Marland Kitchen. Physically Abused:   . Sexually Abused:      Review of Systems    General:  No chills, fever, night sweats or weight changes.  Cardiovascular:  No chest pain, dyspnea on exertion, edema, orthopnea, palpitations, paroxysmal nocturnal dyspnea. Dermatological: No rash, lesions/masses Respiratory: No cough, dyspnea Urologic: No hematuria, dysuria Abdominal:   No nausea, vomiting, diarrhea, bright red blood per rectum, melena, or hematemesis Neurologic:  No visual changes, wkns, changes in mental status. All other systems reviewed and are otherwise negative except as noted above.  Physical Exam    VS:  There were no vitals taken for this visit. , BMI There is no height or weight on file  to calculate  BMI. GEN: Well nourished, well developed, in no acute distress. HEENT: normal. Neck: Supple, no JVD, carotid bruits, or masses. Cardiac: RRR, no murmurs, rubs, or gallops. No clubbing, cyanosis, edema.  Radials/DP/PT 2+ and equal bilaterally.  Respiratory:  Respirations regular and unlabored, clear to auscultation bilaterally. GI: Soft, nontender, nondistended, BS + x 4. MS: no deformity or atrophy. Skin: warm and dry, no rash. Neuro:  Strength and sensation are intact. Psych: Normal affect.  Accessory Clinical Findings    ECG personally reviewed by me today- *** - No acute changes  EKG 06/05/2019 Normal sinus rhythm anterior infarct undetermined age 48 bpm  Echocardiogram 12/27/2016 Study Conclusions   - Left ventricle: The cavity size was normal. Wall thickness was  normal. Systolic function was normal. The estimated ejection  fraction was in the range of 60% to 65%. Wall motion was normal;  there were no regional wall motion abnormalities. Doppler  parameters are consistent with abnormal left ventricular  relaxation (grade 1 diastolic dysfunction).  - Pulmonary arteries: Systolic pressure was mildly increased. PA  peak pressure: 34 mm Hg (S).   Assessment & Plan   1.  Essential hypertension-BP today*** Continue carvedilol, amlodipine Heart healthy low-sodium diet-salty 6 given Increase physical activity as tolerated  Hyperlipidemia-LDL 122 on 08/04/2017.  Coronary calcium score 18. Continue continue atorvastatin Heart healthy low-sodium diet-salty 6 given Increase physical activity as tolerated Order lipid panel and LFTs   Disposition: Follow-up with Dr. Allyson Sabal in 3 months.  Thomasene Ripple. Liberta Gimpel NP-C    09/03/2019, 5:42 PM [facility] [address] Office [phone] Fax [phone]

## 2019-09-04 ENCOUNTER — Ambulatory Visit: Payer: Medicare Other | Admitting: General Practice

## 2019-09-06 DIAGNOSIS — M461 Sacroiliitis, not elsewhere classified: Secondary | ICD-10-CM | POA: Diagnosis not present

## 2019-09-06 DIAGNOSIS — M47816 Spondylosis without myelopathy or radiculopathy, lumbar region: Secondary | ICD-10-CM | POA: Diagnosis not present

## 2019-09-06 DIAGNOSIS — G894 Chronic pain syndrome: Secondary | ICD-10-CM | POA: Diagnosis not present

## 2019-09-06 DIAGNOSIS — I10 Essential (primary) hypertension: Secondary | ICD-10-CM | POA: Diagnosis not present

## 2019-09-06 DIAGNOSIS — Z79891 Long term (current) use of opiate analgesic: Secondary | ICD-10-CM | POA: Diagnosis not present

## 2019-09-06 DIAGNOSIS — G4700 Insomnia, unspecified: Secondary | ICD-10-CM | POA: Diagnosis not present

## 2019-09-06 DIAGNOSIS — E7800 Pure hypercholesterolemia, unspecified: Secondary | ICD-10-CM | POA: Diagnosis not present

## 2019-09-06 MED FILL — GABAPENTIN 100 MG CAPSULE: 100 | 90 days supply | Qty: 270 | Fill #0

## 2019-09-06 MED FILL — HYDROXYZINE PAMOATE 25 MG C: 25 | 90 days supply | Qty: 270 | Fill #0

## 2019-09-06 MED FILL — NUCYNTA ER 50 MG TB12: 50 | 30 days supply | Qty: 60 | Fill #0

## 2019-09-11 DIAGNOSIS — E7800 Pure hypercholesterolemia, unspecified: Secondary | ICD-10-CM | POA: Diagnosis not present

## 2019-09-11 DIAGNOSIS — I10 Essential (primary) hypertension: Secondary | ICD-10-CM | POA: Diagnosis not present

## 2019-09-11 DIAGNOSIS — Z0000 Encounter for general adult medical examination without abnormal findings: Secondary | ICD-10-CM | POA: Diagnosis not present

## 2019-09-21 MED FILL — ARIPIPRAZOLE 5 MG TABS: 5 | 90 days supply | Qty: 90 | Fill #0

## 2019-09-21 MED FILL — traZODone HCL 100 MG TABS: 100 | 90 days supply | Qty: 180 | Fill #0

## 2019-09-21 MED FILL — tiZANidine HCL 4 MG TABS: 4 | 90 days supply | Qty: 270 | Fill #0

## 2019-10-01 NOTE — Progress Notes (Deleted)
Cardiology Clinic Note   Patient Name: Carly Delacruz Date of Encounter: 10/01/2019  Primary Care Provider:  Irena Reichmann, DO Primary Cardiologist:  Nanetta Batty, MD  Patient Profile    Carly Delacruz 54 year old female presents to the clinic today for follow-up evaluation of her hypertension and hyperlipidemia.  Past Medical History    Past Medical History:  Diagnosis Date  . Allergy   . Anemia   . Anxiety   . Arthritis 12/10/03  . Atrophic vaginitis 2009  . BV (bacterial vaginosis) 2006  . Chronic back pain   . Chronic LLQ pain   . Chronic post-traumatic stress disorder (PTSD)   . Complication of anesthesia   . Elevated LFTs   . Endometriosis   . Fatigue 2012  . Gallstones   . GERD (gastroesophageal reflux disease)   . H/O bladder infections   . H/O mumps   . H/O varicella   . History of candidal vulvovaginitis 03/21/03  . HTN (hypertension)   . Insomnia   . Insomnia secondary to situational depression 06/06/07  . Liver mass, right lobe 08/13/04   Nodule  . Malaise 2012  . Morbid obesity (HCC) 2005  . Osteoporosis   . S/P gastric bypass   . Severe episode of recurrent major depressive disorder, without psychotic features (HCC)   . Sleep apnea 2005  . SUI (stress urinary incontinence, female) 08/12/03  . Trichomonas   . Vulvar lesion 03/21/03  . Yeast infection    Past Surgical History:  Procedure Laterality Date  . ABDOMINAL HYSTERECTOMY  2001  . APPENDECTOMY  2001  . CHOLECYSTECTOMY  2011  . DILATION AND CURETTAGE OF UTERUS    . GASTRIC BYPASS  2007   [facility], Dr. Daphine Deutscher  . KNEE ARTHROSCOPY Left   . LAPAROTOMY    . TONSILLECTOMY    . WISDOM TOOTH EXTRACTION      Allergies  Allergies  Allergen Reactions  . Morphine And Related Shortness Of Breath and Itching  . Septra [Sulfamethoxazole-Trimethoprim] Anaphylaxis and Hives  . Sulfa Antibiotics Anaphylaxis, Hives and Swelling    Throat swelling  .  Sulfamethoxazole-Trimethoprim Anaphylaxis and Hives  . Sulfasalazine Anaphylaxis, Hives, Swelling and Other (See Comments)    Throat swelling  . Ultram [Tramadol] Shortness Of Breath, Anxiety and Other (See Comments)    "panic attacks"   . Ciprofloxacin Other (See Comments)    This medication was given along with a sulfa. Reaction is unknown at this time. Must list due to possible allergy.  Leodis Liverpool [Propoxyphene N-Acetaminophen] Itching  . Adhesive [Tape] Rash  . Codeine Itching and Other (See Comments)    No change when benadryl is given  . Demerol [Meperidine] Nausea Only  . Hydromorphone Itching and Other (See Comments)    No change when benadryl is given  . Losartan Palpitations    History of Present Illness    Carly Delacruz has a past medical history of essential hypertension, hyperlipidemia, morbid obesity, OSA on CPAP, depression, GERD, and chest pain.  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  She was initially referred by Catarina Hartshorn NP for evaluation of her chest pain.  Work-up was recommended but never pursued.  She has no family history of heart disease.  She has no history of MI or CVA.  She underwent gastric bypass surgery in 2001 and her weight decreased from 315 pounds to 250 pounds.  She was seen in  the emergency department on 10/19 for chest pain.  Her D-dimer at that time was mildly elevated which resulted in venous Dopplers that were negative.  She underwent chest CT which was negative for PE and dissection as well.  She had a echocardiogram which was completely normal and coronary CTA that showed a calcium score of 18 with mild proximal LAD stenosis.  FFR was not significant.  She was last seen by Dr. Allyson Sabal on 06/05/2019.  During that time she indicated that a few weeks prior she had had a febrile illness and had symptoms of fever, and/V, and diarrhea.  It had resolved at the time of visit.  She had developed a cough  which was felt to be related to her lisinopril.  It was discontinued at that time.  Her carvedilol was increased from 6.25-12.5 and she was referred to [facility].D. for follow-up evaluation.  On 08/28/2019 she was seen by Penni Bombard.D. she was placed back on amlodipine 5 mg daily for better blood pressure control.  She had previously taken the medication but did not know why it been discontinued.  She presents the clinic today for follow-up evaluation and states***  *** denies chest pain, shortness of breath, lower extremity edema, fatigue, palpitations, melena, hematuria, hemoptysis, diaphoresis, weakness, presyncope, syncope, orthopnea, and PND.   Home Medications    Prior to Admission medications   Medication Sig Start Date End Date Taking? Authorizing Provider  albuterol (VENTOLIN HFA) 108 (90 Base) MCG/ACT inhaler Inhale 1 puff into the lungs every 6 (six) hours as needed for wheezing or shortness of breath.    [provider]  amLODipine (NORVASC) 5 MG tablet Take 1 tablet (5 mg total) by mouth daily. 08/28/19 11/26/19  Runell Gess, MD  ARIPiprazole (ABILIFY) 5 MG tablet Take 5 mg by mouth daily.    [provider]  atorvastatin (LIPITOR) 20 MG tablet Take 1 tablet (20 mg total) by mouth daily. 06/05/19 09/03/19  Runell Gess, MD  carvedilol (COREG) 12.5 MG tablet Take 1 tablet (12.5 mg total) by mouth 2 (two) times daily. 06/05/19 09/03/19  Runell Gess, MD  diazepam (VALIUM) 5 MG tablet Place 5 mg vaginally at bedtime.     [provider]  FLUoxetine (PROZAC) 40 MG capsule Take 1 capsule (40 mg total) by mouth daily. 10/03/17   Eksir, Bo Mcclintock, MD  furosemide (LASIX) 20 MG tablet Take 1 tablet (20 mg total) by mouth daily as needed for fluid or edema. 01/23/19   Runell Gess, MD  gabapentin (NEURONTIN) 100 MG capsule Take 100 mg by mouth 2 (two) times daily.    [provider]  hydrOXYzine (VISTARIL) 25 MG capsule Take 25  mg by mouth as needed. 11/21/17   [provider]  montelukast (SINGULAIR) 10 MG tablet Take 10 mg by mouth at bedtime.    [provider]  omeprazole (PRILOSEC) 20 MG capsule Take 20 mg by mouth daily.    [provider]  potassium chloride (K-DUR,KLOR-CON) 10 MEQ tablet Take 1 tablet (10 mEq total) by mouth daily. As directed with lasix 02/20/18   Evaristo Bury, NP  tapentadol (NUCYNTA) 50 MG tablet Take 50 mg by mouth 3 (three) times daily.    [provider]  tiZANidine (ZANAFLEX) 4 MG capsule Take 4 mg by mouth 3 (three) times daily as needed for muscle spasms.    [provider]  traZODone (DESYREL) 100 MG tablet Take 100 mg by mouth at  bedtime. 100-200 mg at bedtime as needed    [provider]    Family History    Family History  Problem Relation Age of Onset  . Diabetes Paternal Grandmother   . Leukemia Maternal Grandmother   . Arthritis Mother   . Prostate cancer Father   . Colitis Sister   . Hypertension Sister   . Deep vein thrombosis Sister   . Kidney disease Brother   . Obstructive Sleep Apnea Brother    She indicated that her mother is alive. She indicated that her father is deceased. She indicated that her sister is alive. She indicated that her brother is alive. She indicated that her maternal grandmother is deceased. She indicated that her paternal grandmother is deceased.  Social History    Social History   Socioeconomic History  . Marital status: Single    Spouse name: Not on file  . Number of children: 0  . Years of education: Not on file  . Highest education level: Bachelor's degree (e.g., BA, AB, BS)  Occupational History  . Occupation: DISABILITY  Tobacco Use  . Smoking status: Never Smoker  . Smokeless tobacco: Never Used  Vaping Use  . Vaping Use: Never used  Substance and Sexual Activity  . Alcohol use: No  . Drug use: No  . Sexual activity: Never    Birth control/protection: Surgical      Comment: hysterectomy   Other Topics Concern  . Not on file  Social History Narrative   Patient is singel. Her mother is living with her in a one level house, 2 steps to enter.    Social Determinants of Health   Financial Resource Strain:   . Difficulty of Paying Living Expenses:   Food Insecurity:   . Worried About Programme researcher, broadcasting/film/videounning Out of Food in the Last Year:   . Baristaan Out of Food in the Last Year:   Transportation Needs:   . Freight forwarderLack of Transportation (Medical):   Marland Kitchen. Lack of Transportation (Non-Medical):   Physical Activity:   . Days of Exercise per Week:   . Minutes of Exercise per Session:   Stress:   . Feeling of Stress :   Social Connections:   . Frequency of Communication with Friends and Family:   . Frequency of Social Gatherings with Friends and Family:   . Attends Religious Services:   . Active Member of Clubs or Organizations:   . Attends BankerClub or Organization Meetings:   Marland Kitchen. Marital Status:   Intimate Partner Violence:   . Fear of Current or Ex-Partner:   . Emotionally Abused:   Marland Kitchen. Physically Abused:   . Sexually Abused:      Review of Systems    General:  No chills, fever, night sweats or weight changes.  Cardiovascular:  No chest pain, dyspnea on exertion, edema, orthopnea, palpitations, paroxysmal nocturnal dyspnea. Dermatological: No rash, lesions/masses Respiratory: No cough, dyspnea Urologic: No hematuria, dysuria Abdominal:   No nausea, vomiting, diarrhea, bright red blood per rectum, melena, or hematemesis Neurologic:  No visual changes, wkns, changes in mental status. All other systems reviewed and are otherwise negative except as noted above.  Physical Exam    VS:  There were no vitals taken for this visit. , BMI There is no height or weight on file to calculate BMI. GEN: Well nourished, well developed, in no acute distress. HEENT: normal. Neck: Supple, no JVD, carotid bruits, or masses. Cardiac: RRR, no murmurs, rubs, or gallops. No clubbing, cyanosis,  edema.  Radials/DP/PT  2+ and equal bilaterally.  Respiratory:  Respirations regular and unlabored, clear to auscultation bilaterally. GI: Soft, nontender, nondistended, BS + x 4. MS: no deformity or atrophy. Skin: warm and dry, no rash. Neuro:  Strength and sensation are intact. Psych: Normal affect.  Accessory Clinical Findings    Recent Labs: No results found for requested labs within last 8760 hours.   Recent Lipid Panel    Component Value Date/Time   CHOL 208 (H) 08/04/2017 1035   TRIG 83.0 08/04/2017 1035   HDL 69.70 08/04/2017 1035   CHOLHDL 3 08/04/2017 1035   VLDL 16.6 08/04/2017 1035   LDLCALC 122 (H) 08/04/2017 1035    ECG personally reviewed by me today- *** - No acute changes  Echocardiogram 12/27/2017 Study Conclusions   - Left ventricle: The cavity size was normal. Wall thickness was  normal. Systolic function was normal. The estimated ejection  fraction was in the range of 60% to 65%. Wall motion was normal;  there were no regional wall motion abnormalities. Doppler  parameters are consistent with abnormal left ventricular  relaxation (grade 1 diastolic dysfunction).  - Pulmonary arteries: Systolic pressure was mildly increased. PA  peak pressure: 34 mm Hg (S).   Assessment & Plan   1.  Essential hypertension-BP today***.  Better controlled at home on amlodipine.  Discussed avoiding caffeine, stimulants, herbal tea, NSAIDs, and secondary causes of HTN. Continue carvedilol, amlodipine Heart healthy low-sodium diet-salty 6 given Increase physical activity as tolerated  Hyperlipidemia-122 on 08/04/2017 Continue atorvastatin Heart healthy low-sodium high-fiber diet.   Increase physical activity as tolerated Repeat lipid panel   Disposition: Follow-up with Dr. Allyson Sabal in 3 months.  Thomasene Ripple. Zamani Crocker NP-C    10/01/2019, 4:38 PM [facility] [address] Office [phone] Fax [phone]  Notice: This  dictation was prepared with Dragon dictation along with smaller phrase technology. Any transcriptional errors that result from this process are unintentional and may not be corrected upon review.

## 2019-10-02 ENCOUNTER — Ambulatory Visit: Payer: Medicare Other | Admitting: General Practice

## 2019-10-05 MED FILL — CLINDAMYCIN HCL 300 MG CAP: 300 | 10 days supply | Qty: 40 | Fill #0

## 2019-10-09 ENCOUNTER — Encounter: Payer: Self-pay | Admitting: Cardiovascular Disease

## 2019-10-09 ENCOUNTER — Other Ambulatory Visit: Payer: Self-pay

## 2019-10-09 ENCOUNTER — Ambulatory Visit: Payer: Medicare Other | Admitting: Cardiovascular Disease

## 2019-10-09 DIAGNOSIS — G4730 Sleep apnea, unspecified: Secondary | ICD-10-CM

## 2019-10-09 DIAGNOSIS — I10 Essential (primary) hypertension: Secondary | ICD-10-CM | POA: Diagnosis not present

## 2019-10-09 DIAGNOSIS — E782 Mixed hyperlipidemia: Secondary | ICD-10-CM

## 2019-10-09 MED ORDER — ATORVASTATIN CALCIUM 40 MG PO TABS
40.0000 mg | ORAL_TABLET | Freq: Every day | ORAL | 3 refills | Status: DC
Start: 2019-10-09 — End: 2019-12-05

## 2019-10-09 MED ORDER — ADULT BLOOD PRESSURE CUFF LG KIT: 1.0000 | PACK | Freq: Every day | 0 refills | Status: DC

## 2019-10-09 MED FILL — ATORVASTATIN CALCIUM 40 MG: 40 | 90 days supply | Qty: 90 | Fill #0

## 2019-10-09 NOTE — Assessment & Plan Note (Signed)
History of essential hypertension blood pressure measured today 115/81.  She is on a DASH diet.  She is on amlodipine and carvedilol.  Asked her to purchase a digital blood pressure cuff and to check her readings against ours prior to doing a 30-day blood pressure log and following up with Baxter Hire to further evaluate and make appropriate changes.

## 2019-10-09 NOTE — Assessment & Plan Note (Signed)
History of hyperlipidemia on atorvastatin 20 mg a day with LDL that came down from 1 01-0 3:18 months.  I am going to increase her atorvastatin to 40 mg a day with a goal of LDL less than 100 given her mildly elevated coronary calcium score.

## 2019-10-09 NOTE — Assessment & Plan Note (Signed)
History of obstructive sleep apnea currently not on CPAP after her gastric bypass surgery and weight loss.

## 2019-10-09 NOTE — Patient Instructions (Signed)
Medication Instructions:  INCREASE atorvastatin (Lipitor) to 40 mg daily  *If you need a refill on your cardiac medications before your next appointment, please call your pharmacy*   Lab Work: FASTING labs in 3 months (November) (lipid, hepatic)  If you have labs (blood work) drawn today and your tests are completely normal, you will receive your results only by: Marland Kitchen MyChart Message (if you have MyChart) OR . A paper copy in the mail If you have any lab test that is abnormal or we need to change your treatment, we will call you to review the results.  Follow-Up: At [facility], you and your health needs are our priority.  As part of our continuing mission to provide you with exceptional heart care, we have created designated Provider Care Teams.  These Care Teams include your primary Cardiologist (physician) and Advanced Practice Providers (APPs -  Physician Assistants and Nurse Practitioners) who all work together to provide you with the care you need, when you need it.  We recommend signing up for the patient portal called "MyChart".  Sign up information is provided on this After Visit Summary.  MyChart is used to connect with patients for Virtual Visits (Telemedicine).  Patients are able to view lab/test results, encounter notes, upcoming appointments, etc.  Non-urgent messages can be sent to your provider as well.   To learn more about what you can do with MyChart, go to [email or web address].    Your next appointment:   pharmd in 1 month (HTN)-bring your blood pressure log and blood pressure cuff  3 months  The format for your next appointment:   In Person  Provider:    Corine Shelter, PA-C  Marjie Skiff, PA-C  Edd Fabian, FNP   6 months with Dr. Allyson Sabal  Other Instructions [facility] blood pressure cuff (upper arm)-bring in to calibrate

## 2019-10-09 NOTE — Progress Notes (Signed)
10/09/2019 Carly Delacruz   18-Oct-1965  [id number]  Primary Physician Irena Reichmann, DO Primary Cardiologist: Runell Gess MD Nicholes [place], [place]  HPI:  Carly Delacruz is a 54 y.o.  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 She was seen by Rollene Rotunda 04/29/2016 for similar problems. Work-up was recommended but never pursued.I last saw her in the office 06/05/2019.Marland Kitchen There is no family history of heart disease. She is never had a heart attack or stroke. She does have a history of obstructive sleep apnea but does not wear CPAP currently because of weight loss as a result of gastric bypass surgery in 2001 when she decreased her weight from 315 down to 250. She does have a history of depression, and GERD. She has treated hypertension as well. She was seen in the emergency room on 12/04/2017 for chest pain. Her d-dimer was mildly elevated which resulted in venous Dopplers which were negative and chest CT which was negative as well for PE or dissection.  I did get a 2D echo which was completely normal and a coronary CTA that revealed a coronary calcium score of 18 with mild proximal LAD the stenosis that was ultimately shown to be not physiologically significant by FFR. She had an episode of musculoskeletal chest pain earlier this year when stretching internal light but other than that has been relatively asymptomatic. She did have a febrile illness several weeks ago characterized by fever, nausea vomiting and diarrhea as well as cough which has since resolved.  Since I saw her 4 months ago she has seen [place], one of our Pharm.D.'s, who started amlodipine.  She does check her blood pressure at home however she is not sure that her blood  pressure cuff is accurate.  She gets occasional atypical chest pain but has a coronary calcium score of 18 and a negative CTA FFR.  She has changed her diet to a "DASH diet" and has joined the aquatic center.   Current Meds  Medication Sig  . albuterol (VENTOLIN HFA) 108 (90 Base) MCG/ACT inhaler Inhale 1 puff into the lungs every 6 (six) hours as needed for wheezing or shortness of breath.  Marland Kitchen amLODipine (NORVASC) 5 MG tablet Take 1 tablet (5 mg total) by mouth daily.  . ARIPiprazole (ABILIFY) 5 MG tablet Take 5 mg by mouth daily.  Marland Kitchen atorvastatin (LIPITOR) 20 MG tablet Take 1 tablet (20 mg total) by mouth daily.  . carvedilol (COREG) 12.5 MG tablet Take 1 tablet (12.5 mg total) by mouth 2 (two) times daily.  . clindamycin (CLEOCIN) 300 MG capsule Take 300 mg by mouth QID.  Marland Kitchen diazepam (VALIUM) 5 MG tablet Place 5 mg vaginally at bedtime.   . famotidine (PEPCID) 20 MG tablet famotidine 20 mg tablet  TAKE 1 TABLET BY MOUTH ONCE DAILY AT BEDTIME  . FLUoxetine (PROZAC) 40 MG capsule Take 1 capsule (40 mg total) by mouth daily.  . furosemide (LASIX) 20 MG tablet Take 1 tablet (20 mg total) by mouth daily as needed for fluid or edema.  . gabapentin (NEURONTIN) 100 MG capsule Take 100 mg by mouth 2 (two) times daily.  . hydrOXYzine (VISTARIL) 25 MG capsule Take 25 mg by mouth as needed.  . montelukast (SINGULAIR) 10 MG tablet Take 10 mg  by mouth at bedtime.  . potassium chloride (K-DUR,KLOR-CON) 10 MEQ tablet Take 1 tablet (10 mEq total) by mouth daily. As directed with lasix  . PRILOSEC OTC 20 MG tablet Take 20 mg by mouth daily.  . tapentadol (NUCYNTA) 50 MG tablet Take 50 mg by mouth 3 (three) times daily.  Marland Kitchen tiZANidine (ZANAFLEX) 4 MG capsule Take 4 mg by mouth 3 (three) times daily as needed for muscle spasms.  . traZODone (DESYREL) 100 MG tablet Take 100 mg by mouth at bedtime. 100-200 mg at bedtime as needed     Allergies  Allergen Reactions  . Morphine And Related Shortness Of Breath and  Itching  . Septra [Sulfamethoxazole-Trimethoprim] Anaphylaxis and Hives  . Sulfa Antibiotics Anaphylaxis, Hives and Swelling    Throat swelling  . Sulfamethoxazole-Trimethoprim Anaphylaxis and Hives  . Sulfasalazine Anaphylaxis, Hives, Swelling and Other (See Comments)    Throat swelling  . Ultram [Tramadol] Shortness Of Breath, Anxiety and Other (See Comments)    "panic attacks"   . Ciprofloxacin Other (See Comments)    This medication was given along with a sulfa. Reaction is unknown at this time. Must list due to possible allergy.  Leodis Liverpool [Propoxyphene N-Acetaminophen] Itching  . Adhesive [Tape] Rash  . Codeine Itching and Other (See Comments)    No change when benadryl is given  . Demerol [Meperidine] Nausea Only  . Hydromorphone Itching and Other (See Comments)    No change when benadryl is given  . Losartan Palpitations    Social History   Socioeconomic History  . Marital status: Single    Spouse name: Not on file  . Number of children: 0  . Years of education: Not on file  . Highest education level: Bachelor's degree (e.g., BA, AB, BS)  Occupational History  . Occupation: DISABILITY  Tobacco Use  . Smoking status: Never Smoker  . Smokeless tobacco: Never Used  Vaping Use  . Vaping Use: Never used  Substance and Sexual Activity  . Alcohol use: No  . Drug use: No  . Sexual activity: Never    Birth control/protection: Surgical    Comment: hysterectomy   Other Topics Concern  . Not on file  Social History Narrative   Patient is singel. Her mother is living with her in a one level house, 2 steps to enter.    Social Determinants of Health   Financial Resource Strain:   . Difficulty of Paying Living Expenses:   Food Insecurity:   . Worried About Programme researcher, broadcasting/film/video in the Last Year:   . Barista in the Last Year:   Transportation Needs:   . Freight forwarder (Medical):   Marland Kitchen Lack of Transportation (Non-Medical):   Physical Activity:   . Days of  Exercise per Week:   . Minutes of Exercise per Session:   Stress:   . Feeling of Stress :   Social Connections:   . Frequency of Communication with Friends and Family:   . Frequency of Social Gatherings with Friends and Family:   . Attends Religious Services:   . Active Member of Clubs or Organizations:   . Attends Banker Meetings:   Marland Kitchen Marital Status:   Intimate Partner Violence:   . Fear of Current or Ex-Partner:   . Emotionally Abused:   Marland Kitchen Physically Abused:   . Sexually Abused:      Review of Systems: General: negative for chills, fever, night sweats or weight changes.  Cardiovascular: negative for  chest pain, dyspnea on exertion, edema, orthopnea, palpitations, paroxysmal nocturnal dyspnea or shortness of breath Dermatological: negative for rash Respiratory: negative for cough or wheezing Urologic: negative for hematuria Abdominal: negative for nausea, vomiting, diarrhea, bright red blood per rectum, melena, or hematemesis Neurologic: negative for visual changes, syncope, or dizziness All other systems reviewed and are otherwise negative except as noted above.    Blood pressure 115/81, pulse 77, height 5\' 3"  (1.6 m), weight 259 lb 9.6 oz (117.8 kg), SpO2 94 %.  General appearance: alert and no distress Neck: no adenopathy, no carotid bruit, no JVD, supple, symmetrical, trachea midline and thyroid not enlarged, symmetric, no tenderness/mass/nodules Lungs: clear to auscultation bilaterally Heart: regular rate and rhythm, S1, S2 normal, no murmur, click, rub or gallop Extremities: extremities normal, atraumatic, no cyanosis or edema Pulses: 2+ and symmetric Skin: Skin color, texture, turgor normal. No rashes or lesions Neurologic: Alert and oriented X 3, normal strength and tone. Normal symmetric reflexes. Normal coordination and gait  EKG not performed today  ASSESSMENT AND PLAN:   Essential hypertension History of essential hypertension blood pressure  measured today 115/81.  She is on a DASH diet.  She is on amlodipine and carvedilol.  Asked her to purchase a digital blood pressure cuff and to check her readings against ours prior to doing a 30-day blood pressure log and following up with to further evaluate and make appropriate changes.  Sleep apnea History of obstructive sleep apnea currently not on CPAP after her gastric bypass surgery and weight loss.  Hyperlipidemia History of hyperlipidemia on atorvastatin 20 mg a day with LDL that came down from 1 Baxter Hire 3:18 months.  I am going to increase her atorvastatin to 40 mg a day with a goal of LDL less than 100 given her mildly elevated coronary calcium score.      41-2 MD FACP,FACC,FAHA, [facility] 10/09/2019 10:59 AM

## 2019-10-09 NOTE — Addendum Note (Signed)
Addended by: Johney Frame A on: 10/09/2019 11:12 AM   Modules accepted: Orders

## 2019-10-10 MED FILL — NUCYNTA ER 50 MG TB12: 50 | 30 days supply | Qty: 60 | Fill #0

## 2019-10-12 MED FILL — diazePAM 5 MG TABS: 5 | 60 days supply | Qty: 60 | Fill #1

## 2019-11-01 ENCOUNTER — Other Ambulatory Visit (HOSPITAL_COMMUNITY): Payer: Self-pay | Admitting: Physical Medicine and Rehabilitation

## 2019-11-01 DIAGNOSIS — M461 Sacroiliitis, not elsewhere classified: Secondary | ICD-10-CM | POA: Diagnosis not present

## 2019-11-01 DIAGNOSIS — G894 Chronic pain syndrome: Secondary | ICD-10-CM | POA: Diagnosis not present

## 2019-11-01 DIAGNOSIS — G4700 Insomnia, unspecified: Secondary | ICD-10-CM | POA: Diagnosis not present

## 2019-11-01 DIAGNOSIS — M47816 Spondylosis without myelopathy or radiculopathy, lumbar region: Secondary | ICD-10-CM | POA: Diagnosis not present

## 2019-11-01 MED FILL — GABAPENTIN 300 MG CAPSULE: 300 | 90 days supply | Qty: 270 | Fill #0

## 2019-11-16 MED FILL — NUCYNTA ER 50 MG TB12: 50 | 30 days supply | Qty: 60 | Fill #0

## 2019-11-20 ENCOUNTER — Other Ambulatory Visit: Payer: Self-pay

## 2019-11-20 ENCOUNTER — Ambulatory Visit (INDEPENDENT_AMBULATORY_CARE_PROVIDER_SITE_OTHER): Payer: Medicare Other | Admitting: Pharmacist Clinician (PhC)/ Clinical Pharmacy Specialist

## 2019-11-20 ENCOUNTER — Other Ambulatory Visit: Payer: Self-pay | Admitting: Cardiovascular Disease

## 2019-11-20 VITALS — BP 146/100 | HR 71 | Temp 97.5°F | Resp 16 | Ht 63.0 in | Wt 248.7 lb

## 2019-11-20 DIAGNOSIS — I10 Essential (primary) hypertension: Secondary | ICD-10-CM

## 2019-11-20 MED ORDER — CHLORTHALIDONE 25 MG PO TABS
12.5000 mg | ORAL_TABLET | Freq: Every day | ORAL | 3 refills | Status: DC
Start: 2019-11-20 — End: 2019-11-20

## 2019-11-20 MED ORDER — CHLORTHALIDONE 25 MG PO TABS: 25.0000 mg | ORAL_TABLET | Freq: Every day | ORAL | 3 refills | Status: DC

## 2019-11-20 MED FILL — CHLORTHALIDONE 25 MG TABS: 25 | 90 days supply | Qty: 45 | Fill #0

## 2019-11-20 NOTE — Patient Instructions (Signed)
Return for a a follow up appointment with Edd Fabian on November 11  Go to the lab in mid October for fasting blood work  Call [employer] to see if they can determine why your BP machine is not working correctly.  Check your blood pressure at home daily and keep record of the readings.  Take your BP meds as follows:  Add chlorthalidone 12.5 mg (1/2 tablet) daily  Continue with all other medications   Bring all of your meds, your BP cuff and your record of home blood pressures to your next appointment.  Exercise as youre able, try to walk approximately 30 minutes per day.  Keep salt intake to a minimum, especially watch canned and prepared boxed foods.  Eat more fresh fruits and vegetables and fewer canned items.  Avoid eating in fast food restaurants.    HOW TO TAKE YOUR BLOOD PRESSURE:  Rest 5 minutes before taking your blood pressure.   Dont smoke or drink caffeinated beverages for at least 30 minutes before.  Take your blood pressure before (not after) you eat.  Sit comfortably with your back supported and both feet on the floor (dont cross your legs).  Elevate your arm to heart level on a table or a desk.  Use the proper sized cuff. It should fit smoothly and snugly around your bare upper arm. There should be enough room to slip a fingertip under the cuff. The bottom edge of the cuff should be 1 inch above the crease of the elbow.  Ideally, take 3 measurements at one sitting and record the average.

## 2019-11-20 NOTE — Assessment & Plan Note (Signed)
Patient with essential hypertension, unfortunately not well controlled today.  She has been unable to get readings from new BP cuff ([employer]), and was advised to contact the manufacturer about replacing the potentially defective cuff.  Will add chlorthalidone 12.5 mg once daily in the mornings and repeat labs in 2 weeks.  She is scheduled to have lipid labs drawn later in October, so we arranged for these to be drawn in 2 weeks, to save extra stick.  She is scheduled to see Edd Fabian in early November, and we can follow up with her after that as needed.

## 2019-11-20 NOTE — Progress Notes (Signed)
11/20/2019 Carly Delacruz 13-Oct-1965 [id number]   HPI:  Carly Delacruz is a 54 y.o. female patient of Dr Gwenlyn Found, with a PMH below who presents today for hypertension clinic evaluation.  Although we have seen her in the [facility] clinic, she was last seen by Dr. Gwenlyn Found about 10 weeks ago and her pressure was good at 115/81.  In the past we had to discontinue lisinopril due to a persistent cough.  Carvedilol and amlodipine were added instead, and she seems to be doing well on this regimen.    Today she returns for follow up.  She notes that for the past several days she has felt much better.  Less lethargy and fatigue.  She noted that she has stopped her nightly doses of trazodone and tizanidine and instead has switched to a product called Pure ZZZ's.  It is a combination of camomile, lavender and 2 mg melatonin.  She finds that she is sleeping just as well, but without the daytime fatigue.  Reports her pain as a 3/10 today.    Past Medical History: hyperlipidemia 2019 TC 208, TG 83, HDL 69.7, LDL 122 - just started atorvastatin 20 mg  ASCVD Coronary calcium score 18 - 87th percentile for age/gender  GERD Recently switched from famotidine to omeprazole  Sleep apnea No CPAP or supplemental O2 at this time  Morbid obesity Current weight at 248, down from 315 prior to gastric bypass     Blood Pressure Goal:  130/80  Current Medications: carvedilol 12.5 mg bid, amlodipine 5 mg qam  Family Hx: hypertension in her sister, OSA and kidney disease in brother  Social Hx: no tobacco, no alcohol; started drinking Dr. Malachi Bonds because was told the caffeine would help with her headaches.    Diet: tries to make healthy food choices, does not add salt to foods; this past week or two has been eating smaller portions as she hasn't been as hungry  Exercise:  Uses walker due to chronic pain issues; started walking this week, about 20 minutes per day  Home BP readings:  No readings with her today but does  recall highest readings about 150/110 and lowest 130/80.  Believes most systolic readings were in the 638'G and diastolic 53-646  Intolerances: losartan - palpitations, lethargy, fatigue; lisinopril - cough  Labs: 03/2018:  Na 143, K 4.7, Glu 78, BUN 10, SCr 1.04  GFR AA 70  Wt Readings from Last 3 Encounters:  11/20/19 248 lb 11.2 oz (112.8 kg)  10/09/19 259 lb 9.6 oz (117.8 kg)  06/05/19 264 lb (119.7 kg)   BP Readings from Last 3 Encounters:  11/20/19 (!) 146/100  10/09/19 115/81  08/28/19 (!) 144/96   Pulse Readings from Last 3 Encounters:  11/20/19 71  10/09/19 77  08/28/19 (!) 58    Current Outpatient Medications  Medication Sig Dispense Refill  . albuterol (VENTOLIN HFA) 108 (90 Base) MCG/ACT inhaler Inhale 1 puff into the lungs every 6 (six) hours as needed for wheezing or shortness of breath.    Marland Kitchen amLODipine (NORVASC) 5 MG tablet Take 1 tablet (5 mg total) by mouth daily. 180 tablet 3  . ARIPiprazole (ABILIFY) 5 MG tablet Take 5 mg by mouth daily.    Marland Kitchen atorvastatin (LIPITOR) 40 MG tablet Take 1 tablet (40 mg total) by mouth daily. 90 tablet 3  . Blood Pressure Monitoring (ADULT BLOOD PRESSURE CUFF LG) KIT 1 kit by Does not apply route daily. 1 kit 0  . carvedilol (COREG)  12.5 MG tablet Take 1 tablet (12.5 mg total) by mouth 2 (two) times daily. 180 tablet 3  . diazepam (VALIUM) 5 MG tablet Place 5 mg vaginally at bedtime.     Marland Kitchen FLUoxetine (PROZAC) 40 MG capsule Take 1 capsule (40 mg total) by mouth daily. 90 capsule 0  . furosemide (LASIX) 20 MG tablet Take 1 tablet (20 mg total) by mouth daily as needed for fluid or edema. 30 tablet 3  . gabapentin (NEURONTIN) 300 MG capsule Take 300 mg by mouth 3 (three) times daily.    . hydrOXYzine (VISTARIL) 25 MG capsule Take 25 mg by mouth as needed.  2  . montelukast (SINGULAIR) 10 MG tablet Take 10 mg by mouth at bedtime.    . potassium chloride (K-DUR,KLOR-CON) 10 MEQ tablet Take 1 tablet (10 mEq total) by mouth daily. As  directed with lasix 60 tablet 1  . PRILOSEC OTC 20 MG tablet Take 20 mg by mouth daily.    . tapentadol (NUCYNTA) 50 MG tablet Take 50 mg by mouth 3 (three) times daily.    Marland Kitchen tiZANidine (ZANAFLEX) 4 MG capsule Take 4 mg by mouth 3 (three) times daily as needed for muscle spasms.    . traZODone (DESYREL) 100 MG tablet Take 100 mg by mouth at bedtime. 100-200 mg at bedtime as needed    . chlorthalidone (HYGROTON) 25 MG tablet Take 0.5 tablets (12.5 mg total) by mouth daily. 45 tablet 3   No current facility-administered medications for this visit.    Allergies  Allergen Reactions  . Morphine And Related Shortness Of Breath and Itching  . Septra [Sulfamethoxazole-Trimethoprim] Anaphylaxis and Hives  . Sulfa Antibiotics Anaphylaxis, Hives and Swelling    Throat swelling  . Sulfamethoxazole-Trimethoprim Anaphylaxis and Hives  . Sulfasalazine Anaphylaxis, Hives, Swelling and Other (See Comments)    Throat swelling  . Ultram [Tramadol] Shortness Of Breath, Anxiety and Other (See Comments)    "panic attacks"   . Ciprofloxacin Other (See Comments)    This medication was given along with a sulfa. Reaction is unknown at this time. Must list due to possible allergy.  Carlton Adam [Propoxyphene N-Acetaminophen] Itching  . Adhesive [Tape] Rash  . Codeine Itching and Other (See Comments)    No change when benadryl is given  . Demerol [Meperidine] Nausea Only  . Hydromorphone Itching and Other (See Comments)    No change when benadryl is given  . Losartan Palpitations    Past Medical History:  Diagnosis Date  . Allergy   . Anemia   . Anxiety   . Arthritis 12/10/03  . Atrophic vaginitis 2009  . BV (bacterial vaginosis) 2006  . Chronic back pain   . Chronic LLQ pain   . Chronic post-traumatic stress disorder (PTSD)   . Complication of anesthesia   . Elevated LFTs   . Endometriosis   . Fatigue 2012  . Gallstones   . GERD (gastroesophageal reflux disease)   . H/O bladder infections   .  H/O mumps   . H/O varicella   . History of candidal vulvovaginitis 03/21/03  . HTN (hypertension)   . Insomnia   . Insomnia secondary to situational depression 06/06/07  . Liver mass, right lobe 08/13/04   Nodule  . Malaise 2012  . Morbid obesity ([place]) 2005  . Osteoporosis   . S/P gastric bypass   . Severe episode of recurrent major depressive disorder, without psychotic features ([place])   . Sleep apnea 2005  . SUI (stress urinary  incontinence, female) 08/12/03  . Trichomonas   . Vulvar lesion 03/21/03  . Yeast infection     Blood pressure (!) 146/100, pulse 71, temperature (!) 97.5 F (36.4 C), resp. rate 16, height $RemoveBe'5\' 3"'GZnEVifvT$  (1.6 m), weight 248 lb 11.2 oz (112.8 kg), SpO2 96 %.  Essential hypertension Patient with essential hypertension, unfortunately not well controlled today.  She has been unable to get readings from new BP cuff ([employer]), and was advised to contact the manufacturer about replacing the potentially defective cuff.  Will add chlorthalidone 12.5 mg once daily in the mornings and repeat labs in 2 weeks.  She is scheduled to have lipid labs drawn later in October, so we arranged for these to be drawn in 2 weeks, to save extra stick.  She is scheduled to see Coletta Memos in early November, and we can follow up with her after that as needed.     Tommy Medal PharmD CPP [facility] [address] [place], [place] [address] [phone]

## 2019-12-04 DIAGNOSIS — E782 Mixed hyperlipidemia: Secondary | ICD-10-CM | POA: Diagnosis not present

## 2019-12-04 LAB — HEPATIC FUNCTION PANEL
ALT: 30 IU/L (ref 0–32)
AST: 26 IU/L (ref 0–40)
Albumin: 4.7 g/dL (ref 3.8–4.9)
Alkaline Phosphatase: 136 IU/L — ABNORMAL HIGH (ref 44–121)
Bilirubin Total: 0.3 mg/dL (ref 0.0–1.2)
Bilirubin, Direct: <0.1 mg/dL (ref 0.00–0.40)
Total Protein: 7.9 g/dL (ref 6.0–8.5)

## 2019-12-04 LAB — LIPID PANEL
Chol/HDL Ratio: 2.7 ratio (ref 0.0–4.4)
Cholesterol, Total: 183 mg/dL (ref 100–199)
HDL: 68 mg/dL (ref >39)
LDL Chol Calc (NIH): 100 mg/dL — ABNORMAL HIGH (ref 0–99)
Triglycerides: 83 mg/dL (ref 0–149)
VLDL Cholesterol Cal: 15 mg/dL (ref 5–40)

## 2019-12-05 ENCOUNTER — Other Ambulatory Visit: Payer: Self-pay

## 2019-12-05 DIAGNOSIS — Z79899 Other long term (current) drug therapy: Secondary | ICD-10-CM

## 2019-12-05 DIAGNOSIS — E782 Mixed hyperlipidemia: Secondary | ICD-10-CM

## 2019-12-05 MED ORDER — ATORVASTATIN CALCIUM 80 MG PO TABS: 80.0000 mg | ORAL_TABLET | Freq: Every day | ORAL | 3 refills | Status: DC

## 2019-12-05 MED FILL — ATORVASTATIN 80 MG TABLET: 80 | 90 days supply | Qty: 90 | Fill #0

## 2019-12-10 ENCOUNTER — Other Ambulatory Visit (HOSPITAL_COMMUNITY): Payer: Self-pay | Admitting: Psychiatry

## 2019-12-14 DIAGNOSIS — H40013 Open angle with borderline findings, low risk, bilateral: Secondary | ICD-10-CM | POA: Diagnosis not present

## 2019-12-14 MED FILL — GABAPENTIN 300 MG CAPSULE: 300 | 90 days supply | Qty: 270 | Fill #0

## 2019-12-19 ENCOUNTER — Other Ambulatory Visit (HOSPITAL_COMMUNITY): Payer: Self-pay | Admitting: Physical Medicine and Rehabilitation

## 2019-12-19 DIAGNOSIS — M461 Sacroiliitis, not elsewhere classified: Secondary | ICD-10-CM | POA: Diagnosis not present

## 2019-12-19 DIAGNOSIS — G4700 Insomnia, unspecified: Secondary | ICD-10-CM | POA: Diagnosis not present

## 2019-12-19 DIAGNOSIS — G894 Chronic pain syndrome: Secondary | ICD-10-CM | POA: Diagnosis not present

## 2019-12-19 DIAGNOSIS — M47816 Spondylosis without myelopathy or radiculopathy, lumbar region: Secondary | ICD-10-CM | POA: Diagnosis not present

## 2019-12-19 MED FILL — NUCYNTA ER 50 MG TB12: 50 | 30 days supply | Qty: 60 | Fill #0

## 2019-12-19 MED FILL — HYDROXYZINE PAMOATE 25 MG C: 25 | 90 days supply | Qty: 270 | Fill #0

## 2019-12-19 MED FILL — tiZANidine HCL 4 MG TABS: 4 | 90 days supply | Qty: 270 | Fill #0

## 2019-12-31 DIAGNOSIS — H40013 Open angle with borderline findings, low risk, bilateral: Secondary | ICD-10-CM | POA: Diagnosis not present

## 2020-01-03 MED FILL — traZODone HCL 100 MG TABS: 100 | 90 days supply | Qty: 180 | Fill #0

## 2020-01-03 MED FILL — CARVEDILOL 12.5 MG TABLET: 12.5 | 90 days supply | Qty: 180 | Fill #1

## 2020-01-10 ENCOUNTER — Ambulatory Visit: Payer: Medicare Other | Admitting: General Practice

## 2020-02-06 ENCOUNTER — Other Ambulatory Visit: Payer: Self-pay

## 2020-02-06 DIAGNOSIS — M461 Sacroiliitis, not elsewhere classified: Secondary | ICD-10-CM | POA: Diagnosis not present

## 2020-02-06 DIAGNOSIS — Z79899 Other long term (current) drug therapy: Secondary | ICD-10-CM | POA: Diagnosis not present

## 2020-02-06 DIAGNOSIS — G894 Chronic pain syndrome: Secondary | ICD-10-CM | POA: Diagnosis not present

## 2020-02-06 DIAGNOSIS — M47816 Spondylosis without myelopathy or radiculopathy, lumbar region: Secondary | ICD-10-CM | POA: Diagnosis not present

## 2020-02-06 DIAGNOSIS — G4700 Insomnia, unspecified: Secondary | ICD-10-CM | POA: Diagnosis not present

## 2020-02-06 DIAGNOSIS — Z79891 Long term (current) use of opiate analgesic: Secondary | ICD-10-CM | POA: Diagnosis not present

## 2020-02-06 DIAGNOSIS — E782 Mixed hyperlipidemia: Secondary | ICD-10-CM | POA: Diagnosis not present

## 2020-02-06 MED FILL — NUCYNTA ER 50 MG TB12: 50 | 30 days supply | Qty: 60 | Fill #0

## 2020-02-06 MED FILL — GABAPENTIN 300 MG CAPSULE: 300 | 90 days supply | Qty: 360 | Fill #0

## 2020-02-07 LAB — LIPID PANEL
Chol/HDL Ratio: 3.2 ratio (ref 0.0–4.4)
Cholesterol, Total: 175 mg/dL (ref 100–199)
HDL: 55 mg/dL (ref >39)
LDL Chol Calc (NIH): 105 mg/dL — ABNORMAL HIGH (ref 0–99)
Triglycerides: 79 mg/dL (ref 0–149)
VLDL Cholesterol Cal: 15 mg/dL (ref 5–40)

## 2020-02-07 LAB — HEPATIC FUNCTION PANEL
ALT: 13 IU/L (ref 0–32)
AST: 15 IU/L (ref 0–40)
Albumin: 4 g/dL (ref 3.8–4.9)
Alkaline Phosphatase: 101 IU/L (ref 44–121)
Bilirubin Total: 0.3 mg/dL (ref 0.0–1.2)
Bilirubin, Direct: <0.1 mg/dL (ref 0.00–0.40)
Total Protein: 6.5 g/dL (ref 6.0–8.5)

## 2020-02-12 NOTE — Progress Notes (Deleted)
Cardiology Clinic Note   Patient Name: Carly Delacruz Date of Encounter: 02/12/2020  Primary Care Provider:  Janie Morning, DO Primary Cardiologist:  Quay Burow, MD  Patient Profile    Carly Delacruz. Hassinger 54 year old female presents the clinic today for follow-up evaluation of her essential hypertension, sleep apnea, and HLD.  Past Medical History    Past Medical History:  Diagnosis Date  . Allergy   . Anemia   . Anxiety   . Arthritis 12/10/03  . Atrophic vaginitis 2009  . BV (bacterial vaginosis) 2006  . Chronic back pain   . Chronic LLQ pain   . Chronic post-traumatic stress disorder (PTSD)   . Complication of anesthesia   . Elevated LFTs   . Endometriosis   . Fatigue 2012  . Gallstones   . GERD (gastroesophageal reflux disease)   . H/O bladder infections   . H/O mumps   . H/O varicella   . History of candidal vulvovaginitis 03/21/03  . HTN (hypertension)   . Insomnia   . Insomnia secondary to situational depression 06/06/07  . Liver mass, right lobe 08/13/04   Nodule  . Malaise 2012  . Morbid obesity ([place]) 2005  . Osteoporosis   . S/P gastric bypass   . Severe episode of recurrent major depressive disorder, without psychotic features ([place])   . Sleep apnea 2005  . SUI (stress urinary incontinence, female) 08/12/03  . Trichomonas   . Vulvar lesion 03/21/03  . Yeast infection    Past Surgical History:  Procedure Laterality Date  . ABDOMINAL HYSTERECTOMY  2001  . APPENDECTOMY  2001  . CHOLECYSTECTOMY  2011  . DILATION AND CURETTAGE OF UTERUS    . GASTRIC BYPASS  2007   [facility], Dr. Hassell Done  . KNEE ARTHROSCOPY Left   . LAPAROTOMY    . TONSILLECTOMY    . WISDOM TOOTH EXTRACTION      Allergies  Allergies  Allergen Reactions  . Morphine And Related Shortness Of Breath and Itching  . Septra [Sulfamethoxazole-Trimethoprim] Anaphylaxis and Hives  . Sulfa Antibiotics Anaphylaxis, Hives and Swelling    Throat swelling  .  Sulfamethoxazole-Trimethoprim Anaphylaxis and Hives  . Sulfasalazine Anaphylaxis, Hives, Swelling and Other (See Comments)    Throat swelling  . Ultram [Tramadol] Shortness Of Breath, Anxiety and Other (See Comments)    "panic attacks"   . Ciprofloxacin Other (See Comments)    This medication was given along with a sulfa. Reaction is unknown at this time. Must list due to possible allergy.  Carlton Adam [Propoxyphene N-Acetaminophen] Itching  . Adhesive [Tape] Rash  . Codeine Itching and Other (See Comments)    No change when benadryl is given  . Demerol [Meperidine] Nausea Only  . Hydromorphone Itching and Other (See Comments)    No change when benadryl is given  . Losartan Palpitations    History of Present Illness    Carly Delacruz has a past medical history of essential hypertension, hyperlipidemia, morbid obesity, OSA on CPAP, depression, GERD, and chest pain.  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  She was initially referred by Brien Few NP for evaluation of her chest pain.  Work-up was recommended but never pursued.  She has no family history of heart disease.  She has no history of MI or CVA.  She underwent gastric bypass surgery in 2001 and her weight decreased from 315 pounds to 250 pounds.  She was  seen in the emergency department on 10/19 for chest pain.  Her D-dimer at that time was mildly elevated which resulted in venous Dopplers that were negative.  She underwent chest CT which was negative for PE and dissection as well.  She had a echocardiogram which was completely normal and coronary CTA that showed a calcium score of 18 with mild proximal LAD stenosis.  FFR was not significant.  She was last seen by Dr. Gwenlyn Found on 06/05/2019.  During that time she indicated that a few weeks prior she had had a febrile illness and had symptoms of fever, and/V, and diarrhea.  It had resolved at the time of visit.  She had developed a cough  which was felt to be related to her lisinopril.  It was discontinued at that time.  Her carvedilol was increased from 6.25-12.5 and she was referred to [facility]D. for follow-up evaluation.  On 08/28/2019 she was seen by York Pellant.D. she was placed back on amlodipine 5 mg daily for better blood pressure control.  She had previously taken the medication but did not know why it been discontinued.  She presents the clinic today for follow-up evaluation and states***  *** denies chest pain, shortness of breath, lower extremity edema, fatigue, palpitations, melena, hematuria, hemoptysis, diaphoresis, weakness, presyncope, syncope, orthopnea, and PND.  Home Medications    Prior to Admission medications   Medication Sig Start Date End Date Taking? Authorizing Provider  albuterol (VENTOLIN HFA) 108 (90 Base) MCG/ACT inhaler Inhale 1 puff into the lungs every 6 (six) hours as needed for wheezing or shortness of breath.    [provider]  amLODipine (NORVASC) 5 MG tablet Take 1 tablet (5 mg total) by mouth daily. 08/28/19 11/26/19  Lorretta Harp, MD  ARIPiprazole (ABILIFY) 5 MG tablet Take 5 mg by mouth daily.    [provider]  atorvastatin (LIPITOR) 80 MG tablet Take 1 tablet (80 mg total) by mouth daily. 12/05/19 03/04/20  Lorretta Harp, MD  Blood Pressure Monitoring (ADULT BLOOD PRESSURE CUFF LG) KIT 1 kit by Does not apply route daily. 10/09/19   Lorretta Harp, MD  carvedilol (COREG) 12.5 MG tablet Take 1 tablet (12.5 mg total) by mouth 2 (two) times daily. 06/05/19 11/19/20  Lorretta Harp, MD  chlorthalidone (HYGROTON) 25 MG tablet Take 0.5 tablets (12.5 mg total) by mouth daily. 11/20/19 02/18/20  Lorretta Harp, MD  diazepam (VALIUM) 5 MG tablet Place 5 mg vaginally at bedtime.     [provider]  FLUoxetine (PROZAC) 40 MG capsule Take 1 capsule (40 mg total) by mouth daily. 10/03/17   Eksir, Richard Miu, MD  furosemide (LASIX) 20 MG tablet  Take 1 tablet (20 mg total) by mouth daily as needed for fluid or edema. 01/23/19   Lorretta Harp, MD  gabapentin (NEURONTIN) 300 MG capsule Take 300 mg by mouth 3 (three) times daily.    [provider]  hydrOXYzine (VISTARIL) 25 MG capsule Take 25 mg by mouth as needed. 11/21/17   [provider]  montelukast (SINGULAIR) 10 MG tablet Take 10 mg by mouth at bedtime.    [provider]  potassium chloride (K-DUR,KLOR-CON) 10 MEQ tablet Take 1 tablet (10 mEq total) by mouth daily. As directed with lasix 02/20/18   Lance Sell, NP  PRILOSEC OTC 20 MG tablet Take 20 mg by mouth daily. 09/06/19   [provider]  tapentadol (NUCYNTA) 50 MG tablet Take 50 mg by mouth  3 (three) times daily.    [provider]  tiZANidine (ZANAFLEX) 4 MG capsule Take 4 mg by mouth 3 (three) times daily as needed for muscle spasms.    [provider]  traZODone (DESYREL) 100 MG tablet Take 100 mg by mouth at bedtime. 100-200 mg at bedtime as needed    [provider]    Family History    Family History  Problem Relation Age of Onset  . Diabetes Paternal Grandmother   . Leukemia Maternal Grandmother   . Arthritis Mother   . Prostate cancer Father   . Colitis Sister   . Hypertension Sister   . Deep vein thrombosis Sister   . Kidney disease Brother   . Obstructive Sleep Apnea Brother    She indicated that her mother is alive. She indicated that her father is deceased. She indicated that her sister is alive. She indicated that her brother is alive. She indicated that her maternal grandmother is deceased. She indicated that her paternal grandmother is deceased.  Social History    Social History   Socioeconomic History  . Marital status: Single    Spouse name: Not on file  . Number of children: 0  . Years of education: Not on file  . Highest education level: Bachelor's degree (e.g., BA, AB, BS)  Occupational History  . Occupation:  DISABILITY  Tobacco Use  . Smoking status: Never Smoker  . Smokeless tobacco: Never Used  Vaping Use  . Vaping Use: Never used  Substance and Sexual Activity  . Alcohol use: No  . Drug use: No  . Sexual activity: Never    Birth control/protection: Surgical    Comment: hysterectomy   Other Topics Concern  . Not on file  Social History Narrative   Patient is singel. Her mother is living with her in a one level house, 2 steps to enter.    Social Determinants of Health   Financial Resource Strain: Not on file  Food Insecurity: Not on file  Transportation Needs: Not on file  Physical Activity: Not on file  Stress: Not on file  Social Connections: Not on file  Intimate Partner Violence: Not on file     Review of Systems    General:  No chills, fever, night sweats or weight changes.  Cardiovascular:  No chest pain, dyspnea on exertion, edema, orthopnea, palpitations, paroxysmal nocturnal dyspnea. Dermatological: No rash, lesions/masses Respiratory: No cough, dyspnea Urologic: No hematuria, dysuria Abdominal:   No nausea, vomiting, diarrhea, bright red blood per rectum, melena, or hematemesis Neurologic:  No visual changes, wkns, changes in mental status. All other systems reviewed and are otherwise negative except as noted above.  Physical Exam    VS:  There were no vitals taken for this visit. , BMI There is no height or weight on file to calculate BMI. GEN: Well nourished, well developed, in no acute distress. HEENT: normal. Neck: Supple, no JVD, carotid bruits, or masses. Cardiac: RRR, no murmurs, rubs, or gallops. No clubbing, cyanosis, edema.  Radials/DP/PT 2+ and equal bilaterally.  Respiratory:  Respirations regular and unlabored, clear to auscultation bilaterally. GI: Soft, nontender, nondistended, BS + x 4. MS: no deformity or atrophy. Skin: warm and dry, no rash. Neuro:  Strength and sensation are intact. Psych: Normal affect.  Accessory Clinical Findings     Recent Labs: 02/06/2020: ALT 13   Recent Lipid Panel    Component Value Date/Time   CHOL 175 02/06/2020 1204   TRIG 79 02/06/2020 1204  HDL 55 02/06/2020 1204   CHOLHDL 3.2 02/06/2020 1204   CHOLHDL 3 08/04/2017 1035   VLDL 16.6 08/04/2017 1035   LDLCALC 105 (H) 02/06/2020 1204    ECG personally reviewed by me today- *** - No acute changes  Echocardiogram 12/27/2017 Study Conclusions   - Left ventricle: The cavity size was normal. Wall thickness was    normal. Systolic function was normal. The estimated ejection    fraction was in the range of 60% to 65%. Wall motion was normal;    there were no regional wall motion abnormalities. Doppler    parameters are consistent with abnormal left ventricular    relaxation (grade 1 diastolic dysfunction).  - Pulmonary arteries: Systolic pressure was mildly increased. PA    peak pressure: 34 mm Hg (S).   Assessment & Plan   1.  Essential hypertension-BP today***.  Better controlled at home on amlodipine.  Discussed avoiding caffeine, stimulants, herbal tea, NSAIDs, and secondary causes of HTN. Continue carvedilol, amlodipine Heart healthy low-sodium diet-salty 6 given Increase physical activity as tolerated  Hyperlipidemia-105 on 02/06/20 Continue atorvastatin Heart healthy low-sodium high-fiber diet.   Increase physical activity as tolerated   Obstructive sleep apnea-not on CPAP post gastric bypass surgery and weight loss. Continue to monitor  Disposition: Follow-up with Dr. Gwenlyn Found in 6 months.   Jossie Ng. Shiasia Porro NP-C    02/12/2020, 10:16 AM [address] Office [phone] Fax [phone]  Notice: This dictation was prepared with Dragon dictation along with smaller phrase technology. Any transcriptional errors that result from this process are unintentional and may not be corrected upon review.

## 2020-02-13 ENCOUNTER — Ambulatory Visit: Payer: Medicare Other | Admitting: General Practice

## 2020-02-20 MED FILL — AMLODIPINE BESYLATE 5 MG TA: 5 | 90 days supply | Qty: 90 | Fill #1

## 2020-02-20 MED FILL — NUCYNTA ER 50 MG TB12: 50 | 30 days supply | Qty: 60 | Fill #0

## 2020-02-21 DIAGNOSIS — M7061 Trochanteric bursitis, right hip: Secondary | ICD-10-CM | POA: Diagnosis not present

## 2020-02-21 DIAGNOSIS — M7062 Trochanteric bursitis, left hip: Secondary | ICD-10-CM | POA: Diagnosis not present

## 2020-02-28 ENCOUNTER — Other Ambulatory Visit: Payer: Self-pay

## 2020-02-28 DIAGNOSIS — E782 Mixed hyperlipidemia: Secondary | ICD-10-CM

## 2020-03-05 ENCOUNTER — Other Ambulatory Visit (HOSPITAL_COMMUNITY): Payer: Self-pay | Admitting: Physical Medicine and Rehabilitation

## 2020-03-05 DIAGNOSIS — G4700 Insomnia, unspecified: Secondary | ICD-10-CM | POA: Diagnosis not present

## 2020-03-05 DIAGNOSIS — G894 Chronic pain syndrome: Secondary | ICD-10-CM | POA: Diagnosis not present

## 2020-03-05 DIAGNOSIS — M461 Sacroiliitis, not elsewhere classified: Secondary | ICD-10-CM | POA: Diagnosis not present

## 2020-03-05 DIAGNOSIS — M47816 Spondylosis without myelopathy or radiculopathy, lumbar region: Secondary | ICD-10-CM | POA: Diagnosis not present

## 2020-03-18 MED FILL — ATORVASTATIN 80 MG TABLET: 80 | 90 days supply | Qty: 90 | Fill #0

## 2020-03-18 MED FILL — tiZANidine HCL 4 MG TABS: 4 | 90 days supply | Qty: 270 | Fill #0

## 2020-03-18 MED FILL — NUCYNTA ER 50 MG TB12: 50 | 30 days supply | Qty: 60 | Fill #0

## 2020-03-18 MED FILL — GABAPENTIN 300 MG CAPSULE: 300 | 90 days supply | Qty: 360 | Fill #0

## 2020-04-03 MED FILL — ARIPIPRAZOLE 5 MG TABS: 5 | 90 days supply | Qty: 90 | Fill #0

## 2020-04-03 MED FILL — FLUoxetine HCL 40 MG CAPS: 40 | 90 days supply | Qty: 90 | Fill #0

## 2020-04-03 MED FILL — HYDROXYZINE PAMOATE 25 MG C: 25 | 90 days supply | Qty: 270 | Fill #0

## 2020-04-10 MED FILL — QUETIAPINE FUMARATE 25 MG T: 25 | 30 days supply | Qty: 30 | Fill #0

## 2020-04-11 ENCOUNTER — Ambulatory Visit: Payer: Medicare Other | Admitting: Cardiovascular Disease

## 2020-04-18 ENCOUNTER — Other Ambulatory Visit (HOSPITAL_COMMUNITY): Payer: Self-pay | Admitting: Physical Medicine and Rehabilitation

## 2020-04-18 DIAGNOSIS — G4700 Insomnia, unspecified: Secondary | ICD-10-CM | POA: Diagnosis not present

## 2020-04-18 DIAGNOSIS — G894 Chronic pain syndrome: Secondary | ICD-10-CM | POA: Diagnosis not present

## 2020-04-18 DIAGNOSIS — M47816 Spondylosis without myelopathy or radiculopathy, lumbar region: Secondary | ICD-10-CM | POA: Diagnosis not present

## 2020-04-18 DIAGNOSIS — M461 Sacroiliitis, not elsewhere classified: Secondary | ICD-10-CM | POA: Diagnosis not present

## 2020-04-18 MED FILL — NUCYNTA ER 100 MG TB12: 100 | 30 days supply | Qty: 60 | Fill #0

## 2020-04-18 MED FILL — CHLORTHALIDONE 25 MG TABS: 25 | 90 days supply | Qty: 45 | Fill #1

## 2020-04-23 ENCOUNTER — Other Ambulatory Visit (HOSPITAL_COMMUNITY): Payer: Self-pay | Admitting: Obstetrics and Gynecology

## 2020-04-23 DIAGNOSIS — R195 Other fecal abnormalities: Secondary | ICD-10-CM | POA: Diagnosis not present

## 2020-04-23 DIAGNOSIS — E559 Vitamin D deficiency, unspecified: Secondary | ICD-10-CM | POA: Diagnosis not present

## 2020-04-23 DIAGNOSIS — Z1231 Encounter for screening mammogram for malignant neoplasm of breast: Secondary | ICD-10-CM | POA: Diagnosis not present

## 2020-04-23 DIAGNOSIS — Z1211 Encounter for screening for malignant neoplasm of colon: Secondary | ICD-10-CM | POA: Diagnosis not present

## 2020-04-23 DIAGNOSIS — M629 Disorder of muscle, unspecified: Secondary | ICD-10-CM | POA: Diagnosis not present

## 2020-04-23 DIAGNOSIS — M810 Age-related osteoporosis without current pathological fracture: Secondary | ICD-10-CM | POA: Diagnosis not present

## 2020-04-24 ENCOUNTER — Other Ambulatory Visit (HOSPITAL_COMMUNITY): Payer: Self-pay | Admitting: Psychiatry

## 2020-04-24 MED FILL — QUETIAPINE FUMARATE 50 MG T: 50 | 90 days supply | Qty: 90 | Fill #0

## 2020-04-25 ENCOUNTER — Other Ambulatory Visit (HOSPITAL_COMMUNITY): Payer: Self-pay | Admitting: Obstetrics and Gynecology

## 2020-04-25 ENCOUNTER — Encounter: Payer: Self-pay | Admitting: *Deleted

## 2020-04-25 MED FILL — VITAMIN D3 50,000 UNITS CAP: 1.25 MG | 56 days supply | Qty: 8 | Fill #0

## 2020-04-28 ENCOUNTER — Ambulatory Visit: Payer: Medicare Other | Admitting: Internal Medicine

## 2020-04-28 ENCOUNTER — Encounter: Payer: Self-pay | Admitting: Internal Medicine

## 2020-04-28 ENCOUNTER — Other Ambulatory Visit: Payer: Self-pay | Admitting: Internal Medicine

## 2020-04-28 VITALS — BP 132/80 | HR 93 | Ht 63.0 in | Wt 272.8 lb

## 2020-04-28 DIAGNOSIS — R1310 Dysphagia, unspecified: Secondary | ICD-10-CM

## 2020-04-28 DIAGNOSIS — K219 Gastro-esophageal reflux disease without esophagitis: Secondary | ICD-10-CM

## 2020-04-28 DIAGNOSIS — R109 Unspecified abdominal pain: Secondary | ICD-10-CM

## 2020-04-28 DIAGNOSIS — K625 Hemorrhage of anus and rectum: Secondary | ICD-10-CM | POA: Diagnosis not present

## 2020-04-28 MED ORDER — SUPREP BOWEL PREP KIT 17.5-3.13-1.6 GM/177ML PO SOLN
1.0000 | ORAL | 0 refills | Status: DC
Start: 2020-04-28 — End: 2020-04-28

## 2020-04-28 MED ORDER — DICYCLOMINE HCL 20 MG PO TABS: 20.0000 mg | ORAL_TABLET | Freq: Two times a day (BID) | ORAL | 1 refills | Status: DC | PRN

## 2020-04-28 MED FILL — DICYCLOMINE 20 MG TABLET: 20 | 30 days supply | Qty: 60 | Fill #0

## 2020-04-28 MED FILL — SUPREP BOWEL PREP KIT: 17.5-3.13-1 | 1 days supply | Qty: 354 | Fill #0

## 2020-04-28 NOTE — Patient Instructions (Signed)
We have sent the following medications to your pharmacy for you to pick up at your convenience: Bentyl , Suprep   You have been scheduled for an endoscopy and colonoscopy. Please follow the written instructions given to you at your visit today. Please pick up your prep supplies at the pharmacy within the next 1-3 days. If you use inhalers (even only as needed), please bring them with you on the day of your procedure.   Due to recent changes in healthcare laws, you may see the results of your imaging and laboratory studies on MyChart before your provider has had a chance to review them.  We understand that in some cases there may be results that are confusing or concerning to you. Not all laboratory results come back in the same time frame and the provider may be waiting for multiple results in order to interpret others.  Please give [place] 48 hours in order for your provider to thoroughly review all the results before contacting the office for clarification of your results.    If you are age 57 or younger, your body mass index should be between 19-25. Your Body mass index is 48.32 kg/m. If this is out of the aformentioned range listed, please consider follow up with your Primary Care Provider.   Thank you for choosing me and [facility].  Dr. Vonna Kotyk Pyrtle

## 2020-04-28 NOTE — Progress Notes (Signed)
Subjective:    Patient ID: Carly Delacruz, female    DOB: December 03, 1965, 55 y.o.   MRN: [id number]  HPI Carly Delacruz is a 55 year old female with a history of GERD, obesity with prior gastric bypass, history of pelvic floor dysfunction/history of levator ani syndrome, sleep apnea, anxiety with history of depression, history of chronic pain who seen to evaluate recent red blood with stool.  She is known to me from her screening colonoscopy performed in 2015 and I saw her in September 2019 to evaluate intermittent postprandial loose stools.  She is here alone today.  She reports that over the last couple months she has seen painless bright red blood on her stool.  This is intermittent but has been present multiple times in the last couple months.  It is painless in nature.  She has not seen blood with wiping.  There is been no black or melenic stools.  She has left-sided abdominal crampy pain that radiates across her lower abdomen.  This is more longstanding in nature and again comes and goes.  She questions if this could be related to her pelvic floor dysfunction.  Her loose stools have been very occasional.  Stools are not hard and she denies constipation.  She has not lost weight in fact she has gained some weight.  There is been no nausea or vomiting.  She does not have a great appetite.  She has noticed that [place] foods like bread can at times feel like they get stuck at the sternal notch.  Her heartburn is controlled as long as she takes her omeprazole 20 mg daily.   Review of Systems As per HPI, otherwise negative  Current Medications, Allergies, Past Medical History, Past Surgical History, Family History and Social History were reviewed in [employer] record.     Objective:   Physical Exam BP 132/80   Pulse 93   Ht 5\' 3"  (1.6 m)   Wt 272 lb 12.8 oz (123.7 kg)   SpO2 95%   BMI 48.32 kg/m  Gen: awake, alert, NAD HEENT: anicteric  CV: RRR, no mrg Pulm: CTA  b/l Abd: soft, NT/ND, +BS throughout Ext: no c/c/e Neuro: nonfocal  CBC    Component Value Date/Time   WBC 5.3 03/20/2018 1428   WBC 5.6 12/04/2017 1029   RBC 4.73 03/20/2018 1428   RBC 4.51 12/04/2017 1029   HGB 12.3 03/20/2018 1428   HCT 38.7 03/20/2018 1428   PLT 363 03/20/2018 1428   MCV 82 03/20/2018 1428   MCH 26.0 (L) 03/20/2018 1428   MCH 27.9 12/04/2017 1029   MCHC 31.8 03/20/2018 1428   MCHC 31.7 12/04/2017 1029   RDW 13.6 03/20/2018 1428   LYMPHSABS 1.4 10/03/2017 1456   MONOABS 0.6 10/03/2017 1456   EOSABS 0.0 10/03/2017 1456   BASOSABS 0.1 10/03/2017 1456       Assessment & Plan:  55 year old female with a history of GERD, obesity with prior gastric bypass, history of pelvic floor dysfunction/history of levator ani syndrome, sleep apnea, anxiety with history of depression, history of chronic pain who seen to evaluate recent red blood with stool.   1.  Painless rectal bleeding --colonoscopy recommended to further evaluate.  This could be secondary to internal hemorrhoids however other more distal colonic pathology needs to be excluded.  Her last colonoscopy was nearly 7 years ago.  We discussed the risk Trulance alternatives and she is agreeable wishes to proceed --Colonoscopy in the [facility]  2.  Intermittent  left-sided crampy abdominal pain --more longstanding than the bleeding.  She tried Bentyl years ago which seemed to significantly help.  We will retry this summer also performing colonoscopy to rule out other colonic pathology. --Bentyl 20 mg 3 times daily as needed  3.  GERD with intermittent dysphagia --intermittent dysphagia type symptoms to bread and pills.  GERD seems to be well controlled with omeprazole 20 mg daily.  Upper endoscopy recommended with possible dilation --Continue omeprazole 20 mg daily --EGD in the [facility] with possible dilation

## 2020-05-01 ENCOUNTER — Other Ambulatory Visit (HOSPITAL_COMMUNITY): Payer: Self-pay | Admitting: Physical Medicine and Rehabilitation

## 2020-05-01 DIAGNOSIS — M461 Sacroiliitis, not elsewhere classified: Secondary | ICD-10-CM | POA: Diagnosis not present

## 2020-05-01 DIAGNOSIS — G894 Chronic pain syndrome: Secondary | ICD-10-CM | POA: Diagnosis not present

## 2020-05-01 DIAGNOSIS — M47816 Spondylosis without myelopathy or radiculopathy, lumbar region: Secondary | ICD-10-CM | POA: Diagnosis not present

## 2020-05-01 DIAGNOSIS — Z79891 Long term (current) use of opiate analgesic: Secondary | ICD-10-CM | POA: Diagnosis not present

## 2020-05-01 DIAGNOSIS — G4700 Insomnia, unspecified: Secondary | ICD-10-CM | POA: Diagnosis not present

## 2020-05-01 MED FILL — ZOLEDRONIC ACID 5 MG/100 ML: 5 | 1 days supply | Qty: 100 | Fill #0

## 2020-05-07 ENCOUNTER — Encounter (HOSPITAL_COMMUNITY): Payer: Medicare Other

## 2020-05-08 ENCOUNTER — Encounter (HOSPITAL_COMMUNITY): Payer: Medicare Other

## 2020-05-14 ENCOUNTER — Inpatient Hospital Stay (HOSPITAL_COMMUNITY): Admission: RE | Admit: 2020-05-14 | Payer: Medicare Other | Source: Ambulatory Visit

## 2020-05-19 MED FILL — NUCYNTA ER 100 MG TB12: 100 | 30 days supply | Qty: 60 | Fill #0

## 2020-05-27 ENCOUNTER — Ambulatory Visit: Payer: Medicare Other | Admitting: Cardiovascular Disease

## 2020-05-28 ENCOUNTER — Ambulatory Visit: Payer: Medicare Other | Admitting: Cardiovascular Disease

## 2020-06-13 ENCOUNTER — Other Ambulatory Visit (HOSPITAL_COMMUNITY): Payer: Self-pay

## 2020-06-13 MED FILL — Dicyclomine HCl Tab 20 MG: ORAL | 30 days supply | Qty: 60 | Fill #0 | Status: AC

## 2020-06-13 MED FILL — Gabapentin Cap 300 MG: ORAL | 90 days supply | Qty: 360 | Fill #0 | Status: AC

## 2020-06-13 MED FILL — Tizanidine HCl Tab 4 MG (Base Equivalent): ORAL | 90 days supply | Qty: 270 | Fill #0 | Status: AC

## 2020-06-18 ENCOUNTER — Other Ambulatory Visit (HOSPITAL_COMMUNITY): Payer: Self-pay

## 2020-06-18 ENCOUNTER — Encounter: Payer: Medicare Other | Admitting: Internal Medicine

## 2020-06-19 ENCOUNTER — Other Ambulatory Visit (HOSPITAL_COMMUNITY): Payer: Self-pay

## 2020-06-19 ENCOUNTER — Other Ambulatory Visit: Payer: Self-pay | Admitting: Cardiovascular Disease

## 2020-06-19 MED ORDER — QUETIAPINE FUMARATE 100 MG PO TABS
ORAL_TABLET | ORAL | 0 refills | Status: DC
  Filled 2020-06-19: qty 90, 90d supply, fill #0

## 2020-06-19 MED ORDER — CARVEDILOL 12.5 MG PO TABS
ORAL_TABLET | Freq: Two times a day (BID) | ORAL | 3 refills | Status: DC
  Filled 2020-06-19: qty 180, 90d supply, fill #0
  Filled 2020-10-29: qty 180, 90d supply, fill #1

## 2020-06-19 MED ORDER — FLUOXETINE HCL 20 MG PO CAPS
ORAL_CAPSULE | ORAL | 0 refills | Status: DC
  Filled 2020-06-19: qty 30, 30d supply, fill #0

## 2020-06-20 ENCOUNTER — Other Ambulatory Visit (HOSPITAL_COMMUNITY): Payer: Self-pay

## 2020-06-23 ENCOUNTER — Other Ambulatory Visit (HOSPITAL_COMMUNITY): Payer: Self-pay

## 2020-06-26 ENCOUNTER — Other Ambulatory Visit (HOSPITAL_COMMUNITY): Payer: Self-pay

## 2020-06-26 DIAGNOSIS — G894 Chronic pain syndrome: Secondary | ICD-10-CM | POA: Diagnosis not present

## 2020-06-26 DIAGNOSIS — M47816 Spondylosis without myelopathy or radiculopathy, lumbar region: Secondary | ICD-10-CM | POA: Diagnosis not present

## 2020-06-26 DIAGNOSIS — G4700 Insomnia, unspecified: Secondary | ICD-10-CM | POA: Diagnosis not present

## 2020-06-26 DIAGNOSIS — M461 Sacroiliitis, not elsewhere classified: Secondary | ICD-10-CM | POA: Diagnosis not present

## 2020-06-26 MED ORDER — NUCYNTA ER 100 MG PO TB12
100.0000 mg | ORAL_TABLET | Freq: Two times a day (BID) | ORAL | 0 refills | Status: DC
  Filled 2020-07-26: qty 60, 30d supply, fill #0

## 2020-06-26 MED ORDER — HYDROXYZINE PAMOATE 25 MG PO CAPS
25.0000 mg | ORAL_CAPSULE | Freq: Three times a day (TID) | ORAL | 0 refills | Status: DC
  Filled 2020-06-26: qty 270, 90d supply, fill #0

## 2020-06-26 MED ORDER — TIZANIDINE HCL 4 MG PO TABS
4.0000 mg | ORAL_TABLET | Freq: Every day | ORAL | 0 refills | Status: DC | PRN
  Filled 2021-02-26: qty 90, 30d supply, fill #0

## 2020-06-26 MED ORDER — NUCYNTA ER 100 MG PO TB12
100.0000 mg | ORAL_TABLET | Freq: Two times a day (BID) | ORAL | 0 refills | Status: DC
  Filled 2020-06-26: qty 60, 30d supply, fill #0

## 2020-06-26 MED ORDER — GABAPENTIN 300 MG PO CAPS
300.0000 mg | ORAL_CAPSULE | Freq: Four times a day (QID) | ORAL | 0 refills | Status: DC
  Filled 2021-02-26: qty 360, 90d supply, fill #0

## 2020-06-28 ENCOUNTER — Other Ambulatory Visit (HOSPITAL_COMMUNITY): Payer: Self-pay

## 2020-07-11 ENCOUNTER — Ambulatory Visit: Payer: Medicare Other | Admitting: Cardiovascular Disease

## 2020-07-14 ENCOUNTER — Other Ambulatory Visit (HOSPITAL_COMMUNITY): Payer: Self-pay

## 2020-07-14 MED ORDER — FLUOXETINE HCL 20 MG PO CAPS
ORAL_CAPSULE | ORAL | 0 refills | Status: DC
  Filled 2020-07-14 – 2020-07-26 (×2): qty 90, 90d supply, fill #0

## 2020-07-15 ENCOUNTER — Other Ambulatory Visit (HOSPITAL_COMMUNITY): Payer: Self-pay

## 2020-07-16 ENCOUNTER — Other Ambulatory Visit (HOSPITAL_COMMUNITY): Payer: Self-pay

## 2020-07-16 DIAGNOSIS — M810 Age-related osteoporosis without current pathological fracture: Secondary | ICD-10-CM | POA: Diagnosis not present

## 2020-07-22 ENCOUNTER — Other Ambulatory Visit (HOSPITAL_COMMUNITY): Payer: Self-pay

## 2020-07-25 ENCOUNTER — Other Ambulatory Visit (HOSPITAL_COMMUNITY): Payer: Self-pay

## 2020-07-25 MED FILL — Atorvastatin Calcium Tab 80 MG (Base Equivalent): ORAL | 90 days supply | Qty: 90 | Fill #0 | Status: AC

## 2020-07-26 ENCOUNTER — Other Ambulatory Visit (HOSPITAL_COMMUNITY): Payer: Self-pay

## 2020-07-26 MED FILL — Amlodipine Besylate Tab 5 MG (Base Equivalent): ORAL | 90 days supply | Qty: 90 | Fill #0 | Status: AC

## 2020-07-29 ENCOUNTER — Other Ambulatory Visit (HOSPITAL_COMMUNITY): Payer: Self-pay

## 2020-07-29 DIAGNOSIS — M8580 Other specified disorders of bone density and structure, unspecified site: Secondary | ICD-10-CM | POA: Diagnosis not present

## 2020-07-31 ENCOUNTER — Other Ambulatory Visit (HOSPITAL_COMMUNITY): Payer: Self-pay

## 2020-07-31 MED ORDER — VITAMIN D3 1.25 MG (50000 UT) PO CAPS
1.0000 | ORAL_CAPSULE | ORAL | 0 refills | Status: DC
  Filled 2020-07-31: qty 8, 56d supply, fill #0

## 2020-08-21 ENCOUNTER — Other Ambulatory Visit (HOSPITAL_COMMUNITY): Payer: Self-pay

## 2020-08-21 MED ORDER — QUETIAPINE FUMARATE 100 MG PO TABS
ORAL_TABLET | ORAL | 0 refills | Status: DC
  Filled 2020-08-21 – 2020-11-08 (×3): qty 90, 90d supply, fill #0

## 2020-08-21 MED ORDER — QUETIAPINE FUMARATE 100 MG PO TABS
ORAL_TABLET | ORAL | 0 refills | Status: DC
  Filled 2020-08-21 – 2020-09-02 (×2): qty 135, 90d supply, fill #0

## 2020-08-21 MED ORDER — FLUOXETINE HCL 40 MG PO CAPS
ORAL_CAPSULE | ORAL | 0 refills | Status: DC
  Filled 2020-08-21 – 2020-09-02 (×2): qty 90, 90d supply, fill #0

## 2020-08-28 ENCOUNTER — Other Ambulatory Visit (HOSPITAL_COMMUNITY): Payer: Self-pay

## 2020-08-29 ENCOUNTER — Other Ambulatory Visit (HOSPITAL_COMMUNITY): Payer: Self-pay

## 2020-08-29 DIAGNOSIS — M8580 Other specified disorders of bone density and structure, unspecified site: Secondary | ICD-10-CM | POA: Diagnosis not present

## 2020-09-02 ENCOUNTER — Other Ambulatory Visit (HOSPITAL_COMMUNITY): Payer: Self-pay

## 2020-09-02 DIAGNOSIS — G894 Chronic pain syndrome: Secondary | ICD-10-CM | POA: Diagnosis not present

## 2020-09-02 DIAGNOSIS — M461 Sacroiliitis, not elsewhere classified: Secondary | ICD-10-CM | POA: Diagnosis not present

## 2020-09-02 DIAGNOSIS — R7309 Other abnormal glucose: Secondary | ICD-10-CM | POA: Diagnosis not present

## 2020-09-02 DIAGNOSIS — G4700 Insomnia, unspecified: Secondary | ICD-10-CM | POA: Diagnosis not present

## 2020-09-02 DIAGNOSIS — E559 Vitamin D deficiency, unspecified: Secondary | ICD-10-CM | POA: Diagnosis not present

## 2020-09-02 DIAGNOSIS — M47816 Spondylosis without myelopathy or radiculopathy, lumbar region: Secondary | ICD-10-CM | POA: Diagnosis not present

## 2020-09-02 DIAGNOSIS — I10 Essential (primary) hypertension: Secondary | ICD-10-CM | POA: Diagnosis not present

## 2020-09-02 DIAGNOSIS — Z0000 Encounter for general adult medical examination without abnormal findings: Secondary | ICD-10-CM | POA: Diagnosis not present

## 2020-09-02 MED ORDER — GABAPENTIN 300 MG PO CAPS
300.0000 mg | ORAL_CAPSULE | Freq: Four times a day (QID) | ORAL | 0 refills | Status: DC
  Filled 2020-09-02: qty 360, 90d supply, fill #0

## 2020-09-02 MED ORDER — NUCYNTA ER 100 MG PO TB12
100.0000 mg | ORAL_TABLET | Freq: Two times a day (BID) | ORAL | 0 refills | Status: DC
  Filled 2020-09-02: qty 4, 2d supply, fill #0

## 2020-09-02 MED ORDER — HYDROXYZINE PAMOATE 25 MG PO CAPS
25.0000 mg | ORAL_CAPSULE | Freq: Three times a day (TID) | ORAL | 0 refills | Status: DC
  Filled 2020-09-02: qty 270, 90d supply, fill #0

## 2020-09-02 MED ORDER — TIZANIDINE HCL 4 MG PO TABS
4.0000 mg | ORAL_TABLET | Freq: Three times a day (TID) | ORAL | 0 refills | Status: DC | PRN
  Filled 2020-09-02: qty 270, 90d supply, fill #0

## 2020-09-02 MED FILL — Chlorthalidone Tab 25 MG: ORAL | 90 days supply | Qty: 45 | Fill #0 | Status: AC

## 2020-09-03 ENCOUNTER — Other Ambulatory Visit (HOSPITAL_COMMUNITY): Payer: Self-pay

## 2020-09-08 ENCOUNTER — Other Ambulatory Visit (HOSPITAL_COMMUNITY): Payer: Self-pay

## 2020-09-09 ENCOUNTER — Other Ambulatory Visit (HOSPITAL_COMMUNITY): Payer: Self-pay

## 2020-09-11 ENCOUNTER — Other Ambulatory Visit (HOSPITAL_COMMUNITY): Payer: Self-pay

## 2020-09-22 ENCOUNTER — Other Ambulatory Visit (HOSPITAL_COMMUNITY): Payer: Self-pay

## 2020-09-22 DIAGNOSIS — G894 Chronic pain syndrome: Secondary | ICD-10-CM | POA: Diagnosis not present

## 2020-09-22 DIAGNOSIS — G4700 Insomnia, unspecified: Secondary | ICD-10-CM | POA: Diagnosis not present

## 2020-09-22 DIAGNOSIS — M47816 Spondylosis without myelopathy or radiculopathy, lumbar region: Secondary | ICD-10-CM | POA: Diagnosis not present

## 2020-09-22 DIAGNOSIS — M461 Sacroiliitis, not elsewhere classified: Secondary | ICD-10-CM | POA: Diagnosis not present

## 2020-09-22 MED ORDER — BUPRENORPHINE 5 MCG/HR TD PTWK
MEDICATED_PATCH | TRANSDERMAL | 0 refills | Status: DC
  Filled 2020-09-22: qty 4, 28d supply, fill #0

## 2020-09-23 ENCOUNTER — Other Ambulatory Visit (HOSPITAL_COMMUNITY): Payer: Self-pay | Admitting: *Deleted

## 2020-09-23 ENCOUNTER — Other Ambulatory Visit (HOSPITAL_COMMUNITY): Payer: Self-pay

## 2020-09-24 DIAGNOSIS — G8929 Other chronic pain: Secondary | ICD-10-CM | POA: Diagnosis not present

## 2020-09-24 DIAGNOSIS — R3915 Urgency of urination: Secondary | ICD-10-CM | POA: Diagnosis not present

## 2020-09-25 ENCOUNTER — Other Ambulatory Visit: Payer: Self-pay

## 2020-09-25 ENCOUNTER — Ambulatory Visit (HOSPITAL_COMMUNITY)
Admission: RE | Admit: 2020-09-25 | Discharge: 2020-09-25 | Disposition: A | Discharge status: Home / Self Care | Payer: Medicare Other | Source: Ambulatory Visit | Attending: Obstetrics and Gynecology | Admitting: Obstetrics and Gynecology

## 2020-09-25 DIAGNOSIS — M810 Age-related osteoporosis without current pathological fracture: Secondary | ICD-10-CM | POA: Diagnosis not present

## 2020-09-25 MED ORDER — ZOLEDRONIC ACID 5 MG/100ML IV SOLN
5.0000 mg | Freq: Once | INTRAVENOUS | Status: AC
  Administered 2020-09-25: 5 mg via INTRAVENOUS
  Filled 2020-09-25: qty 100

## 2020-09-25 MED ORDER — ZOLEDRONIC ACID 5 MG/100ML IV SOLN
INTRAVENOUS | Status: AC
  Filled 2020-09-25: qty 100

## 2020-10-21 ENCOUNTER — Other Ambulatory Visit (HOSPITAL_COMMUNITY): Payer: Self-pay

## 2020-10-21 DIAGNOSIS — M47816 Spondylosis without myelopathy or radiculopathy, lumbar region: Secondary | ICD-10-CM | POA: Diagnosis not present

## 2020-10-21 DIAGNOSIS — G894 Chronic pain syndrome: Secondary | ICD-10-CM | POA: Diagnosis not present

## 2020-10-21 DIAGNOSIS — G4700 Insomnia, unspecified: Secondary | ICD-10-CM | POA: Diagnosis not present

## 2020-10-21 DIAGNOSIS — M461 Sacroiliitis, not elsewhere classified: Secondary | ICD-10-CM | POA: Diagnosis not present

## 2020-10-21 DIAGNOSIS — Z79891 Long term (current) use of opiate analgesic: Secondary | ICD-10-CM | POA: Diagnosis not present

## 2020-10-21 MED ORDER — DICLOFENAC SODIUM 1 % EX GEL
CUTANEOUS | 5 refills | Status: DC
  Filled 2020-10-21: qty 400, 25d supply, fill #0

## 2020-10-21 MED ORDER — BUPRENORPHINE 7.5 MCG/HR TD PTWK
MEDICATED_PATCH | TRANSDERMAL | 0 refills | Status: DC
  Filled 2020-10-21: qty 4, 28d supply, fill #0

## 2020-10-22 ENCOUNTER — Other Ambulatory Visit (HOSPITAL_COMMUNITY): Payer: Self-pay

## 2020-10-27 ENCOUNTER — Other Ambulatory Visit (HOSPITAL_COMMUNITY): Payer: Self-pay

## 2020-10-27 MED ORDER — DIAZEPAM 5 MG PO TABS
ORAL_TABLET | ORAL | 2 refills | Status: DC
  Filled 2020-10-27: qty 60, 20d supply, fill #0
  Filled 2021-01-12: qty 60, 20d supply, fill #1
  Filled 2021-03-06: qty 60, 20d supply, fill #2

## 2020-10-29 ENCOUNTER — Other Ambulatory Visit (HOSPITAL_COMMUNITY): Payer: Self-pay

## 2020-10-29 MED ORDER — FLUOXETINE HCL 20 MG PO CAPS
ORAL_CAPSULE | ORAL | 0 refills | Status: DC
  Filled 2020-10-29: qty 90, 90d supply, fill #0

## 2020-10-29 MED ORDER — QUETIAPINE FUMARATE 100 MG PO TABS
ORAL_TABLET | ORAL | 0 refills | Status: DC
  Filled 2020-10-29 – 2020-12-10 (×2): qty 135, 90d supply, fill #0

## 2020-10-29 MED ORDER — FLUOXETINE HCL 40 MG PO CAPS
ORAL_CAPSULE | ORAL | 0 refills | Status: DC
  Filled 2020-10-29 – 2021-01-12 (×2): qty 90, 90d supply, fill #0

## 2020-11-05 ENCOUNTER — Other Ambulatory Visit (HOSPITAL_COMMUNITY): Payer: Self-pay

## 2020-11-05 DIAGNOSIS — E7800 Pure hypercholesterolemia, unspecified: Secondary | ICD-10-CM | POA: Diagnosis not present

## 2020-11-05 DIAGNOSIS — M5136 Other intervertebral disc degeneration, lumbar region: Secondary | ICD-10-CM | POA: Diagnosis not present

## 2020-11-05 DIAGNOSIS — Z79899 Other long term (current) drug therapy: Secondary | ICD-10-CM | POA: Diagnosis not present

## 2020-11-05 DIAGNOSIS — Z0000 Encounter for general adult medical examination without abnormal findings: Secondary | ICD-10-CM | POA: Diagnosis not present

## 2020-11-05 DIAGNOSIS — R7303 Prediabetes: Secondary | ICD-10-CM | POA: Diagnosis not present

## 2020-11-05 DIAGNOSIS — I10 Essential (primary) hypertension: Secondary | ICD-10-CM | POA: Diagnosis not present

## 2020-11-05 DIAGNOSIS — R42 Dizziness and giddiness: Secondary | ICD-10-CM | POA: Diagnosis not present

## 2020-11-08 ENCOUNTER — Other Ambulatory Visit (HOSPITAL_COMMUNITY): Payer: Self-pay

## 2020-11-08 MED FILL — Atorvastatin Calcium Tab 80 MG (Base Equivalent): ORAL | 90 days supply | Qty: 90 | Fill #1 | Status: AC

## 2020-11-10 ENCOUNTER — Other Ambulatory Visit (HOSPITAL_COMMUNITY): Payer: Self-pay

## 2020-11-13 ENCOUNTER — Other Ambulatory Visit (HOSPITAL_COMMUNITY): Payer: Self-pay

## 2020-11-25 ENCOUNTER — Other Ambulatory Visit (HOSPITAL_COMMUNITY): Payer: Self-pay

## 2020-11-25 DIAGNOSIS — M461 Sacroiliitis, not elsewhere classified: Secondary | ICD-10-CM | POA: Diagnosis not present

## 2020-11-25 DIAGNOSIS — M47816 Spondylosis without myelopathy or radiculopathy, lumbar region: Secondary | ICD-10-CM | POA: Diagnosis not present

## 2020-11-25 DIAGNOSIS — G894 Chronic pain syndrome: Secondary | ICD-10-CM | POA: Diagnosis not present

## 2020-11-25 DIAGNOSIS — G4700 Insomnia, unspecified: Secondary | ICD-10-CM | POA: Diagnosis not present

## 2020-11-25 MED ORDER — HYDROXYZINE PAMOATE 25 MG PO CAPS
ORAL_CAPSULE | ORAL | 0 refills | Status: DC
  Filled 2020-11-25: qty 270, 90d supply, fill #0

## 2020-11-25 MED ORDER — GABAPENTIN 300 MG PO CAPS
ORAL_CAPSULE | ORAL | 0 refills | Status: DC
  Filled 2020-11-25: qty 360, 90d supply, fill #0

## 2020-11-25 MED ORDER — BUPRENORPHINE 7.5 MCG/HR TD PTWK
MEDICATED_PATCH | TRANSDERMAL | 0 refills | Status: DC
  Filled 2020-11-25: qty 4, 28d supply, fill #0

## 2020-11-25 MED ORDER — TIZANIDINE HCL 4 MG PO TABS
ORAL_TABLET | ORAL | 0 refills | Status: DC
  Filled 2020-11-25: qty 270, 90d supply, fill #0

## 2020-11-26 ENCOUNTER — Other Ambulatory Visit (HOSPITAL_COMMUNITY): Payer: Self-pay

## 2020-12-01 ENCOUNTER — Other Ambulatory Visit: Payer: Self-pay

## 2020-12-01 ENCOUNTER — Other Ambulatory Visit (HOSPITAL_COMMUNITY): Payer: Self-pay

## 2020-12-01 DIAGNOSIS — M7061 Trochanteric bursitis, right hip: Secondary | ICD-10-CM | POA: Diagnosis not present

## 2020-12-01 DIAGNOSIS — E782 Mixed hyperlipidemia: Secondary | ICD-10-CM

## 2020-12-02 ENCOUNTER — Other Ambulatory Visit (HOSPITAL_COMMUNITY): Payer: Self-pay

## 2020-12-08 ENCOUNTER — Other Ambulatory Visit (HOSPITAL_COMMUNITY): Payer: Self-pay

## 2020-12-08 ENCOUNTER — Other Ambulatory Visit: Payer: Self-pay | Admitting: Cardiovascular Disease

## 2020-12-08 MED ORDER — AMLODIPINE BESYLATE 5 MG PO TABS
ORAL_TABLET | Freq: Every day | ORAL | 0 refills | Status: DC
  Filled 2020-12-08 – 2020-12-25 (×2): qty 30, 30d supply, fill #0

## 2020-12-08 MED ORDER — CHLORTHALIDONE 25 MG PO TABS
ORAL_TABLET | ORAL | 0 refills | Status: DC
  Filled 2020-12-08 – 2020-12-25 (×2): qty 15, 30d supply, fill #0

## 2020-12-10 ENCOUNTER — Other Ambulatory Visit (HOSPITAL_COMMUNITY): Payer: Self-pay

## 2020-12-16 ENCOUNTER — Other Ambulatory Visit (HOSPITAL_COMMUNITY): Payer: Self-pay

## 2020-12-25 ENCOUNTER — Other Ambulatory Visit (HOSPITAL_COMMUNITY): Payer: Self-pay

## 2020-12-25 DIAGNOSIS — M47816 Spondylosis without myelopathy or radiculopathy, lumbar region: Secondary | ICD-10-CM | POA: Diagnosis not present

## 2020-12-25 DIAGNOSIS — G4700 Insomnia, unspecified: Secondary | ICD-10-CM | POA: Diagnosis not present

## 2020-12-25 DIAGNOSIS — G894 Chronic pain syndrome: Secondary | ICD-10-CM | POA: Diagnosis not present

## 2020-12-25 DIAGNOSIS — M461 Sacroiliitis, not elsewhere classified: Secondary | ICD-10-CM | POA: Diagnosis not present

## 2021-01-06 DIAGNOSIS — R1011 Right upper quadrant pain: Secondary | ICD-10-CM | POA: Diagnosis not present

## 2021-01-06 DIAGNOSIS — R110 Nausea: Secondary | ICD-10-CM | POA: Diagnosis not present

## 2021-01-08 DIAGNOSIS — Z0389 Encounter for observation for other suspected diseases and conditions ruled out: Secondary | ICD-10-CM | POA: Diagnosis not present

## 2021-01-08 DIAGNOSIS — R1011 Right upper quadrant pain: Secondary | ICD-10-CM | POA: Diagnosis not present

## 2021-01-12 ENCOUNTER — Other Ambulatory Visit: Payer: Self-pay | Admitting: Internal Medicine

## 2021-01-12 ENCOUNTER — Other Ambulatory Visit (HOSPITAL_COMMUNITY): Payer: Self-pay

## 2021-01-12 MED ORDER — DICYCLOMINE HCL 20 MG PO TABS
20.0000 mg | ORAL_TABLET | Freq: Two times a day (BID) | ORAL | 1 refills | Status: DC | PRN
  Filled 2021-01-12: qty 60, 30d supply, fill #0

## 2021-01-14 ENCOUNTER — Other Ambulatory Visit (HOSPITAL_COMMUNITY): Payer: Self-pay

## 2021-01-20 DIAGNOSIS — M461 Sacroiliitis, not elsewhere classified: Secondary | ICD-10-CM | POA: Diagnosis not present

## 2021-01-20 DIAGNOSIS — G4700 Insomnia, unspecified: Secondary | ICD-10-CM | POA: Diagnosis not present

## 2021-01-20 DIAGNOSIS — G894 Chronic pain syndrome: Secondary | ICD-10-CM | POA: Diagnosis not present

## 2021-01-20 DIAGNOSIS — Z79891 Long term (current) use of opiate analgesic: Secondary | ICD-10-CM | POA: Diagnosis not present

## 2021-01-20 DIAGNOSIS — M47816 Spondylosis without myelopathy or radiculopathy, lumbar region: Secondary | ICD-10-CM | POA: Diagnosis not present

## 2021-01-21 ENCOUNTER — Other Ambulatory Visit (HOSPITAL_COMMUNITY): Payer: Self-pay

## 2021-01-21 MED ORDER — QUETIAPINE FUMARATE 100 MG PO TABS
ORAL_TABLET | ORAL | 0 refills | Status: DC
  Filled 2021-01-21: qty 135, 90d supply, fill #0

## 2021-01-21 MED ORDER — FLUOXETINE HCL 20 MG PO CAPS
20.0000 mg | ORAL_CAPSULE | Freq: Every day | ORAL | 0 refills | Status: DC
  Filled 2021-01-21: qty 90, 90d supply, fill #0

## 2021-01-21 MED ORDER — FLUOXETINE HCL 40 MG PO CAPS
40.0000 mg | ORAL_CAPSULE | Freq: Every day | ORAL | 0 refills | Status: DC
  Filled 2021-01-21: qty 90, 90d supply, fill #0

## 2021-01-27 DIAGNOSIS — H40013 Open angle with borderline findings, low risk, bilateral: Secondary | ICD-10-CM | POA: Diagnosis not present

## 2021-01-29 ENCOUNTER — Other Ambulatory Visit (HOSPITAL_COMMUNITY): Payer: Self-pay

## 2021-02-04 ENCOUNTER — Telehealth: Payer: Self-pay | Admitting: Cardiovascular Disease

## 2021-02-04 ENCOUNTER — Other Ambulatory Visit: Payer: Self-pay | Admitting: Cardiovascular Disease

## 2021-02-04 ENCOUNTER — Other Ambulatory Visit (HOSPITAL_COMMUNITY): Payer: Self-pay

## 2021-02-04 MED ORDER — CHLORTHALIDONE 25 MG PO TABS
12.5000 mg | ORAL_TABLET | Freq: Every day | ORAL | 0 refills | Status: DC
  Filled 2021-02-04: qty 45, 90d supply, fill #0

## 2021-02-04 MED ORDER — ATORVASTATIN CALCIUM 80 MG PO TABS
ORAL_TABLET | Freq: Every day | ORAL | 3 refills | Status: DC
  Filled 2021-02-04: qty 90, 90d supply, fill #0
  Filled 2021-06-04: qty 90, 90d supply, fill #1
  Filled 2021-09-08: qty 90, 90d supply, fill #2

## 2021-02-04 MED ORDER — AMLODIPINE BESYLATE 5 MG PO TABS
5.0000 mg | ORAL_TABLET | Freq: Every day | ORAL | 0 refills | Status: DC
  Filled 2021-02-04: qty 90, 90d supply, fill #0

## 2021-02-04 NOTE — Telephone Encounter (Signed)
*  STAT* If patient is at the pharmacy, call can be transferred to refill team.   1. Which medications need to be refilled? (please list name of each medication and dose if known)  atorvastatin (LIPITOR) 80 MG tablet  2. Which pharmacy/location (including street and city if local pharmacy) is medication to be sent to? [facility]  3. Do they need a 30 day or 90 day supply? 90 with refills

## 2021-02-04 NOTE — Telephone Encounter (Signed)
Labs in care everywhere - lipid panel, CMET done 09/02/2020  Routed to MD/RN to advise if additional labs needed

## 2021-02-04 NOTE — Telephone Encounter (Signed)
Patient states she had lab work done in September with her PCP and would like to know if she needs to get labs done again prior to her appointment 03/05/2021. She states she knows one of the orders was to check her cholesterol.

## 2021-02-04 NOTE — Telephone Encounter (Signed)
Carly Gess, MD  You 8 minutes ago (3:36 PM)   If she had a recent fasting lipid profile by her PCP no reason to repeat.  If she has not had a fasting lipid profile within the last 12 months please get 1.    Fasting lipid done 08/2020 -- accessible in care everywhere. Advised no labs needed. Refilled amlodipine and chlorthalidone per request

## 2021-02-06 ENCOUNTER — Other Ambulatory Visit (HOSPITAL_COMMUNITY): Payer: Self-pay

## 2021-02-17 ENCOUNTER — Other Ambulatory Visit (HOSPITAL_COMMUNITY): Payer: Self-pay

## 2021-02-18 ENCOUNTER — Other Ambulatory Visit (HOSPITAL_COMMUNITY): Payer: Self-pay

## 2021-02-18 MED FILL — Hydroxyzine Pamoate Cap 25 MG: ORAL | 90 days supply | Qty: 270 | Fill #0 | Status: AC

## 2021-02-26 ENCOUNTER — Other Ambulatory Visit (HOSPITAL_COMMUNITY): Payer: Self-pay

## 2021-03-02 ENCOUNTER — Other Ambulatory Visit (HOSPITAL_COMMUNITY): Payer: Self-pay

## 2021-03-02 DIAGNOSIS — G4700 Insomnia, unspecified: Secondary | ICD-10-CM | POA: Diagnosis not present

## 2021-03-02 DIAGNOSIS — M461 Sacroiliitis, not elsewhere classified: Secondary | ICD-10-CM | POA: Diagnosis not present

## 2021-03-02 DIAGNOSIS — G894 Chronic pain syndrome: Secondary | ICD-10-CM | POA: Diagnosis not present

## 2021-03-02 DIAGNOSIS — M47816 Spondylosis without myelopathy or radiculopathy, lumbar region: Secondary | ICD-10-CM | POA: Diagnosis not present

## 2021-03-02 MED ORDER — GABAPENTIN 300 MG PO CAPS
ORAL_CAPSULE | ORAL | 0 refills | Status: DC
  Filled 2021-03-02: qty 360, 90d supply, fill #0

## 2021-03-02 MED ORDER — HYDROXYZINE PAMOATE 25 MG PO CAPS
ORAL_CAPSULE | ORAL | 0 refills | Status: DC
  Filled 2021-03-02: qty 270, 90d supply, fill #0

## 2021-03-02 MED ORDER — BACLOFEN 10 MG PO TABS
ORAL_TABLET | ORAL | 0 refills | Status: DC
  Filled 2021-03-02: qty 90, 30d supply, fill #0

## 2021-03-03 ENCOUNTER — Other Ambulatory Visit (HOSPITAL_COMMUNITY): Payer: Self-pay

## 2021-03-05 ENCOUNTER — Other Ambulatory Visit (HOSPITAL_COMMUNITY): Payer: Self-pay

## 2021-03-05 ENCOUNTER — Ambulatory Visit (INDEPENDENT_AMBULATORY_CARE_PROVIDER_SITE_OTHER): Payer: Medicare Other | Admitting: Nurse Practitioner

## 2021-03-05 ENCOUNTER — Encounter (HOSPITAL_BASED_OUTPATIENT_CLINIC_OR_DEPARTMENT_OTHER): Payer: Self-pay | Admitting: Nurse Practitioner

## 2021-03-05 ENCOUNTER — Other Ambulatory Visit: Payer: Self-pay

## 2021-03-05 VITALS — BP 132/89 | HR 80 | Ht 63.0 in | Wt 265.9 lb

## 2021-03-05 DIAGNOSIS — E876 Hypokalemia: Secondary | ICD-10-CM | POA: Diagnosis not present

## 2021-03-05 DIAGNOSIS — I10 Essential (primary) hypertension: Secondary | ICD-10-CM

## 2021-03-05 DIAGNOSIS — Z6841 Body Mass Index (BMI) 40.0 and over, adult: Secondary | ICD-10-CM

## 2021-03-05 DIAGNOSIS — R002 Palpitations: Secondary | ICD-10-CM

## 2021-03-05 MED ORDER — AMLODIPINE BESYLATE 5 MG PO TABS
5.0000 mg | ORAL_TABLET | Freq: Every day | ORAL | 3 refills | Status: DC
Start: 2021-03-05 — End: 2021-12-26
  Filled 2021-03-05 – 2021-05-21 (×2): qty 90, 90d supply, fill #0
  Filled 2021-09-07: qty 90, 90d supply, fill #1

## 2021-03-05 MED ORDER — CHLORTHALIDONE 25 MG PO TABS
12.5000 mg | ORAL_TABLET | Freq: Every day | ORAL | 3 refills | Status: DC
  Filled 2021-03-05 – 2021-05-21 (×2): qty 45, 90d supply, fill #0
  Filled 2021-08-13: qty 45, 90d supply, fill #1
  Filled 2021-11-19: qty 45, 90d supply, fill #2

## 2021-03-05 MED ORDER — FUROSEMIDE 20 MG PO TABS
20.0000 mg | ORAL_TABLET | Freq: Every day | ORAL | 3 refills | Status: AC | PRN
  Filled 2021-03-05: qty 90, 90d supply, fill #0

## 2021-03-05 MED ORDER — CARVEDILOL 12.5 MG PO TABS
ORAL_TABLET | Freq: Two times a day (BID) | ORAL | 3 refills | Status: DC
  Filled 2021-03-05: qty 180, 90d supply, fill #0
  Filled 2021-07-16: qty 180, 90d supply, fill #1
  Filled 2021-11-19: qty 180, 90d supply, fill #2

## 2021-03-05 MED ORDER — POTASSIUM CHLORIDE CRYS ER 10 MEQ PO TBCR
10.0000 meq | EXTENDED_RELEASE_TABLET | Freq: Every day | ORAL | 3 refills | Status: DC
  Filled 2021-03-05: qty 90, 90d supply, fill #0

## 2021-03-05 NOTE — Progress Notes (Signed)
Cardiology Office Note:    Date:  03/05/2021   ID:  Olegario Messier, DOB 25-Feb-1966, MRN [id number]  PCP:  Janie Morning, DO   [facility] Providers Cardiologist:  Quay Burow, MD     Referring MD: Janie Morning, DO   Chief Complaint: overdue 1 year f/u hypertension  History of Present Illness:    Carly Delacruz is a 56 y.o. female with a hx of HTN, sleep apnea, hyperlipidemia, GERD, and gastric bypass.   She was last seen in our office on 11/20/19 by Tommy Medal, [facility] for management of hypertension.Her BP was not well-controlled at that visit and chlorthalidone 12.5 mg once daily was added to her medication regimen.  She was advised to return for basic metabolic panel and office visit but there is no record of either of these.   Today, she is here alone and reports she made the appointment because she was unable to get her medications refilled.  She states she is feeling well and takes care of her mother full-time.  She denies chest pain, shortness of breath, lower extremity edema, fatigue, melena, hematuria, hemoptysis, diaphoresis, weakness, presyncope, and syncope. Approximately 2 times per week, she feels a fluttering sensation and like she needs to sit up to breath when she lays down at night.  Reports she feels better when she sits up.  States she also has terrible GERD. Denies PND.  Has not increased number of pillows to sleep on.  States she otherwise sleeps well and wakes up only to urinate.  Past Medical History:  Diagnosis Date   Allergy    Anemia    Anxiety    Arthritis 12/10/03   Atrophic vaginitis 2009   BV (bacterial vaginosis) 2006   Chronic back pain    Chronic LLQ pain    Chronic post-traumatic stress disorder (PTSD)    Complication of anesthesia    Elevated LFTs    Endometriosis    Fatigue 2012   Gallstones    GERD (gastroesophageal reflux disease)    H/O bladder infections    H/O mumps    H/O varicella    History of candidal vulvovaginitis  03/21/03   HTN (hypertension)    Insomnia    Insomnia secondary to situational depression 06/06/07   Liver mass, right lobe 08/13/04   Nodule   Malaise 2012   Morbid obesity ([place]) 2005   Osteoporosis    Pelvic floor dysfunction    S/P gastric bypass    Severe episode of recurrent major depressive disorder, without psychotic features ([place])    Sleep apnea 2005   SUI (stress urinary incontinence, female) 08/12/03   Trichomonas    Vitamin D deficiency    Vulvar lesion 03/21/03   Yeast infection     Past Surgical History:  Procedure Laterality Date   ABDOMINAL HYSTERECTOMY  2001   APPENDECTOMY  2001   CHOLECYSTECTOMY  2011   DILATION AND CURETTAGE OF UTERUS     GASTRIC BYPASS  2007   [facility], Dr. Hassell Done   KNEE ARTHROSCOPY Left    LAPAROTOMY     TONSILLECTOMY     WISDOM TOOTH EXTRACTION      Current Medications: Current Meds  Medication Sig   atorvastatin (LIPITOR) 80 MG tablet TAKE 1 TABLET BY MOUTH ONCE DAILY   baclofen (LIORESAL) 10 MG tablet Take 1 tablet by mouth three times a day.   buprenorphine (BUTRANS) 7.5 MCG/HR Apply 1 patch topically once a week   Cholecalciferol (VITAMIN D3)  1.25 MG (50000 UT) CAPS TAKE 1 CAPSULE BY MOUTH EVERY 7 DAYS FOR 8 WEEKS   diclofenac Sodium (VOLTAREN) 1 % GEL Apply 4 grams topically to the skin 4 times a day   dicyclomine (BENTYL) 20 MG tablet Take 1 tablet by mouth 2 times daily as needed for spasms. NEEDS OFFICE VISIT FOR FURTHER REFILLS   FLUoxetine (PROZAC) 20 MG capsule Take 1 capsule by mouth daily  (total of 60mg  daily)   FLUoxetine (PROZAC) 40 MG capsule Take 1 capsule (40 mg total) by mouth daily.   gabapentin (NEURONTIN) 300 MG capsule Take 300 mg by mouth 4 (four) times daily.   hydrOXYzine (VISTARIL) 25 MG capsule Take 25 mg by mouth as needed.   omeprazole (PRILOSEC) 20 MG capsule Take 20 mg by mouth daily.   QUEtiapine (SEROQUEL) 50 MG tablet Take 50 mg by mouth at bedtime.   tiZANidine (ZANAFLEX) 4 MG capsule  Take 4 mg by mouth 3 (three) times daily as needed for muscle spasms.   Turmeric POWD Take 1 each by mouth daily.   zoledronic acid (RECLAST) 5 MG/100ML SOLN injection INJECT 5 MG BY INTRAVENOUS ROUTE AS DIRECTED (TO BE ADMINISTERED AT MD OFFICE)   [DISCONTINUED] amLODipine (NORVASC) 5 MG tablet Take 1 tablet by mouth daily.   [DISCONTINUED] carvedilol (COREG) 12.5 MG tablet TAKE 1 TABLET BY MOUTH TWO TIMES DAILY   [DISCONTINUED] chlorthalidone (HYGROTON) 25 MG tablet Take 1/2 tablet by mouth daily.   [DISCONTINUED] furosemide (LASIX) 20 MG tablet Take 1 tablet (20 mg total) by mouth daily as needed for fluid or edema.   [DISCONTINUED] potassium chloride (K-DUR,KLOR-CON) 10 MEQ tablet Take 1 tablet (10 mEq total) by mouth daily. As directed with lasix     Allergies:   Morphine and related, Septra [sulfamethoxazole-trimethoprim], Sulfa antibiotics, Sulfamethoxazole-trimethoprim, Sulfasalazine, Ultram [tramadol], Ciprofloxacin, Darvocet [propoxyphene n-acetaminophen], Lisinopril, Adhesive [tape], Codeine, Demerol [meperidine], Hydromorphone, and Losartan   Social History   Socioeconomic History   Marital status: Single    Spouse name: Not on file   Number of children: 0   Years of education: Not on file   Highest education level: Bachelor's degree (e.g., BA, AB, BS)  Occupational History   Occupation: DISABILITY  Tobacco Use   Smoking status: Never   Smokeless tobacco: Never  Vaping Use   Vaping Use: Never used  Substance and Sexual Activity   Alcohol use: No   Drug use: No   Sexual activity: Never    Birth control/protection: Surgical    Comment: hysterectomy   Other Topics Concern   Not on file  Social History Narrative   Patient is singel. Her mother is living with her in a one level house, 2 steps to enter.    Social Determinants of Health   Financial Resource Strain: Not on file  Food Insecurity: Not on file  Transportation Needs: Not on file  Physical Activity: Not on  file  Stress: Not on file  Social Connections: Not on file     Family History: The patient's family history includes Arthritis in her mother; Cirrhosis in her father; Colitis in her sister; Deep vein thrombosis in her sister; Diabetes in her paternal grandmother; Heart attack in her mother; High blood pressure in her mother; Hypertension in her sister; Kidney disease in her brother; Leukemia in her maternal grandmother; Obstructive Sleep Apnea in her brother; Prostate cancer in her father. There is no history of Colon polyps, Pancreatic cancer, Esophageal cancer, or Stomach cancer.  ROS:   Please  see the history of present illness. All other systems reviewed and are negative.  Labs/Other Studies Reviewed:    The following studies were reviewed today:   Echo 10/19  Left ventricle:  The cavity size was normal. Wall thickness was  normal. Systolic function was normal. The estimated ejection  fraction was in the range of 60% to 65%. Wall motion was normal;  there were no regional wall motion abnormalities. Doppler  parameters are consistent with abnormal left ventricular relaxation  (grade 1 diastolic dysfunction).  -------------------------------------------------------------------  Aortic valve:   Structurally normal valve.   Cusp separation was  normal.  Doppler:  Transvalvular velocity was within the normal  range. There was no stenosis. There was no regurgitation.  -------------------------------------------------------------------  Aorta: Aortic root: The aortic root was normal in size.  Ascending aorta: The ascending aorta was normal in size.  -------------------------------------------------------------------  Mitral valve:   Structurally normal valve.   Leaflet separation was  normal.  Doppler:  Transvalvular velocity was within the normal  range. There was no evidence for stenosis. There was trivial  regurgitation.    Peak gradient (D): 3 mm Hg.   -------------------------------------------------------------------  Left atrium:  The atrium was normal in size.  -------------------------------------------------------------------  Right ventricle:  The cavity size was normal. Systolic function was  normal.  -------------------------------------------------------------------  Pulmonic valve:    The valve appears to be grossly normal.  Doppler:  There was no significant regurgitation.  -------------------------------------------------------------------  Tricuspid valve:   The valve appears to be grossly normal.  Doppler:  There was mild regurgitation.  -------------------------------------------------------------------  Pulmonary artery:   Systolic pressure was mildly increased.  -------------------------------------------------------------------  Right atrium:  The atrium was normal in size.  -------------------------------------------------------------------  Pericardium: There was no pericardial effusion.  -------------------------------------------------------------------  Systemic veins:  Inferior vena cava: The vessel was normal in size. The  respirophasic diameter changes were in the normal range (>= 50%),  consistent with normal central venous pressure   Recent Labs: Scanned labs from PCP completed on 09/16/20  Recent Lipid Panel    Component Value Date/Time   CHOL 175 02/06/2020 1204   TRIG 79 02/06/2020 1204   HDL 55 02/06/2020 1204   CHOLHDL 3.2 02/06/2020 1204   CHOLHDL 3 08/04/2017 1035   VLDL 16.6 08/04/2017 1035   LDLCALC 105 (H) 02/06/2020 1204      Physical Exam:    VS:  BP 132/89    Pulse 80    Ht 5\' 3"  (1.6 m)    Wt 265 lb 14.4 oz (120.6 kg)    BMI 47.10 kg/m     Wt Readings from Last 3 Encounters:  03/05/21 265 lb 14.4 oz (120.6 kg)  09/25/20 252 lb (114.3 kg)  04/28/20 272 lb 12.8 oz (123.7 kg)     GEN:  Well nourished, well developed in no acute distress HEENT: Normal NECK: No JVD; No  carotid bruits LYMPHATICS: No lymphadenopathy CARDIAC: RRR, no murmurs, rubs, gallops RESPIRATORY:  Clear to auscultation without rales, wheezing or rhonchi  ABDOMEN: Soft, non-tender, non-distended MUSCULOSKELETAL:  No edema; No deformity  SKIN: Warm and dry NEUROLOGIC:  Alert and oriented x 3 PSYCHIATRIC:  Normal affect   EKG:  EKG is  ordered today.  The ekg ordered today demonstrates NSR at rate of 80 bpm, nonspecific ST/T wave abnormality. No acute change from previous.  Diagnoses:    1. Hypokalemia   2. Essential hypertension   3. Class 3 severe obesity due to excess calories with serious comorbidity and  body mass index (BMI) of 45.0 to 49.9 in adult [facility])   4. Palpitations    Assessment and Plan:     Essential hypertension: Diastolic blood pressure is mildly elevated today.  She does not monitor on a consistent basis but reports at recent office visits blood pressure has been within with normal limits.  Reports on 1 occasion she was told her blood pressure was a little high.  I have asked her to monitor blood pressure on a consistent basis and report to [place] if it is consistently greater than 130/80.  Has Lasix prn for edema which she rarely takes. Continue amlodipine, chlorthalidone.  Palpitations: Palpitations occur approximately 2 times per week when she is laying down. These are not bothersome to her. Offered cardiac monitoring for classification of palpitations.  She politely declines.  Thinks her symptoms may be related to GERD.  Encouraged her to try over-the-counter Pepcid or Gaviscon in addition to her omeprazole. Advised her to contact us if these occur more often or if she would like to pursue cardiac monitor.  Hypokalemia: Potassium 3.4 on lab work from PCP done July 2022.  She had inadvertently stopped her potassium supplement.  Will refill today and advised her to increase dietary intake of potassium rich foods.  Chronic HFpEF: G1DD by echo 10/19.  She monitors her  weight and states she has a recent 4 pound weight loss.  No edema.  She appears euvolemic on exam.  States she rarely takes Lasix.  Disposition: 1 year f/u with Dr. Gwenlyn Found   Medication Adjustments/Labs and Tests Ordered: Current medicines are reviewed at length with the patient today.  Concerns regarding medicines are outlined above.  No orders of the defined types were placed in this encounter.  Meds ordered this encounter  Medications   furosemide (LASIX) 20 MG tablet    Sig: Take 1 tablet (20 mg total) by mouth daily as needed for fluid or edema.    Dispense:  90 tablet    Refill:  3    Order Specific Question:   Supervising Provider    Answer:   Mertie Moores J [[id number]]   potassium chloride (KLOR-CON M) 10 MEQ tablet    Sig: Take 1 tablet (10 mEq total) by mouth daily.    Dispense:  90 tablet    Refill:  3    patient has refills but needs new script for 90-days due to insurance    Order Specific Question:   Supervising Provider    Answer:   Thayer Headings [[id number]]   amLODipine (NORVASC) 5 MG tablet    Sig: Take 1 tablet by mouth daily.    Dispense:  90 tablet    Refill:  3   chlorthalidone (HYGROTON) 25 MG tablet    Sig: Take 1/2 tablet by mouth daily.    Dispense:  45 tablet    Refill:  3   carvedilol (COREG) 12.5 MG tablet    Sig: TAKE 1 TABLET BY MOUTH TWO TIMES DAILY    Dispense:  180 tablet    Refill:  3    Patient Instructions  Medication Instructions:  Your physician recommends that you continue on your current medications as directed. Please refer to the Current Medication list given to you today.  *If you need a refill on your cardiac medications before your next appointment, please call your pharmacy*  Follow-Up: At [facility], you and your health needs are our priority.  As part of our continuing mission to provide you  with exceptional heart care, we have created designated Provider Care Teams.  These Care Teams include your primary Cardiologist  (physician) and Advanced Practice Providers (APPs -  Physician Assistants and Nurse Practitioners) who all work together to provide you with the care you need, when you need it.  We recommend signing up for the patient portal called "MyChart".  Sign up information is provided on this After Visit Summary.  MyChart is used to connect with patients for Virtual Visits (Telemedicine).  Patients are able to view lab/test results, encounter notes, upcoming appointments, etc.  Non-urgent messages can be sent to your provider as well.   To learn more about what you can do with MyChart, go to [email or web address].    Your next appointment:   12 month(s)  The format for your next appointment:   In Person  Provider:   Quay Burow, MD      Signed, Emmaline Life, NP  03/05/2021 6:35 PM    [place]

## 2021-03-05 NOTE — Patient Instructions (Signed)

## 2021-03-06 ENCOUNTER — Encounter (HOSPITAL_BASED_OUTPATIENT_CLINIC_OR_DEPARTMENT_OTHER): Payer: Self-pay

## 2021-03-06 ENCOUNTER — Other Ambulatory Visit (HOSPITAL_COMMUNITY): Payer: Self-pay

## 2021-03-06 NOTE — Addendum Note (Signed)
Addended by: Marlene Lard on: 03/06/2021 08:01 AM   Modules accepted: Orders

## 2021-03-14 ENCOUNTER — Other Ambulatory Visit (HOSPITAL_COMMUNITY): Payer: Self-pay

## 2021-03-17 ENCOUNTER — Other Ambulatory Visit (HOSPITAL_COMMUNITY): Payer: Self-pay

## 2021-03-17 MED ORDER — FLUOXETINE HCL 40 MG PO CAPS
ORAL_CAPSULE | ORAL | 0 refills | Status: DC
  Filled 2021-03-17 – 2021-04-29 (×2): qty 90, 90d supply, fill #0

## 2021-03-17 MED ORDER — QUETIAPINE FUMARATE 100 MG PO TABS
ORAL_TABLET | ORAL | 0 refills | Status: DC
  Filled 2021-03-17: qty 135, 90d supply, fill #0

## 2021-03-17 MED ORDER — FLUOXETINE HCL 20 MG PO CAPS
ORAL_CAPSULE | ORAL | 0 refills | Status: DC
  Filled 2021-03-17: qty 90, 90d supply, fill #0

## 2021-03-18 ENCOUNTER — Other Ambulatory Visit (HOSPITAL_COMMUNITY): Payer: Self-pay

## 2021-03-24 ENCOUNTER — Other Ambulatory Visit (HOSPITAL_COMMUNITY): Payer: Self-pay

## 2021-04-29 ENCOUNTER — Other Ambulatory Visit (HOSPITAL_COMMUNITY): Payer: Self-pay

## 2021-04-29 DIAGNOSIS — M47816 Spondylosis without myelopathy or radiculopathy, lumbar region: Secondary | ICD-10-CM | POA: Diagnosis not present

## 2021-04-29 DIAGNOSIS — M461 Sacroiliitis, not elsewhere classified: Secondary | ICD-10-CM | POA: Diagnosis not present

## 2021-04-29 DIAGNOSIS — G4700 Insomnia, unspecified: Secondary | ICD-10-CM | POA: Diagnosis not present

## 2021-04-29 DIAGNOSIS — G894 Chronic pain syndrome: Secondary | ICD-10-CM | POA: Diagnosis not present

## 2021-04-29 DIAGNOSIS — Z79891 Long term (current) use of opiate analgesic: Secondary | ICD-10-CM | POA: Diagnosis not present

## 2021-04-29 MED ORDER — BACLOFEN 10 MG PO TABS
ORAL_TABLET | ORAL | 1 refills | Status: DC
  Filled 2021-04-29: qty 90, 30d supply, fill #0
  Filled 2021-06-15: qty 90, 30d supply, fill #1

## 2021-04-30 DIAGNOSIS — Z79891 Long term (current) use of opiate analgesic: Secondary | ICD-10-CM | POA: Diagnosis not present

## 2021-04-30 DIAGNOSIS — R7303 Prediabetes: Secondary | ICD-10-CM | POA: Diagnosis not present

## 2021-04-30 DIAGNOSIS — Z79899 Other long term (current) drug therapy: Secondary | ICD-10-CM | POA: Diagnosis not present

## 2021-04-30 DIAGNOSIS — G894 Chronic pain syndrome: Secondary | ICD-10-CM | POA: Diagnosis not present

## 2021-05-07 DIAGNOSIS — R7303 Prediabetes: Secondary | ICD-10-CM | POA: Diagnosis not present

## 2021-05-07 DIAGNOSIS — I10 Essential (primary) hypertension: Secondary | ICD-10-CM | POA: Diagnosis not present

## 2021-05-07 DIAGNOSIS — E559 Vitamin D deficiency, unspecified: Secondary | ICD-10-CM | POA: Diagnosis not present

## 2021-05-07 DIAGNOSIS — R946 Abnormal results of thyroid function studies: Secondary | ICD-10-CM | POA: Diagnosis not present

## 2021-05-07 DIAGNOSIS — M5136 Other intervertebral disc degeneration, lumbar region: Secondary | ICD-10-CM | POA: Diagnosis not present

## 2021-05-07 DIAGNOSIS — E7800 Pure hypercholesterolemia, unspecified: Secondary | ICD-10-CM | POA: Diagnosis not present

## 2021-05-16 ENCOUNTER — Other Ambulatory Visit (HOSPITAL_COMMUNITY): Payer: Self-pay

## 2021-05-18 ENCOUNTER — Other Ambulatory Visit (HOSPITAL_COMMUNITY): Payer: Self-pay

## 2021-05-21 ENCOUNTER — Other Ambulatory Visit (HOSPITAL_COMMUNITY): Payer: Self-pay

## 2021-05-21 DIAGNOSIS — R3915 Urgency of urination: Secondary | ICD-10-CM | POA: Diagnosis not present

## 2021-05-21 DIAGNOSIS — N398 Other specified disorders of urinary system: Secondary | ICD-10-CM | POA: Diagnosis not present

## 2021-05-21 MED ORDER — DIAZEPAM 5 MG PO TABS
ORAL_TABLET | ORAL | 2 refills | Status: DC
  Filled 2021-05-21: qty 60, 20d supply, fill #0
  Filled 2021-07-16: qty 60, 20d supply, fill #1
  Filled 2021-09-07: qty 60, 20d supply, fill #2

## 2021-05-23 ENCOUNTER — Other Ambulatory Visit (HOSPITAL_COMMUNITY): Payer: Self-pay

## 2021-05-27 ENCOUNTER — Other Ambulatory Visit (HOSPITAL_COMMUNITY): Payer: Self-pay

## 2021-05-27 DIAGNOSIS — M47816 Spondylosis without myelopathy or radiculopathy, lumbar region: Secondary | ICD-10-CM | POA: Diagnosis not present

## 2021-05-27 DIAGNOSIS — M461 Sacroiliitis, not elsewhere classified: Secondary | ICD-10-CM | POA: Diagnosis not present

## 2021-05-27 DIAGNOSIS — G4700 Insomnia, unspecified: Secondary | ICD-10-CM | POA: Diagnosis not present

## 2021-05-27 DIAGNOSIS — G894 Chronic pain syndrome: Secondary | ICD-10-CM | POA: Diagnosis not present

## 2021-06-04 ENCOUNTER — Other Ambulatory Visit (HOSPITAL_COMMUNITY): Payer: Self-pay

## 2021-06-05 ENCOUNTER — Other Ambulatory Visit (HOSPITAL_COMMUNITY): Payer: Self-pay

## 2021-06-08 ENCOUNTER — Other Ambulatory Visit (HOSPITAL_COMMUNITY): Payer: Self-pay

## 2021-06-09 ENCOUNTER — Other Ambulatory Visit (HOSPITAL_COMMUNITY): Payer: Self-pay

## 2021-06-09 MED ORDER — FLUOXETINE HCL 40 MG PO CAPS
ORAL_CAPSULE | ORAL | 0 refills | Status: DC
  Filled 2021-06-09 – 2021-08-13 (×2): qty 90, 90d supply, fill #0

## 2021-06-09 MED ORDER — FLUOXETINE HCL 20 MG PO CAPS
ORAL_CAPSULE | ORAL | 0 refills | Status: DC
  Filled 2021-06-09: qty 90, 90d supply, fill #0

## 2021-06-09 MED ORDER — QUETIAPINE FUMARATE 100 MG PO TABS
ORAL_TABLET | ORAL | 0 refills | Status: DC
  Filled 2021-06-09: qty 135, 90d supply, fill #0

## 2021-06-15 ENCOUNTER — Other Ambulatory Visit (HOSPITAL_COMMUNITY): Payer: Self-pay

## 2021-06-15 DIAGNOSIS — M629 Disorder of muscle, unspecified: Secondary | ICD-10-CM | POA: Diagnosis not present

## 2021-06-15 DIAGNOSIS — Z1239 Encounter for other screening for malignant neoplasm of breast: Secondary | ICD-10-CM | POA: Diagnosis not present

## 2021-06-15 DIAGNOSIS — E559 Vitamin D deficiency, unspecified: Secondary | ICD-10-CM | POA: Diagnosis not present

## 2021-06-15 DIAGNOSIS — Z1211 Encounter for screening for malignant neoplasm of colon: Secondary | ICD-10-CM | POA: Diagnosis not present

## 2021-06-15 DIAGNOSIS — Z139 Encounter for screening, unspecified: Secondary | ICD-10-CM | POA: Diagnosis not present

## 2021-06-15 DIAGNOSIS — Z1231 Encounter for screening mammogram for malignant neoplasm of breast: Secondary | ICD-10-CM | POA: Diagnosis not present

## 2021-06-15 DIAGNOSIS — Z13820 Encounter for screening for osteoporosis: Secondary | ICD-10-CM | POA: Diagnosis not present

## 2021-06-25 ENCOUNTER — Other Ambulatory Visit (HOSPITAL_COMMUNITY): Payer: Self-pay

## 2021-06-25 DIAGNOSIS — G894 Chronic pain syndrome: Secondary | ICD-10-CM | POA: Diagnosis not present

## 2021-06-25 DIAGNOSIS — M47816 Spondylosis without myelopathy or radiculopathy, lumbar region: Secondary | ICD-10-CM | POA: Diagnosis not present

## 2021-06-25 DIAGNOSIS — M461 Sacroiliitis, not elsewhere classified: Secondary | ICD-10-CM | POA: Diagnosis not present

## 2021-06-25 DIAGNOSIS — G4700 Insomnia, unspecified: Secondary | ICD-10-CM | POA: Diagnosis not present

## 2021-06-25 MED ORDER — BACLOFEN 10 MG PO TABS
10.0000 mg | ORAL_TABLET | Freq: Three times a day (TID) | ORAL | 1 refills | Status: DC
  Filled 2021-06-25 – 2021-08-13 (×2): qty 90, 30d supply, fill #0

## 2021-06-26 DIAGNOSIS — Z139 Encounter for screening, unspecified: Secondary | ICD-10-CM | POA: Diagnosis not present

## 2021-07-13 DIAGNOSIS — R3911 Hesitancy of micturition: Secondary | ICD-10-CM | POA: Diagnosis not present

## 2021-07-16 ENCOUNTER — Other Ambulatory Visit (HOSPITAL_COMMUNITY): Payer: Self-pay

## 2021-07-21 DIAGNOSIS — G4700 Insomnia, unspecified: Secondary | ICD-10-CM | POA: Diagnosis not present

## 2021-07-21 DIAGNOSIS — G894 Chronic pain syndrome: Secondary | ICD-10-CM | POA: Diagnosis not present

## 2021-07-21 DIAGNOSIS — M47816 Spondylosis without myelopathy or radiculopathy, lumbar region: Secondary | ICD-10-CM | POA: Diagnosis not present

## 2021-07-21 DIAGNOSIS — M461 Sacroiliitis, not elsewhere classified: Secondary | ICD-10-CM | POA: Diagnosis not present

## 2021-07-28 DIAGNOSIS — R3911 Hesitancy of micturition: Secondary | ICD-10-CM | POA: Diagnosis not present

## 2021-08-13 ENCOUNTER — Other Ambulatory Visit (HOSPITAL_COMMUNITY): Payer: Self-pay

## 2021-08-13 MED ORDER — QUETIAPINE FUMARATE 100 MG PO TABS
ORAL_TABLET | ORAL | 0 refills | Status: DC
  Filled 2021-08-13: qty 135, 90d supply, fill #0

## 2021-08-14 ENCOUNTER — Other Ambulatory Visit (HOSPITAL_COMMUNITY): Payer: Self-pay

## 2021-08-17 ENCOUNTER — Other Ambulatory Visit (HOSPITAL_COMMUNITY): Payer: Self-pay

## 2021-08-19 ENCOUNTER — Other Ambulatory Visit (HOSPITAL_COMMUNITY): Payer: Self-pay

## 2021-08-19 DIAGNOSIS — M47816 Spondylosis without myelopathy or radiculopathy, lumbar region: Secondary | ICD-10-CM | POA: Diagnosis not present

## 2021-08-19 DIAGNOSIS — G4700 Insomnia, unspecified: Secondary | ICD-10-CM | POA: Diagnosis not present

## 2021-08-19 DIAGNOSIS — G894 Chronic pain syndrome: Secondary | ICD-10-CM | POA: Diagnosis not present

## 2021-08-19 DIAGNOSIS — M461 Sacroiliitis, not elsewhere classified: Secondary | ICD-10-CM | POA: Diagnosis not present

## 2021-08-19 MED ORDER — GABAPENTIN 300 MG PO CAPS
ORAL_CAPSULE | ORAL | 0 refills | Status: DC
  Filled 2021-08-19: qty 360, 90d supply, fill #0

## 2021-08-19 MED ORDER — HYDROXYZINE PAMOATE 25 MG PO CAPS
ORAL_CAPSULE | ORAL | 0 refills | Status: DC
  Filled 2021-08-19: qty 270, 90d supply, fill #0

## 2021-08-21 ENCOUNTER — Other Ambulatory Visit (HOSPITAL_COMMUNITY): Payer: Self-pay

## 2021-08-24 DIAGNOSIS — R3911 Hesitancy of micturition: Secondary | ICD-10-CM | POA: Diagnosis not present

## 2021-08-27 ENCOUNTER — Other Ambulatory Visit (HOSPITAL_COMMUNITY): Payer: Self-pay

## 2021-08-27 DIAGNOSIS — M810 Age-related osteoporosis without current pathological fracture: Secondary | ICD-10-CM | POA: Diagnosis not present

## 2021-08-27 DIAGNOSIS — Z9884 Bariatric surgery status: Secondary | ICD-10-CM | POA: Diagnosis not present

## 2021-08-27 DIAGNOSIS — E559 Vitamin D deficiency, unspecified: Secondary | ICD-10-CM | POA: Diagnosis not present

## 2021-08-27 DIAGNOSIS — I10 Essential (primary) hypertension: Secondary | ICD-10-CM | POA: Diagnosis not present

## 2021-08-27 MED ORDER — OMEPRAZOLE 20 MG PO CPDR
DELAYED_RELEASE_CAPSULE | ORAL | 3 refills | Status: DC
  Filled 2021-08-27: qty 90, 90d supply, fill #0

## 2021-08-28 ENCOUNTER — Other Ambulatory Visit (HOSPITAL_COMMUNITY): Payer: Self-pay

## 2021-08-31 ENCOUNTER — Other Ambulatory Visit (HOSPITAL_COMMUNITY): Payer: Self-pay

## 2021-08-31 MED ORDER — QUETIAPINE FUMARATE 200 MG PO TABS
ORAL_TABLET | ORAL | 0 refills | Status: DC
  Filled 2021-08-31: qty 90, 90d supply, fill #0

## 2021-08-31 MED ORDER — FLUOXETINE HCL 40 MG PO CAPS
ORAL_CAPSULE | ORAL | 0 refills | Status: DC
  Filled 2021-08-31: qty 90, 90d supply, fill #0
  Filled 2021-10-22: qty 30, 30d supply, fill #0

## 2021-08-31 MED ORDER — FLUOXETINE HCL 20 MG PO CAPS
ORAL_CAPSULE | ORAL | 0 refills | Status: DC
  Filled 2021-08-31: qty 90, 90d supply, fill #0

## 2021-09-07 ENCOUNTER — Other Ambulatory Visit (HOSPITAL_COMMUNITY): Payer: Self-pay

## 2021-09-08 ENCOUNTER — Other Ambulatory Visit (HOSPITAL_COMMUNITY): Payer: Self-pay

## 2021-09-08 DIAGNOSIS — R3911 Hesitancy of micturition: Secondary | ICD-10-CM | POA: Diagnosis not present

## 2021-09-21 DIAGNOSIS — R3911 Hesitancy of micturition: Secondary | ICD-10-CM | POA: Diagnosis not present

## 2021-09-23 DIAGNOSIS — M47816 Spondylosis without myelopathy or radiculopathy, lumbar region: Secondary | ICD-10-CM | POA: Diagnosis not present

## 2021-09-23 DIAGNOSIS — G4700 Insomnia, unspecified: Secondary | ICD-10-CM | POA: Diagnosis not present

## 2021-09-23 DIAGNOSIS — G894 Chronic pain syndrome: Secondary | ICD-10-CM | POA: Diagnosis not present

## 2021-09-23 DIAGNOSIS — M461 Sacroiliitis, not elsewhere classified: Secondary | ICD-10-CM | POA: Diagnosis not present

## 2021-10-22 ENCOUNTER — Other Ambulatory Visit (HOSPITAL_COMMUNITY): Payer: Self-pay

## 2021-10-22 DIAGNOSIS — M461 Sacroiliitis, not elsewhere classified: Secondary | ICD-10-CM | POA: Diagnosis not present

## 2021-10-22 DIAGNOSIS — G4700 Insomnia, unspecified: Secondary | ICD-10-CM | POA: Diagnosis not present

## 2021-10-22 DIAGNOSIS — M47816 Spondylosis without myelopathy or radiculopathy, lumbar region: Secondary | ICD-10-CM | POA: Diagnosis not present

## 2021-10-22 DIAGNOSIS — Z79891 Long term (current) use of opiate analgesic: Secondary | ICD-10-CM | POA: Diagnosis not present

## 2021-10-22 DIAGNOSIS — G894 Chronic pain syndrome: Secondary | ICD-10-CM | POA: Diagnosis not present

## 2021-11-04 DIAGNOSIS — R7303 Prediabetes: Secondary | ICD-10-CM | POA: Diagnosis not present

## 2021-11-04 DIAGNOSIS — E559 Vitamin D deficiency, unspecified: Secondary | ICD-10-CM | POA: Diagnosis not present

## 2021-11-04 DIAGNOSIS — R946 Abnormal results of thyroid function studies: Secondary | ICD-10-CM | POA: Diagnosis not present

## 2021-11-04 DIAGNOSIS — I10 Essential (primary) hypertension: Secondary | ICD-10-CM | POA: Diagnosis not present

## 2021-11-04 DIAGNOSIS — E7800 Pure hypercholesterolemia, unspecified: Secondary | ICD-10-CM | POA: Diagnosis not present

## 2021-11-09 ENCOUNTER — Other Ambulatory Visit (HOSPITAL_COMMUNITY): Payer: Self-pay

## 2021-11-09 MED ORDER — QUETIAPINE FUMARATE 200 MG PO TABS
200.0000 mg | ORAL_TABLET | Freq: Every evening | ORAL | 1 refills | Status: DC
  Filled 2021-11-09 – 2021-11-19 (×2): qty 90, 90d supply, fill #0

## 2021-11-09 MED ORDER — FLUOXETINE HCL 20 MG PO CAPS
20.0000 mg | ORAL_CAPSULE | Freq: Every day | ORAL | 1 refills | Status: AC
  Filled 2021-11-09 – 2021-11-19 (×2): qty 90, 90d supply, fill #0
  Filled 2022-05-03: qty 90, 90d supply, fill #1

## 2021-11-09 MED ORDER — FLUOXETINE HCL 40 MG PO CAPS
40.0000 mg | ORAL_CAPSULE | Freq: Every day | ORAL | 1 refills | Status: DC
  Filled 2021-11-09 – 2021-11-19 (×2): qty 90, 90d supply, fill #0

## 2021-11-11 DIAGNOSIS — R5383 Other fatigue: Secondary | ICD-10-CM | POA: Diagnosis not present

## 2021-11-11 DIAGNOSIS — Z9884 Bariatric surgery status: Secondary | ICD-10-CM | POA: Diagnosis not present

## 2021-11-11 DIAGNOSIS — I10 Essential (primary) hypertension: Secondary | ICD-10-CM | POA: Diagnosis not present

## 2021-11-11 DIAGNOSIS — R7303 Prediabetes: Secondary | ICD-10-CM | POA: Diagnosis not present

## 2021-11-11 DIAGNOSIS — M810 Age-related osteoporosis without current pathological fracture: Secondary | ICD-10-CM | POA: Diagnosis not present

## 2021-11-11 DIAGNOSIS — E7800 Pure hypercholesterolemia, unspecified: Secondary | ICD-10-CM | POA: Diagnosis not present

## 2021-11-11 DIAGNOSIS — R748 Abnormal levels of other serum enzymes: Secondary | ICD-10-CM | POA: Diagnosis not present

## 2021-11-11 DIAGNOSIS — Z0000 Encounter for general adult medical examination without abnormal findings: Secondary | ICD-10-CM | POA: Diagnosis not present

## 2021-11-13 ENCOUNTER — Other Ambulatory Visit (HOSPITAL_COMMUNITY): Payer: Self-pay

## 2021-11-18 ENCOUNTER — Other Ambulatory Visit (HOSPITAL_COMMUNITY): Payer: Self-pay

## 2021-11-19 ENCOUNTER — Other Ambulatory Visit (HOSPITAL_COMMUNITY): Payer: Self-pay

## 2021-11-26 ENCOUNTER — Other Ambulatory Visit (HOSPITAL_COMMUNITY): Payer: Self-pay

## 2021-11-26 DIAGNOSIS — R3915 Urgency of urination: Secondary | ICD-10-CM | POA: Diagnosis not present

## 2021-11-26 DIAGNOSIS — N398 Other specified disorders of urinary system: Secondary | ICD-10-CM | POA: Diagnosis not present

## 2021-11-26 MED ORDER — DIAZEPAM 5 MG PO TABS
5.0000 mg | ORAL_TABLET | ORAL | 2 refills | Status: AC
  Filled 2021-11-26: qty 60, 20d supply, fill #0
  Filled 2022-01-05: qty 60, 20d supply, fill #1

## 2021-11-27 ENCOUNTER — Other Ambulatory Visit (HOSPITAL_COMMUNITY): Payer: Self-pay

## 2021-12-14 DIAGNOSIS — M810 Age-related osteoporosis without current pathological fracture: Secondary | ICD-10-CM | POA: Diagnosis not present

## 2021-12-21 DIAGNOSIS — M461 Sacroiliitis, not elsewhere classified: Secondary | ICD-10-CM | POA: Diagnosis not present

## 2021-12-21 DIAGNOSIS — G4700 Insomnia, unspecified: Secondary | ICD-10-CM | POA: Diagnosis not present

## 2021-12-21 DIAGNOSIS — G894 Chronic pain syndrome: Secondary | ICD-10-CM | POA: Diagnosis not present

## 2021-12-21 DIAGNOSIS — M47816 Spondylosis without myelopathy or radiculopathy, lumbar region: Secondary | ICD-10-CM | POA: Diagnosis not present

## 2021-12-24 ENCOUNTER — Emergency Department (HOSPITAL_COMMUNITY): Payer: Medicare Other

## 2021-12-24 ENCOUNTER — Encounter (HOSPITAL_COMMUNITY): Payer: Self-pay

## 2021-12-24 ENCOUNTER — Inpatient Hospital Stay (HOSPITAL_COMMUNITY)
Admission: EM | Admit: 2021-12-24 | Discharge: 2021-12-26 | DRG: 683 | Disposition: A | Discharge status: Home / Self Care | Payer: Medicare Other | Attending: Family Medicine | Admitting: Family Medicine

## 2021-12-24 DIAGNOSIS — J0100 Acute maxillary sinusitis, unspecified: Secondary | ICD-10-CM | POA: Diagnosis present

## 2021-12-24 DIAGNOSIS — Z881 Allergy status to other antibiotic agents status: Secondary | ICD-10-CM

## 2021-12-24 DIAGNOSIS — T428X5A Adverse effect of antiparkinsonism drugs and other central muscle-tone depressants, initial encounter: Secondary | ICD-10-CM | POA: Diagnosis not present

## 2021-12-24 DIAGNOSIS — Z79899 Other long term (current) drug therapy: Secondary | ICD-10-CM | POA: Diagnosis not present

## 2021-12-24 DIAGNOSIS — R202 Paresthesia of skin: Secondary | ICD-10-CM | POA: Diagnosis not present

## 2021-12-24 DIAGNOSIS — Z806 Family history of leukemia: Secondary | ICD-10-CM | POA: Diagnosis not present

## 2021-12-24 DIAGNOSIS — R519 Headache, unspecified: Secondary | ICD-10-CM

## 2021-12-24 DIAGNOSIS — F32A Depression, unspecified: Secondary | ICD-10-CM | POA: Diagnosis present

## 2021-12-24 DIAGNOSIS — Z6841 Body Mass Index (BMI) 40.0 and over, adult: Secondary | ICD-10-CM

## 2021-12-24 DIAGNOSIS — Z743 Need for continuous supervision: Secondary | ICD-10-CM | POA: Diagnosis not present

## 2021-12-24 DIAGNOSIS — R200 Anesthesia of skin: Secondary | ICD-10-CM

## 2021-12-24 DIAGNOSIS — F419 Anxiety disorder, unspecified: Secondary | ICD-10-CM

## 2021-12-24 DIAGNOSIS — M549 Dorsalgia, unspecified: Secondary | ICD-10-CM | POA: Diagnosis not present

## 2021-12-24 DIAGNOSIS — Z882 Allergy status to sulfonamides status: Secondary | ICD-10-CM

## 2021-12-24 DIAGNOSIS — R251 Tremor, unspecified: Secondary | ICD-10-CM | POA: Diagnosis present

## 2021-12-24 DIAGNOSIS — R29818 Other symptoms and signs involving the nervous system: Secondary | ICD-10-CM | POA: Diagnosis not present

## 2021-12-24 DIAGNOSIS — M6281 Muscle weakness (generalized): Secondary | ICD-10-CM | POA: Diagnosis not present

## 2021-12-24 DIAGNOSIS — E785 Hyperlipidemia, unspecified: Secondary | ICD-10-CM | POA: Diagnosis present

## 2021-12-24 DIAGNOSIS — I10 Essential (primary) hypertension: Secondary | ICD-10-CM | POA: Diagnosis not present

## 2021-12-24 DIAGNOSIS — G4489 Other headache syndrome: Secondary | ICD-10-CM | POA: Diagnosis not present

## 2021-12-24 DIAGNOSIS — Z888 Allergy status to other drugs, medicaments and biological substances status: Secondary | ICD-10-CM

## 2021-12-24 DIAGNOSIS — R531 Weakness: Secondary | ICD-10-CM | POA: Diagnosis not present

## 2021-12-24 DIAGNOSIS — Z9049 Acquired absence of other specified parts of digestive tract: Secondary | ICD-10-CM

## 2021-12-24 DIAGNOSIS — F4310 Post-traumatic stress disorder, unspecified: Secondary | ICD-10-CM | POA: Diagnosis present

## 2021-12-24 DIAGNOSIS — N179 Acute kidney failure, unspecified: Secondary | ICD-10-CM | POA: Diagnosis not present

## 2021-12-24 DIAGNOSIS — Z90710 Acquired absence of both cervix and uterus: Secondary | ICD-10-CM

## 2021-12-24 DIAGNOSIS — R7989 Other specified abnormal findings of blood chemistry: Secondary | ICD-10-CM | POA: Diagnosis not present

## 2021-12-24 DIAGNOSIS — R404 Transient alteration of awareness: Secondary | ICD-10-CM | POA: Diagnosis not present

## 2021-12-24 DIAGNOSIS — E876 Hypokalemia: Secondary | ICD-10-CM | POA: Diagnosis present

## 2021-12-24 DIAGNOSIS — Z87892 Personal history of anaphylaxis: Secondary | ICD-10-CM

## 2021-12-24 DIAGNOSIS — Z8249 Family history of ischemic heart disease and other diseases of the circulatory system: Secondary | ICD-10-CM | POA: Diagnosis not present

## 2021-12-24 DIAGNOSIS — G43909 Migraine, unspecified, not intractable, without status migrainosus: Secondary | ICD-10-CM | POA: Diagnosis present

## 2021-12-24 DIAGNOSIS — K219 Gastro-esophageal reflux disease without esophagitis: Secondary | ICD-10-CM | POA: Diagnosis present

## 2021-12-24 DIAGNOSIS — Z8261 Family history of arthritis: Secondary | ICD-10-CM | POA: Diagnosis not present

## 2021-12-24 DIAGNOSIS — Z833 Family history of diabetes mellitus: Secondary | ICD-10-CM

## 2021-12-24 DIAGNOSIS — T426X5A Adverse effect of other antiepileptic and sedative-hypnotic drugs, initial encounter: Secondary | ICD-10-CM | POA: Diagnosis present

## 2021-12-24 DIAGNOSIS — I959 Hypotension, unspecified: Secondary | ICD-10-CM | POA: Diagnosis not present

## 2021-12-24 DIAGNOSIS — G8929 Other chronic pain: Secondary | ICD-10-CM | POA: Diagnosis present

## 2021-12-24 DIAGNOSIS — R42 Dizziness and giddiness: Secondary | ICD-10-CM | POA: Diagnosis not present

## 2021-12-24 DIAGNOSIS — R079 Chest pain, unspecified: Secondary | ICD-10-CM | POA: Diagnosis not present

## 2021-12-24 DIAGNOSIS — Z841 Family history of disorders of kidney and ureter: Secondary | ICD-10-CM

## 2021-12-24 DIAGNOSIS — G629 Polyneuropathy, unspecified: Secondary | ICD-10-CM | POA: Diagnosis not present

## 2021-12-24 DIAGNOSIS — Z9884 Bariatric surgery status: Secondary | ICD-10-CM

## 2021-12-24 DIAGNOSIS — Z91048 Other nonmedicinal substance allergy status: Secondary | ICD-10-CM

## 2021-12-24 DIAGNOSIS — H579 Unspecified disorder of eye and adnexa: Secondary | ICD-10-CM | POA: Diagnosis not present

## 2021-12-24 LAB — CBC WITH DIFFERENTIAL/PLATELET
Abs Immature Granulocytes: 0.03 10*3/uL (ref 0.00–0.07)
Basophils Absolute: 0 10*3/uL (ref 0.0–0.1)
Basophils Relative: 0 %
Eosinophils Absolute: 0 10*3/uL (ref 0.0–0.5)
Eosinophils Relative: 0 %
HCT: 42.2 % (ref 36.0–46.0)
Hemoglobin: 13.3 g/dL (ref 12.0–15.0)
Immature Granulocytes: 0 %
Lymphocytes Relative: 23 %
Lymphs Abs: 2.3 10*3/uL (ref 0.7–4.0)
MCH: 27 pg (ref 26.0–34.0)
MCHC: 31.5 g/dL (ref 30.0–36.0)
MCV: 85.6 fL (ref 80.0–100.0)
Monocytes Absolute: 0.8 10*3/uL (ref 0.1–1.0)
Monocytes Relative: 8 %
Neutro Abs: 7.1 10*3/uL (ref 1.7–7.7)
Neutrophils Relative %: 69 %
Platelets: 256 10*3/uL (ref 150–400)
RBC: 4.93 MIL/uL (ref 3.87–5.11)
RDW: 15 % (ref 11.5–15.5)
WBC: 10.3 10*3/uL (ref 4.0–10.5)
nRBC: 0 % (ref 0.0–0.2)

## 2021-12-24 LAB — COMPREHENSIVE METABOLIC PANEL
ALT: 83 U/L — ABNORMAL HIGH (ref 0–44)
AST: 63 U/L — ABNORMAL HIGH (ref 15–41)
Albumin: 4.1 g/dL (ref 3.5–5.0)
Alkaline Phosphatase: 101 U/L (ref 38–126)
Anion gap: 11 (ref 5–15)
BUN: 43 mg/dL — ABNORMAL HIGH (ref 6–20)
CO2: 27 mmol/L (ref 22–32)
Calcium: 9.8 mg/dL (ref 8.9–10.3)
Chloride: 100 mmol/L (ref 98–111)
Creatinine, Ser: 4.17 mg/dL — ABNORMAL HIGH (ref 0.44–1.00)
GFR, Estimated: 12 mL/min — ABNORMAL LOW (ref >60)
Glucose, Bld: 142 mg/dL — ABNORMAL HIGH (ref 70–99)
Potassium: 3.5 mmol/L (ref 3.5–5.1)
Sodium: 138 mmol/L (ref 135–145)
Total Bilirubin: 0.5 mg/dL (ref 0.3–1.2)
Total Protein: 8 g/dL (ref 6.5–8.1)

## 2021-12-24 LAB — HIV ANTIBODY (ROUTINE TESTING W REFLEX): HIV Screen 4th Generation wRfx: NONREACTIVE

## 2021-12-24 MED ORDER — AMLODIPINE BESYLATE 5 MG PO TABS
5.0000 mg | ORAL_TABLET | Freq: Every day | ORAL | Status: DC
  Administered 2021-12-24: 5 mg via ORAL
  Filled 2021-12-24 (×2): qty 1

## 2021-12-24 MED ORDER — ONDANSETRON HCL 4 MG PO TABS
4.0000 mg | ORAL_TABLET | Freq: Four times a day (QID) | ORAL | Status: DC | PRN
  Administered 2021-12-25: 4 mg via ORAL
  Filled 2021-12-24: qty 1

## 2021-12-24 MED ORDER — LORAZEPAM 2 MG/ML IJ SOLN
0.5000 mg | INTRAMUSCULAR | Status: AC
  Administered 2021-12-24: 0.5 mg via INTRAVENOUS
  Filled 2021-12-24: qty 1

## 2021-12-24 MED ORDER — SODIUM CHLORIDE 0.9 % IV SOLN: INTRAVENOUS | Status: DC

## 2021-12-24 MED ORDER — ACETAMINOPHEN 650 MG RE SUPP: 650.0000 mg | Freq: Four times a day (QID) | RECTAL | Status: DC | PRN

## 2021-12-24 MED ORDER — FLUOXETINE HCL 40 MG PO CAPS: 40.0000 mg | ORAL_CAPSULE | Freq: Every day | ORAL | Status: DC

## 2021-12-24 MED ORDER — FLUOXETINE HCL 20 MG PO CAPS: 40.0000 mg | ORAL_CAPSULE | Freq: Every day | ORAL | Status: DC

## 2021-12-24 MED ORDER — FLUOXETINE HCL 20 MG PO CAPS
60.0000 mg | ORAL_CAPSULE | Freq: Every day | ORAL | Status: DC
  Administered 2021-12-24 – 2021-12-26 (×3): 60 mg via ORAL
  Filled 2021-12-24 (×3): qty 3

## 2021-12-24 MED ORDER — OXYCODONE HCL 5 MG PO TABS
5.0000 mg | ORAL_TABLET | ORAL | Status: DC | PRN
  Administered 2021-12-24 – 2021-12-26 (×2): 5 mg via ORAL
  Filled 2021-12-24 (×3): qty 1

## 2021-12-24 MED ORDER — ONDANSETRON HCL 4 MG/2ML IJ SOLN: 4.0000 mg | Freq: Four times a day (QID) | INTRAMUSCULAR | Status: DC | PRN

## 2021-12-24 MED ORDER — DIAZEPAM 5 MG PO TABS: 5.0000 mg | ORAL_TABLET | Freq: Three times a day (TID) | ORAL | Status: DC | PRN

## 2021-12-24 MED ORDER — ATORVASTATIN CALCIUM 40 MG PO TABS
80.0000 mg | ORAL_TABLET | Freq: Every day | ORAL | Status: DC
  Administered 2021-12-24 – 2021-12-26 (×3): 80 mg via ORAL
  Filled 2021-12-24 (×3): qty 2

## 2021-12-24 MED ORDER — PANTOPRAZOLE SODIUM 40 MG PO TBEC
40.0000 mg | DELAYED_RELEASE_TABLET | Freq: Every day | ORAL | Status: DC
  Administered 2021-12-24 – 2021-12-26 (×3): 40 mg via ORAL
  Filled 2021-12-24 (×3): qty 1

## 2021-12-24 MED ORDER — ACETAMINOPHEN 325 MG PO TABS
650.0000 mg | ORAL_TABLET | Freq: Four times a day (QID) | ORAL | Status: DC | PRN
  Administered 2021-12-25 – 2021-12-26 (×3): 650 mg via ORAL
  Filled 2021-12-24 (×3): qty 2

## 2021-12-24 MED ORDER — CARVEDILOL 12.5 MG PO TABS
12.5000 mg | ORAL_TABLET | Freq: Two times a day (BID) | ORAL | Status: DC
  Administered 2021-12-24 – 2021-12-26 (×3): 12.5 mg via ORAL
  Filled 2021-12-24 (×4): qty 1

## 2021-12-24 MED ORDER — QUETIAPINE FUMARATE 200 MG PO TABS
200.0000 mg | ORAL_TABLET | Freq: Every day | ORAL | Status: DC
  Administered 2021-12-24 – 2021-12-25 (×2): 200 mg via ORAL
  Filled 2021-12-24 (×2): qty 1

## 2021-12-24 NOTE — ED Provider Notes (Signed)
[facility]-EMERGENCY DEPT Provider Note   CSN: [id number] Arrival date & time: 12/24/21  8341     History  No chief complaint on file.   Carly Delacruz is a 56 y.o. female.  HPI Patient presents few different complaints.  Reportedly on the 24th with today being a 26 she developed some left-sided weakness in the left leg.  States there is pain in the leg.  Normally has chronic back pain but usually not in the left leg.  Also states she felt tingly and shaky over all her bodies.  Also has a headache.  Feels dizzy.  States the headache began on the right side of her head and is tender when she presses there but also hurts on the left side of her head.  States she did have brief double vision that has resolved.  States that she now also had some circles in her vision that have also gone away. Patient states that yesterday she did have a decrease in her pain patch.   Past Medical History:  Diagnosis Date   Allergy    Anemia    Anxiety    Arthritis 12/10/03   Atrophic vaginitis 2009   BV (bacterial vaginosis) 2006   Chronic back pain    Chronic LLQ pain    Chronic post-traumatic stress disorder (PTSD)    Complication of anesthesia    Elevated LFTs    Endometriosis    Fatigue 2012   Gallstones    GERD (gastroesophageal reflux disease)    H/O bladder infections    H/O mumps    H/O varicella    History of candidal vulvovaginitis 03/21/03   HTN (hypertension)    Insomnia    Insomnia secondary to situational depression 06/06/07   Liver mass, right lobe 08/13/04   Nodule   Malaise 2012   Morbid obesity (HCC) 2005   Osteoporosis    Pelvic floor dysfunction    S/P gastric bypass    Severe episode of recurrent major depressive disorder, without psychotic features (HCC)    Sleep apnea 2005   SUI (stress urinary incontinence, female) 08/12/03   Trichomonas    Vitamin D deficiency    Vulvar lesion 03/21/03   Yeast infection     Home Medications Prior to  Admission medications   Medication Sig Start Date End Date Taking? Authorizing Provider  amLODipine (NORVASC) 5 MG tablet Take 1 tablet by mouth daily. 03/05/21 03/05/22  Swinyer, Zachary George, NP  atorvastatin (LIPITOR) 80 MG tablet TAKE 1 TABLET BY MOUTH ONCE DAILY 02/04/21 02/04/22  Runell Gess, MD  baclofen (LIORESAL) 10 MG tablet Take 1 tablet by mouth 3 times daily. 06/25/21     buprenorphine (BUTRANS) 7.5 MCG/HR Apply 1 patch topically once a week 10/21/20     carvedilol (COREG) 12.5 MG tablet TAKE 1 TABLET BY MOUTH TWO TIMES DAILY 03/05/21 03/05/22  Swinyer, Zachary George, NP  chlorthalidone (HYGROTON) 25 MG tablet Take 1/2 tablet by mouth daily. 03/05/21 03/05/22  Swinyer, Zachary George, NP  Cyanocobalamin (VITAMIN B-12) 2500 MCG SUBL Take 1 tablet by mouth daily.    [provider]  diazepam (VALIUM) 5 MG tablet Insert 1 tablet (5 mg total) into the vagina every 8 hours as needed for pelvic floor spasm. 11/26/21     diclofenac Sodium (VOLTAREN) 1 % GEL Apply 4 grams topically to the skin 4 times a day 10/21/20     dicyclomine (BENTYL) 20 MG tablet Take 1 tablet by mouth 2 times  daily as needed for spasms. NEEDS OFFICE VISIT FOR FURTHER REFILLS 01/12/21   Pyrtle, Carie Caddy, MD  FLUoxetine (PROZAC) 20 MG capsule Take 1 capsule by mouth daily  (total of 60mg  daily) 07/14/20     FLUoxetine (PROZAC) 20 MG capsule Take 1 capsule (20 mg total) by mouth daily. 11/09/21     FLUoxetine (PROZAC) 40 MG capsule Take 1 capsule (40 mg total) by mouth daily. 10/03/17   12/03/17, MD  FLUoxetine (PROZAC) 40 MG capsule Take 1 capsule by mouth once daily 08/31/21     FLUoxetine (PROZAC) 40 MG capsule Take 1 capsule (40 mg total) by mouth daily. 11/09/21     furosemide (LASIX) 20 MG tablet Take 1 tablet (20 mg total) by mouth daily as needed for fluid or edema. 03/05/21   Swinyer, 05/03/21, NP  gabapentin (NEURONTIN) 300 MG capsule Take 300 mg by mouth 4 (four) times daily.    [provider]  gabapentin  (NEURONTIN) 300 MG capsule Take 1 capsule by mouth 4 times a day. 08/19/21     hydrOXYzine (VISTARIL) 25 MG capsule Take 25 mg by mouth as needed. 11/21/17   [provider]  hydrOXYzine (VISTARIL) 25 MG capsule Take 1 capsule by mouth three times a day 08/19/21     omeprazole (PRILOSEC) 20 MG capsule Take 20 mg by mouth daily. 04/18/20   [provider]  omeprazole (PRILOSEC) 20 MG capsule Take 1 capsule by mouth once a day 08/27/21     potassium chloride (KLOR-CON M) 10 MEQ tablet Take 1 tablet (10 mEq total) by mouth daily. 03/05/21   Swinyer, 05/03/21, NP  QUEtiapine (SEROQUEL) 100 MG tablet Take 1 and 1/2  tablets by mouth at bedtime 03/17/21     QUEtiapine (SEROQUEL) 100 MG tablet Take 1 and 1/2 tablets by mouth at bedtime. 08/13/21     QUEtiapine (SEROQUEL) 200 MG tablet Take 1 tablet (200 mg total) by mouth at bedtime. 11/09/21     QUEtiapine (SEROQUEL) 50 MG tablet Take 50 mg by mouth at bedtime. 04/24/20   [provider]  tiZANidine (ZANAFLEX) 4 MG capsule Take 4 mg by mouth 3 (three) times daily as needed for muscle spasms.    [provider]  Turmeric POWD Take 1 each by mouth daily.    [provider]      Allergies    Morphine and related, Septra [sulfamethoxazole-trimethoprim], Sulfa antibiotics, Sulfamethoxazole-trimethoprim, Sulfasalazine, Ultram [tramadol], Ciprofloxacin, Darvocet [propoxyphene n-acetaminophen], Lisinopril, Adhesive [tape], Codeine, Demerol [meperidine], Hydromorphone, and Losartan    Review of Systems   Review of Systems  Physical Exam Updated Vital Signs BP (!) 133/105   Pulse 71   Temp 98.1 F (36.7 C) (Oral)   Resp 18   Ht 5\' 3"  (1.6 m)   Wt 112 kg   SpO2 100%   BMI 43.75 kg/m  Physical Exam Vitals and nursing note reviewed.  HENT:     Head: Atraumatic.  Eyes:     Extraocular Movements: Extraocular movements intact.     Pupils: Pupils are equal, round, and reactive to light.  Chest:     Chest wall: No  tenderness.  Abdominal:     Tenderness: There is no abdominal tenderness.  Musculoskeletal:     Cervical back: Neck supple.  Neurological:     Mental Status: She is alert.     Comments: Face symmetric.  Eye movements intact.  Good grip strength bilaterally.  Subjectively decreased sensation on left upper and  lower extremities.  States it feels if I am touching more lately on that side compared to the other.  Straight leg raise good on the right and potentially little weaker on the left.     ED Results / Procedures / Treatments   Labs (all labs ordered are listed, but only abnormal results are displayed) Labs Reviewed  COMPREHENSIVE METABOLIC PANEL - Abnormal; Notable for the following components:      Result Value   Glucose, Bld 142 (*)    BUN 43 (*)    Creatinine, Ser 4.17 (*)    AST 63 (*)    ALT 83 (*)    GFR, Estimated 12 (*)    All other components within normal limits  CBC WITH DIFFERENTIAL/PLATELET  URINALYSIS, ROUTINE W REFLEX MICROSCOPIC    EKG EKG Interpretation  Date/Time:  Thursday December 24 2021 09:07:58 EDT Ventricular Rate:  76 PR Interval:  194 QRS Duration: 116 QT Interval:  442 QTC Calculation: 497 R Axis:   44 Text Interpretation: Sinus rhythm Nonspecific intraventricular conduction delay Borderline T abnormalities, anterior leads Confirmed by Davonna Belling [phone]) on 12/24/2021 9:10:43 AM  Radiology CT HEAD WO CONTRAST (5MM)  Result Date: 12/24/2021 CLINICAL DATA:  56 year old female with headache, dizziness, left side pain since 0400 hours yesterday. EXAM: CT HEAD WITHOUT CONTRAST TECHNIQUE: Contiguous axial images were obtained from the base of the skull through the vertex without intravenous contrast. RADIATION DOSE REDUCTION: This exam was performed according to the departmental dose-optimization program which includes automated exposure control, adjustment of the mA and/or kV according to patient size and/or use of iterative reconstruction  technique. COMPARISON:  Brain MRI and Head CT 08/12/2016. FINDINGS: Brain: Cerebral volume is stable and within normal limits. No midline shift, ventriculomegaly, mass effect, evidence of mass lesion, intracranial hemorrhage or evidence of cortically based acute infarction. Gray-white matter differentiation is within normal limits throughout the brain. Vascular: No suspicious intracranial vascular hyperdensity. Skull: No acute osseous abnormality identified. Sinuses/Orbits: Right sphenoid sinus is subtotally opacified now with mucoperiosteal thickening. Other Visualized paranasal sinuses and mastoids are clear. Other: No acute orbit or scalp soft tissue finding. IMPRESSION: 1. Stable and normal noncontrast CT appearance of the brain. 2. Chronic right sphenoid sinusitis, new since 2018. Electronically Signed   By: Genevie Ann M.D.   On: 12/24/2021 11:01   DG Chest Portable 1 View  Result Date: 12/24/2021 CLINICAL DATA:  headache, dizziness, twitching, pain on L side since waking at 0400 yesterday weakness EXAM: PORTABLE CHEST 1 VIEW COMPARISON:  None Available. FINDINGS: Normal mediastinum and cardiac silhouette. Normal pulmonary vasculature. No evidence of effusion, infiltrate, or pneumothorax. No acute bony abnormality. IMPRESSION: No acute cardiopulmonary process. Electronically Signed   By: Suzy Bouchard M.D.   On: 12/24/2021 09:08    Procedures Procedures    Medications Ordered in ED Medications - No data to display  ED Course/ Medical Decision Making/ A&P                           Medical Decision Making Amount and/or Complexity of Data Reviewed Labs: ordered. Radiology: ordered.   Patient with a few different complaints.  Headaches vision changes weakness numbness.  Appears more normal left side.  Also states that she had had shaking but that was over the whole body.  Seizure felt less likely.  Differential diagnosis includes stroke, intracranial mass, nonspecific neurologic issues,  complicated migraine.  We will get basic blood work and CT  head and neck with angiography.  Not a TNK candidate due to last normal of 2 days ago.  Creatinine is increased more than 4 now.  Called patient's primary and was able to get old lab work with most recent creatinine of 1.03 on 9/6 of this year.  Patient's postvoid residual was only 87 mL.  Head CT done and reassuring.  No lateralizing findings.  With acute kidney injury and lateralizing numbness/weakness patient will benefit from admission the hospital.  Will discuss with hospitalist.  Not a TNK candidate due to last normal up 2 days ago.        Final Clinical Impression(s) / ED Diagnoses Final diagnoses:  AKI (acute kidney injury) (HCC)  Numbness    Rx / DC Orders ED Discharge Orders     None         Benjiman Core, MD 12/24/21 1129

## 2021-12-24 NOTE — H&P (Signed)
History and Physical    Patient: Carly Delacruz IDP:[id number] DOB: October 19, 1965 DOA: 12/24/2021 DOS: the patient was seen and examined on 12/24/2021 PCP: Irena Reichmann, DO  Patient coming from: Home  Chief Complaint: Weakness and headache.  HPI: Carly Delacruz is a 56 y.o. female with medical history significant of chronic pain, HTN, anxiety, GERD. Presenting with weakness and headache. She reports that she has had a headache intermittently for the last 2 weeks. She has been taking APAP w/o relief. She hasn't had any visual changes, auditory changes, N/V, photophobia with these headaches. She reports that this morning, she woke with tingling all over her body and leg shakes. She tried to get up to go to the bathroom, but felt weak. She fell back to her bed. She didn't pass out or hit her head. She was eventually able to get up with her cane and get to the bathroom. However, she had difficulty returning to bed. She became concerned and called for EMS. She denies any other aggravating or alleviating factors.   Review of Systems: As mentioned in the history of present illness. All other systems reviewed and are negative. Past Medical History:  Diagnosis Date   Allergy    Anemia    Anxiety    Arthritis 12/10/03   Atrophic vaginitis 2009   BV (bacterial vaginosis) 2006   Chronic back pain    Chronic LLQ pain    Chronic post-traumatic stress disorder (PTSD)    Complication of anesthesia    Elevated LFTs    Endometriosis    Fatigue 2012   Gallstones    GERD (gastroesophageal reflux disease)    H/O bladder infections    H/O mumps    H/O varicella    History of candidal vulvovaginitis 03/21/03   HTN (hypertension)    Insomnia    Insomnia secondary to situational depression 06/06/07   Liver mass, right lobe 08/13/04   Nodule   Malaise 2012   Morbid obesity (HCC) 2005   Osteoporosis    Pelvic floor dysfunction    S/P gastric bypass    Severe episode of recurrent major depressive  disorder, without psychotic features (HCC)    Sleep apnea 2005   SUI (stress urinary incontinence, female) 08/12/03   Trichomonas    Vitamin D deficiency    Vulvar lesion 03/21/03   Yeast infection    Past Surgical History:  Procedure Laterality Date   ABDOMINAL HYSTERECTOMY  2001   APPENDECTOMY  2001   CHOLECYSTECTOMY  2011   DILATION AND CURETTAGE OF UTERUS     GASTRIC BYPASS  2007   [facility], Dr. Daphine Deutscher   KNEE ARTHROSCOPY Left    LAPAROTOMY     TONSILLECTOMY     WISDOM TOOTH EXTRACTION     Social History:  reports that she has never smoked. She has never used smokeless tobacco. She reports that she does not drink alcohol and does not use drugs.  Allergies  Allergen Reactions   Morphine And Related Shortness Of Breath and Itching   Septra [Sulfamethoxazole-Trimethoprim] Anaphylaxis and Hives   Sulfa Antibiotics Anaphylaxis, Hives and Swelling    Throat swelling   Sulfamethoxazole-Trimethoprim Anaphylaxis and Hives    Other reaction(s): anaphylaxis   Sulfasalazine Anaphylaxis, Hives, Swelling and Other (See Comments)    Throat swelling   Ultram [Tramadol] Shortness Of Breath, Anxiety and Other (See Comments)    "panic attacks"    Ciprofloxacin Other (See Comments)    This medication was given along with  a sulfa. Reaction is unknown at this time. Must list due to possible allergy.   Darvocet [Propoxyphene N-Acetaminophen] Itching   Lisinopril     Other reaction(s): cough   Adhesive [Tape] Rash   Codeine Itching and Other (See Comments)    No change when benadryl is given   Demerol [Meperidine] Nausea Only   Hydromorphone Itching and Other (See Comments)    No change when benadryl is given   Losartan Palpitations    Family History  Problem Relation Age of Onset   Diabetes Paternal Grandmother    Leukemia Maternal Grandmother    Arthritis Mother    High blood pressure Mother    Heart attack Mother    Prostate cancer Father    Cirrhosis Father     Colitis Sister    Hypertension Sister    Deep vein thrombosis Sister    Kidney disease Brother    Obstructive Sleep Apnea Brother    Colon polyps Neg Hx    Pancreatic cancer Neg Hx    Esophageal cancer Neg Hx    Stomach cancer Neg Hx     Prior to Admission medications   Medication Sig Start Date End Date Taking? Authorizing Provider  amLODipine (NORVASC) 5 MG tablet Take 1 tablet by mouth daily. 03/05/21 03/05/22  Swinyer, Zachary George, NP  atorvastatin (LIPITOR) 80 MG tablet TAKE 1 TABLET BY MOUTH ONCE DAILY 02/04/21 02/04/22  Runell Gess, MD  baclofen (LIORESAL) 10 MG tablet Take 1 tablet by mouth 3 times daily. 06/25/21     buprenorphine (BUTRANS) 7.5 MCG/HR Apply 1 patch topically once a week 10/21/20     carvedilol (COREG) 12.5 MG tablet TAKE 1 TABLET BY MOUTH TWO TIMES DAILY 03/05/21 03/05/22  Swinyer, Zachary George, NP  chlorthalidone (HYGROTON) 25 MG tablet Take 1/2 tablet by mouth daily. 03/05/21 03/05/22  Swinyer, Zachary George, NP  Cyanocobalamin (VITAMIN B-12) 2500 MCG SUBL Take 1 tablet by mouth daily.    [provider]  diazepam (VALIUM) 5 MG tablet Insert 1 tablet (5 mg total) into the vagina every 8 hours as needed for pelvic floor spasm. 11/26/21     diclofenac Sodium (VOLTAREN) 1 % GEL Apply 4 grams topically to the skin 4 times a day 10/21/20     dicyclomine (BENTYL) 20 MG tablet Take 1 tablet by mouth 2 times daily as needed for spasms. NEEDS OFFICE VISIT FOR FURTHER REFILLS 01/12/21   Pyrtle, Carie Caddy, MD  FLUoxetine (PROZAC) 20 MG capsule Take 1 capsule by mouth daily  (total of 60mg  daily) 07/14/20     FLUoxetine (PROZAC) 20 MG capsule Take 1 capsule (20 mg total) by mouth daily. 11/09/21     FLUoxetine (PROZAC) 40 MG capsule Take 1 capsule (40 mg total) by mouth daily. 10/03/17   12/03/17, MD  FLUoxetine (PROZAC) 40 MG capsule Take 1 capsule by mouth once daily 08/31/21     FLUoxetine (PROZAC) 40 MG capsule Take 1 capsule (40 mg total) by mouth daily. 11/09/21      furosemide (LASIX) 20 MG tablet Take 1 tablet (20 mg total) by mouth daily as needed for fluid or edema. 03/05/21   Swinyer, 05/03/21, NP  gabapentin (NEURONTIN) 300 MG capsule Take 300 mg by mouth 4 (four) times daily.    [provider]  gabapentin (NEURONTIN) 300 MG capsule Take 1 capsule by mouth 4 times a day. 08/19/21     hydrOXYzine (VISTARIL) 25 MG capsule Take 25 mg by mouth as  needed. 11/21/17   [provider]  hydrOXYzine (VISTARIL) 25 MG capsule Take 1 capsule by mouth three times a day 08/19/21     omeprazole (PRILOSEC) 20 MG capsule Take 20 mg by mouth daily. 04/18/20   [provider]  omeprazole (PRILOSEC) 20 MG capsule Take 1 capsule by mouth once a day 08/27/21     potassium chloride (KLOR-CON M) 10 MEQ tablet Take 1 tablet (10 mEq total) by mouth daily. 03/05/21   Swinyer, Lanice Schwab, NP  QUEtiapine (SEROQUEL) 100 MG tablet Take 1 and 1/2  tablets by mouth at bedtime 03/17/21     QUEtiapine (SEROQUEL) 100 MG tablet Take 1 and 1/2 tablets by mouth at bedtime. 08/13/21     QUEtiapine (SEROQUEL) 200 MG tablet Take 1 tablet (200 mg total) by mouth at bedtime. 11/09/21     QUEtiapine (SEROQUEL) 50 MG tablet Take 50 mg by mouth at bedtime. 04/24/20   [provider]  tiZANidine (ZANAFLEX) 4 MG capsule Take 4 mg by mouth 3 (three) times daily as needed for muscle spasms.    [provider]  Turmeric POWD Take 1 each by mouth daily.    [provider]    Physical Exam: Vitals:   12/24/21 0900 12/24/21 1000 12/24/21 1100 12/24/21 1130  BP: (!) 120/100 (!) 133/112 (!) 133/105 131/89  Pulse: 78 79 71 74  Resp: (!) 25 (!) 23 18 19   Temp:    98.2 F (36.8 C)  TempSrc:    Oral  SpO2: 99% 99% 100% 100%  Weight:      Height:       General: 56 y.o. female resting in bed in NAD Eyes: PERRL, normal sclera ENMT: Nares patent w/o discharge, orophaynx clear, dentition normal, ears w/o discharge/lesions/ulcers Neck: Supple, trachea  midline Cardiovascular: RRR, +S1, S2, no m/g/r, equal pulses throughout Respiratory: CTABL, no w/r/r, normal WOB GI: BS+, NDNT, no masses noted, no organomegaly noted MSK: No e/c/c Neuro: A&O x 3, no focal deficits; good strength throughout Psyc: Appropriate interaction and affect, calm/cooperative  Data Reviewed:  Results for orders placed or performed during the hospital encounter of 12/24/21 (from the past 24 hour(s))  CBC with Differential     Status: None   Collection Time: 12/24/21  8:10 AM  Result Value Ref Range   WBC 10.3 4.0 - 10.5 K/uL   RBC 4.93 3.87 - 5.11 MIL/uL   Hemoglobin 13.3 12.0 - 15.0 g/dL   HCT 42.2 36.0 - 46.0 %   MCV 85.6 80.0 - 100.0 fL   MCH 27.0 26.0 - 34.0 pg   MCHC 31.5 30.0 - 36.0 g/dL   RDW 15.0 11.5 - 15.5 %   Platelets 256 150 - 400 K/uL   nRBC 0.0 0.0 - 0.2 %   Neutrophils Relative % 69 %   Neutro Abs 7.1 1.7 - 7.7 K/uL   Lymphocytes Relative 23 %   Lymphs Abs 2.3 0.7 - 4.0 K/uL   Monocytes Relative 8 %   Monocytes Absolute 0.8 0.1 - 1.0 K/uL   Eosinophils Relative 0 %   Eosinophils Absolute 0.0 0.0 - 0.5 K/uL   Basophils Relative 0 %   Basophils Absolute 0.0 0.0 - 0.1 K/uL   Immature Granulocytes 0 %   Abs Immature Granulocytes 0.03 0.00 - 0.07 K/uL  Comprehensive metabolic panel     Status: Abnormal   Collection Time: 12/24/21  8:10 AM  Result Value Ref Range   Sodium 138 135 - 145 mmol/L  Potassium 3.5 3.5 - 5.1 mmol/L   Chloride 100 98 - 111 mmol/L   CO2 27 22 - 32 mmol/L   Glucose, Bld 142 (H) 70 - 99 mg/dL   BUN 43 (H) 6 - 20 mg/dL   Creatinine, Ser 0.24 (H) 0.44 - 1.00 mg/dL   Calcium 9.8 8.9 - 09.7 mg/dL   Total Protein 8.0 6.5 - 8.1 g/dL   Albumin 4.1 3.5 - 5.0 g/dL   AST 63 (H) 15 - 41 U/L   ALT 83 (H) 0 - 44 U/L   Alkaline Phosphatase 101 38 - 126 U/L   Total Bilirubin 0.5 0.3 - 1.2 mg/dL   GFR, Estimated 12 (L) >60 mL/min   Anion gap 11 5 - 15   CXR: No acute cardiopulmonary process.  CTH 1. Stable and normal  noncontrast CT appearance of the brain. 2. Chronic right sphenoid sinusitis, new since 2018.  Assessment and Plan: AKI     - place in obs, tele     - check renal US     - fluids, watch nephrotoxins  Headache Weakness     - CTH negative     - MRI brain pending     - inconsistent neuro exam when compared with EDP exam     - PT/OT evaluation  PTSD Anxiety     - continue home regimen when confirmed  GERD     - PPI  HTN     - hold home regimen for right now, pending MRI results  Chronic pain     - continue home regimen when confirmed  HLD     - continue home regimen when confirmed  Elevated LFTs     - check hepatitis panel     - trend  Advance Care Planning:   Code Status: FULL  Consults: None  Family Communication: None at bedside  Severity of Illness: The appropriate patient status for this patient is INPATIENT. Inpatient status is judged to be reasonable and necessary in order to provide the required intensity of service to ensure the patient's safety. The patient's presenting symptoms, physical exam findings, and initial radiographic and laboratory data in the context of their chronic comorbidities is felt to place them at high risk for further clinical deterioration. Furthermore, it is not anticipated that the patient will be medically stable for discharge from the hospital within 2 midnights of admission.   * I certify that at the point of admission it is my clinical judgment that the patient will require inpatient hospital care spanning beyond 2 midnights from the point of admission due to high intensity of service, high risk for further deterioration and high frequency of surveillance required.*  Author: Teddy Spike, DO 12/24/2021 11:50 AM  For on call review [email or web address].

## 2021-12-24 NOTE — ED Notes (Signed)
PA Rona Ravens notified of abnormalities in initial NIH assessment, with [facility] Tuesday 10/24 around 2300. No code stroke activated by this RN due to patient's symptom onset.

## 2021-12-24 NOTE — ED Notes (Signed)
Pt reports claustrophobia, reports that she needs premedication for MRI. MD made aware.

## 2021-12-24 NOTE — ED Notes (Signed)
Pt transported to MRI via WC.

## 2021-12-24 NOTE — ED Triage Notes (Signed)
Pt BIB [facility] from home C/O headache, dizziness, twitching, pain on L side since waking at 0400 yesterday.

## 2021-12-25 ENCOUNTER — Other Ambulatory Visit: Payer: Self-pay

## 2021-12-25 DIAGNOSIS — T428X5A Adverse effect of antiparkinsonism drugs and other central muscle-tone depressants, initial encounter: Secondary | ICD-10-CM | POA: Diagnosis present

## 2021-12-25 DIAGNOSIS — M6281 Muscle weakness (generalized): Secondary | ICD-10-CM | POA: Diagnosis present

## 2021-12-25 DIAGNOSIS — Z8261 Family history of arthritis: Secondary | ICD-10-CM | POA: Diagnosis not present

## 2021-12-25 DIAGNOSIS — Z8249 Family history of ischemic heart disease and other diseases of the circulatory system: Secondary | ICD-10-CM | POA: Diagnosis not present

## 2021-12-25 DIAGNOSIS — R7989 Other specified abnormal findings of blood chemistry: Secondary | ICD-10-CM | POA: Diagnosis present

## 2021-12-25 DIAGNOSIS — F32A Depression, unspecified: Secondary | ICD-10-CM | POA: Diagnosis present

## 2021-12-25 DIAGNOSIS — Z806 Family history of leukemia: Secondary | ICD-10-CM | POA: Diagnosis not present

## 2021-12-25 DIAGNOSIS — Z833 Family history of diabetes mellitus: Secondary | ICD-10-CM | POA: Diagnosis not present

## 2021-12-25 DIAGNOSIS — G629 Polyneuropathy, unspecified: Secondary | ICD-10-CM

## 2021-12-25 DIAGNOSIS — T426X5A Adverse effect of other antiepileptic and sedative-hypnotic drugs, initial encounter: Secondary | ICD-10-CM | POA: Diagnosis present

## 2021-12-25 DIAGNOSIS — F4310 Post-traumatic stress disorder, unspecified: Secondary | ICD-10-CM | POA: Diagnosis present

## 2021-12-25 DIAGNOSIS — Z6841 Body Mass Index (BMI) 40.0 and over, adult: Secondary | ICD-10-CM | POA: Diagnosis not present

## 2021-12-25 DIAGNOSIS — J0100 Acute maxillary sinusitis, unspecified: Secondary | ICD-10-CM | POA: Diagnosis present

## 2021-12-25 DIAGNOSIS — N179 Acute kidney failure, unspecified: Secondary | ICD-10-CM | POA: Diagnosis present

## 2021-12-25 DIAGNOSIS — K219 Gastro-esophageal reflux disease without esophagitis: Secondary | ICD-10-CM | POA: Diagnosis present

## 2021-12-25 DIAGNOSIS — I10 Essential (primary) hypertension: Secondary | ICD-10-CM

## 2021-12-25 DIAGNOSIS — E785 Hyperlipidemia, unspecified: Secondary | ICD-10-CM | POA: Diagnosis present

## 2021-12-25 DIAGNOSIS — M549 Dorsalgia, unspecified: Secondary | ICD-10-CM | POA: Diagnosis present

## 2021-12-25 DIAGNOSIS — I959 Hypotension, unspecified: Secondary | ICD-10-CM | POA: Diagnosis not present

## 2021-12-25 DIAGNOSIS — Z79899 Other long term (current) drug therapy: Secondary | ICD-10-CM | POA: Diagnosis not present

## 2021-12-25 DIAGNOSIS — R251 Tremor, unspecified: Secondary | ICD-10-CM | POA: Diagnosis present

## 2021-12-25 DIAGNOSIS — G8929 Other chronic pain: Secondary | ICD-10-CM | POA: Diagnosis present

## 2021-12-25 DIAGNOSIS — E876 Hypokalemia: Secondary | ICD-10-CM | POA: Diagnosis present

## 2021-12-25 LAB — URINALYSIS, ROUTINE W REFLEX MICROSCOPIC
Bilirubin Urine: NEGATIVE
Glucose, UA: 50 mg/dL — AB
Hgb urine dipstick: NEGATIVE
Ketones, ur: NEGATIVE mg/dL
Leukocytes,Ua: NEGATIVE
Nitrite: NEGATIVE
Protein, ur: 100 mg/dL — AB
Specific Gravity, Urine: 1.02 (ref 1.005–1.030)
pH: 5 (ref 5.0–8.0)

## 2021-12-25 LAB — CBC
HCT: 38.1 % (ref 36.0–46.0)
Hemoglobin: 12 g/dL (ref 12.0–15.0)
MCH: 27.1 pg (ref 26.0–34.0)
MCHC: 31.5 g/dL (ref 30.0–36.0)
MCV: 86 fL (ref 80.0–100.0)
Platelets: 235 10*3/uL (ref 150–400)
RBC: 4.43 MIL/uL (ref 3.87–5.11)
RDW: 15 % (ref 11.5–15.5)
WBC: 7.5 10*3/uL (ref 4.0–10.5)
nRBC: 0 % (ref 0.0–0.2)

## 2021-12-25 LAB — HEPATITIS PANEL, ACUTE
HCV Ab: NONREACTIVE
Hep A IgM: NONREACTIVE
Hep B C IgM: NONREACTIVE
Hepatitis B Surface Ag: NONREACTIVE

## 2021-12-25 LAB — COMPREHENSIVE METABOLIC PANEL
ALT: 69 U/L — ABNORMAL HIGH (ref 0–44)
AST: 55 U/L — ABNORMAL HIGH (ref 15–41)
Albumin: 3.3 g/dL — ABNORMAL LOW (ref 3.5–5.0)
Alkaline Phosphatase: 88 U/L (ref 38–126)
Anion gap: 8 (ref 5–15)
BUN: 33 mg/dL — ABNORMAL HIGH (ref 6–20)
CO2: 29 mmol/L (ref 22–32)
Calcium: 8.7 mg/dL — ABNORMAL LOW (ref 8.9–10.3)
Chloride: 101 mmol/L (ref 98–111)
Creatinine, Ser: 2.71 mg/dL — ABNORMAL HIGH (ref 0.44–1.00)
GFR, Estimated: 20 mL/min — ABNORMAL LOW (ref >60)
Glucose, Bld: 111 mg/dL — ABNORMAL HIGH (ref 70–99)
Potassium: 3.4 mmol/L — ABNORMAL LOW (ref 3.5–5.1)
Sodium: 138 mmol/L (ref 135–145)
Total Bilirubin: 0.5 mg/dL (ref 0.3–1.2)
Total Protein: 6.6 g/dL (ref 6.5–8.1)

## 2021-12-25 MED ORDER — SODIUM CHLORIDE 0.9 % IV SOLN
1.0000 g | INTRAVENOUS | Status: DC
  Administered 2021-12-25 – 2021-12-26 (×2): 1 g via INTRAVENOUS
  Filled 2021-12-25 (×2): qty 10

## 2021-12-25 MED ORDER — POTASSIUM CHLORIDE CRYS ER 20 MEQ PO TBCR
40.0000 meq | EXTENDED_RELEASE_TABLET | Freq: Once | ORAL | Status: AC
  Administered 2021-12-25: 40 meq via ORAL
  Filled 2021-12-25: qty 2

## 2021-12-25 MED ORDER — FLUTICASONE PROPIONATE 50 MCG/ACT NA SUSP
1.0000 | Freq: Every day | NASAL | Status: DC
  Administered 2021-12-26: 1 via NASAL
  Filled 2021-12-25: qty 16

## 2021-12-25 MED ORDER — ADULT MULTIVITAMIN W/MINERALS CH
1.0000 | ORAL_TABLET | Freq: Two times a day (BID) | ORAL | Status: DC
  Administered 2021-12-25 – 2021-12-26 (×2): 1 via ORAL
  Filled 2021-12-25 (×2): qty 1

## 2021-12-25 MED ORDER — SALINE SPRAY 0.65 % NA SOLN: 1.0000 | NASAL | Status: DC | PRN

## 2021-12-25 MED ORDER — ENSURE ENLIVE PO LIQD
237.0000 mL | Freq: Two times a day (BID) | ORAL | Status: DC
  Administered 2021-12-25 – 2021-12-26 (×3): 237 mL via ORAL

## 2021-12-25 MED ORDER — DIAZEPAM 5 MG PO TABS
5.0000 mg | ORAL_TABLET | Freq: Three times a day (TID) | ORAL | Status: DC | PRN
  Administered 2021-12-26: 5 mg via ORAL
  Filled 2021-12-25: qty 1

## 2021-12-25 MED ORDER — SODIUM CHLORIDE 0.9 % IV BOLUS
1000.0000 mL | Freq: Once | INTRAVENOUS | Status: AC
  Administered 2021-12-25: 1000 mL via INTRAVENOUS

## 2021-12-25 NOTE — Progress Notes (Signed)
OT Cancellation Note  Patient Details Name: PRARTHANA PARLIN MRN: [id number] DOB: 1965/05/28   Cancelled Treatment:    Reason Eval/Treat Not Completed: Medical issues which prohibited therapy. Patient's nurse asked for OT to hold therapy, due to pt having low blood pressure. Will follow-up to attempt OT eval another day.    Leota Sauers, OTR/L 12/25/2021, 2:22 PM

## 2021-12-25 NOTE — Evaluation (Signed)
Physical Therapy Evaluation Patient Details Name: Carly Delacruz MRN: [id number] DOB: 1965/05/02 Today's Date: 12/25/2021  History of Present Illness  56 y.o. female with medical history significant of chronic pain, HTN, anxiety, GERD, gastric bypass 2007, PTSD. Presenting with weakness and headache. Dx of AKI.  Clinical Impression  Pt admitted with above diagnosis. Pt ambulated 110' with RW without loss of balance, she initially attempted walking with her cane (which she uses at baseline) and was unsteady with that. I advised pt to use her rollator at home initially, until she returns to baseline with balance.  Pt currently with functional limitations due to the deficits listed below (see PT Problem List). Pt will benefit from skilled PT to increase their independence and safety with mobility to allow discharge to the venue listed below.          Recommendations for follow up therapy are one component of a multi-disciplinary discharge planning process, led by the attending physician.  Recommendations may be updated based on patient status, additional functional criteria and insurance authorization.  Follow Up Recommendations Home health PT      Assistance Recommended at Discharge Set up Supervision/Assistance  Patient can return home with the following  Assistance with cooking/housework;Help with stairs or ramp for entrance    Equipment Recommendations None recommended by PT  Recommendations for Other Services       Functional Status Assessment Patient has had a recent decline in their functional status and demonstrates the ability to make significant improvements in function in a reasonable and predictable amount of time.     Precautions / Restrictions Precautions Precautions: Fall Restrictions Weight Bearing Restrictions: No      Mobility  Bed Mobility Overal bed mobility: Modified Independent             General bed mobility comments: HOB up, increased time     Transfers Overall transfer level: Needs assistance Equipment used: Rolling walker (2 wheels) Transfers: Sit to/from Stand Sit to Stand: Supervision           General transfer comment: supervision for safety, mildly unsteady upon standing but able to self correct    Ambulation/Gait Ambulation/Gait assistance: Supervision Gait Distance (Feet): 110 Feet Assistive device: Rolling walker (2 wheels) Gait Pattern/deviations: Step-through pattern, Decreased stride length, Trunk flexed Gait velocity: decr     General Gait Details: steady, no loss of balance; initially tried walking with SPC, which is her baseline, but was unsteady so switched to RW.   Stairs            Wheelchair Mobility    Modified Rankin (Stroke Patients Only)       Balance Overall balance assessment: Needs assistance   Sitting balance-Leahy Scale: Good     Standing balance support: During functional activity, Reliant on assistive device for balance Standing balance-Leahy Scale: Fair                               Pertinent Vitals/Pain Pain Assessment Pain Assessment: 0-10 Pain Score: 6  Pain Location: headache Pain Descriptors / Indicators: Aching Pain Intervention(s): Limited activity within patient's tolerance, Monitored during session, Patient requesting pain meds-RN notified    Home Living Family/patient expects to be discharged to:: Private residence Living Arrangements: Parent Available Help at Discharge: Family Type of Home: House Home Access: Stairs to enter Entrance Stairs-Rails: Doctor, general practice of Steps: 2   Home Layout: One level Home Equipment: Gilmer Mor - single point;Rollator (  4 wheels);BSC/3in1;Shower seat      Prior Function               Mobility Comments: walks with SPC, no falls in past 6 months       Hand Dominance        Extremity/Trunk Assessment   Upper Extremity Assessment Upper Extremity Assessment: Defer to OT  evaluation    Lower Extremity Assessment Lower Extremity Assessment: Overall WFL for tasks assessed    Cervical / Trunk Assessment Cervical / Trunk Assessment: Normal  Communication   Communication: No difficulties  Cognition Arousal/Alertness: Awake/alert Behavior During Therapy: WFL for tasks assessed/performed Overall Cognitive Status: Within Functional Limits for tasks assessed                                          General Comments      Exercises     Assessment/Plan    PT Assessment Patient needs continued PT services  PT Problem List Decreased activity tolerance;Decreased balance       PT Treatment Interventions Gait training;Therapeutic exercise;Functional mobility training;Therapeutic activities    PT Goals (Current goals can be found in the Care Plan section)  Acute Rehab PT Goals Patient Stated Goal: get back to water exercise class and walking PT Goal Formulation: With patient Time For Goal Achievement: 01/08/22 Potential to Achieve Goals: Good    Frequency Min 3X/week     Co-evaluation               AM-PAC PT "6 Clicks" Mobility  Outcome Measure Help needed turning from your back to your side while in a flat bed without using bedrails?: None Help needed moving from lying on your back to sitting on the side of a flat bed without using bedrails?: A Little Help needed moving to and from a bed to a chair (including a wheelchair)?: A Little Help needed standing up from a chair using your arms (e.g., wheelchair or bedside chair)?: None Help needed to walk in hospital room?: None Help needed climbing 3-5 steps with a railing? : A Little 6 Click Score: 21    End of Session Equipment Utilized During Treatment: Gait belt Activity Tolerance: Patient tolerated treatment well Patient left: in chair;with call bell/phone within reach;with nursing/sitter in room Nurse Communication: Mobility status PT Visit Diagnosis: Unsteadiness on  feet (R26.81);Pain    Time: 8315-1761 PT Time Calculation (min) (ACUTE ONLY): 29 min   Charges:   PT Evaluation $PT Eval Moderate Complexity: 1 Mod PT Treatments $Gait Training: 8-22 mins       Blondell Reveal Kistler PT 12/25/2021  Acute Rehabilitation Services  Office [phone]

## 2021-12-25 NOTE — Progress Notes (Signed)
Pt states she wants to hold off on a bladder scan and in and out cath until she feels pressure in her abdomen. Pt educated. Pt states she will try to use the bathroom first, and if unsuccessful we will bladder scan and in and out cath her.

## 2021-12-25 NOTE — Progress Notes (Signed)
Initial Nutrition Assessment  DOCUMENTATION CODES:   Obesity unspecified  INTERVENTION:   -Ensure Plus High Protein po BID, each supplement provides 350 kcal and 20 grams of protein.   -Multivitamin with minerals BID given h/o gastric bypass  -Check Vitamin D labs given h/o gastric bypass and pt with history of deficiency  -Will place vitamin recommendations in AVS  NUTRITION DIAGNOSIS:   Inadequate oral intake related to  (recent teeth removal, difficulty chewing) as evidenced by per patient/family report.  GOAL:   Patient will meet greater than or equal to 90% of their needs  MONITOR:   PO intake, Supplement acceptance, Labs, Weight trends, I & O's  REASON FOR ASSESSMENT:   Malnutrition Screening Tool    ASSESSMENT:   56 y.o. female with medical history significant of chronic pain, HTN, anxiety, GERD. Presenting with weakness and headache. She reports that she has had a headache intermittently for the last 2 weeks.  Patient in room, states she ate most of a [place] sandwich with fruit. Had a whole fresh fruit plate at bedside, she plans to snack on it later.  Pt states prior to her upper teeth removal on 8/25, she was very active, [facility] and [facility] every morning. She would usually miss breakfast d/t this but was drinking a Boost or protein bar in place of breakfast. Later in the day, she would eat fruits, boiled eggs and peanut butter with crackers.  Since her teeth removal, she has been eating less and has to consume softer foods since she has difficulty chewing. States she has no problem with fresh fruit or well cooked meats.   Pt reports history of gastric bypass ~10 years ago. Pt takes a daily MVI, calcium and B-12. Was told in the past she was Vitamin D deficient but wasn't sure about dosage to take. RD to check Vitamin D labs to see if supplementation is necessary. Pt agreed to this. Pt also takes biotin for her hair and nails.  States  she was having tingling in her arms and legs but this went away following admission. Denies any other signs of micronutrient deficiency.  Per weight records, pt's weight has decreased since January 2023. On 1/5, weighed 265 lbs. Current weight: 247 lbs. Insignificant change for time frame.  Medications reviewed.  Labs reviewed: Low K   NUTRITION - FOCUSED PHYSICAL EXAM:  No depletions noted.  Diet Order:   Diet Order             Diet Heart Room service appropriate? Yes; Fluid consistency: Thin  Diet effective now                   EDUCATION NEEDS:   Education needs have been addressed  Skin:  Skin Assessment: Reviewed RN Assessment  Last BM:  PTA  Height:   Ht Readings from Last 1 Encounters:  12/24/21 5\' 3"  (1.6 m)    Weight:   Wt Readings from Last 1 Encounters:  12/24/21 112 kg    BMI:  Body mass index is 43.75 kg/m.  Estimated Nutritional Needs:   Kcal:  1500-1700  Protein:  75-90g  Fluid:  1.7L/day  Clayton Bibles, MS, RD, LDN Inpatient Clinical Dietitian Contact information available via Amion

## 2021-12-25 NOTE — Hospital Course (Signed)
Carly Delacruz is a 56 y.o. female with a history of chronic pain, hypertension, anxiety, GERD, migraine headache, pelvic floor dysfunction requiring intermittent catheterization. Patient presented secondary to weakness and headache and found to have an AKI. Workup for headache unremarkable. IV fluids initiated for AKI.

## 2021-12-25 NOTE — Discharge Instructions (Signed)
   Snack Ideas It is important to choose foods that are high in protein and fiber to boost energy and keep you feeling full.   Protein: Aim for 10-15+ grams per snack Fiber: Aim for 3+ grams per snack  Use the list below to mix and match your ideal snack!  Protein (choose 1 serving):                                [place] Yogurt (Low Sugar) Cheese Stick  Cottage Cheese Nuts/Seeds Peanut Butter  Deli-Meat Hard-Boiled Egg Chicken/Tuna Salad  Hummus or Black Bean Dip Edamame      Fiber (choose 1 serving): Fruit  Veggies (Carrots, Broccoli, Celery, Bell Peppers, Cucumbers, Tomatoes, etc.) Whole Wheat Crackers Whole Wheat Bread  Whole Wheat Pita  Granola Rice Cake Oatmeal  Some ideas might include: Cheese stick and grapes [place] yogurt with granola Peanut butter with celery Hummus with carrots and cucumbers Almonds and an apple [place] and cheese slices on whole wheat crackers   Vitamin Recommendations for after gastric bypass: -Multi-vitamin -preferably bariatric specific -500 mg Calcium citrate or carbonate three times daily -Take your calcium supplement separate from your multivitamins with iron or any iron supplement by 2+ hours -350-500 mcg Vitamin B-12 daily

## 2021-12-25 NOTE — Progress Notes (Addendum)
PROGRESS NOTE    Carly Delacruz  EGB:[id number] DOB: 03-31-1965 DOA: 12/24/2021 PCP: Irena Reichmann, DO   Brief Narrative: Carly Delacruz is a 56 y.o. female with a history of chronic pain, hypertension, anxiety, GERD, migraine headache, pelvic floor dysfunction requiring intermittent catheterization. Patient presented secondary to weakness and headache and found to have an AKI. Workup for headache unremarkable. IV fluids initiated for AKI.   Assessment and Plan:  AKI Appears to be prerenal in nature. No recent baseline available. Creatinine of 4.17 on admission. Complicated by patient's need for intermittent in/out catheterization. Renal ultrasound without hydronephrosis. IV fluids initiated. Creatinine down to 2.71 on BMP today -Continue IV fluids -BMP in AM  Headache Improved but still present. Bilateral. CT and MRI brain without acute etiology. Patient with associated left maxillary sinus pressure and post nasal drip. No URI symptoms. Will treat as possible acute left maxillary sinusitis.  Acute maxillary sinusitis Left side. Patient with symptoms spanning over one week. -Ceftriaxone IV and likely transition to Cefdinir and treat for 10 days pending improvement of renal function  Weakness Present on admission. Also with some tremor which appears resolved. Symptoms might be secondary to gabapentin toxicity in setting of AKI. Symptoms are improved today.  Depression Anxiety -Continue home Prozac  Pelvic floor dysfunction -In/out urinary catheterization as needed -Continue Valium vaginally as needed  Primary hypertension -Continue home amlodipine and Coreg -Chlorthalidone held secondary to AKI and may need to be discontinued on discharge  GERD -Continue Protonix  Chronic pain Patient is on buprenorphine as an outpatient.  Hypokalemia -KDur 40 meq x1  Elevated AST/ALT Unclear etiology. Asymptomatic. AST/ALT of 63/83 respectively on admission and are trending  down. Hepatitis panel obtained on admission and was negative.  Morbid obesity Estimated body mass index is 43.75 kg/m as calculated from the following:   Height as of this encounter: 5\' 3"  (1.6 m).   Weight as of this encounter: 112 kg.  DVT prophylaxis: SCDs Code Status:   Code Status: Full Code Family Communication: None at bedside Disposition Plan: Discharge home tomorrow pending improved creatinine and discontinuation of IV fluids   Consultants:  None  Procedures:  None  Antimicrobials: Ceftriaxone    Subjective: Patient reports continued headache that is throbbing on right and somewhat persistent on left. Associated maxillary sinus tenderness which she has had for over one week. No URI symptoms but does have some post-nasal drip.  Objective: BP 117/89 (BP Location: Left Arm)   Pulse 71   Temp 97.7 F (36.5 C) (Oral)   Resp 17   Ht 5\' 3"  (1.6 m)   Wt 112 kg   SpO2 98%   BMI 43.75 kg/m   Examination:  General exam: Appears calm and comfortable HEENT: Eyes with minimal conjunctival injection. Normal EOM. No proptosis. Swelling noted. Some tenderness of left maxillary sinus.  Respiratory system: Clear to auscultation. Respiratory effort normal. Cardiovascular system: S1 & S2 heard, RRR. No murmurs, rubs, gallops or clicks. Gastrointestinal system: Abdomen is nondistended, soft and nontender. No organomegaly or masses felt. Normal bowel sounds heard. Central nervous system: Alert and oriented. No focal neurological deficits. Musculoskeletal: No edema. No calf tenderness Psychiatry: Judgement and insight appear normal. Mood & affect appropriate.    Data Reviewed: I have personally reviewed following labs and imaging studies  CBC Lab Results  Component Value Date   WBC 7.5 12/25/2021   RBC 4.43 12/25/2021   HGB 12.0 12/25/2021   HCT 38.1 12/25/2021   MCV 86.0 12/25/2021  MCH 27.1 12/25/2021   PLT 235 12/25/2021   MCHC 31.5 12/25/2021   RDW 15.0  12/25/2021   LYMPHSABS 2.3 12/24/2021   MONOABS 0.8 12/24/2021   EOSABS 0.0 12/24/2021   BASOSABS 0.0 12/24/2021     Last metabolic panel Lab Results  Component Value Date   NA 138 12/25/2021   K 3.4 (L) 12/25/2021   CL 101 12/25/2021   CO2 29 12/25/2021   BUN 33 (H) 12/25/2021   CREATININE 2.71 (H) 12/25/2021   GLUCOSE 111 (H) 12/25/2021   GFRNONAA 20 (L) 12/25/2021   GFRAA 70 03/20/2018   CALCIUM 8.7 (L) 12/25/2021   PHOS 3.2 08/12/2016   PROT 6.6 12/25/2021   ALBUMIN 3.3 (L) 12/25/2021   BILITOT 0.5 12/25/2021   ALKPHOS 88 12/25/2021   AST 55 (H) 12/25/2021   ALT 69 (H) 12/25/2021   ANIONGAP 8 12/25/2021    GFR: Estimated Creatinine Clearance: 27.2 mL/min (A) (by C-G formula based on SCr of 2.71 mg/dL (H)).  No results found for this or any previous visit (from the past 240 hour(s)).    Radiology Studies: US RENAL  Result Date: 12/24/2021 CLINICAL DATA:  Acute kidney injury EXAM: RENAL / URINARY TRACT ULTRASOUND COMPLETE COMPARISON:  09/10/2009 FINDINGS: Right Kidney: Renal measurements: 12.2 x 6.6 x 5.5 cm = volume: 231 mL. Echogenicity within normal limits. No mass or hydronephrosis visualized. Left Kidney: Renal measurements: 10.6 x 5.5 x 4.1 cm = volume: 123 mL. Echogenicity within normal limits. No mass or hydronephrosis visualized. Bladder: Appears normal for degree of bladder distention. Other: None. IMPRESSION: No acute findings.  No hydronephrosis. Electronically Signed   By: Charlett Nose M.D.   On: 12/24/2021 14:17   MR BRAIN WO CONTRAST  Result Date: 12/24/2021 CLINICAL DATA:  Acute neuro deficit rule out stroke. Headache and dizziness EXAM: MRI HEAD WITHOUT CONTRAST MRA HEAD WITHOUT CONTRAST TECHNIQUE: Multiplanar, multi-echo pulse sequences of the brain and surrounding structures were acquired without intravenous contrast. Angiographic images of the Circle of Willis were acquired using MRA technique without intravenous contrast. COMPARISON:  CT head  12/24/2021 FINDINGS: MRI HEAD FINDINGS Brain: No acute infarction, hemorrhage, hydrocephalus, extra-axial collection or mass lesion. Few small white matter hyperintensities. Vascular: Normal arterial flow voids Skull and upper cervical spine: Negative Sinuses/Orbits: Mucosal edema sphenoid sinus and maxillary sinus bilaterally. Negative orbit Other: None MRA HEAD FINDINGS Anterior circulation: Internal carotid artery widely patent. Anterior middle cerebral arteries normal. No stenosis or aneurysm Posterior circulation: Both vertebral arteries patent to the basilar. PICA not visualized. Dominant right AICA and small left AICA noted. Basilar patent. Superior cerebellar and posterior cerebral arteries normal without stenosis or aneurysm Anatomic variants: None IMPRESSION: 1. No acute intracranial abnormality. Mild white matter changes likely due to chronic microvascular ischemia or chronic migraine headache. 2. Negative MRA head. Electronically Signed   By: Marlan Palau M.D.   On: 12/24/2021 13:50   MR ANGIO HEAD WO CONTRAST  Result Date: 12/24/2021 CLINICAL DATA:  Acute neuro deficit rule out stroke. Headache and dizziness EXAM: MRI HEAD WITHOUT CONTRAST MRA HEAD WITHOUT CONTRAST TECHNIQUE: Multiplanar, multi-echo pulse sequences of the brain and surrounding structures were acquired without intravenous contrast. Angiographic images of the Circle of Willis were acquired using MRA technique without intravenous contrast. COMPARISON:  CT head 12/24/2021 FINDINGS: MRI HEAD FINDINGS Brain: No acute infarction, hemorrhage, hydrocephalus, extra-axial collection or mass lesion. Few small white matter hyperintensities. Vascular: Normal arterial flow voids Skull and upper cervical spine: Negative Sinuses/Orbits: Mucosal edema sphenoid sinus  and maxillary sinus bilaterally. Negative orbit Other: None MRA HEAD FINDINGS Anterior circulation: Internal carotid artery widely patent. Anterior middle cerebral arteries normal. No  stenosis or aneurysm Posterior circulation: Both vertebral arteries patent to the basilar. PICA not visualized. Dominant right AICA and small left AICA noted. Basilar patent. Superior cerebellar and posterior cerebral arteries normal without stenosis or aneurysm Anatomic variants: None IMPRESSION: 1. No acute intracranial abnormality. Mild white matter changes likely due to chronic microvascular ischemia or chronic migraine headache. 2. Negative MRA head. Electronically Signed   By: Franchot Gallo M.D.   On: 12/24/2021 13:50   CT HEAD WO CONTRAST (5MM)  Result Date: 12/24/2021 CLINICAL DATA:  56 year old female with headache, dizziness, left side pain since 0400 hours yesterday. EXAM: CT HEAD WITHOUT CONTRAST TECHNIQUE: Contiguous axial images were obtained from the base of the skull through the vertex without intravenous contrast. RADIATION DOSE REDUCTION: This exam was performed according to the departmental dose-optimization program which includes automated exposure control, adjustment of the mA and/or kV according to patient size and/or use of iterative reconstruction technique. COMPARISON:  Brain MRI and Head CT 08/12/2016. FINDINGS: Brain: Cerebral volume is stable and within normal limits. No midline shift, ventriculomegaly, mass effect, evidence of mass lesion, intracranial hemorrhage or evidence of cortically based acute infarction. Gray-white matter differentiation is within normal limits throughout the brain. Vascular: No suspicious intracranial vascular hyperdensity. Skull: No acute osseous abnormality identified. Sinuses/Orbits: Right sphenoid sinus is subtotally opacified now with mucoperiosteal thickening. Other Visualized paranasal sinuses and mastoids are clear. Other: No acute orbit or scalp soft tissue finding. IMPRESSION: 1. Stable and normal noncontrast CT appearance of the brain. 2. Chronic right sphenoid sinusitis, new since 2018. Electronically Signed   By: Genevie Ann M.D.   On: 12/24/2021  11:01   DG Chest Portable 1 View  Result Date: 12/24/2021 CLINICAL DATA:  headache, dizziness, twitching, pain on L side since waking at 0400 yesterday weakness EXAM: PORTABLE CHEST 1 VIEW COMPARISON:  None Available. FINDINGS: Normal mediastinum and cardiac silhouette. Normal pulmonary vasculature. No evidence of effusion, infiltrate, or pneumothorax. No acute bony abnormality. IMPRESSION: No acute cardiopulmonary process. Electronically Signed   By: Suzy Bouchard M.D.   On: 12/24/2021 09:08      LOS: 0 days    Cordelia Poche, MD [facility] 12/25/2021, 9:41 AM   If 7PM-7AM, please contact night-coverage [email or web address]

## 2021-12-26 ENCOUNTER — Other Ambulatory Visit (HOSPITAL_COMMUNITY): Payer: Self-pay

## 2021-12-26 DIAGNOSIS — N179 Acute kidney failure, unspecified: Secondary | ICD-10-CM | POA: Diagnosis not present

## 2021-12-26 LAB — BASIC METABOLIC PANEL
Anion gap: 4 — ABNORMAL LOW (ref 5–15)
BUN: 24 mg/dL — ABNORMAL HIGH (ref 6–20)
CO2: 29 mmol/L (ref 22–32)
Calcium: 7.8 mg/dL — ABNORMAL LOW (ref 8.9–10.3)
Chloride: 108 mmol/L (ref 98–111)
Creatinine, Ser: 1.86 mg/dL — ABNORMAL HIGH (ref 0.44–1.00)
GFR, Estimated: 31 mL/min — ABNORMAL LOW (ref >60)
Glucose, Bld: 89 mg/dL (ref 70–99)
Potassium: 3.5 mmol/L (ref 3.5–5.1)
Sodium: 141 mmol/L (ref 135–145)

## 2021-12-26 LAB — VITAMIN D 25 HYDROXY (VIT D DEFICIENCY, FRACTURES): Vit D, 25-Hydroxy: 44.43 ng/mL (ref 30–100)

## 2021-12-26 MED ORDER — CEFDINIR 300 MG PO CAPS
300.0000 mg | ORAL_CAPSULE | Freq: Two times a day (BID) | ORAL | 0 refills | Status: AC
  Filled 2021-12-26: qty 16, 8d supply, fill #0

## 2021-12-26 MED ORDER — BACLOFEN 10 MG PO TABS: 5.0000 mg | ORAL_TABLET | Freq: Three times a day (TID) | ORAL | 1 refills | Status: DC

## 2021-12-26 MED ORDER — SALINE SPRAY 0.65 % NA SOLN: 1.0000 | NASAL | 0 refills | Status: AC | PRN

## 2021-12-26 MED ORDER — GABAPENTIN 300 MG PO CAPS: 300.0000 mg | ORAL_CAPSULE | Freq: Three times a day (TID) | ORAL | 0 refills | Status: DC

## 2021-12-26 MED ORDER — FLUTICASONE PROPIONATE 50 MCG/ACT NA SUSP
1.0000 | Freq: Every day | NASAL | 2 refills | Status: DC
  Filled 2021-12-26: qty 16, 60d supply, fill #0

## 2021-12-26 NOTE — Progress Notes (Signed)
Mobility Specialist - Progress Note   12/26/21 1003  Mobility  Activity Ambulated with assistance in hallway  Level of Assistance Standby assist, set-up cues, supervision of patient - no hands on  Assistive Device Front wheel walker  Distance Ambulated (ft) 250 ft  Activity Response Tolerated well  Mobility Referral Yes  $Mobility charge 1 Mobility   Pt received in bed and agreed to mobility, some c/o dizziness during and nearing EOS. Pt back to chair with all needs met.   Roderick Pee Mobility Specialist

## 2021-12-26 NOTE — Discharge Summary (Signed)
Physician Discharge Summary   Patient: Carly Delacruz MRN: [id number] DOB: December 13, 1965  Admit date:     12/24/2021  Discharge date: 12/26/21  Discharge Physician: Cordelia Poche, MD   PCP: Janie Morning, DO   Recommendations at discharge:  PCP follow-up Repeat BMP in 3-5 days Repeat urinalysis to ensure proteinuria improved/resolved  Discharge Diagnoses: Principal Problem:   AKI (acute kidney injury) ([place]) Active Problems:   GERD (gastroesophageal reflux disease)   Neuropathy   Headache   Essential hypertension   Chronic back pain   Morbid obesity ([place])   Depression   Hyperlipidemia   Weakness   Anxiety  Resolved Problems:   * No resolved hospital problems. *  Hospital Course: Carly Delacruz is a 56 y.o. female with a history of chronic pain, hypertension, anxiety, GERD, migraine headache, pelvic floor dysfunction requiring intermittent catheterization. Patient presented secondary to weakness and headache and found to have an AKI. Workup for headache unremarkable. IV fluids initiated for AKI.  Assessment and Plan: AKI Appears to be prerenal in nature. No recent baseline available. Creatinine of 4.17 on admission. Complicated by patient's need for intermittent in/out catheterization. Renal ultrasound without hydronephrosis. IV fluids initiated. Creatinine down to 1.86 on BMP today. Patient with good urine output. Outpatient medications adjusted for current creatinine clearance with recommendation to hold chlorthalidone. Patient will need a repeat BMP in 3-5 days to ensure resolution.   Headache Improved but still present. Bilateral. CT and MRI brain without acute etiology. Patient with associated left maxillary sinus pressure and post nasal drip. No URI symptoms. Will treat as possible acute left maxillary sinusitis.   Acute maxillary sinusitis Left side. Patient with symptoms spanning over one week. Ceftriaxone started while inpatient and transitioned to Cefdinir on  discharge to complete a 10 day course of antibiotics.   Weakness Present on admission. Also with some tremor which appears resolved. Symptoms might be secondary to gabapentin and Baclofen toxicity in setting of AKI. Symptoms are improved today. Gabapentin and Baclofen dosing adjusted for current renal function and can be further adjusted with continued improvement of AKI.   Depression Anxiety Continue home Prozac  Proteinuria Likely secondary to AKI. Repeat urinalysis as an outpatient to ensure resolution.   Pelvic floor dysfunction Stable. Continue diazepam   Primary hypertension Mild hypotension during admission. Recommendation to continue Coreg but to discontinue amlodipine and chlorthalidone secondary to AKI and mild hypotension.   GERD Continue Prilosec.   Chronic pain Patient is on buprenorphine as an outpatient.   Hypokalemia Supplementation given with resolution.   Elevated AST/ALT Unclear etiology. Asymptomatic. AST/ALT of 63/83 respectively on admission and are trending down. Hepatitis panel obtained on admission and was negative.   Morbid obesity Estimated body mass index is 43.75 kg/m as calculated from the following:   Height as of this encounter: 5\' 3"  (1.6 m).   Weight as of this encounter: 112 kg.  Consultants: None Procedures performed: None  Disposition: Home Diet recommendation: Regular diet  DISCHARGE MEDICATION: Allergies as of 12/26/2021       Reactions   Morphine And Related Shortness Of Breath, Itching   Septra [sulfamethoxazole-trimethoprim] Anaphylaxis, Hives   Sulfa Antibiotics Anaphylaxis, Hives, Swelling   Throat swelling   Sulfamethoxazole-trimethoprim Anaphylaxis, Hives   Other reaction(s): anaphylaxis   Sulfasalazine Anaphylaxis, Hives, Swelling, Other (See Comments)   Throat swelling   Ultram [tramadol] Shortness Of Breath, Anxiety, Other (See Comments)   "panic attacks"   Ciprofloxacin Other (See Comments)   This medication was  given  along with a sulfa. Reaction is unknown at this time. Must list due to possible allergy.   Darvocet [propoxyphene N-acetaminophen] Itching   Lisinopril    Other reaction(s): cough   Adhesive [tape] Rash   Codeine Itching, Other (See Comments)   No change when benadryl is given   Demerol [meperidine] Nausea Only   Hydromorphone Itching, Other (See Comments)   No change when benadryl is given   Losartan Palpitations        Medication List     STOP taking these medications    amLODipine 5 MG tablet Commonly known as: NORVASC   chlorthalidone 25 MG tablet Commonly known as: HYGROTON   diclofenac Sodium 1 % Gel Commonly known as: VOLTAREN   dicyclomine 20 MG tablet Commonly known as: BENTYL       TAKE these medications    atorvastatin 80 MG tablet Commonly known as: LIPITOR TAKE 1 TABLET BY MOUTH ONCE DAILY What changed: how much to take   baclofen 10 MG tablet Commonly known as: LIORESAL Take 0.5 tablets (5 mg total) by mouth 3 (three) times daily. What changed: how much to take   Butrans 5 MCG/HR Ptwk Generic drug: buprenorphine Place onto the skin once a week. What changed: Another medication with the same name was removed. Continue taking this medication, and follow the directions you see here.   carvedilol 12.5 MG tablet Commonly known as: COREG TAKE 1 TABLET BY MOUTH TWO TIMES DAILY What changed: how much to take   cefdinir 300 MG capsule Commonly known as: OMNICEF Take 1 capsule (300 mg total) by mouth 2 (two) times daily for 8 days. Start taking on: December 27, 2021   diazepam 5 MG tablet Commonly known as: VALIUM Insert 1 tablet (5 mg total) into the vagina every 8 hours as needed for pelvic floor spasm. What changed:  when to take this reasons to take this   FLUoxetine 40 MG capsule Commonly known as: PROzac Take 1 capsule (40 mg total) by mouth daily. What changed: Another medication with the same name was removed. Continue taking  this medication, and follow the directions you see here.   FLUoxetine 20 MG capsule Commonly known as: PROZAC Take 1 capsule (20 mg total) by mouth daily. What changed: Another medication with the same name was removed. Continue taking this medication, and follow the directions you see here.   fluticasone 50 MCG/ACT nasal spray Commonly known as: FLONASE Place 1 spray into both nostrils daily. Start taking on: December 27, 2021   furosemide 20 MG tablet Commonly known as: LASIX Take 1 tablet (20 mg total) by mouth daily as needed for fluid or edema.   gabapentin 300 MG capsule Commonly known as: NEURONTIN Take 1 capsule (300 mg total) by mouth 3 (three) times daily. What changed:  how much to take how to take this when to take this   hydrOXYzine 25 MG capsule Commonly known as: VISTARIL Take 1 capsule by mouth three times a day What changed:  how much to take when to take this reasons to take this   omeprazole 20 MG capsule Commonly known as: PRILOSEC Take 1 capsule by mouth once a day   potassium chloride 10 MEQ tablet Commonly known as: KLOR-CON M Take 1 tablet (10 mEq total) by mouth daily.   QUEtiapine 200 MG tablet Commonly known as: SEROQUEL Take 1 tablet (200 mg total) by mouth at bedtime. What changed: Another medication with the same name was removed. Continue taking this  medication, and follow the directions you see here.   sodium chloride 0.65 % Soln nasal spray Commonly known as: OCEAN Place 1 spray into both nostrils as needed for congestion.   Turmeric Powd Take 1 each by mouth daily.   Vitamin B-12 2500 MCG Subl Take 1 tablet by mouth daily.        Discharge Exam: BP 105/79 (BP Location: Left Arm)   Pulse 73   Temp 97.7 F (36.5 C) (Oral)   Resp 20   Ht 5\' 3"  (1.6 m)   Wt 112 kg   SpO2 97%   BMI 43.75 kg/m   General exam: Appears calm and comfortable Respiratory system: Respiratory effort normal. Central nervous system: Alert and  oriented. No focal neurological deficits. Psychiatry: Judgement and insight appear normal. Mood & affect appropriate.   Condition at discharge: stable  The results of significant diagnostics from this hospitalization (including imaging, microbiology, ancillary and laboratory) are listed below for reference.   Imaging Studies: US RENAL  Result Date: 12/24/2021 CLINICAL DATA:  Acute kidney injury EXAM: RENAL / URINARY TRACT ULTRASOUND COMPLETE COMPARISON:  09/10/2009 FINDINGS: Right Kidney: Renal measurements: 12.2 x 6.6 x 5.5 cm = volume: 231 mL. Echogenicity within normal limits. No mass or hydronephrosis visualized. Left Kidney: Renal measurements: 10.6 x 5.5 x 4.1 cm = volume: 123 mL. Echogenicity within normal limits. No mass or hydronephrosis visualized. Bladder: Appears normal for degree of bladder distention. Other: None. IMPRESSION: No acute findings.  No hydronephrosis. Electronically Signed   By: Rolm Baptise M.D.   On: 12/24/2021 14:17   MR BRAIN WO CONTRAST  Result Date: 12/24/2021 CLINICAL DATA:  Acute neuro deficit rule out stroke. Headache and dizziness EXAM: MRI HEAD WITHOUT CONTRAST MRA HEAD WITHOUT CONTRAST TECHNIQUE: Multiplanar, multi-echo pulse sequences of the brain and surrounding structures were acquired without intravenous contrast. Angiographic images of the Circle of Willis were acquired using MRA technique without intravenous contrast. COMPARISON:  CT head 12/24/2021 FINDINGS: MRI HEAD FINDINGS Brain: No acute infarction, hemorrhage, hydrocephalus, extra-axial collection or mass lesion. Few small white matter hyperintensities. Vascular: Normal arterial flow voids Skull and upper cervical spine: Negative Sinuses/Orbits: Mucosal edema sphenoid sinus and maxillary sinus bilaterally. Negative orbit Other: None MRA HEAD FINDINGS Anterior circulation: Internal carotid artery widely patent. Anterior middle cerebral arteries normal. No stenosis or aneurysm Posterior circulation:  Both vertebral arteries patent to the basilar. PICA not visualized. Dominant right AICA and small left AICA noted. Basilar patent. Superior cerebellar and posterior cerebral arteries normal without stenosis or aneurysm Anatomic variants: None IMPRESSION: 1. No acute intracranial abnormality. Mild white matter changes likely due to chronic microvascular ischemia or chronic migraine headache. 2. Negative MRA head. Electronically Signed   By: Franchot Gallo M.D.   On: 12/24/2021 13:50   MR ANGIO HEAD WO CONTRAST  Result Date: 12/24/2021 CLINICAL DATA:  Acute neuro deficit rule out stroke. Headache and dizziness EXAM: MRI HEAD WITHOUT CONTRAST MRA HEAD WITHOUT CONTRAST TECHNIQUE: Multiplanar, multi-echo pulse sequences of the brain and surrounding structures were acquired without intravenous contrast. Angiographic images of the Circle of Willis were acquired using MRA technique without intravenous contrast. COMPARISON:  CT head 12/24/2021 FINDINGS: MRI HEAD FINDINGS Brain: No acute infarction, hemorrhage, hydrocephalus, extra-axial collection or mass lesion. Few small white matter hyperintensities. Vascular: Normal arterial flow voids Skull and upper cervical spine: Negative Sinuses/Orbits: Mucosal edema sphenoid sinus and maxillary sinus bilaterally. Negative orbit Other: None MRA HEAD FINDINGS Anterior circulation: Internal carotid artery widely patent. Anterior middle cerebral  arteries normal. No stenosis or aneurysm Posterior circulation: Both vertebral arteries patent to the basilar. PICA not visualized. Dominant right AICA and small left AICA noted. Basilar patent. Superior cerebellar and posterior cerebral arteries normal without stenosis or aneurysm Anatomic variants: None IMPRESSION: 1. No acute intracranial abnormality. Mild white matter changes likely due to chronic microvascular ischemia or chronic migraine headache. 2. Negative MRA head. Electronically Signed   By: Franchot Gallo M.D.   On: 12/24/2021  13:50   CT HEAD WO CONTRAST (5MM)  Result Date: 12/24/2021 CLINICAL DATA:  56 year old female with headache, dizziness, left side pain since 0400 hours yesterday. EXAM: CT HEAD WITHOUT CONTRAST TECHNIQUE: Contiguous axial images were obtained from the base of the skull through the vertex without intravenous contrast. RADIATION DOSE REDUCTION: This exam was performed according to the departmental dose-optimization program which includes automated exposure control, adjustment of the mA and/or kV according to patient size and/or use of iterative reconstruction technique. COMPARISON:  Brain MRI and Head CT 08/12/2016. FINDINGS: Brain: Cerebral volume is stable and within normal limits. No midline shift, ventriculomegaly, mass effect, evidence of mass lesion, intracranial hemorrhage or evidence of cortically based acute infarction. Gray-white matter differentiation is within normal limits throughout the brain. Vascular: No suspicious intracranial vascular hyperdensity. Skull: No acute osseous abnormality identified. Sinuses/Orbits: Right sphenoid sinus is subtotally opacified now with mucoperiosteal thickening. Other Visualized paranasal sinuses and mastoids are clear. Other: No acute orbit or scalp soft tissue finding. IMPRESSION: 1. Stable and normal noncontrast CT appearance of the brain. 2. Chronic right sphenoid sinusitis, new since 2018. Electronically Signed   By: Genevie Ann M.D.   On: 12/24/2021 11:01   DG Chest Portable 1 View  Result Date: 12/24/2021 CLINICAL DATA:  headache, dizziness, twitching, pain on L side since waking at 0400 yesterday weakness EXAM: PORTABLE CHEST 1 VIEW COMPARISON:  None Available. FINDINGS: Normal mediastinum and cardiac silhouette. Normal pulmonary vasculature. No evidence of effusion, infiltrate, or pneumothorax. No acute bony abnormality. IMPRESSION: No acute cardiopulmonary process. Electronically Signed   By: Suzy Bouchard M.D.   On: 12/24/2021 09:08     Microbiology: Results for orders placed or performed in visit on 12/18/18  Novel Coronavirus, NAA ([facility])     Status: None   Collection Time: 12/18/18 12:00 AM   Specimen: Nasopharyngeal(NP) swabs in vial transport medium   NASOPHARYNGE  TESTING  Result Value Ref Range Status   SARS-CoV-2, NAA Not Detected Not Detected Final    Comment: This nucleic acid amplification test was developed and its performance characteristics determined by [employer]. Nucleic acid amplification tests include PCR and TMA. This test has not been FDA cleared or approved. This test has been authorized by FDA under an Emergency Use Authorization (EUA). This test is only authorized for the duration of time the declaration that circumstances exist justifying the authorization of the emergency use of in vitro diagnostic tests for detection of SARS-CoV-2 virus and/or diagnosis of COVID-19 infection under section 564(b)(1) of the Act, 21 U.S.C. [id number]) (1), unless the authorization is terminated or revoked sooner. When diagnostic testing is negative, the possibility of a false negative result should be considered in the context of a patient's recent exposures and the presence of clinical signs and symptoms consistent with COVID-19. An individual without symptoms of COVID-19 and who is not shedding SARS-CoV-2 virus would  expect to have a negative (not detected) result in this assay.     Labs: CBC: Recent Labs  Lab 12/24/21 0810 12/25/21 0549  WBC 10.3 7.5  NEUTROABS 7.1  --   HGB 13.3 12.0  HCT 42.2 38.1  MCV 85.6 86.0  PLT 256 650   Basic Metabolic Panel: Recent Labs  Lab 12/24/21 0810 12/25/21 0549  NA 138 138  K 3.5 3.4*  CL 100 101  CO2 27 29  GLUCOSE 142* 111*  BUN 43* 33*  CREATININE 4.17* 2.71*  CALCIUM 9.8 8.7*   Liver Function Tests: Recent Labs  Lab 12/24/21 0810 12/25/21 0549  AST 63* 55*  ALT 83* 69*  ALKPHOS 101 88  BILITOT 0.5 0.5  PROT 8.0 6.6   ALBUMIN 4.1 3.3*   Discharge time spent: 35 minutes.  Signed: Cordelia Poche, MD [facility] 12/26/2021

## 2021-12-26 NOTE — Evaluation (Signed)
Occupational Therapy Evaluation and Discharge Patient Details Name: Carly Delacruz MRN: [id number] DOB: [birth year] Today's Date: 12/26/2021   History of Present Illness 56 y.o. female with medical history significant of chronic pain, HTN, anxiety, GERD, gastric bypass 2007, PTSD. Presenting with weakness and headache. Dx of AKI.   Clinical Impression   Pt is functioning modified independently in ADLs, toilet transfers and ambulation with RW in the absence of her cane. She is well aware of her limitations and takes rest breaks as needed at home. No OT needs.      Recommendations for follow up therapy are one component of a multi-disciplinary discharge planning process, led by the attending physician.  Recommendations may be updated based on patient status, additional functional criteria and insurance authorization.   Follow Up Recommendations  No OT follow up    Assistance Recommended at Discharge    Patient can return home with the following      Functional Status Assessment  Patient has had a recent decline in their functional status and demonstrates the ability to make significant improvements in function in a reasonable and predictable amount of time.  Equipment Recommendations  None recommended by OT    Recommendations for Other Services       Precautions / Restrictions Precautions Precautions: Fall Restrictions Weight Bearing Restrictions: No      Mobility Bed Mobility               General bed mobility comments: in chair    Transfers Overall transfer level: Modified independent Equipment used: Rolling walker (2 wheels)               General transfer comment: from bed and toilet      Balance Overall balance assessment: Needs assistance   Sitting balance-Leahy Scale: Good     Standing balance support: During functional activity, Reliant on assistive device for balance Standing balance-Leahy Scale: Fair                              ADL either performed or assessed with clinical judgement   ADL Overall ADL's : Modified independent                                             Vision Baseline Vision/History: 1 Wears glasses Ability to See in Adequate Light: 0 Adequate Patient Visual Report: No change from baseline       Perception     Praxis      Pertinent Vitals/Pain Pain Assessment Pain Assessment: No/denies pain     Hand Dominance Right   Extremity/Trunk Assessment Upper Extremity Assessment Upper Extremity Assessment: Overall WFL for tasks assessed   Lower Extremity Assessment Lower Extremity Assessment: Defer to PT evaluation   Cervical / Trunk Assessment Cervical / Trunk Assessment: Normal   Communication Communication Communication: No difficulties   Cognition Arousal/Alertness: Awake/alert Behavior During Therapy: WFL for tasks assessed/performed Overall Cognitive Status: Within Functional Limits for tasks assessed                                       General Comments       Exercises     Shoulder Instructions      Home Living Family/patient expects to be  discharged to:: Private residence Living Arrangements: Parent Available Help at Discharge: Family Type of Home: House Home Access: Stairs to enter Secretary/administrator of Steps: 2 Entrance Stairs-Rails: Right;Left Home Layout: One level     Bathroom Shower/Tub: Chief Strategy Officer: Standard     Home Equipment: Medical laboratory scientific officer - single point;Rollator (4 wheels);BSC/3in1;Shower seat          Prior Functioning/Environment Prior Level of Function : Independent/Modified Independent;Driving             Mobility Comments: walks with SPC, no falls in past 6 months ADLs Comments: gets down in tub, cares for her 35 year old mom        OT Problem List:        OT Treatment/Interventions:      OT Goals(Current goals can be found in the care plan section)    OT  Frequency:      Co-evaluation              AM-PAC OT "6 Clicks" Daily Activity     Outcome Measure Help from another person eating meals?: None Help from another person taking care of personal grooming?: None Help from another person toileting, which includes using toliet, bedpan, or urinal?: None Help from another person bathing (including washing, rinsing, drying)?: None Help from another person to put on and taking off regular upper body clothing?: None Help from another person to put on and taking off regular lower body clothing?: None 6 Click Score: 24   End of Session Equipment Utilized During Treatment: Rolling walker (2 wheels)  Activity Tolerance: Patient tolerated treatment well Patient left: in chair;with call bell/phone within reach  OT Visit Diagnosis: Other abnormalities of gait and mobility (R26.89)                Time: 9021-1155 OT Time Calculation (min): 36 min Charges:  OT General Charges $OT Visit: 1 Visit OT Evaluation $OT Eval Low Complexity: 1 Low OT Treatments $Self Care/Home Management : 8-22 mins  Carly Delacruz, OTR/L Acute Rehabilitation Services Office: [phone]  Carly Delacruz 12/26/2021, 12:00 PM

## 2021-12-28 ENCOUNTER — Other Ambulatory Visit (HOSPITAL_COMMUNITY): Payer: Self-pay

## 2021-12-30 DIAGNOSIS — N179 Acute kidney failure, unspecified: Secondary | ICD-10-CM | POA: Diagnosis not present

## 2021-12-30 DIAGNOSIS — M810 Age-related osteoporosis without current pathological fracture: Secondary | ICD-10-CM | POA: Diagnosis not present

## 2021-12-30 DIAGNOSIS — I10 Essential (primary) hypertension: Secondary | ICD-10-CM | POA: Diagnosis not present

## 2021-12-30 DIAGNOSIS — Z09 Encounter for follow-up examination after completed treatment for conditions other than malignant neoplasm: Secondary | ICD-10-CM | POA: Diagnosis not present

## 2021-12-30 DIAGNOSIS — I952 Hypotension due to drugs: Secondary | ICD-10-CM | POA: Diagnosis not present

## 2021-12-30 DIAGNOSIS — R748 Abnormal levels of other serum enzymes: Secondary | ICD-10-CM | POA: Diagnosis not present

## 2021-12-30 DIAGNOSIS — R519 Headache, unspecified: Secondary | ICD-10-CM | POA: Diagnosis not present

## 2021-12-30 DIAGNOSIS — Z9884 Bariatric surgery status: Secondary | ICD-10-CM | POA: Diagnosis not present

## 2021-12-30 DIAGNOSIS — E559 Vitamin D deficiency, unspecified: Secondary | ICD-10-CM | POA: Diagnosis not present

## 2022-01-04 ENCOUNTER — Other Ambulatory Visit (HOSPITAL_COMMUNITY): Payer: Self-pay

## 2022-01-04 DIAGNOSIS — R2689 Other abnormalities of gait and mobility: Secondary | ICD-10-CM | POA: Diagnosis not present

## 2022-01-04 DIAGNOSIS — R519 Headache, unspecified: Secondary | ICD-10-CM | POA: Diagnosis not present

## 2022-01-04 MED ORDER — PREDNISONE 50 MG PO TABS
50.0000 mg | ORAL_TABLET | Freq: Every day | ORAL | 0 refills | Status: DC
  Filled 2022-01-04: qty 5, 5d supply, fill #0

## 2022-01-05 ENCOUNTER — Other Ambulatory Visit (HOSPITAL_COMMUNITY): Payer: Self-pay

## 2022-01-08 ENCOUNTER — Other Ambulatory Visit (HOSPITAL_COMMUNITY): Payer: Self-pay

## 2022-01-08 MED ORDER — UBRELVY 100 MG PO TABS
100.0000 mg | ORAL_TABLET | ORAL | 1 refills | Status: DC | PRN
  Filled 2022-01-08: qty 10, 30d supply, fill #0

## 2022-01-09 ENCOUNTER — Other Ambulatory Visit (HOSPITAL_COMMUNITY): Payer: Self-pay

## 2022-01-11 ENCOUNTER — Other Ambulatory Visit (HOSPITAL_COMMUNITY): Payer: Self-pay

## 2022-01-12 ENCOUNTER — Other Ambulatory Visit (HOSPITAL_COMMUNITY): Payer: Self-pay

## 2022-01-14 ENCOUNTER — Other Ambulatory Visit (HOSPITAL_COMMUNITY): Payer: Self-pay

## 2022-01-14 DIAGNOSIS — R519 Headache, unspecified: Secondary | ICD-10-CM | POA: Diagnosis not present

## 2022-01-14 DIAGNOSIS — R945 Abnormal results of liver function studies: Secondary | ICD-10-CM | POA: Diagnosis not present

## 2022-01-14 DIAGNOSIS — G4700 Insomnia, unspecified: Secondary | ICD-10-CM | POA: Diagnosis not present

## 2022-01-14 DIAGNOSIS — E7800 Pure hypercholesterolemia, unspecified: Secondary | ICD-10-CM | POA: Diagnosis not present

## 2022-01-14 MED ORDER — ATORVASTATIN CALCIUM 40 MG PO TABS
40.0000 mg | ORAL_TABLET | Freq: Every day | ORAL | 3 refills | Status: AC
  Filled 2022-01-14: qty 90, 90d supply, fill #0

## 2022-01-14 MED ORDER — QUETIAPINE FUMARATE 200 MG PO TABS
200.0000 mg | ORAL_TABLET | Freq: Every day | ORAL | 3 refills | Status: DC
  Filled 2022-01-14 – 2022-04-16 (×4): qty 90, 90d supply, fill #0

## 2022-01-14 MED ORDER — SUMATRIPTAN SUCCINATE 100 MG PO TABS
100.0000 mg | ORAL_TABLET | Freq: Every day | ORAL | 3 refills | Status: DC
  Filled 2022-01-14: qty 9, 30d supply, fill #0

## 2022-01-15 ENCOUNTER — Other Ambulatory Visit (HOSPITAL_COMMUNITY): Payer: Self-pay

## 2022-01-18 ENCOUNTER — Other Ambulatory Visit (HOSPITAL_COMMUNITY): Payer: Self-pay

## 2022-01-18 DIAGNOSIS — G894 Chronic pain syndrome: Secondary | ICD-10-CM | POA: Diagnosis not present

## 2022-01-18 DIAGNOSIS — M47816 Spondylosis without myelopathy or radiculopathy, lumbar region: Secondary | ICD-10-CM | POA: Diagnosis not present

## 2022-01-18 DIAGNOSIS — M461 Sacroiliitis, not elsewhere classified: Secondary | ICD-10-CM | POA: Diagnosis not present

## 2022-01-18 DIAGNOSIS — G4700 Insomnia, unspecified: Secondary | ICD-10-CM | POA: Diagnosis not present

## 2022-01-18 MED ORDER — HYDROXYZINE PAMOATE 25 MG PO CAPS
25.0000 mg | ORAL_CAPSULE | Freq: Three times a day (TID) | ORAL | 0 refills | Status: DC
  Filled 2022-01-18: qty 270, 90d supply, fill #0

## 2022-01-18 MED ORDER — GABAPENTIN 300 MG PO CAPS
300.0000 mg | ORAL_CAPSULE | Freq: Three times a day (TID) | ORAL | 0 refills | Status: DC
  Filled 2022-01-18: qty 270, 90d supply, fill #0

## 2022-01-18 MED ORDER — TIZANIDINE HCL 2 MG PO TABS
2.0000 mg | ORAL_TABLET | Freq: Three times a day (TID) | ORAL | 0 refills | Status: AC | PRN
  Filled 2022-01-18: qty 90, 30d supply, fill #0

## 2022-01-27 DIAGNOSIS — H9202 Otalgia, left ear: Secondary | ICD-10-CM | POA: Diagnosis not present

## 2022-01-27 DIAGNOSIS — M542 Cervicalgia: Secondary | ICD-10-CM | POA: Diagnosis not present

## 2022-01-29 ENCOUNTER — Other Ambulatory Visit (HOSPITAL_COMMUNITY): Payer: Self-pay

## 2022-02-02 DIAGNOSIS — R339 Retention of urine, unspecified: Secondary | ICD-10-CM | POA: Insufficient documentation

## 2022-02-02 DIAGNOSIS — R944 Abnormal results of kidney function studies: Secondary | ICD-10-CM | POA: Diagnosis not present

## 2022-02-06 ENCOUNTER — Other Ambulatory Visit (HOSPITAL_COMMUNITY): Payer: Self-pay

## 2022-02-11 ENCOUNTER — Other Ambulatory Visit (HOSPITAL_COMMUNITY): Payer: Self-pay

## 2022-02-11 DIAGNOSIS — K219 Gastro-esophageal reflux disease without esophagitis: Secondary | ICD-10-CM | POA: Diagnosis not present

## 2022-02-11 DIAGNOSIS — N1832 Chronic kidney disease, stage 3b: Secondary | ICD-10-CM | POA: Diagnosis not present

## 2022-02-11 DIAGNOSIS — R296 Repeated falls: Secondary | ICD-10-CM | POA: Diagnosis not present

## 2022-02-11 DIAGNOSIS — R339 Retention of urine, unspecified: Secondary | ICD-10-CM | POA: Diagnosis not present

## 2022-02-11 DIAGNOSIS — R42 Dizziness and giddiness: Secondary | ICD-10-CM | POA: Diagnosis not present

## 2022-02-11 DIAGNOSIS — G4700 Insomnia, unspecified: Secondary | ICD-10-CM | POA: Diagnosis not present

## 2022-02-11 DIAGNOSIS — R519 Headache, unspecified: Secondary | ICD-10-CM | POA: Diagnosis not present

## 2022-02-11 MED ORDER — FAMOTIDINE 20 MG PO TABS
20.0000 mg | ORAL_TABLET | Freq: Every day | ORAL | 5 refills | Status: DC
  Filled 2022-02-11: qty 90, 90d supply, fill #0

## 2022-02-11 MED ORDER — TRAZODONE HCL 150 MG PO TABS
150.0000 mg | ORAL_TABLET | Freq: Every evening | ORAL | 3 refills | Status: DC
  Filled 2022-02-11: qty 180, 90d supply, fill #0

## 2022-02-12 ENCOUNTER — Other Ambulatory Visit (HOSPITAL_COMMUNITY): Payer: Self-pay

## 2022-02-23 ENCOUNTER — Other Ambulatory Visit (HOSPITAL_COMMUNITY): Payer: Self-pay

## 2022-03-05 ENCOUNTER — Other Ambulatory Visit (HOSPITAL_COMMUNITY): Payer: Self-pay

## 2022-03-12 ENCOUNTER — Other Ambulatory Visit (HOSPITAL_COMMUNITY): Payer: Self-pay

## 2022-03-15 DIAGNOSIS — Z79891 Long term (current) use of opiate analgesic: Secondary | ICD-10-CM | POA: Diagnosis not present

## 2022-03-15 DIAGNOSIS — M461 Sacroiliitis, not elsewhere classified: Secondary | ICD-10-CM | POA: Diagnosis not present

## 2022-03-15 DIAGNOSIS — G894 Chronic pain syndrome: Secondary | ICD-10-CM | POA: Diagnosis not present

## 2022-03-15 DIAGNOSIS — M47816 Spondylosis without myelopathy or radiculopathy, lumbar region: Secondary | ICD-10-CM | POA: Diagnosis not present

## 2022-03-15 DIAGNOSIS — G4700 Insomnia, unspecified: Secondary | ICD-10-CM | POA: Diagnosis not present

## 2022-03-16 ENCOUNTER — Other Ambulatory Visit (HOSPITAL_COMMUNITY): Payer: Self-pay

## 2022-03-25 ENCOUNTER — Other Ambulatory Visit (HOSPITAL_COMMUNITY): Payer: Self-pay

## 2022-04-05 ENCOUNTER — Encounter: Payer: Self-pay | Admitting: Neurology

## 2022-04-16 ENCOUNTER — Other Ambulatory Visit (HOSPITAL_COMMUNITY): Payer: Self-pay

## 2022-04-21 DIAGNOSIS — R3911 Hesitancy of micturition: Secondary | ICD-10-CM | POA: Diagnosis not present

## 2022-04-26 DIAGNOSIS — N3281 Overactive bladder: Secondary | ICD-10-CM | POA: Diagnosis not present

## 2022-04-26 DIAGNOSIS — R339 Retention of urine, unspecified: Secondary | ICD-10-CM | POA: Diagnosis not present

## 2022-04-27 ENCOUNTER — Other Ambulatory Visit (INDEPENDENT_AMBULATORY_CARE_PROVIDER_SITE_OTHER): Payer: Medicare Other

## 2022-04-27 ENCOUNTER — Encounter: Payer: Self-pay | Admitting: Neurology

## 2022-04-27 ENCOUNTER — Ambulatory Visit (INDEPENDENT_AMBULATORY_CARE_PROVIDER_SITE_OTHER): Payer: Medicare Other | Admitting: Neurology

## 2022-04-27 ENCOUNTER — Other Ambulatory Visit (HOSPITAL_COMMUNITY): Payer: Self-pay

## 2022-04-27 VITALS — BP 132/92 | HR 78 | Ht 63.0 in | Wt 247.0 lb

## 2022-04-27 DIAGNOSIS — H519 Unspecified disorder of binocular movement: Secondary | ICD-10-CM

## 2022-04-27 DIAGNOSIS — H518 Other specified disorders of binocular movement: Secondary | ICD-10-CM | POA: Diagnosis not present

## 2022-04-27 DIAGNOSIS — R202 Paresthesia of skin: Secondary | ICD-10-CM | POA: Diagnosis not present

## 2022-04-27 DIAGNOSIS — R519 Headache, unspecified: Secondary | ICD-10-CM | POA: Diagnosis not present

## 2022-04-27 DIAGNOSIS — R200 Anesthesia of skin: Secondary | ICD-10-CM

## 2022-04-27 LAB — SEDIMENTATION RATE: Sed Rate: 56 mm/hr — ABNORMAL HIGH (ref 0–30)

## 2022-04-27 LAB — CBC
HCT: 38.6 % (ref 36.0–46.0)
Hemoglobin: 12.3 g/dL (ref 12.0–15.0)
MCHC: 31.9 g/dL (ref 30.0–36.0)
MCV: 85.2 fl (ref 78.0–100.0)
Platelets: 267 10*3/uL (ref 150.0–400.0)
RBC: 4.53 Mil/uL (ref 3.87–5.11)
RDW: 15 % (ref 11.5–15.5)
WBC: 5 10*3/uL (ref 4.0–10.5)

## 2022-04-27 LAB — COMPREHENSIVE METABOLIC PANEL
ALT: 17 U/L (ref 0–35)
AST: 19 U/L (ref 0–37)
Albumin: 3.9 g/dL (ref 3.5–5.2)
Alkaline Phosphatase: 76 U/L (ref 39–117)
BUN: 7 mg/dL (ref 6–23)
CO2: 30 mEq/L (ref 19–32)
Calcium: 9.8 mg/dL (ref 8.4–10.5)
Chloride: 103 mEq/L (ref 96–112)
Creatinine, Ser: 0.94 mg/dL (ref 0.40–1.20)
GFR: 66.78 mL/min (ref >60.00)
Glucose, Bld: 97 mg/dL (ref 70–99)
Potassium: 4 mEq/L (ref 3.5–5.1)
Sodium: 142 mEq/L (ref 135–145)
Total Bilirubin: 0.3 mg/dL (ref 0.2–1.2)
Total Protein: 7 g/dL (ref 6.0–8.3)

## 2022-04-27 LAB — VITAMIN B12: Vitamin B-12: 433 pg/mL (ref 211–911)

## 2022-04-27 LAB — C-REACTIVE PROTEIN: CRP: <1 mg/dL (ref 0.5–20.0)

## 2022-04-27 LAB — TSH: TSH: 1.31 u[IU]/mL (ref 0.35–5.50)

## 2022-04-27 MED ORDER — DIVALPROEX SODIUM ER 250 MG PO TB24
250.0000 mg | ORAL_TABLET | Freq: Every day | ORAL | 5 refills | Status: DC
  Filled 2022-04-27: qty 30, 30d supply, fill #0

## 2022-04-27 NOTE — Patient Instructions (Addendum)
For headaches, start divalproex '250mg'$  at bedtime.  We can increase dose in 4 weeks if needed. Check MRI of brain and orbits  Check B12, TSH, sed rate, CRP Further recommendations pending results Follow up 4 months.

## 2022-04-27 NOTE — Progress Notes (Signed)
NEUROLOGY CONSULTATION NOTE  Carly Delacruz MRN: [id number] DOB: 06/12/1965  Referring provider: Janie Morning, DO Primary care provider: Janie Morning, DO  Reason for consult:  dizziness, frequent falls, urinary incontinence  Assessment/Plan:   Left sided new persistent headache, unclear etiology Dysconjugate gaze Frequent falls - likely multifactorial related to underlying degenerative lumbar disease, arthritis, and polypharmacy. She does not have multiple sclerosis  For headache, will start her on Depakote ER '250mg'$  at bedtime.  Will check CBC, CMP and TSH.  Monitor for side effects as there is concern for polypharmacy.  We can increase dose if needed. Will repeat MRI of brain as well as orbits.  Given her reduced kidney function, may not be able to have performed with contrast. Refer to ophthalmology Check B12, TSH, sed rate, CRP Follow up in 4 months.   Subjective:  Carly Delacruz is a 57 year old female with chronic back pain, hypertension, depression, anxiety who presents for dizziness, frequent falls, urinary incontinence.  Concern for multiple sclerosis.  History supplemented by hospital records and referring provider's note.  She has longstanding history of frequent falls since around 2017.  When she stands up after sitting for a prolonged period of time, her legs feel numb and she cannot move her legs until she gets feeling back.  It takes about 4 minutes.  She may fall while walking, usually her legs just give-out.  This is associated with pain and numbness in the legs.  She has chronic back pain with degenerative disease of the lumbar spine.  MRI lumbar spine report (imaging not available) from 05/16/17 demonstrated increased lordosis with mild degenerative disc disease causing "central, lateral, recess and foraminal stenosis at L3-L4 and L4-L5, worse at L4-L5" and also "foraminal narrowing at L5-S1 and minimal/mild lateral recess narrowing at L2-L3". She also has  arthritis involving the knees.  She also has depression and anxiety.  She has had multiple changes in her medications, such as psychotropic and pain   On 12/24/2021, she woke up with new severe headache, dizziness and generalized weakness.  She was hospitalized and found to have AKI.  MRI of brain personally reviewed showed mild chronic small vessel ischemic changes but no acute intracranial abnormality.  MRA of head was negative for aneurysm, LVO, hemodynamically significant stenosis or other vascular abnormality.  She was treated but continued to have dizziness and headache since then.  The headache is persistent but fluctuates in severity from dull to severe.  She describes a pressure involving the entire right side of her face, most prominently involving the left eye.  It gets worse when she gets up too quickly and will dissipate after a few minutes when she sits back down to rest.  There is associated phonophobia and some photophobia.  Sometimes if she is watching TV or while driving, she will get double vision with one image above the other.  She reports nausea and sometimes vomiting, but that only started about 3 weeks ago.  Denies autonomic symptoms.  Sumatriptan ineffective.  Ubrelvy effective but not covered by her insurance.  Years ago, she had "ice cream" headaches but otherwise no history of headache.  She did see ENT in November for the headache and dizziness.  Inner ear etiology was detected but thought it may have been cervicogenic or related to TMJ dysfunction.    She has pelvic floor dysfunction with history of increased urinary frequency and retention, requiring self-catheterization.  She is being worked up by urology.  PAST MEDICAL HISTORY:  Past Medical History:  Diagnosis Date   Allergy    Anemia    Anxiety    Arthritis 12/10/03   Atrophic vaginitis 2009   BV (bacterial vaginosis) 2006   Chronic back pain    Chronic LLQ pain    Chronic post-traumatic stress disorder (PTSD)     Complication of anesthesia    Elevated LFTs    Endometriosis    Fatigue 2012   Gallstones    GERD (gastroesophageal reflux disease)    H/O bladder infections    H/O mumps    H/O varicella    History of candidal vulvovaginitis 03/21/03   HTN (hypertension)    Insomnia    Insomnia secondary to situational depression 06/06/07   Liver mass, right lobe 08/13/04   Nodule   Malaise 2012   Morbid obesity ([place]) 2005   Osteoporosis    Pelvic floor dysfunction    S/P gastric bypass    Severe episode of recurrent major depressive disorder, without psychotic features ([place])    Sleep apnea 2005   SUI (stress urinary incontinence, female) 08/12/03   Trichomonas    Vitamin D deficiency    Vulvar lesion 03/21/03   Yeast infection     PAST SURGICAL HISTORY: Past Surgical History:  Procedure Laterality Date   ABDOMINAL HYSTERECTOMY  2001   APPENDECTOMY  2001   CHOLECYSTECTOMY  2011   DILATION AND CURETTAGE OF UTERUS     GASTRIC BYPASS  2007   [facility], Dr. Hassell Done   KNEE ARTHROSCOPY Left    LAPAROTOMY     TONSILLECTOMY     WISDOM TOOTH EXTRACTION      MEDICATIONS: Current Outpatient Medications on File Prior to Visit  Medication Sig Dispense Refill   atorvastatin (LIPITOR) 40 MG tablet Take 1 tablet (40 mg total) by mouth daily for cholesterol 90 tablet 3   atorvastatin (LIPITOR) 80 MG tablet TAKE 1 TABLET BY MOUTH ONCE DAILY (Patient taking differently: Take 80 mg by mouth daily.) 90 tablet 3   baclofen (LIORESAL) 10 MG tablet Take 0.5 tablets (5 mg total) by mouth 3 (three) times daily. 90 tablet 1   buprenorphine (BUTRANS) 5 MCG/HR PTWK Place onto the skin once a week.     carvedilol (COREG) 12.5 MG tablet TAKE 1 TABLET BY MOUTH TWO TIMES DAILY (Patient taking differently: Take 12.5 mg by mouth 2 (two) times daily.) 180 tablet 3   Cyanocobalamin (VITAMIN B-12) 2500 MCG SUBL Take 1 tablet by mouth daily.     diazepam (VALIUM) 5 MG tablet Insert 1 tablet (5 mg total) into  the vagina every 8 hours as needed for pelvic floor spasm. (Patient taking differently: Take 5 mg by mouth every 8 (eight) hours as needed for anxiety.) 60 tablet 2   famotidine (PEPCID) 20 MG tablet Take 1 tablet (20 mg total) by mouth daily for acid reflux. 90 tablet 5   FLUoxetine (PROZAC) 20 MG capsule Take 1 capsule (20 mg total) by mouth daily. 90 capsule 1   FLUoxetine (PROZAC) 40 MG capsule Take 1 capsule (40 mg total) by mouth daily. 90 capsule 0   fluticasone (FLONASE) 50 MCG/ACT nasal spray Place 1 spray into both nostrils daily. 16 g 2   furosemide (LASIX) 20 MG tablet Take 1 tablet (20 mg total) by mouth daily as needed for fluid or edema. 90 tablet 3   gabapentin (NEURONTIN) 300 MG capsule Take 1 capsule (300 mg total) by mouth 3 (three) times daily. 360 capsule 0  gabapentin (NEURONTIN) 300 MG capsule Take 1 capsule (300 mg total) by mouth 3 (three) times daily. 270 capsule 0   hydrOXYzine (VISTARIL) 25 MG capsule Take 1 capsule by mouth three times a day (Patient taking differently: Take 25 mg by mouth every 8 (eight) hours as needed (For sleep per patient).) 270 capsule 0   hydrOXYzine (VISTARIL) 25 MG capsule Take 1 capsule (25 mg total) by mouth 3 (three) times daily. 270 capsule 0   omeprazole (PRILOSEC) 20 MG capsule Take 1 capsule by mouth once a day (Patient not taking: Reported on 12/24/2021) 90 capsule 3   potassium chloride (KLOR-CON M) 10 MEQ tablet Take 1 tablet (10 mEq total) by mouth daily. 90 tablet 3   predniSONE (DELTASONE) 50 MG tablet Take 1 tablet (50 mg total) by mouth once daily for 5 days. 5 tablet 0   QUEtiapine (SEROQUEL) 200 MG tablet Take 1 tablet (200 mg total) by mouth at bedtime. 90 tablet 1   QUEtiapine (SEROQUEL) 200 MG tablet Take 1 tablet (200 mg total) by mouth once a day at bedtime. 90 tablet 3   sodium chloride (OCEAN) 0.65 % SOLN nasal spray Place 1 spray into both nostrils as needed for congestion.  0   SUMAtriptan (IMITREX) 100 MG tablet Take 1  tablet by mouth as needed, at least 2 hours between doses, max dose 2 tablets (200 mg) in 24 hours. 9 tablet 3   tiZANidine (ZANAFLEX) 2 MG tablet Take 1 tablet (2 mg total) by mouth 3 (three) times daily as needed. 90 tablet 0   traZODone (DESYREL) 150 MG tablet Take 1-2 tablets (150-300 mg total) by mouth at bedtime for sleep. 180 tablet 3   Turmeric POWD Take 1 each by mouth daily.     Ubrogepant (UBRELVY) 100 MG TABS Take 100 mg by mouth as needed. May take a second dose at least 2 hours after the first dose if needed 10 tablet 1   No current facility-administered medications on file prior to visit.    ALLERGIES: Allergies  Allergen Reactions   Morphine And Related Shortness Of Breath and Itching   Septra [Sulfamethoxazole-Trimethoprim] Anaphylaxis and Hives   Sulfa Antibiotics Anaphylaxis, Hives and Swelling    Throat swelling   Sulfamethoxazole-Trimethoprim Anaphylaxis and Hives    Other reaction(s): anaphylaxis   Sulfasalazine Anaphylaxis, Hives, Swelling and Other (See Comments)    Throat swelling   Ultram [Tramadol] Shortness Of Breath, Anxiety and Other (See Comments)    "panic attacks"    Ciprofloxacin Other (See Comments)    This medication was given along with a sulfa. Reaction is unknown at this time. Must list due to possible allergy.   Darvocet [Propoxyphene N-Acetaminophen] Itching   Lisinopril     Other reaction(s): cough   Adhesive [Tape] Rash   Codeine Itching and Other (See Comments)    No change when benadryl is given   Demerol [Meperidine] Nausea Only   Hydromorphone Itching and Other (See Comments)    No change when benadryl is given   Losartan Palpitations    FAMILY HISTORY: Family History  Problem Relation Age of Onset   Diabetes Paternal Grandmother    Leukemia Maternal Grandmother    Arthritis Mother    High blood pressure Mother    Heart attack Mother    Prostate cancer Father    Cirrhosis Father    Colitis Sister    Hypertension Sister     Deep vein thrombosis Sister    Kidney disease Brother  Obstructive Sleep Apnea Brother    Colon polyps Neg Hx    Pancreatic cancer Neg Hx    Esophageal cancer Neg Hx    Stomach cancer Neg Hx     Objective:  Blood pressure (!) 132/92, pulse 78, height '5\' 3"'$  (1.6 m), weight 247 lb (112 kg), SpO2 98 %. General: No acute distress.  Patient appears well-groomed.   Head:  Normocephalic/atraumatic Eyes:  fundi examined but not visualized Neck: supple,  paraspinal tenderness, full range of motion Back: No paraspinal tenderness Heart: regular rate and rhythm Lungs: Clear to auscultation bilaterally. Vascular: No carotid bruits. Neurological Exam: Mental status: alert and oriented to person, place, and time, speech fluent and not dysarthric, language intact. Cranial nerves: CN I: not tested CN II: pupils equal, round and reactive to light, visual fields intact CN III, IV, VI:  full range of motion but with dysconjugate gaze particularly when tracking up or to the left.   CN V: facial sensation reduced in left V1-V3 CN VII: upper and lower face symmetric CN VIII: hearing muffled on left CN IX, X: gag intact, uvula midline CN XI: sternocleidomastoid and trapezius muscles intact CN XII: tongue midline Bulk & Tone: normal, no fasciculations. Motor:  muscle strength 5/5 throughout Sensation:  Pinprick, temperature and vibratory sensation intact. Deep Tendon Reflexes:  2+ throughout,  toes downgoing.   Finger to nose testing:  Bilateral ataxia (she reports it is related to upper extremity arthritic pain)  Gait:  Broad-based antalgic gait.  Requires cane to ambulate.  Romberg negative    Thank you for allowing me to take part in the care of this patient.  Metta Clines, DO  CC: Janie Morning, DO

## 2022-04-28 ENCOUNTER — Other Ambulatory Visit (HOSPITAL_COMMUNITY): Payer: Self-pay

## 2022-04-28 ENCOUNTER — Telehealth: Payer: Self-pay

## 2022-04-28 DIAGNOSIS — I10 Essential (primary) hypertension: Secondary | ICD-10-CM

## 2022-04-28 DIAGNOSIS — Z9884 Bariatric surgery status: Secondary | ICD-10-CM | POA: Diagnosis not present

## 2022-04-28 DIAGNOSIS — R7303 Prediabetes: Secondary | ICD-10-CM | POA: Diagnosis not present

## 2022-04-28 DIAGNOSIS — M5136 Other intervertebral disc degeneration, lumbar region: Secondary | ICD-10-CM | POA: Diagnosis not present

## 2022-04-28 DIAGNOSIS — E7800 Pure hypercholesterolemia, unspecified: Secondary | ICD-10-CM | POA: Diagnosis not present

## 2022-04-28 DIAGNOSIS — K219 Gastro-esophageal reflux disease without esophagitis: Secondary | ICD-10-CM | POA: Diagnosis not present

## 2022-04-28 DIAGNOSIS — N1832 Chronic kidney disease, stage 3b: Secondary | ICD-10-CM | POA: Diagnosis not present

## 2022-04-28 DIAGNOSIS — R519 Headache, unspecified: Secondary | ICD-10-CM | POA: Diagnosis not present

## 2022-04-28 DIAGNOSIS — R42 Dizziness and giddiness: Secondary | ICD-10-CM | POA: Diagnosis not present

## 2022-04-28 DIAGNOSIS — R296 Repeated falls: Secondary | ICD-10-CM | POA: Diagnosis not present

## 2022-04-28 NOTE — Patient Instructions (Signed)
Visit Information  Thank you for taking time to visit with me today. Please don't hesitate to contact me if I can be of assistance to you.   Following are the goals we discussed today:   Goals Addressed             This Visit's Progress    COMPLETED: Care Coordination Activities-No follow up required       Care Coordination Interventions: Advised patient to Annual Wellness exam. Discussed [facility] services and support. Assessed SDOH. Advised to discuss with primary care physician if services needed in the future.  Interventions Today    Flowsheet Row Most Recent Value  General Interventions   General Interventions Discussed/Reviewed General Interventions Discussed  Education Interventions   Education Provided Provided Education  Provided Verbal Education On [employer]              If you are experiencing a Mental Health or [place] or need someone to talk to, please call the Suicide and Crisis Lifeline: 988   Patient verbalizes understanding of instructions and care plan provided today and agrees to view in [place]. Active MyChart status and patient understanding of how to access instructions and care plan via MyChart confirmed with patient.     The patient has been provided with contact information for the care management team and has been advised to call with any health related questions or concerns.   Jone Baseman, RN, MSN [facility] Care Management Coordinator Direct Line [phone]

## 2022-04-28 NOTE — Patient Outreach (Signed)
  Care Coordination   In Person Provider Office Visit Note   04/28/2022 Name: Carly Delacruz MRN: [id number] DOB: Feb 26, 1966  Carly Delacruz is a 57 y.o. year old female who sees Pieter Partridge, DO for primary care. I engaged with Olegario Messier in the providers office today.  What matters to the patients health and wellness today?  Possible in home resources    Goals Addressed             This Visit's Progress    COMPLETED: Care Coordination Activities-No follow up required       Care Coordination Interventions: Advised patient to Annual Wellness exam. Discussed [facility] services and support. Assessed SDOH. Advised to discuss with primary care physician if services needed in the future.  Interventions Today    Flowsheet Row Most Recent Value  General Interventions   General Interventions Discussed/Reviewed General Interventions Discussed  Education Interventions   Education Provided Provided Education  Provided Verbal Education On [employer]             SDOH assessments and interventions completed:  Yes  SDOH Interventions Today    Flowsheet Row Most Recent Value  SDOH Interventions   Housing Interventions Intervention Not Indicated  Transportation Interventions Intervention Not Indicated        Care Coordination Interventions:  Yes, provided   Follow up plan: No further intervention required.   Encounter Outcome:  Pt. Visit Completed   Jone Baseman, RN, MSN [place] [facility] Care Management Coordinator Direct Line [phone]

## 2022-04-29 ENCOUNTER — Other Ambulatory Visit: Payer: Self-pay

## 2022-04-29 ENCOUNTER — Other Ambulatory Visit (HOSPITAL_COMMUNITY): Payer: Self-pay

## 2022-04-29 DIAGNOSIS — H518 Other specified disorders of binocular movement: Secondary | ICD-10-CM

## 2022-04-29 DIAGNOSIS — H519 Unspecified disorder of binocular movement: Secondary | ICD-10-CM

## 2022-04-29 NOTE — Progress Notes (Signed)
Order sent to [facility]. Per Fax back first Available not until July.   Launa Grill- Order sent

## 2022-04-30 ENCOUNTER — Telehealth: Payer: Self-pay | Admitting: *Deleted

## 2022-04-30 DIAGNOSIS — M461 Sacroiliitis, not elsewhere classified: Secondary | ICD-10-CM | POA: Diagnosis not present

## 2022-04-30 DIAGNOSIS — G894 Chronic pain syndrome: Secondary | ICD-10-CM | POA: Diagnosis not present

## 2022-04-30 DIAGNOSIS — M47816 Spondylosis without myelopathy or radiculopathy, lumbar region: Secondary | ICD-10-CM | POA: Diagnosis not present

## 2022-04-30 DIAGNOSIS — G4700 Insomnia, unspecified: Secondary | ICD-10-CM | POA: Diagnosis not present

## 2022-04-30 NOTE — Telephone Encounter (Signed)
   Telephone encounter was:  Successful.  04/30/2022 Name: Carly Delacruz MRN: [id number] DOB: [birth year]  Carly Delacruz is a 57 y.o. year old female who is a primary care patient of Pieter Partridge, DO . The community resource team was consulted for assistance with Home Modifications and Caregiver Stress  Care guide performed the following interventions: Patient provided with information about care guide support team and interviewed to confirm resource needs. Patient with a multitude of issues going on , has a major leak in her housing , i have put in a [place] [facility] referral to housing solutions and [facility] , refered her to dss for 1100 water bill.  Follow Up Plan:  Care guide will follow up with patient by phone over the next days  [place] [phone] [address] , [place] [address] Email : Ashby [email or web address] [email or web address]-moran [email or web address]

## 2022-05-03 ENCOUNTER — Other Ambulatory Visit (HOSPITAL_COMMUNITY): Payer: Self-pay

## 2022-05-04 ENCOUNTER — Telehealth: Payer: Self-pay | Admitting: *Deleted

## 2022-05-04 DIAGNOSIS — R339 Retention of urine, unspecified: Secondary | ICD-10-CM | POA: Diagnosis not present

## 2022-05-04 NOTE — Telephone Encounter (Signed)
   Telephone encounter was:  Successful.  05/04/2022 Name: Carly Delacruz MRN: [id number] DOB: [birth year]  Carly Delacruz is a 57 y.o. year old female who is a primary care patient of Pieter Partridge, DO . The community resource team was consulted for assistance with Home Modifications  Care guide performed the following interventions: Follow up call placed to community resources to determine status of patients referral. Patient has had some responses but have no other resources to provide her at this time  Follow Up Plan:  No further follow up planned at this time. The patient has been provided with needed resources. [place] [phone] [address] , [place] [address] Email : Ashby [email or web address]. [email or web address]-moran [email or web address]

## 2022-05-06 ENCOUNTER — Telehealth: Payer: Self-pay | Admitting: Neurology

## 2022-05-06 NOTE — Telephone Encounter (Signed)
Pt called in stating she called [facility] to get an appointment and they scheduled her, but they told her we had not yet sent in the referral. She would like that info sent in.

## 2022-05-06 NOTE — Telephone Encounter (Signed)
Referral resent  

## 2022-05-07 ENCOUNTER — Telehealth: Payer: Self-pay | Admitting: *Deleted

## 2022-05-07 NOTE — Progress Notes (Signed)
Patient advised of her Lab results.

## 2022-05-21 ENCOUNTER — Other Ambulatory Visit: Payer: Self-pay

## 2022-05-21 DIAGNOSIS — H519 Unspecified disorder of binocular movement: Secondary | ICD-10-CM

## 2022-05-26 ENCOUNTER — Other Ambulatory Visit (HOSPITAL_COMMUNITY): Payer: Self-pay

## 2022-05-31 DIAGNOSIS — G894 Chronic pain syndrome: Secondary | ICD-10-CM | POA: Diagnosis not present

## 2022-05-31 DIAGNOSIS — M461 Sacroiliitis, not elsewhere classified: Secondary | ICD-10-CM | POA: Diagnosis not present

## 2022-05-31 DIAGNOSIS — G4700 Insomnia, unspecified: Secondary | ICD-10-CM | POA: Diagnosis not present

## 2022-05-31 DIAGNOSIS — M47816 Spondylosis without myelopathy or radiculopathy, lumbar region: Secondary | ICD-10-CM | POA: Diagnosis not present

## 2022-06-01 ENCOUNTER — Other Ambulatory Visit (HOSPITAL_COMMUNITY): Payer: Self-pay

## 2022-06-09 ENCOUNTER — Telehealth: Payer: Self-pay | Admitting: Anesthesiology

## 2022-06-09 DIAGNOSIS — R7303 Prediabetes: Secondary | ICD-10-CM | POA: Diagnosis not present

## 2022-06-09 DIAGNOSIS — N1832 Chronic kidney disease, stage 3b: Secondary | ICD-10-CM | POA: Diagnosis not present

## 2022-06-09 DIAGNOSIS — Z87448 Personal history of other diseases of urinary system: Secondary | ICD-10-CM | POA: Diagnosis not present

## 2022-06-09 DIAGNOSIS — I129 Hypertensive chronic kidney disease with stage 1 through stage 4 chronic kidney disease, or unspecified chronic kidney disease: Secondary | ICD-10-CM | POA: Diagnosis not present

## 2022-06-09 NOTE — Telephone Encounter (Signed)
Called pt and she wanted to know about her MRI Schedule no one at [facility] had called her. I called [facility] and the dx was missing off of information. I re faxed the orders to [facility] and called her back and told her they will be calling her for scheduling and to call us back at the end of the week if no one had called.

## 2022-06-09 NOTE — Telephone Encounter (Signed)
Pt left message with AN stating she needs a call back regarding an MRI.

## 2022-06-17 DIAGNOSIS — Z1231 Encounter for screening mammogram for malignant neoplasm of breast: Secondary | ICD-10-CM | POA: Diagnosis not present

## 2022-06-17 DIAGNOSIS — M629 Disorder of muscle, unspecified: Secondary | ICD-10-CM | POA: Diagnosis not present

## 2022-06-17 DIAGNOSIS — N189 Chronic kidney disease, unspecified: Secondary | ICD-10-CM | POA: Diagnosis not present

## 2022-06-17 DIAGNOSIS — E559 Vitamin D deficiency, unspecified: Secondary | ICD-10-CM | POA: Diagnosis not present

## 2022-06-28 DIAGNOSIS — G4700 Insomnia, unspecified: Secondary | ICD-10-CM | POA: Diagnosis not present

## 2022-06-28 DIAGNOSIS — M461 Sacroiliitis, not elsewhere classified: Secondary | ICD-10-CM | POA: Diagnosis not present

## 2022-06-28 DIAGNOSIS — G894 Chronic pain syndrome: Secondary | ICD-10-CM | POA: Diagnosis not present

## 2022-06-28 DIAGNOSIS — M47816 Spondylosis without myelopathy or radiculopathy, lumbar region: Secondary | ICD-10-CM | POA: Diagnosis not present

## 2022-07-05 ENCOUNTER — Telehealth: Payer: Self-pay | Admitting: Neurology

## 2022-07-05 DIAGNOSIS — R519 Headache, unspecified: Secondary | ICD-10-CM

## 2022-07-05 DIAGNOSIS — H518 Other specified disorders of binocular movement: Secondary | ICD-10-CM

## 2022-07-05 NOTE — Telephone Encounter (Signed)
DX changed and refaxed to [facility].

## 2022-07-05 NOTE — Telephone Encounter (Signed)
Patient states that she is going for imaging tomorrow at [facility] and they told her on e of the ICD-10 codes is not a covered benefit. She wants to make sure before she has this appt tomorrow  please call

## 2022-07-06 DIAGNOSIS — H5589 Other irregular eye movements: Secondary | ICD-10-CM | POA: Diagnosis not present

## 2022-07-06 DIAGNOSIS — H519 Unspecified disorder of binocular movement: Secondary | ICD-10-CM | POA: Diagnosis not present

## 2022-07-09 ENCOUNTER — Telehealth: Payer: Self-pay | Admitting: Neurology

## 2022-07-09 DIAGNOSIS — H5500 Unspecified nystagmus: Secondary | ICD-10-CM

## 2022-07-09 NOTE — Telephone Encounter (Signed)
MRIs are unremarkable - no obvious cause for symptoms.  At this time, I think we need to check more labs looking for potential causes: ANA with ENA, B1, E, Mg, Zinc, Celiac panel, Lyme, RPR, HIV, GAD-65 antibodies, thyyroperoxidase antibodies, thyroglobulin antibodies, GQ1b antibody, paraneoplastic panel

## 2022-07-09 NOTE — Telephone Encounter (Signed)
MRIs are unremarkable - no obvious cause for symptoms.  At this time, I think we need to check more labs looking for potential causes: ANA with ENA, B1, E, Celiac panel, Lyme, RPR, HIV, GAD-65 antibodies, thyyroperoxidase antibodies, thyroglobulin antibodies, GQ1b antibody, paraneoplastic panel

## 2022-07-14 ENCOUNTER — Other Ambulatory Visit: Payer: Medicare Other

## 2022-07-14 DIAGNOSIS — R519 Headache, unspecified: Secondary | ICD-10-CM | POA: Diagnosis not present

## 2022-07-14 NOTE — Addendum Note (Signed)
Addended by: Leida Lauth on: 07/14/2022 09:52 AM   Modules accepted: Orders

## 2022-07-14 NOTE — Telephone Encounter (Signed)
Patient advised of lab orders and lab hours.    Information for the labs given

## 2022-07-20 ENCOUNTER — Ambulatory Visit: Payer: Medicare Other | Admitting: Neurology

## 2022-07-27 ENCOUNTER — Telehealth: Payer: Self-pay

## 2022-07-27 DIAGNOSIS — G894 Chronic pain syndrome: Secondary | ICD-10-CM | POA: Diagnosis not present

## 2022-07-27 DIAGNOSIS — G4700 Insomnia, unspecified: Secondary | ICD-10-CM | POA: Diagnosis not present

## 2022-07-27 DIAGNOSIS — M461 Sacroiliitis, not elsewhere classified: Secondary | ICD-10-CM | POA: Diagnosis not present

## 2022-07-27 DIAGNOSIS — M47816 Spondylosis without myelopathy or radiculopathy, lumbar region: Secondary | ICD-10-CM | POA: Diagnosis not present

## 2022-07-27 NOTE — Telephone Encounter (Signed)
Updated DX code sent to [facility] per request.  Christianne Dolin Syndrome

## 2022-07-29 DIAGNOSIS — M6289 Other specified disorders of muscle: Secondary | ICD-10-CM | POA: Diagnosis not present

## 2022-07-29 DIAGNOSIS — N398 Other specified disorders of urinary system: Secondary | ICD-10-CM | POA: Diagnosis not present

## 2022-08-10 DIAGNOSIS — G610 Guillain-Barre syndrome: Secondary | ICD-10-CM | POA: Diagnosis not present

## 2022-08-19 DIAGNOSIS — E559 Vitamin D deficiency, unspecified: Secondary | ICD-10-CM | POA: Diagnosis not present

## 2022-08-19 DIAGNOSIS — R7303 Prediabetes: Secondary | ICD-10-CM | POA: Diagnosis not present

## 2022-08-19 DIAGNOSIS — I10 Essential (primary) hypertension: Secondary | ICD-10-CM | POA: Diagnosis not present

## 2022-08-24 DIAGNOSIS — M47816 Spondylosis without myelopathy or radiculopathy, lumbar region: Secondary | ICD-10-CM | POA: Diagnosis not present

## 2022-08-24 DIAGNOSIS — M461 Sacroiliitis, not elsewhere classified: Secondary | ICD-10-CM | POA: Diagnosis not present

## 2022-08-24 DIAGNOSIS — G894 Chronic pain syndrome: Secondary | ICD-10-CM | POA: Diagnosis not present

## 2022-08-24 DIAGNOSIS — G4700 Insomnia, unspecified: Secondary | ICD-10-CM | POA: Diagnosis not present

## 2022-08-26 DIAGNOSIS — N1832 Chronic kidney disease, stage 3b: Secondary | ICD-10-CM | POA: Diagnosis not present

## 2022-08-26 DIAGNOSIS — K219 Gastro-esophageal reflux disease without esophagitis: Secondary | ICD-10-CM | POA: Diagnosis not present

## 2022-08-26 DIAGNOSIS — E559 Vitamin D deficiency, unspecified: Secondary | ICD-10-CM | POA: Diagnosis not present

## 2022-08-26 DIAGNOSIS — M810 Age-related osteoporosis without current pathological fracture: Secondary | ICD-10-CM | POA: Diagnosis not present

## 2022-08-26 DIAGNOSIS — I10 Essential (primary) hypertension: Secondary | ICD-10-CM | POA: Diagnosis not present

## 2022-09-06 NOTE — Progress Notes (Signed)
NEUROLOGY FOLLOW UP OFFICE NOTE  Carly Delacruz [id number]  Assessment/Plan:   Left sided new persistent headache, unclear etiology but consider cervicogenic and/or TMJ dysfunction Dysconjugate gaze Frequent falls - likely multifactorial related to underlying degenerative lumbar disease, arthritis, and polypharmacy.    For headache: continue on Depakote ER 250mg  at bedtime.  Will repeat CBC and hepatic panel.   Refer to PT for treatment of neck pain Advised to follow up with dentist for evaluation of TMJ dysfunction Check labs looking further for cause of dysconjugate gaze/falls:  ANA with ENA panel, B1, E, Zinc, Celiac panel, Lyme, RPR, HIV, thyyroperoxidase antibodies, thyroglobulin antibodies, myasthenia gravis panel and anti-MUSK Follow up for eye exam tomorrow Follow up with me in 6 months.     Subjective:  Carly Delacruz is a 57 year old female with chronic back pain, hypertension, depression, anxiety who follows up for headache and frequent falls.  UPDATE: Labs from February included normal CBC and CMP, B12 433, TSH 1.31, negative CRP and mildly elevated sed rate of 56.  MRI of brain and orbits with and without contrast on 07/06/2022 were unremarkable.  Ordered further labs to evaluate for disconjugate gaze/cerebellar dysfunction: anti-GQ1b negative, paraneoplastic/autoimmune panel (including GAD-65 antibodies).  However, other requested labs were not checked.  Referred to ophthalmology, which is scheduled for tomorrow.  Started on Depakote.  Overall improved.  Sometimes still has left sided facial/head pressure about 4-5 days a week.  She was ordered PT for neck pain but she cancelled because the neck pain improved.     HISTORY: She has longstanding history of frequent falls since around 2017.  When she stands up after sitting for a prolonged period of time, her legs feel numb and she cannot move her legs until she gets feeling back.  It takes about 4 minutes.  She may  fall while walking, usually her legs just give-out.  This is associated with pain and numbness in the legs.  She has chronic back pain with degenerative disease of the lumbar spine.  MRI lumbar spine report (imaging not available) from 05/16/17 demonstrated increased lordosis with mild degenerative disc disease causing "central, lateral, recess and foraminal stenosis at L3-L4 and L4-L5, worse at L4-L5" and also "foraminal narrowing at L5-S1 and minimal/mild lateral recess narrowing at L2-L3". She also has arthritis involving the knees.  She also has depression and anxiety.  She has had multiple changes in her medications, such as psychotropic and pain    On 12/24/2021, she woke up with new severe headache, dizziness and generalized weakness.  She was hospitalized and found to have AKI.  MRI of brain personally reviewed showed mild chronic small vessel ischemic changes but no acute intracranial abnormality.  MRA of head was negative for aneurysm, LVO, hemodynamically significant stenosis or other vascular abnormality.  She was treated but continued to have dizziness and headache since then.  The headache is persistent but fluctuates in severity from dull to severe.  She describes a pressure involving the entire right side of her face, most prominently involving the left eye.  It gets worse when she gets up too quickly and will dissipate after a few minutes when she sits back down to rest.  There is associated phonophobia and some photophobia.  Sometimes if she is watching TV or while driving, she will get double vision with one image above the other.  She reports nausea and sometimes vomiting, but that only started about 3 weeks ago.  Denies autonomic symptoms.  Sumatriptan ineffective.  Ubrelvy effective but not covered by her insurance.  Years ago, she had "ice cream" headaches but otherwise no history of headache.  She did see ENT for the headache and dizziness.  Inner ear etiology was detected but thought it may  have been cervicogenic or related to TMJ dysfunction.     She has pelvic floor dysfunction with history of increased urinary frequency and retention, requiring self-catheterization.  She is being worked up by urology.  PAST MEDICAL HISTORY: Past Medical History:  Diagnosis Date   Allergy    Anemia    Anxiety    Arthritis 12/10/03   Atrophic vaginitis 2009   BV (bacterial vaginosis) 2006   Chronic back pain    Chronic LLQ pain    Chronic post-traumatic stress disorder (PTSD)    Complication of anesthesia    Elevated LFTs    Endometriosis    Fatigue 2012   Gallstones    GERD (gastroesophageal reflux disease)    H/O bladder infections    H/O mumps    H/O varicella    History of candidal vulvovaginitis 03/21/03   HTN (hypertension)    Insomnia    Insomnia secondary to situational depression 06/06/07   Liver mass, right lobe 08/13/04   Nodule   Malaise 2012   Morbid obesity (HCC) 2005   Osteoporosis    Pelvic floor dysfunction    S/P gastric bypass    Severe episode of recurrent major depressive disorder, without psychotic features (HCC)    Sleep apnea 2005   SUI (stress urinary incontinence, female) 08/12/03   Trichomonas    Vitamin D deficiency    Vulvar lesion 03/21/03   Yeast infection     MEDICATIONS: Current Outpatient Medications on File Prior to Visit  Medication Sig Dispense Refill   atorvastatin (LIPITOR) 40 MG tablet Take 1 tablet (40 mg total) by mouth daily for cholesterol 90 tablet 3   atorvastatin (LIPITOR) 80 MG tablet TAKE 1 TABLET BY MOUTH ONCE DAILY (Patient taking differently: Take 80 mg by mouth daily.) 90 tablet 3   baclofen (LIORESAL) 10 MG tablet Take 0.5 tablets (5 mg total) by mouth 3 (three) times daily. 90 tablet 1   buprenorphine (BUTRANS) 5 MCG/HR PTWK Place onto the skin once a week.     carvedilol (COREG) 12.5 MG tablet TAKE 1 TABLET BY MOUTH TWO TIMES DAILY (Patient taking differently: Take 12.5 mg by mouth 2 (two) times daily.) 180 tablet 3    Cyanocobalamin (VITAMIN B-12) 2500 MCG SUBL Take 1 tablet by mouth daily.     diazepam (VALIUM) 5 MG tablet Insert 1 tablet (5 mg total) into the vagina every 8 hours as needed for pelvic floor spasm. (Patient taking differently: Take 5 mg by mouth every 8 (eight) hours as needed for anxiety.) 60 tablet 2   divalproex (DEPAKOTE ER) 250 MG 24 hr tablet Take 1 tablet (250 mg total) by mouth at bedtime. 30 tablet 5   famotidine (PEPCID) 20 MG tablet Take 1 tablet (20 mg total) by mouth daily for acid reflux. 90 tablet 5   FLUoxetine (PROZAC) 20 MG capsule Take 1 capsule (20 mg total) by mouth daily. 90 capsule 1   FLUoxetine (PROZAC) 40 MG capsule Take 1 capsule (40 mg total) by mouth daily. 90 capsule 0   fluticasone (FLONASE) 50 MCG/ACT nasal spray Place 1 spray into both nostrils daily. 16 g 2   furosemide (LASIX) 20 MG tablet Take 1 tablet (20 mg total) by mouth  daily as needed for fluid or edema. 90 tablet 3   gabapentin (NEURONTIN) 300 MG capsule Take 1 capsule (300 mg total) by mouth 3 (three) times daily. 360 capsule 0   gabapentin (NEURONTIN) 300 MG capsule Take 1 capsule (300 mg total) by mouth 3 (three) times daily. 270 capsule 0   hydrOXYzine (VISTARIL) 25 MG capsule Take 1 capsule by mouth three times a day (Patient taking differently: Take 25 mg by mouth every 8 (eight) hours as needed (For sleep per patient).) 270 capsule 0   hydrOXYzine (VISTARIL) 25 MG capsule Take 1 capsule (25 mg total) by mouth 3 (three) times daily. 270 capsule 0   omeprazole (PRILOSEC) 20 MG capsule Take 1 capsule by mouth once a day (Patient not taking: Reported on 12/24/2021) 90 capsule 3   potassium chloride (KLOR-CON M) 10 MEQ tablet Take 1 tablet (10 mEq total) by mouth daily. 90 tablet 3   predniSONE (DELTASONE) 50 MG tablet Take 1 tablet (50 mg total) by mouth once daily for 5 days. 5 tablet 0   QUEtiapine (SEROQUEL) 200 MG tablet Take 1 tablet (200 mg total) by mouth at bedtime. 90 tablet 1   QUEtiapine  (SEROQUEL) 200 MG tablet Take 1 tablet (200 mg total) by mouth once a day at bedtime. 90 tablet 3   sodium chloride (OCEAN) 0.65 % SOLN nasal spray Place 1 spray into both nostrils as needed for congestion.  0   SUMAtriptan (IMITREX) 100 MG tablet Take 1 tablet by mouth as needed, at least 2 hours between doses, max dose 2 tablets (200 mg) in 24 hours. 9 tablet 3   tiZANidine (ZANAFLEX) 2 MG tablet Take 1 tablet (2 mg total) by mouth 3 (three) times daily as needed. 90 tablet 0   traZODone (DESYREL) 150 MG tablet Take 1-2 tablets (150-300 mg total) by mouth at bedtime for sleep. 180 tablet 3   Turmeric POWD Take 1 each by mouth daily.     Ubrogepant (UBRELVY) 100 MG TABS Take 100 mg by mouth as needed. May take a second dose at least 2 hours after the first dose if needed 10 tablet 1   No current facility-administered medications on file prior to visit.    ALLERGIES: Allergies  Allergen Reactions   Morphine And Codeine Shortness Of Breath and Itching   Septra [Sulfamethoxazole-Trimethoprim] Anaphylaxis and Hives   Sulfa Antibiotics Anaphylaxis, Hives and Swelling    Throat swelling   Sulfamethoxazole-Trimethoprim Anaphylaxis and Hives    Other reaction(s): anaphylaxis   Sulfasalazine Anaphylaxis, Hives, Swelling and Other (See Comments)    Throat swelling   Ultram [Tramadol] Shortness Of Breath, Anxiety and Other (See Comments)    "panic attacks"    Ciprofloxacin Other (See Comments)    This medication was given along with a sulfa. Reaction is unknown at this time. Must list due to possible allergy.   Darvocet [Propoxyphene N-Acetaminophen] Itching   Lisinopril     Other reaction(s): cough   Adhesive [Tape] Rash   Codeine Itching and Other (See Comments)    No change when benadryl is given   Demerol [Meperidine] Nausea Only   Hydromorphone Itching and Other (See Comments)    No change when benadryl is given   Losartan Palpitations    FAMILY HISTORY: Family History  Problem  Relation Age of Onset   Diabetes Paternal Grandmother    Leukemia Maternal Grandmother    Arthritis Mother    High blood pressure Mother    Heart attack Mother  Prostate cancer Father    Cirrhosis Father    Colitis Sister    Hypertension Sister    Deep vein thrombosis Sister    Kidney disease Brother    Obstructive Sleep Apnea Brother    Colon polyps Neg Hx    Pancreatic cancer Neg Hx    Esophageal cancer Neg Hx    Stomach cancer Neg Hx       Objective:  Blood pressure 112/78, pulse 93, height 5\' 3"  (1.6 m), weight 253 lb (114.8 kg), SpO2 96 %. General: No acute distress.  Patient appears well-groomed.   Head:  Normocephalic/atraumatic, tenderness to palpation of both TMJ Eyes:  Fundi examined but not visualized Neck: supple, bilateral paraspinal tenderness, full range of motion Heart:  Regular rate and rhythm Lungs:  Clear to auscultation bilaterally Back: No paraspinal tenderness Neurological Exam: alert and oriented.  Speech fluent and not dysarthric, language intact.  Reduced left V2-V3.  Dysconjugate gaze particularly when traching up or to the left.  Otherwise, CN II-XII intact. Bulk and tone normal, muscle strength 5/5 throughout.  Sensation to light touch intact.  Deep tendon reflexes 2+ throughout, toes downgoing.  Broad-based antalgic gait.  Requires cane to ambulate.  Romberg negative.   Shon Millet, DO  CC: Irena Reichmann, DO

## 2022-09-07 ENCOUNTER — Other Ambulatory Visit: Payer: Medicare Other

## 2022-09-07 ENCOUNTER — Encounter: Payer: Self-pay | Admitting: Neurology

## 2022-09-07 ENCOUNTER — Other Ambulatory Visit (INDEPENDENT_AMBULATORY_CARE_PROVIDER_SITE_OTHER): Payer: Medicare Other

## 2022-09-07 ENCOUNTER — Ambulatory Visit (INDEPENDENT_AMBULATORY_CARE_PROVIDER_SITE_OTHER): Payer: Medicare Other | Admitting: Neurology

## 2022-09-07 VITALS — BP 112/78 | HR 93 | Ht 63.0 in | Wt 253.0 lb

## 2022-09-07 DIAGNOSIS — H518 Other specified disorders of binocular movement: Secondary | ICD-10-CM

## 2022-09-07 DIAGNOSIS — M542 Cervicalgia: Secondary | ICD-10-CM | POA: Diagnosis not present

## 2022-09-07 DIAGNOSIS — H5500 Unspecified nystagmus: Secondary | ICD-10-CM | POA: Diagnosis not present

## 2022-09-07 DIAGNOSIS — Z79899 Other long term (current) drug therapy: Secondary | ICD-10-CM

## 2022-09-07 DIAGNOSIS — R519 Headache, unspecified: Secondary | ICD-10-CM | POA: Diagnosis not present

## 2022-09-07 LAB — CBC
HCT: 39.5 % (ref 36.0–46.0)
Hemoglobin: 12.4 g/dL (ref 12.0–15.0)
MCHC: 31.4 g/dL (ref 30.0–36.0)
MCV: 85 fl (ref 78.0–100.0)
Platelets: 253 10*3/uL (ref 150.0–400.0)
RBC: 4.65 Mil/uL (ref 3.87–5.11)
RDW: 14.7 % (ref 11.5–15.5)
WBC: 5.6 10*3/uL (ref 4.0–10.5)

## 2022-09-07 LAB — HEPATIC FUNCTION PANEL
ALT: 30 U/L (ref 0–35)
AST: 30 U/L (ref 0–37)
Albumin: 4.3 g/dL (ref 3.5–5.2)
Alkaline Phosphatase: 96 U/L (ref 39–117)
Bilirubin, Direct: 0.1 mg/dL (ref 0.0–0.3)
Total Bilirubin: 0.5 mg/dL (ref 0.2–1.2)
Total Protein: 7.4 g/dL (ref 6.0–8.3)

## 2022-09-07 LAB — MAGNESIUM: Magnesium: 2.1 mg/dL (ref 1.5–2.5)

## 2022-09-07 NOTE — Patient Instructions (Addendum)
Continue divalproex 250mg  daily Check labs:  ANA, ENA panel, B1, E, Zinc, Celiac panel, Lyme, RPR, HIV, thyroperoxidase antibodies, thyroglobulin antibodies, myasthenia panel, anti-MUSK, CBC and hepatic panel Follow up with eye doctor tomorrow Consider seeing the dentist for evaluation of TMJ dysfunction. Follow up 6 months.

## 2022-09-08 DIAGNOSIS — H518 Other specified disorders of binocular movement: Secondary | ICD-10-CM | POA: Diagnosis not present

## 2022-09-08 LAB — LYME DISEASE, WESTERN BLOT

## 2022-09-08 LAB — ANA+ENA+DNA/DS+SCL 70+SJOSSA/B: dsDNA Ab: 2 IU/mL (ref 0–9)

## 2022-09-08 LAB — THYROGLOBULIN BY IMA: Thyroglobulin by IMA: 254.8 ng/mL — ABNORMAL HIGH (ref 1.5–38.5)

## 2022-09-09 LAB — ANA+ENA+DNA/DS+SCL 70+SJOSSA/B
ENA RNP Ab: 0.3 AI (ref 0.0–0.9)
ENA SM Ab Ser-aCnc: <0.2 AI (ref 0.0–0.9)
ENA SSA (RO) Ab: <0.2 AI (ref 0.0–0.9)
ENA SSB (LA) Ab: <0.2 AI (ref 0.0–0.9)
Scleroderma (Scl-70) (ENA) Antibody, IgG: <0.2 AI (ref 0.0–0.9)

## 2022-09-09 LAB — LYME DISEASE, WESTERN BLOT
IgG P18 Ab.: ABSENT
IgG P28 Ab.: ABSENT
IgG P30 Ab.: ABSENT
IgG P45 Ab.: ABSENT
IgG P58 Ab.: ABSENT
IgG P93 Ab.: ABSENT
IgM P23 Ab.: ABSENT

## 2022-09-09 LAB — TGAB+THYROGLOBULIN IMA OR LCMS: Thyroglobulin Antibody: <1 IU/mL (ref 0.0–0.9)

## 2022-09-13 ENCOUNTER — Encounter: Payer: Self-pay | Admitting: Neurology

## 2022-09-14 ENCOUNTER — Telehealth: Payer: Self-pay

## 2022-09-14 DIAGNOSIS — H518 Other specified disorders of binocular movement: Secondary | ICD-10-CM | POA: Diagnosis not present

## 2022-09-14 LAB — VITAMIN E
Gamma-Tocopherol (Vit E): 1.6 mg/L (ref <4.4)
Vitamin E (Alpha Tocopherol): 8.2 mg/L (ref 5.7–19.9)

## 2022-09-14 LAB — HIV ANTIBODY (ROUTINE TESTING W REFLEX): HIV 1&2 Ab, 4th Generation: NONREACTIVE

## 2022-09-14 LAB — CELIAC DISEASE COMPREHENSIVE PANEL WITH REFLEXES
(tTG) Ab, IgA: <1 U/mL
Immunoglobulin A: 70 mg/dL (ref 47–310)

## 2022-09-14 LAB — VITAMIN B1: Vitamin B1 (Thiamine): 12 nmol/L (ref 8–30)

## 2022-09-14 LAB — RPR: RPR Ser Ql: NONREACTIVE

## 2022-09-14 LAB — ZINC: Zinc: 79 ug/dL (ref 60–130)

## 2022-09-14 NOTE — Telephone Encounter (Signed)
Patient advised the lab TPO wasn't drawn and we do need it. If she could go by [facility] on [address] to have that drawn. Lab order added.

## 2022-09-15 LAB — THYROID PEROXIDASE ANTIBODIES (TPO) (REFL): Thyroperoxidase Ab SerPl-aCnc: 2 IU/mL (ref <9)

## 2022-09-22 DIAGNOSIS — G4700 Insomnia, unspecified: Secondary | ICD-10-CM | POA: Diagnosis not present

## 2022-09-22 DIAGNOSIS — Z79891 Long term (current) use of opiate analgesic: Secondary | ICD-10-CM | POA: Diagnosis not present

## 2022-09-22 DIAGNOSIS — M47816 Spondylosis without myelopathy or radiculopathy, lumbar region: Secondary | ICD-10-CM | POA: Diagnosis not present

## 2022-09-22 DIAGNOSIS — M461 Sacroiliitis, not elsewhere classified: Secondary | ICD-10-CM | POA: Diagnosis not present

## 2022-09-22 DIAGNOSIS — G894 Chronic pain syndrome: Secondary | ICD-10-CM | POA: Diagnosis not present

## 2022-09-30 ENCOUNTER — Encounter: Payer: Self-pay | Admitting: Anesthesiology

## 2022-10-01 DIAGNOSIS — R7303 Prediabetes: Secondary | ICD-10-CM | POA: Diagnosis not present

## 2022-10-01 DIAGNOSIS — I129 Hypertensive chronic kidney disease with stage 1 through stage 4 chronic kidney disease, or unspecified chronic kidney disease: Secondary | ICD-10-CM | POA: Diagnosis not present

## 2022-10-01 DIAGNOSIS — Z87448 Personal history of other diseases of urinary system: Secondary | ICD-10-CM | POA: Diagnosis not present

## 2022-10-01 DIAGNOSIS — R39198 Other difficulties with micturition: Secondary | ICD-10-CM | POA: Diagnosis not present

## 2022-10-01 DIAGNOSIS — N1831 Chronic kidney disease, stage 3a: Secondary | ICD-10-CM | POA: Diagnosis not present

## 2022-10-04 DIAGNOSIS — H524 Presbyopia: Secondary | ICD-10-CM | POA: Diagnosis not present

## 2022-10-04 DIAGNOSIS — H5005 Alternating esotropia: Secondary | ICD-10-CM | POA: Diagnosis not present

## 2022-10-04 DIAGNOSIS — H5111 Convergence insufficiency: Secondary | ICD-10-CM | POA: Diagnosis not present

## 2022-10-09 ENCOUNTER — Other Ambulatory Visit: Payer: Self-pay | Admitting: Neurology

## 2022-10-20 DIAGNOSIS — G894 Chronic pain syndrome: Secondary | ICD-10-CM | POA: Diagnosis not present

## 2022-10-20 DIAGNOSIS — M47816 Spondylosis without myelopathy or radiculopathy, lumbar region: Secondary | ICD-10-CM | POA: Diagnosis not present

## 2022-10-20 DIAGNOSIS — M461 Sacroiliitis, not elsewhere classified: Secondary | ICD-10-CM | POA: Diagnosis not present

## 2022-10-20 DIAGNOSIS — G4700 Insomnia, unspecified: Secondary | ICD-10-CM | POA: Diagnosis not present

## 2022-10-28 DIAGNOSIS — M7061 Trochanteric bursitis, right hip: Secondary | ICD-10-CM | POA: Diagnosis not present

## 2022-11-02 ENCOUNTER — Encounter (HOSPITAL_BASED_OUTPATIENT_CLINIC_OR_DEPARTMENT_OTHER): Payer: Self-pay | Admitting: Family

## 2022-11-02 ENCOUNTER — Ambulatory Visit (INDEPENDENT_AMBULATORY_CARE_PROVIDER_SITE_OTHER): Payer: Medicare Other | Admitting: Family

## 2022-11-02 VITALS — BP 128/88 | HR 74 | Ht 63.0 in | Wt 257.0 lb

## 2022-11-02 DIAGNOSIS — I25118 Atherosclerotic heart disease of native coronary artery with other forms of angina pectoris: Secondary | ICD-10-CM | POA: Diagnosis not present

## 2022-11-02 DIAGNOSIS — E785 Hyperlipidemia, unspecified: Secondary | ICD-10-CM

## 2022-11-02 DIAGNOSIS — E876 Hypokalemia: Secondary | ICD-10-CM

## 2022-11-02 DIAGNOSIS — R002 Palpitations: Secondary | ICD-10-CM

## 2022-11-02 DIAGNOSIS — I10 Essential (primary) hypertension: Secondary | ICD-10-CM | POA: Diagnosis not present

## 2022-11-02 MED ORDER — POTASSIUM CHLORIDE CRYS ER 10 MEQ PO TBCR
10.0000 meq | EXTENDED_RELEASE_TABLET | ORAL | 2 refills | Status: DC | PRN
Start: 2022-11-02 — End: 2023-02-09

## 2022-11-02 NOTE — Progress Notes (Signed)
Cardiology Office Note:  .   Date:  11/02/2022  ID:  Carly Delacruz, DOB June 29, 1965, MRN [id number] PCP: Irena Reichmann, DO  [facility] Providers Cardiologist:  Nanetta Batty, MD    History of Present Illness: .   Carly Delacruz is a 57 y.o. female hypertension, sleep apnea (not on CPAP after bypass surgery weight loss), hyperlipidemia, GERD, gastric bypass, gr1DD, coronary artery disease.   Prior CTA 03/2018 coronary calcium score of 18 placing her in the 87th percentile for age and gender.   At visit 10/2019 chlorthalidone added for BP control.  Last seen 03/05/2021 by Eligha Bridegroom, NP.  She noted occasional palpitations but as they were infrequent ZIO monitor deferred.  Chlorthalidone discontinued due to AKI 11/2021 during hospitalization due to weakness felt to be secondary to gabapentin and Baclofex toxicity in setting of AKI.  Presents today for follow up. Exercising by walking and going to water classes. Notes after her October hospitalization her Carvedilol was split in half. Then felt woozy, dizzy and her PCP's office discontinued her Carvedilol. Reports no shortness of breath nor dyspnea on exertion. Reports no chest pain, pressure, or tightness. No edema, orthopnea, PND. Reports no palpitations.    Previous antihypertensives Coreg Chlorthalidone  ROS: Please see the history of present illness.    All other systems reviewed and are negative.   Studies Reviewed: Marland Kitchen   EKG Interpretation Date/Time:  Tuesday November 02 2022 08:18:23 EDT Ventricular Rate:  74 PR Interval:  162 QRS Duration:  84 QT Interval:  390 QTC Calculation: 432 R Axis:   -17  Text Interpretation: Normal sinus rhythm Confirmed by Gillian Shields ([id number]) on 11/02/2022 8:26:25 AM    Cardiac Studies & Procedures       ECHOCARDIOGRAM  ECHOCARDIOGRAM COMPLETE 12/27/2017  Narrative *[facility]* [address] [place], [place]  [address] [phone]  ------------------------------------------------------------------- Transthoracic Echocardiography  Patient:    Kemberley, Lucci MR #:       [id number] Study Date: 12/27/2017 Gender:     F Age:        52 Height:     160 cm Weight:     111.6 kg BSA:        2.29 m^2 Pt. Status: Room:  ATTENDING    Nanetta Batty, MD ORDERING     Nanetta Batty, MD REFERRING    Nanetta Batty, MD PERFORMING   [facility], Outpatient SONOGRAPHER  [facility], RDCS  cc:  ------------------------------------------------------------------- LV EF: 60% -   65%  ------------------------------------------------------------------- Indications:      Hypertension (I10).  ------------------------------------------------------------------- History:   PMH:  Edema, Obesity, Obstructive Sleep Apnea  Chest pain.  Dyspnea.  Risk factors:  Hypertension.  ------------------------------------------------------------------- Study Conclusions  - Left ventricle: The cavity size was normal. Wall thickness was normal. Systolic function was normal. The estimated ejection fraction was in the range of 60% to 65%. Wall motion was normal; there were no regional wall motion abnormalities. Doppler parameters are consistent with abnormal left ventricular relaxation (grade 1 diastolic dysfunction). - Pulmonary arteries: Systolic pressure was mildly increased. PA peak pressure: 34 mm Hg (S).  ------------------------------------------------------------------- Study data:  No prior study was available for comparison.  Study status:  Routine.  Procedure:  Transthoracic echocardiography. Image quality was adequate.          Transthoracic echocardiography.  M-mode, complete 2D, spectral Doppler, and color Doppler.  Birthdate:  Patient birthdate: 04/29/1965.  Age:  Patient is 57 yr old.  Sex:  Gender: female.  BMI: 43.6 kg/m^2.  Blood pressure:     134/98  Patient status:  Outpatient.  Study  date: Study date: 12/27/2017. Study time: 07:38 AM.  Location:  [facility]  -------------------------------------------------------------------  ------------------------------------------------------------------- Left ventricle:  The cavity size was normal. Wall thickness was normal. Systolic function was normal. The estimated ejection fraction was in the range of 60% to 65%. Wall motion was normal; there were no regional wall motion abnormalities. Doppler parameters are consistent with abnormal left ventricular relaxation (grade 1 diastolic dysfunction).  ------------------------------------------------------------------- Aortic valve:   Structurally normal valve.   Cusp separation was normal.  Doppler:  Transvalvular velocity was within the normal range. There was no stenosis. There was no regurgitation.  ------------------------------------------------------------------- Aorta:  Aortic root: The aortic root was normal in size. Ascending aorta: The ascending aorta was normal in size.  ------------------------------------------------------------------- Mitral valve:   Structurally normal valve.   Leaflet separation was normal.  Doppler:  Transvalvular velocity was within the normal range. There was no evidence for stenosis. There was trivial regurgitation.    Peak gradient (D): 3 mm Hg.  ------------------------------------------------------------------- Left atrium:  The atrium was normal in size.  ------------------------------------------------------------------- Right ventricle:  The cavity size was normal. Systolic function was normal.  ------------------------------------------------------------------- Pulmonic valve:    The valve appears to be grossly normal. Doppler:  There was no significant regurgitation.  ------------------------------------------------------------------- Tricuspid valve:   The valve appears to be grossly normal. Doppler:  There was mild  regurgitation.  ------------------------------------------------------------------- Pulmonary artery:   Systolic pressure was mildly increased.  ------------------------------------------------------------------- Right atrium:  The atrium was normal in size.  ------------------------------------------------------------------- Pericardium:  There was no pericardial effusion.  ------------------------------------------------------------------- Systemic veins: Inferior vena cava: The vessel was normal in size. The respirophasic diameter changes were in the normal range (>= 50%), consistent with normal central venous pressure.  ------------------------------------------------------------------- Measurements  Left ventricle                         Value        Reference LV ID, ED, PLAX chordal        (L)     36.8  mm     43 - 52 LV ID, ES, PLAX chordal        (L)     16    mm     23 - 38 LV fx shortening, PLAX chordal         57    %      >=29 LV PW thickness, ED                    8.36  mm     ---------- IVS/LV PW ratio, ED                    1.17         <=1.3 Stroke volume, 2D                      70    ml     ---------- Stroke volume/bsa, 2D                  31    ml/m^2 ---------- LV e&', lateral                         9.57  cm/s   ---------- LV E/e&', lateral  8.8          ---------- LV e&', medial                          7.94  cm/s   ---------- LV E/e&', medial                        10.6         ---------- LV e&', average                         8.76  cm/s   ---------- LV E/e&', average                       9.62         ----------  Ventricular septum                     Value        Reference IVS thickness, ED                      9.81  mm     ----------  LVOT                                   Value        Reference LVOT ID, S                             21    mm     ---------- LVOT area                              3.46  cm^2   ---------- LVOT peak  velocity, S                  99.2  cm/s   ---------- LVOT mean velocity, S                  63.7  cm/s   ---------- LVOT VTI, S                            20.3  cm     ----------  Aorta                                  Value        Reference Aortic root ID, ED                     30    mm     ---------- Ascending aorta ID, A-P, S             34    mm     ----------  Left atrium                            Value        Reference LA ID, A-P, ES  36    mm     ---------- LA ID/bsa, A-P                         1.57  cm/m^2 <=2.2 LA volume, S                           38.4  ml     ---------- LA volume/bsa, S                       16.8  ml/m^2 ---------- LA volume, ES, 1-p A4C                 25.9  ml     ---------- LA volume/bsa, ES, 1-p A4C             11.3  ml/m^2 ---------- LA volume, ES, 1-p A2C                 46.8  ml     ---------- LA volume/bsa, ES, 1-p A2C             20.4  ml/m^2 ----------  Mitral valve                           Value        Reference Mitral E-wave peak velocity            84.2  cm/s   ---------- Mitral A-wave peak velocity            102   cm/s   ---------- Mitral deceleration time       (H)     243   ms     150 - 230 Mitral peak gradient, D                3     mm Hg  ---------- Mitral E/A ratio, peak                 0.9          ----------  Pulmonary arteries                     Value        Reference PA pressure, S, DP             (H)     34    mm Hg  <=30  Tricuspid valve                        Value        Reference Tricuspid regurg peak velocity         278   cm/s   ---------- Tricuspid peak RV-RA gradient          31    mm Hg  ----------  Right atrium                           Value        Reference RA ID, S-I, ES, A4C                    44.9  mm     34 - 49 RA area, ES, A4C  11.2  cm^2   8.3 - 19.5 RA volume, ES, A/L                     23    ml     ---------- RA volume/bsa, ES, A/L                 10     ml/m^2 ----------  Systemic veins                         Value        Reference Estimated CVP                          3     mm Hg  ----------  Right ventricle                        Value        Reference TAPSE                                  22    mm     ---------- RV pressure, S, DP             (H)     34    mm Hg  <=30 RV s&', lateral, S                      11.3  cm/s   ----------  Legend: (L)  and  (H)  mark values outside specified reference range.  ------------------------------------------------------------------- Prepared and Electronically Authenticated by  Kristeen Miss, M.D. 2019-10-29T11:31:10     CT SCANS  CT CORONARY MORPH W/CTA COR W/SCORE 03/21/2018  Addendum 03/21/2018  5:02 PM ADDENDUM REPORT: 03/21/2018 16:59  CLINICAL DATA:  Chest pain  EXAM: Cardiac CTA  MEDICATIONS: Sub lingual nitro. 4mg  x 2  TECHNIQUE: The patient was scanned on a Siemens 192 slice scanner. Gantry rotation speed was 250 msecs. Collimation was 0.6 mm. A 100 kV prospective scan was triggered in the ascending thoracic aorta at 35-75% of the R-R interval. Average HR during the scan was 60 bpm. The 3D data set was interpreted on a dedicated work station using MPR, MIP and VRT modes. A total of 80cc of contrast was used.  FINDINGS: Non-cardiac: See separate report from [facility].  Pulmonary veins drain normally to the left atrium.  Calcium Score: 18 Agatston units.  Coronary Arteries: Right dominant with no anomalies  LM: No plaque or stenosis.  LAD system: Mixed plaque ostial LAD, suspect mild (<50%) stenosis.  Circumflex system: No plaque or stenosis.  RCA system: No plaque or stenosis.  IMPRESSION: 1. Coronary artery calcium score 18. This places the patient in the 87th percentile for age and gender, suggesting high risk for future cardiac events.  2.  Mild proximal LAD stenosis.  Will send FFR to confirm.  Dalton Mclean   Electronically  Signed By: Marca Ancona M.D. On: 03/21/2018 16:59  Narrative EXAM: OVER-READ INTERPRETATION  CT CHEST  The following report is an over-read performed by radiologist Dr. Genevive Bi of [facility] on 03/21/2018. This over-read does not include interpretation of cardiac or coronary anatomy or pathology. The coronary CTA interpretation by the cardiologist is attached.  COMPARISON:  None.  FINDINGS: Limited view of the lung  parenchyma demonstrates the RIGHT basilar atelectasis. Airways are normal.  Limited view of the mediastinum demonstrates no adenopathy. Esophagus normal.  Limited view of the upper abdomen unremarkable.  Limited view of the skeleton and chest wall is unremarkable.  IMPRESSION: RIGHT basilar atelectasis.  Electronically Signed: By: Genevive Bi M.D. On: 03/21/2018 11:57          Risk Assessment/Calculations:             Physical Exam:   VS:  BP 128/88   Pulse 74   Ht 5\' 3"  (1.6 m)   Wt 257 lb (116.6 kg)   SpO2 95%   BMI 45.53 kg/m    Wt Readings from Last 3 Encounters:  11/02/22 257 lb (116.6 kg)  09/07/22 253 lb (114.8 kg)  04/27/22 247 lb (112 kg)    GEN: Well nourished, well developed in no acute distress NECK: No JVD; No carotid bruits CARDIAC: RRR, no murmurs, rubs, gallops RESPIRATORY:  Clear to auscultation without rales, wheezing or rhonchi  ABDOMEN: Soft, non-tender, non-distended EXTREMITIES:  No edema; No deformity   ASSESSMENT AND PLAN: .    HTN - BP elevated in clinic 134/92 with repeat 128/88. Continue Amlodipine 5mg  daily.  Chlorthalidone previously discontinued due to AKI.  Carvedilol discontinued due to hypotension. Discussed to monitor BP at home at least 2 hours after medications and sitting for 5-10 minutes.   Grade 1 diastolic dysfunction - By echo 11/2017. Euvolemic and well compensated on exam. Continue PRN Lasix. Has not needed recently.   CAD - Stable with no anginal symptoms. No indication  for ischemic evaluation.  GDMT Atorvastatin.   Hypokalemia - 04/27/22 K 4.0. Continue potassium supplement. Improved since discontinuation of Chlorthalidone. Taking potassium supplement only PRN when she takes Lasix. Has not needed Lasix in swelling for >1 month.   CKD IIIB - 05/2022 creatinine 1.09. Follows with Dr. Malen Gauze.  HLD, LDL goal <70 - Continue Atorvastatin per PCP.        Dispo: follow up in 1 year  Signed, Alver Sorrow, NP

## 2022-11-02 NOTE — Patient Instructions (Signed)
Medication Instructions:  Continue your current medications.   *If you need a refill on your cardiac medications before your next appointment, please call your pharmacy*  Testing/Procedures: Your EKG looked great today.    Follow-Up: At [facility], you and your health needs are our priority.  As part of our continuing mission to provide you with exceptional heart care, we have created designated Provider Care Teams.  These Care Teams include your primary Cardiologist (physician) and Advanced Practice Providers (APPs -  Physician Assistants and Nurse Practitioners) who all work together to provide you with the care you need, when you need it.  We recommend signing up for the patient portal called "MyChart".  Sign up information is provided on this After Visit Summary.  MyChart is used to connect with patients for Virtual Visits (Telemedicine).  Patients are able to view lab/test results, encounter notes, upcoming appointments, etc.  Non-urgent messages can be sent to your provider as well.   To learn more about what you can do with MyChart, go to [email or web address].    Your next appointment:   1 year(s)  Provider:   Nanetta Batty, MD  or Alver Sorrow, NP    Other Instructions  Let us know if you want a referral to PREP:

## 2022-11-19 DIAGNOSIS — Z79899 Other long term (current) drug therapy: Secondary | ICD-10-CM | POA: Diagnosis not present

## 2022-11-19 DIAGNOSIS — R946 Abnormal results of thyroid function studies: Secondary | ICD-10-CM | POA: Diagnosis not present

## 2022-11-19 DIAGNOSIS — Z13220 Encounter for screening for lipoid disorders: Secondary | ICD-10-CM | POA: Diagnosis not present

## 2022-11-19 DIAGNOSIS — E559 Vitamin D deficiency, unspecified: Secondary | ICD-10-CM | POA: Diagnosis not present

## 2022-11-19 DIAGNOSIS — R7309 Other abnormal glucose: Secondary | ICD-10-CM | POA: Diagnosis not present

## 2022-11-24 ENCOUNTER — Other Ambulatory Visit (HOSPITAL_COMMUNITY): Payer: Self-pay

## 2022-11-24 DIAGNOSIS — R1011 Right upper quadrant pain: Secondary | ICD-10-CM | POA: Diagnosis not present

## 2022-11-24 MED ORDER — ESOMEPRAZOLE MAGNESIUM 40 MG PO CPDR
40.0000 mg | DELAYED_RELEASE_CAPSULE | Freq: Every day | ORAL | 1 refills | Status: DC
  Filled 2022-11-24 – 2022-12-06 (×2): qty 30, 30d supply, fill #0

## 2022-12-06 ENCOUNTER — Other Ambulatory Visit (HOSPITAL_COMMUNITY): Payer: Self-pay

## 2022-12-15 DIAGNOSIS — M461 Sacroiliitis, not elsewhere classified: Secondary | ICD-10-CM | POA: Diagnosis not present

## 2022-12-15 DIAGNOSIS — G894 Chronic pain syndrome: Secondary | ICD-10-CM | POA: Diagnosis not present

## 2022-12-15 DIAGNOSIS — M47816 Spondylosis without myelopathy or radiculopathy, lumbar region: Secondary | ICD-10-CM | POA: Diagnosis not present

## 2022-12-15 DIAGNOSIS — G4700 Insomnia, unspecified: Secondary | ICD-10-CM | POA: Diagnosis not present

## 2022-12-16 ENCOUNTER — Other Ambulatory Visit (HOSPITAL_COMMUNITY): Payer: Self-pay

## 2023-01-12 DIAGNOSIS — M6289 Other specified disorders of muscle: Secondary | ICD-10-CM | POA: Diagnosis not present

## 2023-01-12 DIAGNOSIS — N398 Other specified disorders of urinary system: Secondary | ICD-10-CM | POA: Diagnosis not present

## 2023-02-03 DIAGNOSIS — R339 Retention of urine, unspecified: Secondary | ICD-10-CM | POA: Diagnosis not present

## 2023-02-03 DIAGNOSIS — M6289 Other specified disorders of muscle: Secondary | ICD-10-CM | POA: Diagnosis not present

## 2023-02-08 ENCOUNTER — Other Ambulatory Visit (HOSPITAL_BASED_OUTPATIENT_CLINIC_OR_DEPARTMENT_OTHER): Payer: Self-pay | Admitting: Family

## 2023-02-08 DIAGNOSIS — E876 Hypokalemia: Secondary | ICD-10-CM

## 2023-02-09 DIAGNOSIS — G4700 Insomnia, unspecified: Secondary | ICD-10-CM | POA: Diagnosis not present

## 2023-02-09 DIAGNOSIS — G894 Chronic pain syndrome: Secondary | ICD-10-CM | POA: Diagnosis not present

## 2023-02-09 DIAGNOSIS — M47816 Spondylosis without myelopathy or radiculopathy, lumbar region: Secondary | ICD-10-CM | POA: Diagnosis not present

## 2023-02-09 DIAGNOSIS — M461 Sacroiliitis, not elsewhere classified: Secondary | ICD-10-CM | POA: Diagnosis not present

## 2023-03-05 DIAGNOSIS — M1712 Unilateral primary osteoarthritis, left knee: Secondary | ICD-10-CM | POA: Diagnosis not present

## 2023-03-09 DIAGNOSIS — M810 Age-related osteoporosis without current pathological fracture: Secondary | ICD-10-CM | POA: Diagnosis not present

## 2023-03-09 DIAGNOSIS — R7303 Prediabetes: Secondary | ICD-10-CM | POA: Diagnosis not present

## 2023-03-10 ENCOUNTER — Ambulatory Visit: Payer: Medicare Other | Admitting: Neurology

## 2023-03-21 NOTE — Progress Notes (Unsigned)
Virtual Visit via Video Note  Consent was obtained for video visit:  Yes.   Answered questions that patient had about telehealth interaction:  Yes.   I discussed the limitations, risks, security and privacy concerns of performing an evaluation and management service by telemedicine. I also discussed with the patient that there may be a patient responsible charge related to this service. The patient expressed understanding and agreed to proceed.  Pt location: Home Physician Location: office Name of referring provider:  Irena Reichmann, DO I connected with Carly Delacruz at patients initiation/request on 03/22/2023 at 10:50 AM EST by video enabled telemedicine application and verified that I am speaking with the correct person using two identifiers. Pt MRN:  [id number] Pt DOB:  08-24-1965 Video Participants:  Carly Delacruz   Assessment/Plan:   Left sided new persistent headache, unclear etiology but consider cervicogenic and/or TMJ dysfunction Convergence insufficiency, alternating esotropia Frequent falls - likely multifactorial related to underlying degenerative lumbar disease, arthritis, and polypharmacy.    For headache: continue on Depakote ER 250mg  at bedtime.  Will repeat CBC and hepatic panel.   Refer to PT for treatment of neck pain Advised to follow up with dentist for evaluation of TMJ dysfunction Follow up in 6 months.     Subjective:  Carly Delacruz is a 58 year old female with chronic back pain, hypertension, depression, anxiety who follows up for headache and frequent falls.  UPDATE: Underwent further lab testing in July to evaluate for disconjugate gaze/cerebellar dysfunction: negative ANA, negative ENA panel, Mg 2.1, B1 12, E 8.2, zinc 79, RPR nonreactive, HIV nonreactive, negative Lyme, negative Celiac panel, thyroperoxidase antibody 2, negative thyroglobulin antibody, elevated thyroglobulin 254.8.  Saw ophthalmology in August, who diagnosed convergence  insufficiency and alternating esotropia.  Provided instructions for pencil push ups and may consider Prism in the future if no improvement.    Migraines are overall improved  Sometimes still has left sided facial/head pressure about 3 days a week.  She was ordered PT for neck pain but she cancelled because the neck pain improved.  Unable to find a dentist or periodontist for evaluation of TMJ dysfunction.   HISTORY: She has longstanding history of frequent falls since around 2017.  When she stands up after sitting for a prolonged period of time, her legs feel numb and she cannot move her legs until she gets feeling back.  It takes about 4 minutes.  She may fall while walking, usually her legs just give-out.  This is associated with pain and numbness in the legs.  She has chronic back pain with degenerative disease of the lumbar spine.  MRI lumbar spine report (imaging not available) from 05/16/17 demonstrated increased lordosis with mild degenerative disc disease causing "central, lateral, recess and foraminal stenosis at L3-L4 and L4-L5, worse at L4-L5" and also "foraminal narrowing at L5-S1 and minimal/mild lateral recess narrowing at L2-L3". She also has arthritis involving the knees.  She also has depression and anxiety.  She has had multiple changes in her medications, such as psychotropic and pain    On 12/24/2021, she woke up with new severe headache, dizziness and generalized weakness.  She was hospitalized and found to have AKI.  MRI of brain personally reviewed showed mild chronic small vessel ischemic changes but no acute intracranial abnormality.  MRA of head was negative for aneurysm, LVO, hemodynamically significant stenosis or other vascular abnormality.  She was treated but continued to have dizziness and headache since then.  The headache  is persistent but fluctuates in severity from dull to severe.  She describes a pressure involving the entire right side of her face, most prominently  involving the left eye.  It gets worse when she gets up too quickly and will dissipate after a few minutes when she sits back down to rest.  There is associated phonophobia and some photophobia.  Sometimes if she is watching TV or while driving, she will get double vision with one image above the other.  She reports nausea and sometimes vomiting, but that only started about 3 weeks ago.  Denies autonomic symptoms.  Sumatriptan ineffective.  Ubrelvy effective but not covered by her insurance.  Years ago, she had "ice cream" headaches but otherwise no history of headache.  She did see ENT for the headache and dizziness.  Inner ear etiology was detected but thought it may have been cervicogenic or related to TMJ dysfunction.    Labs from February 2024 included normal CBC and CMP, B12 433, TSH 1.31, negative CRP, mildly elevated sed rate of 56, negative anti-GQ1b, negative paraneoplastic/autoimmune panel (including GAD-65 antibodies).  MRI of brain and orbits with and without contrast on 07/06/2022 were unremarkable.   She has pelvic floor dysfunction with history of increased urinary frequency and retention, requiring self-catheterization.  She is being worked up by urology.   Past Medical History: Past Medical History:  Diagnosis Date   Allergy    Anemia    Anxiety    Arthritis 12/10/03   Atrophic vaginitis 2009   BV (bacterial vaginosis) 2006   Chronic back pain    Chronic LLQ pain    Chronic post-traumatic stress disorder (PTSD)    Complication of anesthesia    Elevated LFTs    Endometriosis    Fatigue 2012   Gallstones    GERD (gastroesophageal reflux disease)    H/O bladder infections    H/O mumps    H/O varicella    History of candidal vulvovaginitis 03/21/03   HTN (hypertension)    Insomnia    Insomnia secondary to situational depression 06/06/07   Liver mass, right lobe 08/13/04   Nodule   Malaise 2012   Morbid obesity (HCC) 2005   Osteoporosis    Pelvic floor dysfunction    S/P  gastric bypass    Severe episode of recurrent major depressive disorder, without psychotic features (HCC)    Sleep apnea 2005   SUI (stress urinary incontinence, female) 08/12/03   Trichomonas    Vitamin D deficiency    Vulvar lesion 03/21/03   Yeast infection     Medications: Outpatient Encounter Medications as of 03/22/2023  Medication Sig Note   amLODipine (NORVASC) 5 MG tablet Take 5 mg by mouth daily.    atorvastatin (LIPITOR) 40 MG tablet Take 1 tablet (40 mg total) by mouth daily for cholesterol    buprenorphine (BUTRANS) 5 MCG/HR PTWK Place onto the skin once a week. 12/24/2021: Patient states she is still taking 5 mg  Usually apply on Wednesdays   diazepam (VALIUM) 5 MG tablet Insert 1 tablet (5 mg total) into the vagina every 8 hours as needed for pelvic floor spasm.    divalproex (DEPAKOTE ER) 250 MG 24 hr tablet TAKE 1 TABLET BY MOUTH AT BEDTIME    esomeprazole (NEXIUM) 40 MG capsule Take 1 capsule (40 mg total) by mouth daily.    famotidine (PEPCID) 20 MG tablet Take 1 tablet (20 mg total) by mouth daily for acid reflux.    FLUoxetine (PROZAC) 20  MG capsule Take 1 capsule (20 mg total) by mouth daily. 12/24/2021: Take along with the 40 mg tablet for total of 60 mg daily per patient   FLUoxetine (PROZAC) 40 MG capsule Take 1 capsule (40 mg total) by mouth daily.    fluticasone (FLONASE) 50 MCG/ACT nasal spray Place 1 spray into both nostrils daily.    furosemide (LASIX) 20 MG tablet Take 1 tablet (20 mg total) by mouth daily as needed for fluid or edema.    gabapentin (NEURONTIN) 100 MG capsule Take 200 mg by mouth 4 (four) times daily.    hydrOXYzine (VISTARIL) 25 MG capsule Take 1 capsule by mouth three times a day (Patient taking differently: Take 25 mg by mouth every 8 (eight) hours as needed (For sleep per patient).)    hydrOXYzine (VISTARIL) 25 MG capsule Take 1 capsule (25 mg total) by mouth 3 (three) times daily.    potassium chloride (KLOR-CON M) 10 MEQ tablet TAKE 1  TABLET BY MOUTH AS NEEDED(TAKE WITH FUROSEMIDE)    QUEtiapine (SEROQUEL) 200 MG tablet Take 1 tablet (200 mg total) by mouth once a day at bedtime.    QUEtiapine (SEROQUEL) 300 MG tablet Take 300 mg by mouth at bedtime.    QUEtiapine Fumarate 150 MG TABS Take 1 tablet by mouth daily.    sodium chloride (OCEAN) 0.65 % SOLN nasal spray Place 1 spray into both nostrils as needed for congestion.    tiZANidine (ZANAFLEX) 2 MG tablet Take 1 tablet (2 mg total) by mouth 3 (three) times daily as needed.    Turmeric POWD Take 1 each by mouth daily.    No facility-administered encounter medications on file as of 03/22/2023.    Allergies: Allergies  Allergen Reactions   Morphine And Codeine Shortness Of Breath and Itching   Septra [Sulfamethoxazole-Trimethoprim] Anaphylaxis and Hives   Sulfa Antibiotics Anaphylaxis, Hives and Swelling    Throat swelling   Sulfamethoxazole-Trimethoprim Anaphylaxis and Hives    Other reaction(s): anaphylaxis   Sulfasalazine Anaphylaxis, Hives, Swelling and Other (See Comments)    Throat swelling   Ultram [Tramadol] Shortness Of Breath, Anxiety and Other (See Comments)    "panic attacks"    Ciprofloxacin Other (See Comments)    This medication was given along with a sulfa. Reaction is unknown at this time. Must list due to possible allergy.   Darvocet [Propoxyphene N-Acetaminophen] Itching   Lisinopril     Other reaction(s): cough   Adhesive [Tape] Rash   Codeine Itching and Other (See Comments)    No change when benadryl is given   Demerol [Meperidine] Nausea Only   Hydromorphone Itching and Other (See Comments)    No change when benadryl is given   Losartan Palpitations    Family History: Family History  Problem Relation Age of Onset   Diabetes Paternal Grandmother    Leukemia Maternal Grandmother    Arthritis Mother    High blood pressure Mother    Heart attack Mother    Prostate cancer Father    Cirrhosis Father    Colitis Sister     Hypertension Sister    Deep vein thrombosis Sister    Kidney disease Brother    Obstructive Sleep Apnea Brother    Colon polyps Neg Hx    Pancreatic cancer Neg Hx    Esophageal cancer Neg Hx    Stomach cancer Neg Hx     Observations/Objective:   No acute distress.  Alert and oriented.  Speech fluent and not dysarthric.  Language intact.     Follow Up Instructions:    -I discussed the assessment and treatment plan with the patient. The patient was provided an opportunity to ask questions and all were answered. The patient agreed with the plan and demonstrated an understanding of the instructions.   The patient was advised to call back or seek an in-person evaluation if the symptoms worsen or if the condition fails to improve as anticipated.   Cira Servant, DO  CC: Irena Reichmann, DO

## 2023-03-22 ENCOUNTER — Telehealth (INDEPENDENT_AMBULATORY_CARE_PROVIDER_SITE_OTHER): Payer: Medicare Other | Admitting: Neurology

## 2023-03-22 ENCOUNTER — Encounter: Payer: Self-pay | Admitting: Neurology

## 2023-03-22 DIAGNOSIS — R519 Headache, unspecified: Secondary | ICD-10-CM | POA: Diagnosis not present

## 2023-03-22 DIAGNOSIS — H5111 Convergence insufficiency: Secondary | ICD-10-CM | POA: Diagnosis not present

## 2023-03-22 DIAGNOSIS — Z79899 Other long term (current) drug therapy: Secondary | ICD-10-CM

## 2023-03-22 DIAGNOSIS — H5005 Alternating esotropia: Secondary | ICD-10-CM

## 2023-03-22 MED ORDER — DIVALPROEX SODIUM ER 250 MG PO TB24: 250.0000 mg | ORAL_TABLET | Freq: Every day | ORAL | 5 refills | Status: DC

## 2023-03-31 ENCOUNTER — Encounter (INDEPENDENT_AMBULATORY_CARE_PROVIDER_SITE_OTHER): Payer: Self-pay | Admitting: Internal Medicine

## 2023-04-07 ENCOUNTER — Encounter (INDEPENDENT_AMBULATORY_CARE_PROVIDER_SITE_OTHER): Payer: Self-pay | Admitting: Adult Health

## 2023-04-07 ENCOUNTER — Ambulatory Visit (INDEPENDENT_AMBULATORY_CARE_PROVIDER_SITE_OTHER): Payer: Medicare Other | Admitting: Adult Health

## 2023-04-07 VITALS — BP 107/75 | HR 107 | Temp 97.9°F | Ht 63.0 in | Wt 257.0 lb

## 2023-04-07 DIAGNOSIS — Z9884 Bariatric surgery status: Secondary | ICD-10-CM

## 2023-04-07 DIAGNOSIS — E782 Mixed hyperlipidemia: Secondary | ICD-10-CM | POA: Diagnosis not present

## 2023-04-07 DIAGNOSIS — F419 Anxiety disorder, unspecified: Secondary | ICD-10-CM | POA: Diagnosis not present

## 2023-04-07 DIAGNOSIS — F32A Depression, unspecified: Secondary | ICD-10-CM | POA: Diagnosis not present

## 2023-04-07 DIAGNOSIS — G4700 Insomnia, unspecified: Secondary | ICD-10-CM | POA: Diagnosis not present

## 2023-04-07 DIAGNOSIS — I10 Essential (primary) hypertension: Secondary | ICD-10-CM

## 2023-04-07 DIAGNOSIS — Z6841 Body Mass Index (BMI) 40.0 and over, adult: Secondary | ICD-10-CM

## 2023-04-07 DIAGNOSIS — G894 Chronic pain syndrome: Secondary | ICD-10-CM | POA: Diagnosis not present

## 2023-04-07 DIAGNOSIS — M461 Sacroiliitis, not elsewhere classified: Secondary | ICD-10-CM | POA: Diagnosis not present

## 2023-04-07 DIAGNOSIS — M47816 Spondylosis without myelopathy or radiculopathy, lumbar region: Secondary | ICD-10-CM | POA: Diagnosis not present

## 2023-04-07 NOTE — Progress Notes (Signed)
 Office: [phone]  /  Fax: [phone]   Initial Visit  Carly Delacruz was seen in clinic today to evaluate for obesity. She is interested in losing weight to improve overall health and reduce the risk of weight related complications. She presents today to review program treatment options, initial physical assessment, and evaluation.     She was referred by: Specialist  When asked what else they would like to accomplish? She states: Adopt healthier eating patterns, Improve energy levels and physical activity, Improve existing medical conditions, Reduce number of medications, Improve quality of life, and Loss enough weight to achiver BMI <40 for L TKA  Weight history: Lifelong struggle with weight  When asked how has your weight affected you? She states: Contributed to medical problems, Contributed to orthopedic problems or mobility issues, Having fatigue, Having poor endurance, Problems with eating patterns, and Has affected mood   Some associated conditions: Hypertension, Hyperlipidemia, Prediabetes, and Vitamin D  Deficiency  Contributing factors: Family history of obesity, Disruption of circadian rhythm / sleep disordered breathing, Consumption of processed foods, Use of obesogenic medications: Psychotropic medications and Other: Narcotics, Reduced physical activity, and Eating patterns  Weight promoting medications identified: Psychotropic medications and Other: Narcotics  Current nutrition plan: None  Current level of physical activity: None  Current or previous pharmacotherapy: Phentermine  Response to medication:  Phentermine in her early teen years- unable to recall medication effectiveness ???   Past medical history includes:   Past Medical History:  Diagnosis Date   Allergy    Anemia    Anxiety    Arthritis 12/10/03   Atrophic vaginitis 2009   BV (bacterial vaginosis) 2006   Chronic back pain    Chronic LLQ pain    Chronic post-traumatic stress disorder  (PTSD)    Complication of anesthesia    Elevated LFTs    Endometriosis    Fatigue 2012   Gallstones    GERD (gastroesophageal reflux disease)    H/O bladder infections    H/O mumps    H/O varicella    History of candidal vulvovaginitis 03/21/03   HTN (hypertension)    Insomnia    Insomnia secondary to situational depression 06/06/07   Liver mass, right lobe 08/13/04   Nodule   Malaise 2012   Morbid obesity (HCC) 2005   Osteoporosis    Pelvic floor dysfunction    S/P gastric bypass    Severe episode of recurrent major depressive disorder, without psychotic features (HCC)    Sleep apnea 2005   SUI (stress urinary incontinence, female) 08/12/03   Trichomonas    Vitamin D  deficiency    Vulvar lesion 03/21/03   Yeast infection      Objective:   BP 107/75   Pulse (!) 107   Temp 97.9 F (36.6 C)   Ht 5' 3 (1.6 m)   Wt 257 lb (116.6 kg)   SpO2 94%   BMI 45.53 kg/m  She was weighed on the bioimpedance scale: Body mass index is 45.53 kg/m.  Peak Weight:320 , Body Fat%:52.9, Visceral Fat Rating:19, Weight trend over the last 12 months: Increasing  General:  Alert, oriented and cooperative. Patient is in no acute distress.  Respiratory: Normal respiratory effort, no problems with respiration noted   Gait: able to ambulate independently  Mental Status: Normal mood and affect. Normal behavior. Normal judgment and thought content.   DIAGNOSTIC DATA REVIEWED:  BMET    Component Value Date/Time   NA 142 04/27/2022 1225   NA 143 03/20/2018  1428   K 4.0 04/27/2022 1225   CL 103 04/27/2022 1225   CO2 30 04/27/2022 1225   GLUCOSE 97 04/27/2022 1225   BUN 7 04/27/2022 1225   BUN 10 03/20/2018 1428   CREATININE 0.94 04/27/2022 1225   CALCIUM  9.8 04/27/2022 1225   GFRNONAA 31 (L) 12/26/2021 1225   GFRAA 70 03/20/2018 1428   Lab Results  Component Value Date   HGBA1C 5.8 08/04/2017   No results found for: INSULIN  CBC    Component Value Date/Time   WBC 5.6 09/07/2022  1143   RBC 4.65 09/07/2022 1143   HGB 12.4 09/07/2022 1143   HGB 12.3 03/20/2018 1428   HCT 39.5 09/07/2022 1143   HCT 38.7 03/20/2018 1428   PLT 253.0 09/07/2022 1143   PLT 363 03/20/2018 1428   MCV 85.0 09/07/2022 1143   MCV 82 03/20/2018 1428   MCH 27.1 12/25/2021 0549   MCHC 31.4 09/07/2022 1143   RDW 14.7 09/07/2022 1143   RDW 13.6 03/20/2018 1428   Iron/TIBC/Ferritin/ %Sat    Component Value Date/Time   IRON 39 (L) 05/02/2017 1004   FERRITIN 70.8 05/02/2017 1004   IRONPCTSAT 11.2 (L) 05/02/2017 1004   Lipid Panel     Component Value Date/Time   CHOL 175 02/06/2020 1204   TRIG 79 02/06/2020 1204   HDL 55 02/06/2020 1204   CHOLHDL 3.2 02/06/2020 1204   CHOLHDL 3 08/04/2017 1035   VLDL 16.6 08/04/2017 1035   LDLCALC 105 (H) 02/06/2020 1204   Hepatic Function Panel     Component Value Date/Time   PROT 7.4 09/07/2022 1143   PROT 6.5 02/06/2020 1204   ALBUMIN 4.3 09/07/2022 1143   ALBUMIN 4.0 02/06/2020 1204   AST 30 09/07/2022 1143   ALT 30 09/07/2022 1143   ALKPHOS 96 09/07/2022 1143   BILITOT 0.5 09/07/2022 1143   BILITOT 0.3 02/06/2020 1204   BILIDIR 0.1 09/07/2022 1143   BILIDIR <0.10 02/06/2020 1204      Component Value Date/Time   TSH 1.31 04/27/2022 1225     Assessment and Plan:   Essential hypertension  S/P gastric bypass  Mixed hyperlipidemia  Depression, unspecified depression type  Anxiety  Morbid obesity (HCC), Starting BMI 45.54  ESTABLISH WITH HWW   Obesity Treatment / Action Plan:  Patient will work on garnering support from family and friends to begin weight loss journey. Will work on eliminating or reducing the presence of highly palatable, calorie dense foods in the home. Will complete provided nutritional and psychosocial assessment questionnaire before the next appointment. Will be scheduled for indirect calorimetry to determine resting energy expenditure in a fasting state.  This will allow us  to create a reduced  calorie, high-protein meal plan to promote loss of fat mass while preserving muscle mass. Counseled on the health benefits of losing 5%-15% of total body weight. Was counseled on nutritional approaches to weight loss and benefits of reducing processed foods and consuming plant-based foods and high quality protein as part of nutritional weight management. Was counseled on pharmacotherapy and role as an adjunct in weight management.   Obesity Education Performed Today:  She was weighed on the bioimpedance scale and results were discussed and documented in the synopsis.  We discussed obesity as a disease and the importance of a more detailed evaluation of all the factors contributing to the disease.  We discussed the importance of long term lifestyle changes which include nutrition, exercise and behavioral modifications as well as the importance of customizing this  to her specific health and social needs.  We discussed the benefits of reaching a healthier weight to alleviate the symptoms of existing conditions and reduce the risks of the biomechanical, metabolic and psychological effects of obesity.  Khaylee J Widrig appears to be in the action stage of change and states they are ready to start intensive lifestyle modifications and behavioral modifications.  30 minutes was spent today on this visit including the above counseling, pre-visit chart review, and post-visit documentation.  Reviewed by clinician on day of visit: allergies, medications, problem list, medical history, surgical history, family history, social history, and previous encounter notes pertinent to obesity diagnosis.  Baer Hinton d. Cece Milhouse, NP-C

## 2023-04-11 ENCOUNTER — Other Ambulatory Visit (HOSPITAL_COMMUNITY): Payer: Self-pay

## 2023-04-11 MED ORDER — TIZANIDINE HCL 4 MG PO TABS
ORAL_TABLET | ORAL | 0 refills | Status: AC
  Filled 2023-04-11: qty 40, 17d supply, fill #0

## 2023-04-14 DIAGNOSIS — M170 Bilateral primary osteoarthritis of knee: Secondary | ICD-10-CM | POA: Diagnosis not present

## 2023-05-05 ENCOUNTER — Telehealth: Payer: Self-pay

## 2023-05-05 DIAGNOSIS — G4700 Insomnia, unspecified: Secondary | ICD-10-CM | POA: Diagnosis not present

## 2023-05-05 DIAGNOSIS — Z79891 Long term (current) use of opiate analgesic: Secondary | ICD-10-CM | POA: Diagnosis not present

## 2023-05-05 DIAGNOSIS — M461 Sacroiliitis, not elsewhere classified: Secondary | ICD-10-CM | POA: Diagnosis not present

## 2023-05-05 DIAGNOSIS — M47816 Spondylosis without myelopathy or radiculopathy, lumbar region: Secondary | ICD-10-CM | POA: Diagnosis not present

## 2023-05-05 DIAGNOSIS — G894 Chronic pain syndrome: Secondary | ICD-10-CM | POA: Diagnosis not present

## 2023-05-05 NOTE — Progress Notes (Deleted)
 NEUROLOGY FOLLOW UP OFFICE NOTE  Carly Delacruz [id number]  Assessment/Plan:   Left sided new persistent headache, unclear etiology but consider cervicogenic and/or TMJ dysfunction Convergence insufficiency, alternating esotropia Frequent falls - likely multifactorial related to underlying degenerative lumbar disease, arthritis, and polypharmacy.    For headache: continue on Depakote ER 250mg  at bedtime.  Will repeat CBC and hepatic panel.   Refer to PT for treatment of neck pain Advised to follow up with dentist for evaluation of TMJ dysfunction Follow up in 6 months.     Subjective:  Carly Delacruz is a 58 year old female with chronic back pain, hypertension, depression, anxiety who follows up for headache and frequent falls.  UPDATE: Underwent further lab testing in July to evaluate for disconjugate gaze/cerebellar dysfunction: negative ANA, negative ENA panel, Mg 2.1, B1 12, E 8.2, zinc 79, RPR nonreactive, HIV nonreactive, negative Lyme, negative Celiac panel, thyroperoxidase antibody 2, negative thyroglobulin antibody, elevated thyroglobulin 254.8.  Saw ophthalmology in August, who diagnosed convergence insufficiency and alternating esotropia.  Provided instructions for pencil push ups and may consider Prism in the future if no improvement.    Migraines are overall improved  Sometimes still has left sided facial/head pressure about 3 days a week.  She was ordered PT for neck pain but she cancelled because the neck pain improved.  Unable to find a dentist or periodontist for evaluation of TMJ dysfunction.   HISTORY: She has longstanding history of frequent falls since around 2017.  When she stands up after sitting for a prolonged period of time, her legs feel numb and she cannot move her legs until she gets feeling back.  It takes about 4 minutes.  She may fall while walking, usually her legs just give-out.  This is associated with pain and numbness in the legs.  She has  chronic back pain with degenerative disease of the lumbar spine.  MRI lumbar spine report (imaging not available) from 05/16/17 demonstrated increased lordosis with mild degenerative disc disease causing "central, lateral, recess and foraminal stenosis at L3-L4 and L4-L5, worse at L4-L5" and also "foraminal narrowing at L5-S1 and minimal/mild lateral recess narrowing at L2-L3". She also has arthritis involving the knees.  She also has depression and anxiety.  She has had multiple changes in her medications, such as psychotropic and pain    On 12/24/2021, she woke up with new severe headache, dizziness and generalized weakness.  She was hospitalized and found to have AKI.  MRI of brain personally reviewed showed mild chronic small vessel ischemic changes but no acute intracranial abnormality.  MRA of head was negative for aneurysm, LVO, hemodynamically significant stenosis or other vascular abnormality.  She was treated but continued to have dizziness and headache since then.  The headache is persistent but fluctuates in severity from dull to severe.  She describes a pressure involving the entire right side of her face, most prominently involving the left eye.  It gets worse when she gets up too quickly and will dissipate after a few minutes when she sits back down to rest.  There is associated phonophobia and some photophobia.  Sometimes if she is watching TV or while driving, she will get double vision with one image above the other.  She reports nausea and sometimes vomiting, but that only started about 3 weeks ago.  Denies autonomic symptoms.  Sumatriptan ineffective.  Ubrelvy effective but not covered by her insurance.  Years ago, she had "ice cream" headaches but otherwise no history  of headache.  She did see ENT for the headache and dizziness.  Inner ear etiology was detected but thought it may have been cervicogenic or related to TMJ dysfunction.    Labs from February 2024 included normal CBC and CMP, B12  433, TSH 1.31, negative CRP, mildly elevated sed rate of 56, negative anti-GQ1b, negative paraneoplastic/autoimmune panel (including GAD-65 antibodies).  MRI of brain and orbits with and without contrast on 07/06/2022 were unremarkable.   She has pelvic floor dysfunction with history of increased urinary frequency and retention, requiring self-catheterization.  She is being worked up by urology.  PAST MEDICAL HISTORY: Past Medical History:  Diagnosis Date   Allergy    Anemia    Anxiety    Arthritis 12/10/03   Atrophic vaginitis 2009   BV (bacterial vaginosis) 2006   Chronic back pain    Chronic LLQ pain    Chronic post-traumatic stress disorder (PTSD)    Complication of anesthesia    Elevated LFTs    Endometriosis    Fatigue 2012   Gallstones    GERD (gastroesophageal reflux disease)    H/O bladder infections    H/O mumps    H/O varicella    History of candidal vulvovaginitis 03/21/03   HTN (hypertension)    Insomnia    Insomnia secondary to situational depression 06/06/07   Liver mass, right lobe 08/13/04   Nodule   Malaise 2012   Morbid obesity (HCC) 2005   Osteoporosis    Pelvic floor dysfunction    S/P gastric bypass    Severe episode of recurrent major depressive disorder, without psychotic features (HCC)    Sleep apnea 2005   SUI (stress urinary incontinence, female) 08/12/03   Trichomonas    Vitamin D deficiency    Vulvar lesion 03/21/03   Yeast infection     MEDICATIONS: Current Outpatient Medications on File Prior to Visit  Medication Sig Dispense Refill   amLODipine (NORVASC) 5 MG tablet Take 5 mg by mouth daily.     atorvastatin (LIPITOR) 40 MG tablet Take 1 tablet (40 mg total) by mouth daily for cholesterol 90 tablet 3   buprenorphine (BUTRANS) 5 MCG/HR PTWK Place onto the skin once a week.     diazepam (VALIUM) 5 MG tablet Insert 1 tablet (5 mg total) into the vagina every 8 hours as needed for pelvic floor spasm. 60 tablet 2   divalproex (DEPAKOTE ER) 250 MG  24 hr tablet Take 1 tablet (250 mg total) by mouth at bedtime. 30 tablet 5   esomeprazole (NEXIUM) 40 MG capsule Take 1 capsule (40 mg total) by mouth daily. 30 capsule 1   FLUoxetine (PROZAC) 20 MG capsule Take 1 capsule (20 mg total) by mouth daily. 90 capsule 1   FLUoxetine (PROZAC) 40 MG capsule Take 1 capsule (40 mg total) by mouth daily. 90 capsule 0   fluticasone (FLONASE) 50 MCG/ACT nasal spray Place 1 spray into both nostrils daily. 16 g 2   furosemide (LASIX) 20 MG tablet Take 1 tablet (20 mg total) by mouth daily as needed for fluid or edema. 90 tablet 3   gabapentin (NEURONTIN) 100 MG capsule Take 200 mg by mouth 4 (four) times daily.     hydrOXYzine (VISTARIL) 25 MG capsule Take 1 capsule by mouth three times a day (Patient taking differently: Take 25 mg by mouth every 8 (eight) hours as needed (For sleep per patient).) 270 capsule 0   hydrOXYzine (VISTARIL) 25 MG capsule Take 1 capsule (25 mg  total) by mouth 3 (three) times daily. 270 capsule 0   potassium chloride (KLOR-CON M) 10 MEQ tablet TAKE 1 TABLET BY MOUTH AS NEEDED(TAKE WITH FUROSEMIDE) 90 tablet 2   QUEtiapine (SEROQUEL) 300 MG tablet Take 300 mg by mouth at bedtime.     QUEtiapine Fumarate 150 MG TABS Take 1 tablet by mouth daily.     sodium chloride (OCEAN) 0.65 % SOLN nasal spray Place 1 spray into both nostrils as needed for congestion.  0   tiZANidine (ZANAFLEX) 2 MG tablet Take 1 tablet (2 mg total) by mouth 3 (three) times daily as needed. (Patient taking differently: Take 4 mg by mouth 3 (three) times daily as needed for muscle spasms.) 90 tablet 0   tiZANidine (ZANAFLEX) 4 MG tablet Take 1 tablet by mouth every eight hours as needed for spasm/pain 40 tablet 0   Turmeric POWD Take 1 each by mouth daily.     No current facility-administered medications on file prior to visit.    ALLERGIES: Allergies  Allergen Reactions   Morphine And Codeine Shortness Of Breath and Itching   Septra  [Sulfamethoxazole-Trimethoprim] Anaphylaxis and Hives   Sulfa Antibiotics Anaphylaxis, Hives and Swelling    Throat swelling   Sulfamethoxazole-Trimethoprim Anaphylaxis and Hives    Other reaction(s): anaphylaxis   Sulfasalazine Anaphylaxis, Hives, Swelling and Other (See Comments)    Throat swelling   Ultram [Tramadol] Shortness Of Breath, Anxiety and Other (See Comments)    "panic attacks"    Ciprofloxacin Other (See Comments)    This medication was given along with a sulfa. Reaction is unknown at this time. Must list due to possible allergy.   Darvocet [Propoxyphene N-Acetaminophen] Itching   Lisinopril     Other reaction(s): cough   Adhesive [Tape] Rash   Codeine Itching and Other (See Comments)    No change when benadryl is given   Demerol [Meperidine] Nausea Only   Hydromorphone Itching and Other (See Comments)    No change when benadryl is given   Losartan Palpitations    FAMILY HISTORY: Family History  Problem Relation Age of Onset   Diabetes Paternal Grandmother    Leukemia Maternal Grandmother    Arthritis Mother    High blood pressure Mother    Heart attack Mother    Prostate cancer Father    Cirrhosis Father    Colitis Sister    Hypertension Sister    Deep vein thrombosis Sister    Kidney disease Brother    Obstructive Sleep Apnea Brother    Colon polyps Neg Hx    Pancreatic cancer Neg Hx    Esophageal cancer Neg Hx    Stomach cancer Neg Hx       Objective:  *** General: No acute distress.  Patient appears ***-groomed.   Head:  Normocephalic/atraumatic Eyes:  Fundi examined but not visualized Neck: supple, no paraspinal tenderness, full range of motion Heart:  Regular rate and rhythm Lungs:  Clear to auscultation bilaterally Back: No paraspinal tenderness Neurological Exam: alert and oriented.  Speech fluent and not dysarthric, language intact.  CN II-XII intact. Bulk and tone normal, muscle strength 5/5 throughout.  Sensation to light touch intact.   Deep tendon reflexes 2+ throughout, toes downgoing.  Finger to nose testing intact.  Gait normal, Romberg negative.   Shon Millet, DO  CC: ***

## 2023-05-05 NOTE — Telephone Encounter (Signed)
 Per patient she was having some tremor and shakes.  So she had appt rescheduled.    After having the appt rescheduled patient spoke to a [facility] provider through her [facility] insurance.   Patient was advised that she is taking too much antidepressant.   Patient on two Seroquel doses.   Advised patient to discuss this with Psychiatrist the new plan of her antidepressant to stop one. No need to have her appt with Dr.Jaffe on 05/06/23.   Front reschedule her 6 month original appt.

## 2023-05-06 ENCOUNTER — Ambulatory Visit: Payer: Medicare Other | Admitting: Neurology

## 2023-05-09 DIAGNOSIS — Z79891 Long term (current) use of opiate analgesic: Secondary | ICD-10-CM | POA: Diagnosis not present

## 2023-05-09 DIAGNOSIS — G894 Chronic pain syndrome: Secondary | ICD-10-CM | POA: Diagnosis not present

## 2023-06-08 DIAGNOSIS — R7303 Prediabetes: Secondary | ICD-10-CM | POA: Diagnosis not present

## 2023-06-15 ENCOUNTER — Other Ambulatory Visit (HOSPITAL_COMMUNITY): Payer: Self-pay

## 2023-06-15 MED ORDER — TIZANIDINE HCL 4 MG PO TABS
ORAL_TABLET | ORAL | 0 refills | Status: AC
  Filled 2023-06-15: qty 40, 15d supply, fill #0

## 2023-06-16 ENCOUNTER — Other Ambulatory Visit (HOSPITAL_COMMUNITY): Payer: Self-pay

## 2023-06-16 DIAGNOSIS — M1711 Unilateral primary osteoarthritis, right knee: Secondary | ICD-10-CM | POA: Diagnosis not present

## 2023-06-16 DIAGNOSIS — M170 Bilateral primary osteoarthritis of knee: Secondary | ICD-10-CM | POA: Diagnosis not present

## 2023-06-16 DIAGNOSIS — M1712 Unilateral primary osteoarthritis, left knee: Secondary | ICD-10-CM | POA: Diagnosis not present

## 2023-06-20 ENCOUNTER — Other Ambulatory Visit: Payer: Self-pay | Admitting: Obstetrics and Gynecology

## 2023-06-20 DIAGNOSIS — M629 Disorder of muscle, unspecified: Secondary | ICD-10-CM | POA: Diagnosis not present

## 2023-06-20 DIAGNOSIS — Z1231 Encounter for screening mammogram for malignant neoplasm of breast: Secondary | ICD-10-CM

## 2023-06-20 DIAGNOSIS — N189 Chronic kidney disease, unspecified: Secondary | ICD-10-CM | POA: Diagnosis not present

## 2023-06-20 DIAGNOSIS — E559 Vitamin D deficiency, unspecified: Secondary | ICD-10-CM | POA: Diagnosis not present

## 2023-06-20 DIAGNOSIS — M810 Age-related osteoporosis without current pathological fracture: Secondary | ICD-10-CM | POA: Diagnosis not present

## 2023-06-22 DIAGNOSIS — H5111 Convergence insufficiency: Secondary | ICD-10-CM | POA: Diagnosis not present

## 2023-06-22 DIAGNOSIS — H5005 Alternating esotropia: Secondary | ICD-10-CM | POA: Diagnosis not present

## 2023-06-24 DIAGNOSIS — Z780 Asymptomatic menopausal state: Secondary | ICD-10-CM | POA: Diagnosis not present

## 2023-06-24 DIAGNOSIS — Z1231 Encounter for screening mammogram for malignant neoplasm of breast: Secondary | ICD-10-CM | POA: Diagnosis not present

## 2023-06-24 DIAGNOSIS — Z13820 Encounter for screening for osteoporosis: Secondary | ICD-10-CM | POA: Diagnosis not present

## 2023-06-30 ENCOUNTER — Ambulatory Visit

## 2023-07-13 DIAGNOSIS — M461 Sacroiliitis, not elsewhere classified: Secondary | ICD-10-CM | POA: Diagnosis not present

## 2023-07-13 DIAGNOSIS — G894 Chronic pain syndrome: Secondary | ICD-10-CM | POA: Diagnosis not present

## 2023-07-13 DIAGNOSIS — M47816 Spondylosis without myelopathy or radiculopathy, lumbar region: Secondary | ICD-10-CM | POA: Diagnosis not present

## 2023-07-13 DIAGNOSIS — G4700 Insomnia, unspecified: Secondary | ICD-10-CM | POA: Diagnosis not present

## 2023-07-16 ENCOUNTER — Ambulatory Visit: Admission: EM | Admit: 2023-07-16 | Discharge: 2023-07-16 | Disposition: A | Discharge status: Home / Self Care

## 2023-07-16 ENCOUNTER — Encounter: Payer: Self-pay | Admitting: Emergency Medicine

## 2023-07-16 ENCOUNTER — Ambulatory Visit

## 2023-07-16 DIAGNOSIS — S99922A Unspecified injury of left foot, initial encounter: Secondary | ICD-10-CM

## 2023-07-16 DIAGNOSIS — S0990XA Unspecified injury of head, initial encounter: Secondary | ICD-10-CM

## 2023-07-16 DIAGNOSIS — M79672 Pain in left foot: Secondary | ICD-10-CM | POA: Diagnosis not present

## 2023-07-16 DIAGNOSIS — W19XXXA Unspecified fall, initial encounter: Secondary | ICD-10-CM

## 2023-07-16 DIAGNOSIS — M85872 Other specified disorders of bone density and structure, left ankle and foot: Secondary | ICD-10-CM | POA: Diagnosis not present

## 2023-07-16 DIAGNOSIS — R1012 Left upper quadrant pain: Secondary | ICD-10-CM | POA: Insufficient documentation

## 2023-07-16 DIAGNOSIS — E7800 Pure hypercholesterolemia, unspecified: Secondary | ICD-10-CM | POA: Insufficient documentation

## 2023-07-16 DIAGNOSIS — M9905 Segmental and somatic dysfunction of pelvic region: Secondary | ICD-10-CM | POA: Insufficient documentation

## 2023-07-16 DIAGNOSIS — R32 Unspecified urinary incontinence: Secondary | ICD-10-CM | POA: Insufficient documentation

## 2023-07-16 DIAGNOSIS — N644 Mastodynia: Secondary | ICD-10-CM | POA: Insufficient documentation

## 2023-07-16 DIAGNOSIS — M7732 Calcaneal spur, left foot: Secondary | ICD-10-CM | POA: Diagnosis not present

## 2023-07-16 MED ORDER — ACETAMINOPHEN 325 MG PO TABS
650.0000 mg | ORAL_TABLET | Freq: Once | ORAL | Status: AC
  Administered 2023-07-16: 650 mg via ORAL

## 2023-07-16 NOTE — ED Triage Notes (Signed)
 Pt presents c/o a knot on the left side of her head right behind the ear and throbbing foot pain to the left foot. Pt says she has a new bed that sits up a little higher and she fell out of the bed last night.

## 2023-07-16 NOTE — ED Provider Notes (Signed)
 [facility]    CSN: [id number] Arrival date & time: 07/16/23  1002      History   Chief Complaint Chief Complaint  Patient presents with   Fall   Head Injury   Foot Pain    HPI Carly Delacruz is a 58 y.o. female.  Patient here today for evaluation of left foot pain and a small nodule on the left side of her head after falling she reports from her bed while sleeping in the early morning hours.  Patient reports she is unsure of how she fell.  Reports her bed is high and is uncertain of what she landed on.  She has not taken any medication for pain prior to arriving here in urgent care.  She is concerned that her left foot is broken.  She is having pain with ambulation.  She reports she did not lose consciousness and denies any visual acuity changes or dizziness.  Past Medical History:  Diagnosis Date   Allergy    Anemia    Anxiety    Arthritis 12/10/03   Atrophic vaginitis 2009   BV (bacterial vaginosis) 2006   Chronic back pain    Chronic LLQ pain    Chronic post-traumatic stress disorder (PTSD)    Complication of anesthesia    Elevated LFTs    Endometriosis    Fatigue 2012   Gallstones    GERD (gastroesophageal reflux disease)    H/O bladder infections    H/O mumps    H/O varicella    History of candidal vulvovaginitis 03/21/03   HTN (hypertension)    Insomnia    Insomnia secondary to situational depression 06/06/07   Liver mass, right lobe 08/13/04   Nodule   Malaise 2012   Morbid obesity (HCC) 2005   Osteoporosis    Pelvic floor dysfunction    S/P gastric bypass    Severe episode of recurrent major depressive disorder, without psychotic features (HCC)    Sleep apnea 2005   SUI (stress urinary incontinence, female) 08/12/03   Trichomonas    Vitamin D  deficiency    Vulvar lesion 03/21/03   Yeast infection     Patient Active Problem List   Diagnosis Date Noted   Abdominal pain, left upper quadrant 07/16/2023   Breast tenderness 07/16/2023    Hypercholesterolemia without hypertriglyceridemia 07/16/2023   Somatic dysfunction of pelvic region 07/16/2023   Urinary incontinence 07/16/2023   Kidney function test abnormal 02/02/2022   Retention of urine 02/02/2022   AKI (acute kidney injury) (HCC) 12/24/2021   Weakness 12/24/2021   Anxiety 12/24/2021   Hyperlipidemia 06/05/2019   DJD (degenerative joint disease) of knee 12/06/2018   Cough 12/06/2018   Osteopenia 08/31/2018   Other chronic pain 02/01/2018   Depression 05/08/2017   Glaucoma 05/02/2017   Vitamin D  deficiency 10/15/2016   Chronic back pain 08/12/2016   Anemia 08/12/2016   H/O bladder infections 08/12/2016   S/P gastric bypass 08/12/2016   Polypharmacy    High-tone pelvic floor dysfunction 08/21/2014   Sleep apnea 07/26/2014   GERD (gastroesophageal reflux disease) 06/15/2014   Neuropathy 06/15/2014   Headache 06/15/2014   Essential hypertension 06/15/2014   N&V (nausea and vomiting) 09/03/2013   Morbid obesity (HCC) 03/02/2003    Past Surgical History:  Procedure Laterality Date   ABDOMINAL HYSTERECTOMY  2001   APPENDECTOMY  2001   CHOLECYSTECTOMY  2011   DILATION AND CURETTAGE OF UTERUS     GASTRIC BYPASS  2007   [place]  [facility], Dr. Gaylyn Keas   KNEE ARTHROSCOPY Left    LAPAROTOMY     TONSILLECTOMY     WISDOM TOOTH EXTRACTION      OB History     Gravida  0   Para  0   Term      Preterm      AB      Living         SAB      IAB      Ectopic      Multiple      Live Births               Home Medications    Prior to Admission medications   Medication Sig Start Date End Date Taking? Authorizing Provider  acetaminophen  (TYLENOL ) 500 MG tablet Take 500 mg by mouth. 06/29/17  Yes [provider]  amLODipine -valsartan  (EXFORGE ) 5-160 MG tablet Take 1 tablet by mouth daily. 08/21/14  Yes [provider]  Multiple Vitamin (MULTI VITAMIN) TABS 1 tablet Orally Once a day 05/31/19  Yes [provider]   amLODipine  (NORVASC ) 5 MG tablet Take 5 mg by mouth daily.    [provider]  amLODipine  (NORVASC ) 5 MG tablet 1 tablet Orally Once a day for blood pressure for 30 days    [provider]  atorvastatin  (LIPITOR) 40 MG tablet Take 1 tablet (40 mg total) by mouth daily for cholesterol 01/14/22     buprenorphine  (BUTRANS ) 5 MCG/HR PTWK Place onto the skin once a week.    [provider]  clindamycin (CLEOCIN) 150 MG capsule Take 1 capsule by mouth 4 (four) times daily.    [provider]  COVID-19 At Home Antigen Test [facility] COVID-19 RAPID TEST) KIT See Admin Instructions.    [provider]  diazepam  (VALIUM ) 5 MG tablet Insert 1 tablet (5 mg total) into the vagina every 8 hours as needed for pelvic floor spasm. 11/26/21     divalproex  (DEPAKOTE  ER) 250 MG 24 hr tablet Take 1 tablet (250 mg total) by mouth at bedtime. 03/22/23   Merriam Abbey, DO  esomeprazole  (NEXIUM ) 40 MG capsule Take 1 capsule (40 mg total) by mouth daily. 11/24/22     famotidine  (PEPCID ) 20 MG tablet Take 20 mg by mouth 2 (two) times daily.    [provider]  FLUoxetine  (PROZAC ) 20 MG capsule Take 1 capsule (20 mg total) by mouth daily. 11/09/21     FLUoxetine  (PROZAC ) 40 MG capsule Take 1 capsule (40 mg total) by mouth daily. 10/03/17   Lana Pina, MD  fluticasone  (FLONASE ) 50 MCG/ACT nasal spray Place 1 spray into both nostrils daily. 12/27/21 03/27/22  Verlyn Goad, MD  furosemide  (LASIX ) 20 MG tablet Take 1 tablet (20 mg total) by mouth daily as needed for fluid or edema. 03/05/21   Swinyer, Leilani Punter, NP  gabapentin  (NEURONTIN ) 100 MG capsule Take 200 mg by mouth 4 (four) times daily. 08/24/22   [provider]  hydrOXYzine  (ATARAX ) 25 MG tablet 1 tablet orally twice per day    [provider]  hydrOXYzine  (VISTARIL ) 25 MG capsule Take 1 capsule by mouth three times a day Patient taking differently: Take 25 mg by mouth every 8 (eight) hours as  needed (For sleep per patient). 08/19/21     hydrOXYzine  (VISTARIL ) 25 MG capsule Take 1 capsule (25 mg total) by mouth 3 (three) times daily. 01/18/22     Menthol, Topical Analgesic, (BIOFREEZE ROLL-ON) 4 %  GEL     [provider]  potassium chloride  (KLOR-CON  M) 10 MEQ tablet TAKE 1 TABLET BY MOUTH AS NEEDED(TAKE WITH FUROSEMIDE ) 02/09/23   Walker, Caitlin S, NP  QUEtiapine  (SEROQUEL ) 300 MG tablet Take 300 mg by mouth at bedtime.    [provider]  QUEtiapine  Fumarate 150 MG TABS Take 1 tablet by mouth daily. 07/16/22   [provider]  sodium chloride  (OCEAN) 0.65 % SOLN nasal spray Place 1 spray into both nostrils as needed for congestion. 12/26/21   Verlyn Goad, MD  tiZANidine  (ZANAFLEX ) 2 MG tablet Take 1 tablet (2 mg total) by mouth 3 (three) times daily as needed. Patient taking differently: Take 4 mg by mouth 3 (three) times daily as needed for muscle spasms. 01/18/22     tiZANidine  (ZANAFLEX ) 4 MG tablet Take 1 tablet by mouth every eight hours as needed for spasm/pain 04/11/23     tiZANidine  (ZANAFLEX ) 4 MG tablet Take 1 tablet by mouth every eight hours as needed for spasm/pain 06/15/23     Turmeric POWD Take 1 each by mouth daily.    [provider]  zoledronic  acid (RECLAST ) 5 MG/100ML SOLN injection as directed Intravenous    [provider]    Family History Family History  Problem Relation Age of Onset   Diabetes Paternal Grandmother    Leukemia Maternal Grandmother    Arthritis Mother    High blood pressure Mother    Heart attack Mother    Prostate cancer Father    Cirrhosis Father    Colitis Sister    Hypertension Sister    Deep vein thrombosis Sister    Kidney disease Brother    Obstructive Sleep Apnea Brother    Colon polyps Neg Hx    Pancreatic cancer Neg Hx    Esophageal cancer Neg Hx    Stomach cancer Neg Hx     Social History Social History   Tobacco Use   Smoking status: Never    Passive exposure: Never    Smokeless tobacco: Never  Vaping Use   Vaping status: Never Used  Substance Use Topics   Alcohol use: No   Drug use: No     Allergies   Ciprofloxacin , Morphine and codeine, Septra [sulfamethoxazole-trimethoprim], Sulfa antibiotics, Sulfamethoxazole-trimethoprim, Sulfasalazine, Tramadol, Darvocet [propoxyphene n-acetaminophen ], Lisinopril , Codeine, Demerol [meperidine], Hydromorphone , Losartan, and Tape   Review of Systems Review of Systems Pertinent negatives listed in HPI   Physical Exam Triage Vital Signs ED Triage Vitals  Encounter Vitals Group     BP 07/16/23 1100 107/76     Systolic BP Percentile --      Diastolic BP Percentile --      Pulse Rate 07/16/23 1100 80     Resp 07/16/23 1100 18     Temp 07/16/23 1100 (!) 97.4 F (36.3 C)     Temp Source 07/16/23 1100 Oral     SpO2 07/16/23 1100 97 %     Weight 07/16/23 1059 257 lb 0.9 oz (116.6 kg)     Height --      Head Circumference --      Peak Flow --      Pain Score 07/16/23 1058 9     Pain Loc --      Pain Education --      Exclude from Growth Chart --    No data found.  Updated Vital Signs BP 107/76 (BP Location: Left Arm)   Pulse 80   Temp (!) 97.4 F (  36.3 C) (Oral)   Resp 18   Wt 257 lb 0.9 oz (116.6 kg)   SpO2 97%   BMI 45.54 kg/m   Visual Acuity Right Eye Distance:   Left Eye Distance:   Bilateral Distance:    Right Eye Near:   Left Eye Near:    Bilateral Near:     Physical Exam Vitals reviewed.  Constitutional:      Appearance: Normal appearance. She is not ill-appearing, toxic-appearing or diaphoretic.  HENT:     Head: Normocephalic and atraumatic.   Cardiovascular:     Rate and Rhythm: Normal rate and regular rhythm.  Pulmonary:     Effort: Pulmonary effort is normal.  Musculoskeletal:     Cervical back: Normal range of motion.     Left foot: Decreased range of motion. Tenderness present. No swelling, deformity, prominent metatarsal heads, laceration or bony tenderness.   Skin:    General: Skin is warm and dry.  Neurological:     General: No focal deficit present.     Mental Status: She is alert.      UC Treatments / Results  Labs (all labs ordered are listed, but only abnormal results are displayed) Labs Reviewed - No data to display  EKG   Radiology DG Foot Complete Left Result Date: 07/16/2023 CLINICAL DATA:  Left foot pain, fell out of bed EXAM: LEFT FOOT - COMPLETE 3+ VIEW COMPARISON:  02/17/2016 FINDINGS: Frontal, oblique, and lateral views of the left foot are obtained. The bones are osteopenic. No acute fracture, subluxation, or dislocation. Mild diffuse osteoarthritis greatest throughout the interphalangeal joints and first metatarsophalangeal joint. Prominent inferior calcaneal spur. Soft tissues are unremarkable. IMPRESSION: 1. No acute fracture. 2. Multifocal osteoarthritis. Electronically Signed   By: Bobbye Burrow M.D.   On: 07/16/2023 11:42    Procedures Procedures (including critical care time)  Medications Ordered in UC Medications  acetaminophen  (TYLENOL ) tablet 650 mg (650 mg Oral Given 07/16/23 1216)    Initial Impression / Assessment and Plan / UC Course  I have reviewed the triage vital signs and the nursing notes.  Pertinent labs & imaging results that were available during my care of the patient were reviewed by me and considered in my medical decision making (see chart for details).    Patient here today status post a fall from her bed with concern for injury to her head and the left foot.  Imaging of the left foot completely unremarkable however patient endorses that she is having pain however has not taken any medication since the fall occurred.  General appearance of left foot is completely unremarkable there is no visible deformity, redness or bruising related to injury today.  Imaging results are completely unremarkable for fracture or any dislocation or deformity.  Physical exam of head shows no specific contusion  injury or swelling related to recent fall.  Patient was given a one-time dose of Tylenol  650 mg here in clinic.  Patient was advised to continue Tylenol  every 4-6 hours as needed for pain.  Patient left foot was wrapped in an Ace wrapped thinly so that she could apply her shoe back to the foot.  She is encouraged to apply ice and elevate as needed until pain subsides.  There is currently no restrictions to regular activity.  Follow-up with orthopedic if foot pain persists.  Patient verbalized understanding and agreement with plan. Final Clinical Impressions(s) / UC Diagnoses   Final diagnoses:  Fall, initial encounter  Injury of left foot, initial  encounter  Injury of head, initial encounter   Discharge Instructions   None    ED Prescriptions   None    I have reviewed the PDMP during this encounter.   Buena Carmine, NP 07/17/23 2136

## 2023-08-04 DIAGNOSIS — N189 Chronic kidney disease, unspecified: Secondary | ICD-10-CM | POA: Diagnosis not present

## 2023-08-04 DIAGNOSIS — M6289 Other specified disorders of muscle: Secondary | ICD-10-CM | POA: Diagnosis not present

## 2023-08-04 DIAGNOSIS — N398 Other specified disorders of urinary system: Secondary | ICD-10-CM | POA: Diagnosis not present

## 2023-08-04 DIAGNOSIS — N390 Urinary tract infection, site not specified: Secondary | ICD-10-CM | POA: Diagnosis not present

## 2023-08-04 DIAGNOSIS — R339 Retention of urine, unspecified: Secondary | ICD-10-CM | POA: Diagnosis not present

## 2023-09-01 DIAGNOSIS — R6884 Jaw pain: Secondary | ICD-10-CM | POA: Diagnosis not present

## 2023-09-01 DIAGNOSIS — R220 Localized swelling, mass and lump, head: Secondary | ICD-10-CM | POA: Diagnosis not present

## 2023-09-15 DIAGNOSIS — R339 Retention of urine, unspecified: Secondary | ICD-10-CM | POA: Diagnosis not present

## 2023-09-19 ENCOUNTER — Ambulatory Visit: Payer: Medicare Other | Admitting: Neurology

## 2023-09-19 NOTE — Progress Notes (Signed)
 NEUROLOGY FOLLOW UP OFFICE NOTE  Carly Delacruz [id number]  Assessment/Plan:   Left sided persistent headache, unclear etiology.  Primary concern is secondary to dental etiology or TMJ dysfunction.  In addition, she has been diagnosed with parotitis which would aggravate the situation.   It may also be cervicogenic.  Also consider giant cell arteritis.  However, prior labs showed just mildly elevated sed rate with negative CRP.  At this point, I do not suspect a primary neurologic condition.   Convergence insufficiency, alternating esotropia Frequent falls - likely multifactorial related to underlying degenerative lumbar disease, arthritis    Since she thinks she may have had some improvement, will increase Depakote  ER to 500mg  at bedtime.  Check CBC and LFTs today. Would refrain from checking repeat sed rate and CRP as she currently has infection.  Consider rechecking labs in future I would still follow up with oral surgery/dentistry for further workup and treatment, as I feel that this is the source of her headaches.  Would also recommend following up with PCP regarding facial swelling as symptoms have not improved with antibiotics.   Follow up 6 months.  Total time spent in chart and face to face with patient:  48 minutes     Subjective:  Carly Delacruz is a 58 year old female with CKD, chronic back pain, pelvic floor dysfunction, hypertension, depression, anxiety who follows up for headache and frequent falls.  UPDATE: Referred to PT for neck pain.  Minimal improvement.  She noted some improvement with the Depakote .  She has had some dental problems.  Seen by the dentist as well as oral surgery.  She has osteoporosis that also involves her mandible so surgical options are limited.  Panoramic imaging was recommended but it was very expensive.  More recently, she developed swelling on the left side of her face.  She was diagnosed with parotitis and prescribed antibiotics.  At the  same time, she has been diagnosed with UTI and currently is taking antibiotics for that as well.      HISTORY: She has longstanding history of frequent falls since around 2017.  When she stands up after sitting for a prolonged period of time, her legs feel numb and she cannot move her legs until she gets feeling back.  It takes about 4 minutes.  She may fall while walking, usually her legs just give-out.  This is associated with pain and numbness in the legs.  She has chronic back pain with degenerative disease of the lumbar spine.  MRI lumbar spine report (imaging not available) from 05/16/17 demonstrated increased lordosis with mild degenerative disc disease causing central, lateral, recess and foraminal stenosis at L3-L4 and L4-L5, worse at L4-L5 and also foraminal narrowing at L5-S1 and minimal/mild lateral recess narrowing at L2-L3. She also has arthritis involving the knees.  She also has depression and anxiety.  She has had multiple changes in her medications, such as psychotropic and pain    On 12/24/2021, she woke up with new severe headache, dizziness and generalized weakness.  She was hospitalized and found to have AKI.  MRI of brain howed mild chronic small vessel ischemic changes but no acute intracranial abnormality.  MRA of head was negative for aneurysm, LVO, hemodynamically significant stenosis or other vascular abnormality.  She was treated but continued to have dizziness and headache since then.  The headache is persistent but fluctuates in severity from dull to severe.  She describes a pressure involving the entire right side of  her face, most prominently involving the left eye.  It gets worse when she gets up too quickly and will dissipate after a few minutes when she sits back down to rest.  There is associated phonophobia and some photophobia.  Sometimes if she is watching TV or while driving, she will get double vision with one image above the other.  She reports nausea and sometimes  vomiting.  Denies autonomic symptoms.  Sumatriptan  ineffective.  Ubrelvy  effective but not covered by her insurance.  Years ago, she had ice cream headaches but otherwise no history of headache.  She did see ENT for the headache and dizziness.  Inner ear etiology was not detected but thought it may have been cervicogenic or related to TMJ dysfunction.    Labs from 2024 included sed rate 56 and negative CRP.  Other labs included CBC and CMP, B12 433, TSH 1.31, negative CRP, mildly elevated sed rate of 56, negative anti-GQ1b, negative paraneoplastic/autoimmune panel (including GAD-65 antibodies), negative ANA, negative ENA panel, Mg 2.1, B1 12, E 8.2, zinc  79, RPR nonreactive, HIV nonreactive, negative Lyme, negative Celiac panel, thyroperoxidase antibody 2, negative thyroglobulin antibody, elevated thyroglobulin 254.8.  MRI of brain and orbits with and without contrast on 07/06/2022 were unremarkable.  Saw ophthalmology in August 2024, who diagnosed convergence insufficiency and alternating esotropia.  Provided instructions for pencil push ups and may consider Prism  in the future if no improvement.    Past medications:  amitriptyline , gabapentin , metoprolol , lisinopril , baclofen , Flexeril , Robaxin , sumatriptan    She has pelvic floor dysfunction with history of increased urinary frequency and retention, requiring self-catheterization.  She is being worked up by urology.  PAST MEDICAL HISTORY: Past Medical History:  Diagnosis Date   Allergy    Anemia    Anxiety    Arthritis 12/10/03   Atrophic vaginitis 2009   BV (bacterial vaginosis) 2006   Chronic back pain    Chronic LLQ pain    Chronic post-traumatic stress disorder (PTSD)    Complication of anesthesia    Elevated LFTs    Endometriosis    Fatigue 2012   Gallstones    GERD (gastroesophageal reflux disease)    H/O bladder infections    H/O mumps    H/O varicella    History of candidal vulvovaginitis 03/21/03   HTN (hypertension)     Insomnia    Insomnia secondary to situational depression 06/06/07   Liver mass, right lobe 08/13/04   Nodule   Malaise 2012   Morbid obesity (HCC) 2005   Osteoporosis    Pelvic floor dysfunction    S/P gastric bypass    Severe episode of recurrent major depressive disorder, without psychotic features (HCC)    Sleep apnea 2005   SUI (stress urinary incontinence, female) 08/12/03   Trichomonas    Vitamin D  deficiency    Vulvar lesion 03/21/03   Yeast infection     MEDICATIONS: Current Outpatient Medications on File Prior to Visit  Medication Sig Dispense Refill   acetaminophen  (TYLENOL ) 500 MG tablet Take 500 mg by mouth.     amLODipine  (NORVASC ) 5 MG tablet Take 5 mg by mouth daily.     amLODipine  (NORVASC ) 5 MG tablet 1 tablet Orally Once a day for blood pressure for 30 days     amLODipine -valsartan  (EXFORGE ) 5-160 MG tablet Take 1 tablet by mouth daily.     atorvastatin  (LIPITOR) 40 MG tablet Take 1 tablet (40 mg total) by mouth daily for cholesterol 90 tablet 3   buprenorphine  (BUTRANS )  5 MCG/HR PTWK Place onto the skin once a week.     clindamycin (CLEOCIN) 150 MG capsule Take 1 capsule by mouth 4 (four) times daily.     COVID-19 At Home Antigen Test [facility] COVID-19 RAPID TEST) KIT See Admin Instructions.     diazepam  (VALIUM ) 5 MG tablet Insert 1 tablet (5 mg total) into the vagina every 8 hours as needed for pelvic floor spasm. 60 tablet 2   divalproex  (DEPAKOTE  ER) 250 MG 24 hr tablet Take 1 tablet (250 mg total) by mouth at bedtime. 30 tablet 5   esomeprazole  (NEXIUM ) 40 MG capsule Take 1 capsule (40 mg total) by mouth daily. 30 capsule 1   famotidine  (PEPCID ) 20 MG tablet Take 20 mg by mouth 2 (two) times daily.     FLUoxetine  (PROZAC ) 20 MG capsule Take 1 capsule (20 mg total) by mouth daily. 90 capsule 1   FLUoxetine  (PROZAC ) 40 MG capsule Take 1 capsule (40 mg total) by mouth daily. 90 capsule 0   fluticasone  (FLONASE ) 50 MCG/ACT nasal spray Place 1 spray into both nostrils  daily. 16 g 2   furosemide  (LASIX ) 20 MG tablet Take 1 tablet (20 mg total) by mouth daily as needed for fluid or edema. 90 tablet 3   gabapentin  (NEURONTIN ) 100 MG capsule Take 200 mg by mouth 4 (four) times daily.     hydrOXYzine  (ATARAX ) 25 MG tablet 1 tablet orally twice per day     hydrOXYzine  (VISTARIL ) 25 MG capsule Take 1 capsule by mouth three times a day (Patient taking differently: Take 25 mg by mouth every 8 (eight) hours as needed (For sleep per patient).) 270 capsule 0   hydrOXYzine  (VISTARIL ) 25 MG capsule Take 1 capsule (25 mg total) by mouth 3 (three) times daily. 270 capsule 0   Menthol, Topical Analgesic, (BIOFREEZE ROLL-ON) 4 % GEL      Multiple Vitamin (MULTI VITAMIN) TABS 1 tablet Orally Once a day     potassium chloride  (KLOR-CON  M) 10 MEQ tablet TAKE 1 TABLET BY MOUTH AS NEEDED(TAKE WITH FUROSEMIDE ) 90 tablet 2   QUEtiapine  (SEROQUEL ) 300 MG tablet Take 300 mg by mouth at bedtime.     QUEtiapine  Fumarate 150 MG TABS Take 1 tablet by mouth daily.     sodium chloride  (OCEAN) 0.65 % SOLN nasal spray Place 1 spray into both nostrils as needed for congestion.  0   tiZANidine  (ZANAFLEX ) 2 MG tablet Take 1 tablet (2 mg total) by mouth 3 (three) times daily as needed. (Patient taking differently: Take 4 mg by mouth 3 (three) times daily as needed for muscle spasms.) 90 tablet 0   tiZANidine  (ZANAFLEX ) 4 MG tablet Take 1 tablet by mouth every eight hours as needed for spasm/pain 40 tablet 0   tiZANidine  (ZANAFLEX ) 4 MG tablet Take 1 tablet by mouth every eight hours as needed for spasm/pain 40 tablet 0   Turmeric POWD Take 1 each by mouth daily.     zoledronic  acid (RECLAST ) 5 MG/100ML SOLN injection as directed Intravenous     No current facility-administered medications on file prior to visit.    ALLERGIES: Allergies  Allergen Reactions   Ciprofloxacin  Other (See Comments) and Anaphylaxis    This medication was given along with a sulfa. Reaction is unknown at this time. Must  list due to possible allergy.   Morphine And Codeine Shortness Of Breath and Itching   Septra [Sulfamethoxazole-Trimethoprim] Anaphylaxis and Hives   Sulfa Antibiotics Anaphylaxis, Hives and Swelling  Throat swelling   Sulfamethoxazole-Trimethoprim Anaphylaxis and Hives    Other reaction(s): anaphylaxis   Sulfasalazine Anaphylaxis, Hives, Swelling and Other (See Comments)    Throat swelling   Tramadol Anxiety, Other (See Comments), Shortness Of Breath and Anaphylaxis    panic attacks   Darvocet [Propoxyphene N-Acetaminophen ] Itching   Lisinopril  Cough    Other reaction(s): cough   Codeine Itching and Other (See Comments)    No change when benadryl  is given   Demerol [Meperidine] Nausea Only   Hydromorphone  Itching and Other (See Comments)    No change when benadryl  is given   Losartan Palpitations   Tape Rash and Dermatitis    FAMILY HISTORY: Family History  Problem Relation Age of Onset   Diabetes Paternal Grandmother    Leukemia Maternal Grandmother    Arthritis Mother    High blood pressure Mother    Heart attack Mother    Prostate cancer Father    Cirrhosis Father    Colitis Sister    Hypertension Sister    Deep vein thrombosis Sister    Kidney disease Brother    Obstructive Sleep Apnea Brother    Colon polyps Neg Hx    Pancreatic cancer Neg Hx    Esophageal cancer Neg Hx    Stomach cancer Neg Hx       Objective:  Blood pressure (!) 141/79, pulse (!) 110, height 5' 3 (1.6 m), weight 267 lb (121.1 kg), SpO2 96%. General: No acute distress.  Patient appears well-groomed.   Head:  Normocephalic/atraumatic, tenderness to palpation of left TMJ.  Not some swelling on the left side of her face. Eyes:  Fundi examined but not visualized Neck: supple, some right sided paraspinal tenderness, full range of motion Heart:  Regular rate and rhythm Neurological Exam: alert and oriented.  Speech fluent and not dysarthric, language intact.  Reduced left V2-V3.  Dysconjugate  gaze.  Otherwise, CN II-XII intact.  Muscle strength 5/5.  Deep tendon reflexes 2+ throughout.  Wide-based antalgic gait.  Uses cane to ambulate.  Romberg with mild sway.   Juliene Dunnings, DO  CC: Lonell Collet, DO

## 2023-09-20 ENCOUNTER — Encounter: Payer: Self-pay | Admitting: Neurology

## 2023-09-20 ENCOUNTER — Other Ambulatory Visit

## 2023-09-20 ENCOUNTER — Ambulatory Visit: Admitting: Neurology

## 2023-09-20 VITALS — BP 141/79 | HR 110 | Ht 63.0 in | Wt 267.0 lb

## 2023-09-20 DIAGNOSIS — R519 Headache, unspecified: Secondary | ICD-10-CM | POA: Diagnosis not present

## 2023-09-20 DIAGNOSIS — H5111 Convergence insufficiency: Secondary | ICD-10-CM

## 2023-09-20 DIAGNOSIS — Z79899 Other long term (current) drug therapy: Secondary | ICD-10-CM

## 2023-09-20 DIAGNOSIS — M542 Cervicalgia: Secondary | ICD-10-CM

## 2023-09-20 LAB — CBC
HCT: 43.3 % (ref 35.0–45.0)
Hemoglobin: 13.6 g/dL (ref 11.7–15.5)
MCH: 27.9 pg (ref 27.0–33.0)
MCHC: 31.4 g/dL — ABNORMAL LOW (ref 32.0–36.0)
MCV: 88.9 fL (ref 80.0–100.0)
MPV: 11.1 fL (ref 7.5–12.5)
Platelets: 319 Thousand/uL (ref 140–400)
RBC: 4.87 Million/uL (ref 3.80–5.10)
RDW: 13.7 % (ref 11.0–15.0)
WBC: 7 Thousand/uL (ref 3.8–10.8)

## 2023-09-20 LAB — HEPATIC FUNCTION PANEL
AG Ratio: 1.7 (calc) (ref 1.0–2.5)
ALT: 22 U/L (ref 6–29)
AST: 25 U/L (ref 10–35)
Albumin: 4.7 g/dL (ref 3.6–5.1)
Alkaline phosphatase (APISO): 79 U/L (ref 37–153)
Bilirubin, Direct: 0.1 mg/dL (ref 0.0–0.2)
Globulin: 2.7 g/dL (ref 1.9–3.7)
Indirect Bilirubin: 0.3 mg/dL (ref 0.2–1.2)
Total Bilirubin: 0.4 mg/dL (ref 0.2–1.2)
Total Protein: 7.4 g/dL (ref 6.1–8.1)

## 2023-09-20 MED ORDER — DIVALPROEX SODIUM ER 500 MG PO TB24: 500.0000 mg | ORAL_TABLET | Freq: Every day | ORAL | 5 refills | Status: AC

## 2023-09-20 NOTE — Patient Instructions (Addendum)
 Increase divalproex  ER to 500mg  at bedtime.   Check CBC and liver testing today. Your provider has requested that you have labwork completed today. Please go to [facility] ([id number]) on the second floor of this building before leaving the office today. You do not need to check in. If you are not called within 15 minutes please check with the front desk.   If no improvement in 6 weeks, contact me and we can check other labs  Follow up with PCP oral surgeon regarding dental issues and possible TMJ dysfunction and PCP regarding parotid infection Follow up 6 months.

## 2023-09-21 ENCOUNTER — Ambulatory Visit: Payer: Self-pay | Admitting: Neurology

## 2023-09-21 NOTE — Progress Notes (Signed)
 Patient advised.

## 2023-09-26 DIAGNOSIS — R339 Retention of urine, unspecified: Secondary | ICD-10-CM | POA: Diagnosis not present

## 2023-09-27 DIAGNOSIS — M6289 Other specified disorders of muscle: Secondary | ICD-10-CM | POA: Diagnosis not present

## 2023-09-27 DIAGNOSIS — R339 Retention of urine, unspecified: Secondary | ICD-10-CM | POA: Diagnosis not present

## 2023-10-06 DIAGNOSIS — R350 Frequency of micturition: Secondary | ICD-10-CM | POA: Diagnosis not present

## 2023-10-06 DIAGNOSIS — M6289 Other specified disorders of muscle: Secondary | ICD-10-CM | POA: Diagnosis not present

## 2023-10-06 DIAGNOSIS — N398 Other specified disorders of urinary system: Secondary | ICD-10-CM | POA: Diagnosis not present

## 2023-10-20 DIAGNOSIS — M461 Sacroiliitis, not elsewhere classified: Secondary | ICD-10-CM | POA: Diagnosis not present

## 2023-10-20 DIAGNOSIS — G894 Chronic pain syndrome: Secondary | ICD-10-CM | POA: Diagnosis not present

## 2023-10-20 DIAGNOSIS — M47816 Spondylosis without myelopathy or radiculopathy, lumbar region: Secondary | ICD-10-CM | POA: Diagnosis not present

## 2023-10-20 DIAGNOSIS — G4700 Insomnia, unspecified: Secondary | ICD-10-CM | POA: Diagnosis not present

## 2023-10-24 ENCOUNTER — Other Ambulatory Visit: Payer: Self-pay | Admitting: Neurology

## 2023-10-26 DIAGNOSIS — R7303 Prediabetes: Secondary | ICD-10-CM | POA: Diagnosis not present

## 2023-11-02 DIAGNOSIS — Z79899 Other long term (current) drug therapy: Secondary | ICD-10-CM | POA: Diagnosis not present

## 2023-11-02 DIAGNOSIS — M810 Age-related osteoporosis without current pathological fracture: Secondary | ICD-10-CM | POA: Diagnosis not present

## 2023-11-02 DIAGNOSIS — E559 Vitamin D deficiency, unspecified: Secondary | ICD-10-CM | POA: Diagnosis not present

## 2023-11-02 DIAGNOSIS — I10 Essential (primary) hypertension: Secondary | ICD-10-CM | POA: Diagnosis not present

## 2023-11-02 DIAGNOSIS — R946 Abnormal results of thyroid function studies: Secondary | ICD-10-CM | POA: Diagnosis not present

## 2023-11-03 ENCOUNTER — Encounter: Payer: Self-pay | Admitting: Podiatry

## 2023-11-03 ENCOUNTER — Ambulatory Visit (INDEPENDENT_AMBULATORY_CARE_PROVIDER_SITE_OTHER): Admitting: Podiatry

## 2023-11-03 ENCOUNTER — Ambulatory Visit (INDEPENDENT_AMBULATORY_CARE_PROVIDER_SITE_OTHER)

## 2023-11-03 DIAGNOSIS — S99912A Unspecified injury of left ankle, initial encounter: Secondary | ICD-10-CM

## 2023-11-04 NOTE — Progress Notes (Signed)
 Subjective:   Patient ID: Carly Delacruz, female   DOB: 58 y.o.   MRN: [id number]   HPI Patient states that she severely sprained her left ankle 12 days ago and has been swollen and sore and hard to walk on.  States that she has not really had it checked up to this point and patient does not currently smoke and is not able to be active   Review of Systems  All other systems reviewed and are negative.       Objective:  Physical Exam Vitals and nursing note reviewed.  Constitutional:      Appearance: She is well-developed.  Pulmonary:     Effort: Pulmonary effort is normal.  Musculoskeletal:        General: Normal range of motion.  Skin:    General: Skin is warm.  Neurological:     Mental Status: She is alert.     Neurovascular status intact muscle strength was found to be adequate range of motion adequate with the patient noted to have splinting on the left side around the subtalar joint sinus tarsi with swelling of the lateral ankle ligament especially the anterior talofibular with pain mostly in this area.  Good digital perfusion well-oriented x 3     Assessment:  Significant ankle sprain left cannot rule out any other pathology involving at least 1 or 2 ligaments     Plan:  H&P x-rays reviewed and at this point I placed patient into a Tri-Lock ankle brace to limit motion stabilize the ankle and allow for gait.  I then discussed ice therapy elevation therapy and that this will probably take 8 to 12 weeks to heal completely.  Patient will be seen back as symptoms indicate over the next 4 to 6 weeks  X-rays indicate no signs of fracture of the ankle or diastases injury currently

## 2023-11-15 DIAGNOSIS — I10 Essential (primary) hypertension: Secondary | ICD-10-CM | POA: Diagnosis not present

## 2023-11-15 DIAGNOSIS — N1832 Chronic kidney disease, stage 3b: Secondary | ICD-10-CM | POA: Diagnosis not present

## 2023-11-15 DIAGNOSIS — Z79899 Other long term (current) drug therapy: Secondary | ICD-10-CM | POA: Diagnosis not present

## 2023-11-15 DIAGNOSIS — M810 Age-related osteoporosis without current pathological fracture: Secondary | ICD-10-CM | POA: Diagnosis not present

## 2023-11-22 ENCOUNTER — Encounter: Payer: Self-pay | Admitting: Internal Medicine

## 2023-12-07 DIAGNOSIS — M810 Age-related osteoporosis without current pathological fracture: Secondary | ICD-10-CM | POA: Diagnosis not present

## 2023-12-07 DIAGNOSIS — R7303 Prediabetes: Secondary | ICD-10-CM | POA: Diagnosis not present

## 2023-12-07 DIAGNOSIS — M51362 Other intervertebral disc degeneration, lumbar region with discogenic back pain and lower extremity pain: Secondary | ICD-10-CM | POA: Diagnosis not present

## 2023-12-07 DIAGNOSIS — E7800 Pure hypercholesterolemia, unspecified: Secondary | ICD-10-CM | POA: Diagnosis not present

## 2023-12-07 DIAGNOSIS — Z0000 Encounter for general adult medical examination without abnormal findings: Secondary | ICD-10-CM | POA: Diagnosis not present

## 2023-12-07 DIAGNOSIS — I129 Hypertensive chronic kidney disease with stage 1 through stage 4 chronic kidney disease, or unspecified chronic kidney disease: Secondary | ICD-10-CM | POA: Diagnosis not present

## 2023-12-07 DIAGNOSIS — N1831 Chronic kidney disease, stage 3a: Secondary | ICD-10-CM | POA: Diagnosis not present

## 2023-12-07 DIAGNOSIS — Z9884 Bariatric surgery status: Secondary | ICD-10-CM | POA: Diagnosis not present

## 2024-01-12 NOTE — Progress Notes (Signed)
 Ellouise Console, PA-C [address] [place], [place]  [address] Phone: [phone]   Gastroenterology Consultation  Referring Provider:     Gerome Brunet, DO Primary Care Physician:  Gerome Brunet, DO Primary Gastroenterologist:  Ellouise Console, PA-C / Dr. Gordy Starch  Reason for Consultation:     Discuss repeat colonoscopy and EGD        HPI:   Discussed the use of AI scribe software for clinical note transcription with the patient, who gave verbal consent to proceed. History of Present Illness Carly Delacruz is a 58 year old female with a history of Roux-en-Y gastric bypass who presents with gastrointestinal symptoms including diarrhea and right upper quadrant pain.  She had previous cholecystectomy 2018.  She experiences early satiety, feeling full after only two bites of food, accompanied by queasiness or nausea. She describes a stabbing pain originating in the right upper quadrant, radiating to the right upper back, which has persisted for several years. The back pain has intensified over the last three to four days. She takes opiates, Tylenol , tizanidine , and gabapentin  for back issues, which provide some relief but do not eliminate the pain.  She has had two episodes of fecal incontinence, one while standing on her porch and another while in the shower during a vacation in October. Between these episodes, her bowel movements are loose and soft, though occasionally formed. She has not undergone any stool tests previously.  Her past medical history includes Roux-en-Y gastric bypass surgery, after which she experienced significant weight loss and difficulty keeping food down, requiring Carafate and other treatments. She also had her gallbladder removed before 2018 and has a history of renal failure in October 2020, which led to medication adjustments. She sees a kidney specialist and a urogynecologist for levator ani syndrome and pelvic floor dysfunction, for which she has received  trigger point injections.  She underwent an upper endoscopy and colonoscopy in March 2015, both of which were normal. Recent lab work from July 2024 showed normal hemoglobin, white count, and metabolic panel. Celiac disease and thyroid  dysfunction have been ruled out as causes for her symptoms.  She reports constant right upper quadrant pain.  05/2013 EGD by Dr. Starch: Normal esophagus.  Roux-en-Y anastomosis with healthy appearing mucosa.  Normal Eferret jejunal limb.  04/2013 colonoscopy by Dr. Starch: Normal.  No polyps.  Good prep.  10-year repeat (due 04/2023).     Past Medical History:  Diagnosis Date   Allergy    Anemia    Anxiety    Arthritis 12/10/03   Atrophic vaginitis 2009   BV (bacterial vaginosis) 2006   Chronic back pain    Chronic LLQ pain    Chronic post-traumatic stress disorder (PTSD)    Complication of anesthesia    Elevated LFTs    Endometriosis    Fatigue 2012   Gallstones    GERD (gastroesophageal reflux disease)    H/O bladder infections    H/O mumps    H/O varicella    History of candidal vulvovaginitis 03/21/03   HTN (hypertension)    Insomnia    Insomnia secondary to situational depression 06/06/07   Liver mass, right lobe 08/13/04   Nodule   Malaise 2012   Morbid obesity (HCC) 2005   Osteoporosis    Pelvic floor dysfunction    S/P gastric bypass    Severe episode of recurrent major depressive disorder, without psychotic features (HCC)    Sleep apnea 2005   SUI (  stress urinary incontinence, female) 08/12/03   Trichomonas    Vitamin D  deficiency    Vulvar lesion 03/21/03   Yeast infection     Past Surgical History:  Procedure Laterality Date   ABDOMINAL HYSTERECTOMY  2001   APPENDECTOMY  2001   CHOLECYSTECTOMY  2011   DILATION AND CURETTAGE OF UTERUS     GASTRIC BYPASS  2007   [facility], Dr. Gladis   KNEE ARTHROSCOPY Left    LAPAROTOMY     TONSILLECTOMY     WISDOM TOOTH EXTRACTION      Prior to Admission medications    Medication Sig Start Date End Date Taking? Authorizing Provider  acetaminophen  (TYLENOL ) 500 MG tablet Take 500 mg by mouth. 06/29/17   [provider]  amLODipine -valsartan  (EXFORGE ) 5-160 MG tablet Take 1 tablet by mouth daily. 08/21/14   [provider]  atorvastatin  (LIPITOR) 40 MG tablet Take 1 tablet (40 mg total) by mouth daily for cholesterol 01/14/22     buprenorphine  (BUTRANS ) 5 MCG/HR PTWK Place onto the skin once a week.    [provider]  COVID-19 At Home Antigen Test [facility] COVID-19 RAPID TEST) KIT See Admin Instructions.    [provider]  diazepam  (VALIUM ) 5 MG tablet Insert 1 tablet (5 mg total) into the vagina every 8 hours as needed for pelvic floor spasm. 11/26/21     divalproex  (DEPAKOTE  ER) 500 MG 24 hr tablet Take 1 tablet (500 mg total) by mouth at bedtime. 09/20/23   Skeet Juliene SAUNDERS, DO  FLUoxetine  (PROZAC ) 20 MG capsule Take 1 capsule (20 mg total) by mouth daily. 11/09/21     FLUoxetine  (PROZAC ) 40 MG capsule Take 1 capsule (40 mg total) by mouth daily. 10/03/17   Eksir, Marsa Jasper, MD  furosemide  (LASIX ) 20 MG tablet Take 1 tablet (20 mg total) by mouth daily as needed for fluid or edema. 03/05/21   Swinyer, Rosaline HERO, NP  gabapentin  (NEURONTIN ) 100 MG capsule Take 200 mg by mouth 4 (four) times daily. 08/24/22   [provider]  Menthol, Topical Analgesic, (BIOFREEZE ROLL-ON) 4 % GEL     [provider]  Multiple Vitamin (MULTI VITAMIN) TABS 1 tablet Orally Once a day 05/31/19   [provider]  potassium chloride  (KLOR-CON  M) 10 MEQ tablet TAKE 1 TABLET BY MOUTH AS NEEDED(TAKE WITH FUROSEMIDE ) 02/09/23   Walker, Caitlin S, NP  QUEtiapine  (SEROQUEL ) 300 MG tablet Take 300 mg by mouth at bedtime.    [provider]  QUEtiapine  Fumarate 150 MG TABS Take 1 tablet by mouth daily. 07/16/22   [provider]  sodium chloride  (OCEAN) 0.65 % SOLN nasal spray Place 1 spray into both nostrils as needed for  congestion. 12/26/21   Briana Elgin LABOR, MD  tiZANidine  (ZANAFLEX ) 2 MG tablet Take 1 tablet (2 mg total) by mouth 3 (three) times daily as needed. Patient taking differently: Take 4 mg by mouth 3 (three) times daily as needed for muscle spasms. 01/18/22     tiZANidine  (ZANAFLEX ) 4 MG tablet Take 1 tablet by mouth every eight hours as needed for spasm/pain 04/11/23     tiZANidine  (ZANAFLEX ) 4 MG tablet Take 1 tablet by mouth every eight hours as needed for spasm/pain 06/15/23     Turmeric POWD Take 1 each by mouth daily.    [provider]  zoledronic  acid (RECLAST ) 5 MG/100ML SOLN injection as directed Intravenous    [provider]    Family History  Problem  Relation Age of Onset   Diabetes Paternal Grandmother    Leukemia Maternal Grandmother    Arthritis Mother    High blood pressure Mother    Heart attack Mother    Prostate cancer Father    Cirrhosis Father    Colitis Sister    Hypertension Sister    Deep vein thrombosis Sister    Kidney disease Brother    Obstructive Sleep Apnea Brother    Colon polyps Neg Hx    Pancreatic cancer Neg Hx    Esophageal cancer Neg Hx    Stomach cancer Neg Hx      Social History   Tobacco Use   Smoking status: Never    Passive exposure: Never   Smokeless tobacco: Never  Vaping Use   Vaping status: Never Used  Substance Use Topics   Alcohol use: No   Drug use: No    Allergies as of 01/13/2024 - Review Complete 01/13/2024  Allergen Reaction Noted   Ciprofloxacin  Other (See Comments) and Anaphylaxis 07/25/2014   Morphine and codeine Shortness Of Breath and Itching 08/20/2011   Septra [sulfamethoxazole-trimethoprim] Anaphylaxis and Hives 08/20/2011   Sulfa antibiotics Anaphylaxis, Hives, and Swelling 05/08/2013   Sulfamethoxazole-trimethoprim Anaphylaxis and Hives 07/25/2014   Sulfasalazine Anaphylaxis, Hives, Swelling, and Other (See Comments) 07/25/2014   Tramadol Anxiety, Other (See Comments), Shortness Of Breath, and  Anaphylaxis 08/20/2011   Darvocet [propoxyphene n-acetaminophen ] Itching 08/20/2011   Lisinopril  Cough 01/06/2021   Codeine Itching and Other (See Comments) 08/20/2011   Demerol [meperidine] Nausea Only 07/17/2013   Hydromorphone  Itching and Other (See Comments) 07/25/2014   Losartan Palpitations 07/25/2014   Tape Rash and Dermatitis 05/24/2013    Review of Systems:    All systems reviewed and negative except where noted in HPI.   Physical Exam:  BP (!) 130/90   Pulse (!) 102   Ht 5' 3 (1.6 m)   Wt 266 lb (120.7 kg)   BMI 47.12 kg/m  No LMP recorded. Patient has had a hysterectomy.  General:   Alert,  Well-developed, well-nourished, morbidly obese, pleasant and cooperative in NAD Lungs:  Respirations even and unlabored.  Clear throughout to auscultation.   No wheezes, crackles, or rhonchi. No acute distress. Heart:  Regular rate and rhythm; no murmurs, clicks, rubs, or gallops. Abdomen:  Normal bowel sounds.  No bruits.  Soft, and non-distended without masses, hepatosplenomegaly or hernias noted.  Mild RUQ tenderness.  No other abdominal tenderness.  No guarding or rebound tenderness.    Neurologic:  Alert and oriented x3;  grossly normal neurologically. Psych:  Alert and cooperative. Normal mood and affect.   Imaging Studies: No results found.  Labs: CBC    Component Value Date/Time   WBC 7.0 09/20/2023 1101   RBC 4.87 09/20/2023 1101   HGB 13.6 09/20/2023 1101   HGB 12.3 03/20/2018 1428   HCT 43.3 09/20/2023 1101   HCT 38.7 03/20/2018 1428   PLT 319 09/20/2023 1101   PLT 363 03/20/2018 1428   MCV 88.9 09/20/2023 1101   MCV 82 03/20/2018 1428    CMP     Component Value Date/Time   NA 142 04/27/2022 1225   NA 143 03/20/2018 1428   K 4.0 04/27/2022 1225   CL 103 04/27/2022 1225   CO2 30 04/27/2022 1225   GLUCOSE 97 04/27/2022 1225   BUN 7 04/27/2022 1225   BUN 10 03/20/2018 1428   CREATININE 0.94 04/27/2022 1225   CALCIUM  9.8 04/27/2022 1225   PROT 7.4  09/20/2023 1101   PROT 6.5 02/06/2020 1204   ALBUMIN 4.3 09/07/2022 1143   ALBUMIN 4.0 02/06/2020 1204   AST 25 09/20/2023 1101   ALT 22 09/20/2023 1101   ALKPHOS 96 09/07/2022 1143   BILITOT 0.4 09/20/2023 1101   BILITOT 0.3 02/06/2020 1204   GFRNONAA 31 (L) 12/26/2021 1225   GFRAA 70 03/20/2018 1428    Assessment and Plan:   JEANEE FABRE is a 58 y.o. y/o female has been referred for:  Assessment & Plan 1.  Chronic diarrhea with history of Roux-en-Y gastric bypass and cholecystectomy.  Chronic diarrhea postprandially. Differential includes pancreatic insufficiency bile salt diarrhea postcholecystectomy, and microscopic colitis. Celiac disease and thyroid  function tests negative. - Ordered fecal pancreatic elastase test. - Stool Studies: GI Pathogen Panal,  Fecal Calprotectin, fecal pancreatic elastase. - Ordered colonoscopy to check for microscopic colitis.  2.  Chronic right upper quadrant and right upper back pain Chronic right upper quadrant pain with recent exacerbation to back. Likely musculoskeletal.  Biliary or gastrointestinal source unlikely. Partial relief with Tylenol , tizanidine , and gabapentin . - Continue current pain management regimen.  3.  Colon cancer screening - Currently due for 10-year repeat screening colonoscopy. - Scheduling colonoscopy.  4.  Early satiety and upper abdominal pain.  History of gastric bypass. - Scheduling EGD I discussed risks of EGD with patient to include risk of bleeding, perforation, and risk of sedation.  Patient expressed understanding and agrees to proceed with EGD.     Follow up based on procedure results and GI symptoms.  Ellouise Console, PA-C

## 2024-01-13 ENCOUNTER — Encounter: Payer: Self-pay | Admitting: Physician Assistant

## 2024-01-13 ENCOUNTER — Ambulatory Visit: Admitting: Physician Assistant

## 2024-01-13 ENCOUNTER — Other Ambulatory Visit

## 2024-01-13 VITALS — BP 130/90 | HR 102 | Ht 63.0 in | Wt 266.0 lb

## 2024-01-13 DIAGNOSIS — R197 Diarrhea, unspecified: Secondary | ICD-10-CM

## 2024-01-13 DIAGNOSIS — Z1211 Encounter for screening for malignant neoplasm of colon: Secondary | ICD-10-CM | POA: Diagnosis not present

## 2024-01-13 DIAGNOSIS — R6881 Early satiety: Secondary | ICD-10-CM

## 2024-01-13 DIAGNOSIS — R1011 Right upper quadrant pain: Secondary | ICD-10-CM

## 2024-01-13 DIAGNOSIS — Z9884 Bariatric surgery status: Secondary | ICD-10-CM | POA: Diagnosis not present

## 2024-01-13 DIAGNOSIS — R1013 Epigastric pain: Secondary | ICD-10-CM

## 2024-01-13 MED ORDER — NA SULFATE-K SULFATE-MG SULF 17.5-3.13-1.6 GM/177ML PO SOLN: 1.0000 | Freq: Once | ORAL | 0 refills | Status: AC

## 2024-01-13 NOTE — Patient Instructions (Addendum)
 Your provider has requested that you go to the basement level for lab work before leaving today. Press B on the elevator. The lab is located at the first door on the left as you exit the elevator.  You have been scheduled for an endoscopy and colonoscopy. Please follow the written instructions given to you at your visit today.  If you use inhalers (even only as needed), please bring them with you on the day of your procedure.  DO NOT TAKE 7 DAYS PRIOR TO TEST- Trulicity (dulaglutide) Ozempic, Wegovy (semaglutide) Mounjaro, Zepbound (tirzepatide) Bydureon Bcise (exanatide extended release)  DO NOT TAKE 1 DAY PRIOR TO YOUR TEST Rybelsus (semaglutide) Adlyxin (lixisenatide) Victoza (liraglutide) Byetta (exanatide) ___________________________________________________________________________ Please follow up sooner if symptoms increase or worsen  Due to recent changes in healthcare laws, you may see the results of your imaging and laboratory studies on MyChart before your provider has had a chance to review them.  We understand that in some cases there may be results that are confusing or concerning to you. Not all laboratory results come back in the same time frame and the provider may be waiting for multiple results in order to interpret others.  Please give us  48 hours in order for your provider to thoroughly review all the results before contacting the office for clarification of your results.   Thank you for trusting me with your gastrointestinal care!   Ellouise Console, PA-C _______________________________________________________  If your blood pressure at your visit was 140/90 or greater, please contact your primary care physician to follow up on this.  _______________________________________________________  If you are age 46 or older, your body mass index should be between 23-30. Your Body mass index is 47.12 kg/m. If this is out of the aforementioned range listed, please consider  follow up with your Primary Care Provider.  If you are age 39 or younger, your body mass index should be between 19-25. Your Body mass index is 47.12 kg/m. If this is out of the aformentioned range listed, please consider follow up with your Primary Care Provider.   ________________________________________________________  The [facility] providers would like to encourage you to use MYCHART to communicate with providers for non-urgent requests or questions.  Due to long hold times on the telephone, sending your provider a message by [facility] may be a faster and more efficient way to get a response.  Please allow 48 business hours for a response.  Please remember that this is for non-urgent requests.  _______________________________________________________

## 2024-01-28 ENCOUNTER — Encounter: Payer: Self-pay | Admitting: Internal Medicine

## 2024-02-15 NOTE — Progress Notes (Signed)
 Addendum: Reviewed and agree with assessment and management plan. Patient has not returned stool studies.  Alan, can you request her to do so. Accalia Rigdon, Gordy HERO, MD

## 2024-02-17 ENCOUNTER — Encounter: Payer: Self-pay | Admitting: Internal Medicine

## 2024-02-17 NOTE — Progress Notes (Signed)
Called patient with no answer and voicemail box is full. Could not leave a message. 

## 2024-02-20 NOTE — Progress Notes (Signed)
 Called patient with no answer and voicemail box if full

## 2024-02-21 NOTE — Progress Notes (Signed)
Called patient with no answer and voicemail box is full. °

## 2024-02-28 ENCOUNTER — Ambulatory Visit: Admitting: Internal Medicine

## 2024-02-28 ENCOUNTER — Encounter: Payer: Self-pay | Admitting: Internal Medicine

## 2024-02-28 VITALS — BP 132/86 | HR 91 | Temp 97.3°F | Resp 12 | Ht 63.0 in | Wt 266.0 lb

## 2024-02-28 DIAGNOSIS — Z9884 Bariatric surgery status: Secondary | ICD-10-CM

## 2024-02-28 DIAGNOSIS — Z1211 Encounter for screening for malignant neoplasm of colon: Secondary | ICD-10-CM | POA: Diagnosis not present

## 2024-02-28 DIAGNOSIS — R6881 Early satiety: Secondary | ICD-10-CM

## 2024-02-28 DIAGNOSIS — K5520 Angiodysplasia of colon without hemorrhage: Secondary | ICD-10-CM | POA: Diagnosis not present

## 2024-02-28 DIAGNOSIS — Z980 Intestinal bypass and anastomosis status: Secondary | ICD-10-CM

## 2024-02-28 DIAGNOSIS — R1011 Right upper quadrant pain: Secondary | ICD-10-CM

## 2024-02-28 DIAGNOSIS — K529 Noninfective gastroenteritis and colitis, unspecified: Secondary | ICD-10-CM

## 2024-02-28 MED ORDER — SODIUM CHLORIDE 0.9 % IV SOLN: 500.0000 mL | INTRAVENOUS | Status: DC

## 2024-02-28 NOTE — Op Note (Signed)
 [facility] Patient Name: Carly Delacruz Procedure Date: 02/28/2024 1:26 PM MRN: [id number] Endoscopist: Gordy CHRISTELLA Starch , MD, [id number] Age: 58 Referring MD:  Date of Birth: October 08, 1965 Gender: Female Account #: [id number] Procedure:                Colonoscopy Indications:              Screening for colorectal malignant neoplasm, Last                            colonoscopy 10 years ago Medicines:                Monitored Anesthesia Care Procedure:                Pre-Anesthesia Assessment:                           - Prior to the procedure, a History and Physical                            was performed, and patient medications and                            allergies were reviewed. The patient's tolerance of                            previous anesthesia was also reviewed. The risks                            and benefits of the procedure and the sedation                            options and risks were discussed with the patient.                            All questions were answered, and informed consent                            was obtained. Prior Anticoagulants: The patient has                            taken no anticoagulant or antiplatelet agents. ASA                            Grade Assessment: III - A patient with severe                            systemic disease. After reviewing the risks and                            benefits, the patient was deemed in satisfactory                            condition to undergo the procedure.  After obtaining informed consent, the colonoscope                            was passed under direct vision. Throughout the                            procedure, the patient's blood pressure, pulse, and                            oxygen saturations were monitored continuously. The                            Olympus Scope [id number] was introduced through the                            anus and advanced to the  cecum, identified by                            appendiceal orifice and ileocecal valve. The                            colonoscopy was performed without difficulty. The                            patient tolerated the procedure well. The quality                            of the bowel preparation was excellent. The                            ileocecal valve, appendiceal orifice, and rectum                            were photographed. Scope In: 1:57:33 PM Scope Out: 2:13:50 PM Scope Withdrawal Time: 0 hours 11 minutes 19 seconds  Total Procedure Duration: 0 hours 16 minutes 17 seconds  Findings:                 The digital rectal exam was normal.                           The entire examined colon appeared normal on direct                            and retroflexion views. Complications:            No immediate complications. Estimated Blood Loss:     Estimated blood loss: none. Impression:               - The entire examined colon is normal on direct and                            retroflexion views.                           - No specimens collected. Recommendation:           -  Patient has a contact number available for                            emergencies. The signs and symptoms of potential                            delayed complications were discussed with the                            patient. Return to normal activities tomorrow.                            Written discharge instructions were provided to the                            patient.                           - Resume previous diet.                           - Continue present medications.                           - Repeat colonoscopy in 10 years for screening                            purposes. Gordy CHRISTELLA Starch, MD 02/28/2024 2:22:16 PM This report has been signed electronically.

## 2024-02-28 NOTE — Progress Notes (Signed)
 Sedate, gd SR, tolerated procedure well, VSS, report to RN

## 2024-02-28 NOTE — Patient Instructions (Signed)
Discharge instructions given. Normal exam. Resume previous medications. YOU HAD AN ENDOSCOPIC PROCEDURE TODAY AT THE [facility]:   Refer to the procedure report that was given to you for any specific questions about what was found during the examination.  If the procedure report does not answer your questions, please call your gastroenterologist to clarify.  If you requested that your care partner not be given the details of your procedure findings, then the procedure report has been included in a sealed envelope for you to review at your convenience later.  YOU SHOULD EXPECT: Some feelings of bloating in the abdomen. Passage of more gas than usual.  Walking can help get rid of the air that was put into your GI tract during the procedure and reduce the bloating. If you had a lower endoscopy (such as a colonoscopy or flexible sigmoidoscopy) you may notice spotting of blood in your stool or on the toilet paper. If you underwent a bowel prep for your procedure, you may not have a normal bowel movement for a few days.  Please Note:  You might notice some irritation and congestion in your nose or some drainage.  This is from the oxygen used during your procedure.  There is no need for concern and it should clear up in a day or so.  SYMPTOMS TO REPORT IMMEDIATELY:  Following lower endoscopy (colonoscopy or flexible sigmoidoscopy):  Excessive amounts of blood in the stool  Significant tenderness or worsening of abdominal pains  Swelling of the abdomen that is new, acute  Fever of 100F or higher  Following upper endoscopy (EGD)  Vomiting of blood or coffee ground material  New chest pain or pain under the shoulder blades  Painful or persistently difficult swallowing  New shortness of breath  Fever of 100F or higher  Black, tarry-looking stools  For urgent or emergent issues, a gastroenterologist can be reached at any hour by calling [phone]. Do not use MyChart messaging for  urgent concerns.    DIET:  We do recommend a small meal at first, but then you may proceed to your regular diet.  Drink plenty of fluids but you should avoid alcoholic beverages for 24 hours.  ACTIVITY:  You should plan to take it easy for the rest of today and you should NOT DRIVE or use heavy machinery until tomorrow (because of the sedation medicines used during the test).    FOLLOW UP: Our staff will call the number listed on your records the next business day following your procedure.  We will call around 7:15- 8:00 am to check on you and address any questions or concerns that you may have regarding the information given to you following your procedure. If we do not reach you, we will leave a message.     If any biopsies were taken you will be contacted by phone or by letter within the next 1-3 weeks.  Please call us at [phone] if you have not heard about the biopsies in 3 weeks.    SIGNATURES/CONFIDENTIALITY: You and/or your care partner have signed paperwork which will be entered into your electronic medical record.  These signatures attest to the fact that that the information above on your After Visit Summary has been reviewed and is understood.  Full responsibility of the confidentiality of this discharge information lies with you and/or your care-partner. 

## 2024-02-28 NOTE — Op Note (Signed)
 [facility] Patient Name: Carly Delacruz Procedure Date: 02/28/2024 1:36 PM MRN: [id number] Endoscopist: Gordy CHRISTELLA Starch , MD, [id number] Age: 58 Referring MD:  Date of Birth: 1966-02-05 Gender: Female Account #: [id number] Procedure:                Upper GI endoscopy Indications:              Epigastric abdominal pain, Abdominal pain in the                            right upper quadrant Medicines:                Monitored Anesthesia Care Procedure:                Pre-Anesthesia Assessment:                           - Prior to the procedure, a History and Physical                            was performed, and patient medications and                            allergies were reviewed. The patient's tolerance of                            previous anesthesia was also reviewed. The risks                            and benefits of the procedure and the sedation                            options and risks were discussed with the patient.                            All questions were answered, and informed consent                            was obtained. Prior Anticoagulants: The patient has                            taken no anticoagulant or antiplatelet agents. ASA                            Grade Assessment: III - A patient with severe                            systemic disease. After reviewing the risks and                            benefits, the patient was deemed in satisfactory                            condition to undergo the procedure.  After obtaining informed consent, the endoscope was                            passed under direct vision. Throughout the                            procedure, the patient's blood pressure, pulse, and                            oxygen saturations were monitored continuously. The                            GIF [id number] #[id number] was introduced through the                            mouth, and advanced to the  efferent jejunal loop.                            The upper GI endoscopy was accomplished without                            difficulty. The patient tolerated the procedure                            well. Scope In: Scope Out: Findings:                 The examined esophagus was normal.                           Evidence of a Roux-en-Y gastrojejunostomy was                            found. The gastrojejunal anastomosis was                            characterized by healthy appearing mucosa. The                            excluded stomach was not examined as it could not                            be found.                           A single diminutive angioectasia without bleeding                            was found in the jejunum.                           Exam of the jejunum was otherwise normal. Complications:            No immediate complications. Estimated Blood Loss:     Estimated blood loss: none. Impression:               - Normal  esophagus.                           - Roux-en-Y gastrojejunostomy with gastrojejunal                            anastomosis characterized by healthy appearing                            mucosa.                           - Normal gastric pouch.                           - A single non-bleeding angioectasia in the                            efferent jejunal limb.                           - Normal examined efferent jejunal limb.                           - No specimens collected. Recommendation:           - Patient has a contact number available for                            emergencies. The signs and symptoms of potential                            delayed complications were discussed with the                            patient. Return to normal activities tomorrow.                            Written discharge instructions were provided to the                            patient.                           - Resume previous diet.                            - Continue present medications.                           - Proceed with CT scan abd/pelvis for further                            evaluation of RUQ pain.                           - See the other procedure note for documentation of  additional recommendations. Gordy CHRISTELLA Starch, MD 02/28/2024 2:20:46 PM This report has been signed electronically.

## 2024-02-28 NOTE — Progress Notes (Signed)
 "   GASTROENTEROLOGY PROCEDURE H&P NOTE   Primary Care Physician: Gerome Brunet, DO    Reason for Procedure:  Chronic diarrhea, early satiety, upper and right upper quadrant abdominal pain, prior gastric bypass  Plan:    EGD and colonoscopy  Patient is appropriate for endoscopic procedure(s) in the ambulatory ([facility]) setting.  The nature of the procedure, as well as the risks, benefits, and alternatives were carefully and thoroughly reviewed with the patient. Ample time for discussion and questions allowed.  All questions were answered. The patient understood, was satisfied, and agreed with the plan to proceed.    HPI: Carly Delacruz is a 58 y.o. female who presents for EGD and colonoscopy.  Medical history as below.  Tolerated the prep.  No recent chest pain or shortness of breath.  No abdominal pain today.  Past Medical History:  Diagnosis Date   Allergy    Anemia    Anxiety    Arthritis 12/10/2003   Atrophic vaginitis 2009   BV (bacterial vaginosis) 2006   Chronic back pain    Chronic kidney disease    Chronic LLQ pain    Chronic post-traumatic stress disorder (PTSD)    Complication of anesthesia    Elevated LFTs    Endometriosis    Fatigue 2012   Gallstones    GERD (gastroesophageal reflux disease)    H/O bladder infections    H/O mumps    H/O varicella    History of candidal vulvovaginitis 03/21/2003   HTN (hypertension)    Hyperlipidemia    Insomnia    Insomnia secondary to situational depression 06/06/2007   Liver mass, right lobe 08/13/2004   Nodule   Malaise 2012   Morbid obesity (HCC) 2005   Osteoporosis    Pelvic floor dysfunction    S/P gastric bypass    Severe episode of recurrent major depressive disorder, without psychotic features (HCC)    Sleep apnea 2005   SUI (stress urinary incontinence, female) 08/12/2003   Trichomonas    Vitamin D  deficiency    Vulvar lesion 03/21/2003   Yeast infection     Past Surgical History:  Procedure  Laterality Date   ABDOMINAL HYSTERECTOMY  2001   APPENDECTOMY  2001   CHOLECYSTECTOMY  2011   DILATION AND CURETTAGE OF UTERUS     GASTRIC BYPASS  2007   [facility], Dr. Gladis   KNEE ARTHROSCOPY Left    LAPAROTOMY     TONSILLECTOMY     WISDOM TOOTH EXTRACTION      Prior to Admission medications  Medication Sig Start Date End Date Taking? Authorizing Provider  acetaminophen  (TYLENOL ) 500 MG tablet Take 500 mg by mouth. 06/29/17  Yes [provider]  amLODipine  (NORVASC ) 5 MG tablet Take 5 mg by mouth daily. 02/27/24  Yes [provider]  atorvastatin  (LIPITOR) 40 MG tablet Take 1 tablet (40 mg total) by mouth daily for cholesterol 01/14/22  Yes   diazepam  (VALIUM ) 5 MG tablet Insert 1 tablet (5 mg total) into the vagina every 8 hours as needed for pelvic floor spasm. 11/26/21  Yes   FLUoxetine  (PROZAC ) 20 MG capsule Take 1 capsule (20 mg total) by mouth daily. 11/09/21  Yes   FLUoxetine  (PROZAC ) 40 MG capsule Take 1 capsule (40 mg total) by mouth daily. 10/03/17  Yes Eksir, Marsa Jasper, MD  gabapentin  (NEURONTIN ) 100 MG capsule Take 200 mg by mouth 4 (four) times daily. 08/24/22  Yes [provider]  QUEtiapine  (SEROQUEL ) 300 MG  tablet Take 300 mg by mouth at bedtime.   Yes [provider]  QUEtiapine  Fumarate 150 MG TABS Take 1 tablet by mouth daily. 07/16/22  Yes [provider]  tiZANidine  (ZANAFLEX ) 2 MG tablet Take 1 tablet (2 mg total) by mouth 3 (three) times daily as needed. Patient taking differently: Take 4 mg by mouth 3 (three) times daily as needed for muscle spasms. 01/18/22  Yes   tiZANidine  (ZANAFLEX ) 4 MG tablet Take 1 tablet by mouth every eight hours as needed for spasm/pain 04/11/23  Yes   buprenorphine  (BUTRANS ) 5 MCG/HR PTWK Place onto the skin once a week. Patient not taking: Reported on 02/28/2024    [provider]  COVID-19 At Home Antigen Test [facility] COVID-19 RAPID TEST) KIT See Admin Instructions.     [provider]  divalproex  (DEPAKOTE  ER) 500 MG 24 hr tablet Take 1 tablet (500 mg total) by mouth at bedtime. Patient not taking: Reported on 02/28/2024 09/20/23   Skeet Juliene SAUNDERS, DO  furosemide  (LASIX ) 20 MG tablet Take 1 tablet (20 mg total) by mouth daily as needed for fluid or edema. 03/05/21   Swinyer, Rosaline HERO, NP  Menthol, Topical Analgesic, (BIOFREEZE ROLL-ON) 4 % GEL     [provider]  Multiple Vitamin (MULTI VITAMIN) TABS 1 tablet Orally Once a day 05/31/19   [provider]  potassium chloride  (KLOR-CON  M) 10 MEQ tablet TAKE 1 TABLET BY MOUTH AS NEEDED(TAKE WITH FUROSEMIDE ) 02/09/23   Walker, Caitlin S, NP  sodium chloride  (OCEAN) 0.65 % SOLN nasal spray Place 1 spray into both nostrils as needed for congestion. 12/26/21   Briana Elgin LABOR, MD  tiZANidine  (ZANAFLEX ) 4 MG tablet Take 1 tablet by mouth every eight hours as needed for spasm/pain 06/15/23     Turmeric POWD Take 1 each by mouth daily.    [provider]  zoledronic  acid (RECLAST ) 5 MG/100ML SOLN injection as directed Intravenous    [provider]    Current Outpatient Medications  Medication Sig Dispense Refill   acetaminophen  (TYLENOL ) 500 MG tablet Take 500 mg by mouth.     amLODipine  (NORVASC ) 5 MG tablet Take 5 mg by mouth daily.     atorvastatin  (LIPITOR) 40 MG tablet Take 1 tablet (40 mg total) by mouth daily for cholesterol 90 tablet 3   diazepam  (VALIUM ) 5 MG tablet Insert 1 tablet (5 mg total) into the vagina every 8 hours as needed for pelvic floor spasm. 60 tablet 2   FLUoxetine  (PROZAC ) 20 MG capsule Take 1 capsule (20 mg total) by mouth daily. 90 capsule 1   FLUoxetine  (PROZAC ) 40 MG capsule Take 1 capsule (40 mg total) by mouth daily. 90 capsule 0   gabapentin  (NEURONTIN ) 100 MG capsule Take 200 mg by mouth 4 (four) times daily.     QUEtiapine  (SEROQUEL ) 300 MG tablet Take 300 mg by mouth at bedtime.     QUEtiapine  Fumarate 150 MG TABS Take 1 tablet by mouth  daily.     tiZANidine  (ZANAFLEX ) 2 MG tablet Take 1 tablet (2 mg total) by mouth 3 (three) times daily as needed. (Patient taking differently: Take 4 mg by mouth 3 (three) times daily as needed for muscle spasms.) 90 tablet 0   tiZANidine  (ZANAFLEX ) 4 MG tablet Take 1 tablet by mouth every eight hours as needed for spasm/pain 40 tablet 0   buprenorphine  (BUTRANS ) 5 MCG/HR PTWK Place onto the skin once a week. (Patient not taking: Reported on 02/28/2024)  COVID-19 At Home Antigen Test [facility] COVID-19 RAPID TEST) KIT See Admin Instructions.     divalproex  (DEPAKOTE  ER) 500 MG 24 hr tablet Take 1 tablet (500 mg total) by mouth at bedtime. (Patient not taking: Reported on 02/28/2024) 30 tablet 5   furosemide  (LASIX ) 20 MG tablet Take 1 tablet (20 mg total) by mouth daily as needed for fluid or edema. 90 tablet 3   Menthol, Topical Analgesic, (BIOFREEZE ROLL-ON) 4 % GEL      Multiple Vitamin (MULTI VITAMIN) TABS 1 tablet Orally Once a day     potassium chloride  (KLOR-CON  M) 10 MEQ tablet TAKE 1 TABLET BY MOUTH AS NEEDED(TAKE WITH FUROSEMIDE ) 90 tablet 2   sodium chloride  (OCEAN) 0.65 % SOLN nasal spray Place 1 spray into both nostrils as needed for congestion.  0   tiZANidine  (ZANAFLEX ) 4 MG tablet Take 1 tablet by mouth every eight hours as needed for spasm/pain 40 tablet 0   Turmeric POWD Take 1 each by mouth daily.     zoledronic  acid (RECLAST ) 5 MG/100ML SOLN injection as directed Intravenous     Current Facility-Administered Medications  Medication Dose Route Frequency Provider Last Rate Last Admin   0.9 %  sodium chloride  infusion  500 mL Intravenous Continuous Yanelly Cantrelle, Gordy HERO, MD        Allergies as of 02/28/2024 - Review Complete 02/28/2024  Allergen Reaction Noted   Ciprofloxacin  Other (See Comments) and Anaphylaxis 07/25/2014   Morphine and codeine Shortness Of Breath and Itching 08/20/2011   Septra [sulfamethoxazole-trimethoprim] Anaphylaxis and Hives 08/20/2011   Sulfa antibiotics  Anaphylaxis, Hives, and Swelling 05/08/2013   Sulfamethoxazole-trimethoprim Anaphylaxis and Hives 07/25/2014   Sulfasalazine Anaphylaxis, Hives, Swelling, and Other (See Comments) 07/25/2014   Tramadol Anxiety, Other (See Comments), Shortness Of Breath, and Anaphylaxis 08/20/2011   Darvocet [propoxyphene n-acetaminophen ] Itching 08/20/2011   Lisinopril  Cough 01/06/2021   Penicillins Itching 02/28/2024   Azithromycin Other (See Comments) 02/28/2024   Codeine Itching and Other (See Comments) 08/20/2011   Demerol [meperidine] Nausea Only 07/17/2013   Hydromorphone  Itching and Other (See Comments) 07/25/2014   Latex Itching 02/04/2015   Losartan Palpitations 07/25/2014   Tape Rash and Dermatitis 05/24/2013    Family History  Problem Relation Age of Onset   Arthritis Mother    High blood pressure Mother    Heart attack Mother    Prostate cancer Father    Cirrhosis Father    Colitis Sister    Hypertension Sister    Deep vein thrombosis Sister    Kidney disease Brother    Obstructive Sleep Apnea Brother    Leukemia Maternal Grandmother    Diabetes Paternal Grandmother    Colon polyps Neg Hx    Pancreatic cancer Neg Hx    Esophageal cancer Neg Hx    Stomach cancer Neg Hx    Colon cancer Neg Hx    Rectal cancer Neg Hx     Social History   Socioeconomic History   Marital status: Single    Spouse name: Not on file   Number of children: 0   Years of education: Not on file   Highest education level: Bachelor's degree (e.g., BA, AB, BS)  Occupational History   Occupation: DISABILITY  Tobacco Use   Smoking status: Never    Passive exposure: Never   Smokeless tobacco: Never  Vaping Use   Vaping status: Never Used  Substance and Sexual Activity   Alcohol use: No   Drug use: No   Sexual activity: Never  Birth control/protection: Surgical    Comment: hysterectomy   Other Topics Concern   Not on file  Social History Narrative   Patient is [place]. Her mother is living with  her in a one level house, 2 steps to enter.    Social Drivers of Health   Tobacco Use: Low Risk (02/28/2024)   Patient History    Smoking Tobacco Use: Never    Smokeless Tobacco Use: Never    Passive Exposure: Never  Financial Resource Strain: Not on file  Food Insecurity: Low Risk (10/06/2023)   Received from [facility]   Epic    Within the past 12 months, the food you bought just didn't last and you didn't have money to get more. : Never true    Within the past 12 months, you worried that your food would run out before you got money to buy more: Never true  Transportation Needs: No Transportation Needs (10/06/2023)   Received from [employer]    In the past 12 months, has lack of reliable transportation kept you from medical appointments, meetings, work or from getting things needed for daily living? : No  Physical Activity: Not on file  Stress: Not on file  Social Connections: Not on file  Intimate Partner Violence: Not At Risk (12/24/2021)   Humiliation, Afraid, Rape, and Kick questionnaire    Fear of Current or Ex-Partner: No    Emotionally Abused: No    Physically Abused: No    Sexually Abused: No  Depression (PHQ2-9): Low Risk (04/28/2022)   Depression (PHQ2-9)    PHQ-2 Score: 1  Alcohol Screen: Not on file  Housing: Low Risk (10/06/2023)   Received from [facility]   Epic    Think about the place you live. Do you have problems with any of the following? Choose all that apply:: None/None on this list    What is your living situation today?: I have a steady place to live  Utilities: Low Risk (10/06/2023)   Received from [facility]   Utilities    In the past 12 months has the electric, gas, oil, or water company threatened to shut off services in your home? : No  Health Literacy: Not on file    Physical Exam: Vital signs in last 24 hours: @BP  123/69   Pulse 96   Temp (!) 97.3 F (36.3 C) (Temporal)   Ht 5' 3 (1.6 m)   Wt 266 lb (120.7 kg)    SpO2 99%   BMI 47.12 kg/m  GEN: NAD EYE: Sclerae anicteric ENT: MMM CV: Non-tachycardic Pulm: CTA b/l GI: Soft, NT/ND NEURO:  Alert & Oriented x 3   Gordy Starch, MD [facility]  02/28/2024 1:33 PM  "

## 2024-03-02 ENCOUNTER — Telehealth: Payer: Self-pay

## 2024-03-02 NOTE — Telephone Encounter (Signed)
" °  Follow up Call-     02/28/2024   12:50 PM  Call back number  Post procedure Call Back phone  # [phone]  Permission to leave phone message Yes    Attempted to call patient regarding follow-up. Unable to leave VM- VM not set up. Phone continued to ring endlessly.   "

## 2024-03-05 ENCOUNTER — Telehealth: Payer: Self-pay

## 2024-03-05 NOTE — Telephone Encounter (Signed)
 Dr. Albertus recommended patient be scheduled for a CT scan abd/pelvis for further evaluation of RUQ abd pain. Left message for patient to return my call.

## 2024-03-06 DIAGNOSIS — R1011 Right upper quadrant pain: Secondary | ICD-10-CM

## 2024-03-06 NOTE — Telephone Encounter (Signed)
 Left message for patient to return my call.

## 2024-03-06 NOTE — Telephone Encounter (Signed)
 Called patient and informed her that Dr. Albertus would like her to have a CT scan to evaluate her abdominal pain. Patient agreed. Scheduled patient's CT scan abd/pelvis at [facility] hospital on 03/16/24 at 1:00pm, arriving at 11:00am to drink oral contrast. Also NPO 4 hours prior to scan. Informed patient she needs labs (BUN, Creatine) prior to CT scan to check kidney function. Patient verbalized understanding. Lab orders placed.

## 2024-03-07 NOTE — Telephone Encounter (Signed)
 Left message for patient to return my call.

## 2024-03-08 NOTE — Telephone Encounter (Signed)
 Patient is aware of appointment date, time and location of CT scan. Also, to be npo 4 hours prior to scan. Informed patient to come by our lab in the basement prior to CT scan for lab work. Patient verbalized understanding.

## 2024-03-09 ENCOUNTER — Other Ambulatory Visit

## 2024-03-09 DIAGNOSIS — R1011 Right upper quadrant pain: Secondary | ICD-10-CM

## 2024-03-09 LAB — BUN: BUN: 14 mg/dL (ref 6–23)

## 2024-03-09 LAB — CREATININE, SERUM: Creatinine, Ser: 1.13 mg/dL (ref 0.40–1.20)

## 2024-03-15 ENCOUNTER — Telehealth: Payer: Self-pay | Admitting: Internal Medicine

## 2024-03-15 NOTE — Telephone Encounter (Addendum)
 Noted that patient's CT scan has been rescheduled to 03/23/24 at 4:00 pm.

## 2024-03-15 NOTE — Telephone Encounter (Signed)
 Inbound call from patient stating she wanted to inform Alan about her rescheduling CT due to her mother being sick. Please advise Thank you

## 2024-03-16 ENCOUNTER — Ambulatory Visit (HOSPITAL_COMMUNITY)

## 2024-03-23 ENCOUNTER — Ambulatory Visit (HOSPITAL_COMMUNITY)

## 2024-03-27 ENCOUNTER — Ambulatory Visit: Admitting: Physician Assistant

## 2024-03-27 NOTE — Progress Notes (Unsigned)
 "     Carly Console, PA-C [address] [place], [place]  [address] Phone: [phone]   Primary Care Physician: Gerome Brunet, DO  Primary Gastroenterologist:  Carly Console, PA-C / Dr. Gordy Starch   Chief Complaint: Follow-up chronic diarrhea and upper abdominal pain.       HPI:   Discussed the use of AI scribe software for clinical note transcription with the patient, who gave verbal consent to proceed.  I last saw patient 12/2023 to evaluate chronic diarrhea and chronic RUQ pain.  She also had some early satiety and epigastric pain.  Previously Roux-en-Y gastric bypass and cholecystectomy.  RUQ pain radiated to her right upper back, thought to be musculoskeletal.  Partial relief with Tylenol , tizanidine , and gabapentin .  Stool studies (GI pathogen panel, pancreatic elastase, fecal calprotectin) were ordered but not completed.  02/28/2024 screening colonoscopy: Normal.  No polyps.  No biopsies.  10-year repeat.  02/28/2024 EGD by Dr. Starch: Normal esophagus.  Roux-en-Y gastrojejunostomy with healthy mucosa.  Normal gastric pouch.  Single diminutive and angioectasia without bleeding in the jejunum.  Jejunum otherwise normal.  No biopsies.  History of Present Illness    05/2013 EGD by Dr. Starch: Normal esophagus.  Roux-en-Y anastomosis with healthy appearing mucosa.  Normal Eferret jejunal limb.   04/2013 colonoscopy by Dr. Starch: Normal.  No polyps.  Good prep.  10-year repeat (due 04/2023).  Current Outpatient Medications  Medication Sig Dispense Refill   acetaminophen  (TYLENOL ) 500 MG tablet Take 500 mg by mouth.     amLODipine  (NORVASC ) 5 MG tablet Take 5 mg by mouth daily.     atorvastatin  (LIPITOR) 40 MG tablet Take 1 tablet (40 mg total) by mouth daily for cholesterol 90 tablet 3   buprenorphine  (BUTRANS ) 5 MCG/HR PTWK Place onto the skin once a week. (Patient not taking: Reported on 02/28/2024)     COVID-19 At Home Antigen Test [facility] COVID-19 RAPID TEST) KIT See  Admin Instructions.     diazepam  (VALIUM ) 5 MG tablet Insert 1 tablet (5 mg total) into the vagina every 8 hours as needed for pelvic floor spasm. 60 tablet 2   divalproex  (DEPAKOTE  ER) 500 MG 24 hr tablet Take 1 tablet (500 mg total) by mouth at bedtime. (Patient not taking: Reported on 02/28/2024) 30 tablet 5   FLUoxetine  (PROZAC ) 20 MG capsule Take 1 capsule (20 mg total) by mouth daily. 90 capsule 1   FLUoxetine  (PROZAC ) 40 MG capsule Take 1 capsule (40 mg total) by mouth daily. 90 capsule 0   furosemide  (LASIX ) 20 MG tablet Take 1 tablet (20 mg total) by mouth daily as needed for fluid or edema. 90 tablet 3   gabapentin  (NEURONTIN ) 100 MG capsule Take 200 mg by mouth 4 (four) times daily.     Menthol, Topical Analgesic, (BIOFREEZE ROLL-ON) 4 % GEL      Multiple Vitamin (MULTI VITAMIN) TABS 1 tablet Orally Once a day     potassium chloride  (KLOR-CON  M) 10 MEQ tablet TAKE 1 TABLET BY MOUTH AS NEEDED(TAKE WITH FUROSEMIDE ) 90 tablet 2   QUEtiapine  (SEROQUEL ) 300 MG tablet Take 300 mg by mouth at bedtime.     QUEtiapine  Fumarate 150 MG TABS Take 1 tablet by mouth daily.     sodium chloride  (OCEAN) 0.65 % SOLN nasal spray Place 1 spray into both nostrils as needed for congestion.  0   tiZANidine  (ZANAFLEX ) 2 MG tablet Take 1 tablet (2 mg total) by mouth 3 (three) times daily as needed. (  Patient taking differently: Take 4 mg by mouth 3 (three) times daily as needed for muscle spasms.) 90 tablet 0   tiZANidine  (ZANAFLEX ) 4 MG tablet Take 1 tablet by mouth every eight hours as needed for spasm/pain 40 tablet 0   tiZANidine  (ZANAFLEX ) 4 MG tablet Take 1 tablet by mouth every eight hours as needed for spasm/pain 40 tablet 0   Turmeric POWD Take 1 each by mouth daily.     zoledronic  acid (RECLAST ) 5 MG/100ML SOLN injection as directed Intravenous     No current facility-administered medications for this visit.    Allergies as of 03/27/2024 - Review Complete 02/28/2024  Allergen Reaction Noted    Ciprofloxacin  Other (See Comments) and Anaphylaxis 07/25/2014   Morphine and codeine Shortness Of Breath and Itching 08/20/2011   Septra [sulfamethoxazole-trimethoprim] Anaphylaxis and Hives 08/20/2011   Sulfa antibiotics Anaphylaxis, Hives, and Swelling 05/08/2013   Sulfamethoxazole-trimethoprim Anaphylaxis and Hives 07/25/2014   Sulfasalazine Anaphylaxis, Hives, Swelling, and Other (See Comments) 07/25/2014   Tramadol Anxiety, Other (See Comments), Shortness Of Breath, and Anaphylaxis 08/20/2011   Darvocet [propoxyphene n-acetaminophen ] Itching 08/20/2011   Lisinopril  Cough 01/06/2021   Penicillins Itching 02/28/2024   Azithromycin Other (See Comments) 02/28/2024   Codeine Itching and Other (See Comments) 08/20/2011   Demerol [meperidine] Nausea Only 07/17/2013   Hydromorphone  Itching and Other (See Comments) 07/25/2014   Latex Itching 02/04/2015   Losartan Palpitations 07/25/2014   Tape Rash and Dermatitis 05/24/2013    Past Medical History:  Diagnosis Date   Allergy    Anemia    Anxiety    Arthritis 12/10/2003   Atrophic vaginitis 2009   BV (bacterial vaginosis) 2006   Chronic back pain    Chronic kidney disease    Chronic LLQ pain    Chronic post-traumatic stress disorder (PTSD)    Complication of anesthesia    Elevated LFTs    Endometriosis    Fatigue 2012   Gallstones    GERD (gastroesophageal reflux disease)    H/O bladder infections    H/O mumps    H/O varicella    History of candidal vulvovaginitis 03/21/2003   HTN (hypertension)    Hyperlipidemia    Insomnia    Insomnia secondary to situational depression 06/06/2007   Liver mass, right lobe 08/13/2004   Nodule   Malaise 2012   Morbid obesity (HCC) 2005   Osteoporosis    Pelvic floor dysfunction    S/P gastric bypass    Severe episode of recurrent major depressive disorder, without psychotic features (HCC)    Sleep apnea 2005   SUI (stress urinary incontinence, female) 08/12/2003   Trichomonas     Vitamin D  deficiency    Vulvar lesion 03/21/2003   Yeast infection     Past Surgical History:  Procedure Laterality Date   ABDOMINAL HYSTERECTOMY  2001   APPENDECTOMY  2001   CHOLECYSTECTOMY  2011   DILATION AND CURETTAGE OF UTERUS     GASTRIC BYPASS  2007   [facility], Dr. Gladis   KNEE ARTHROSCOPY Left    LAPAROTOMY     TONSILLECTOMY     WISDOM TOOTH EXTRACTION      Review of Systems:    All systems reviewed and negative except where noted in HPI.    Physical Exam:  There were no vitals taken for this visit. No LMP recorded. Patient has had a hysterectomy.  General: Well-nourished, well-developed in no acute distress.  Lungs: Clear to auscultation bilaterally. Non-labored. Heart: Regular rate and  rhythm, no murmurs rubs or gallops.  Abdomen: Bowel sounds are normal; Abdomen is Soft; No hepatosplenomegaly, masses or hernias;  No Abdominal Tenderness; No guarding or rebound tenderness. Neuro: Alert and oriented x 3.  Grossly intact.  Psych: Alert and cooperative, normal mood and affect.   Imaging Studies: No results found.  Labs: CBC    Component Value Date/Time   WBC 7.0 09/20/2023 1101   RBC 4.87 09/20/2023 1101   HGB 13.6 09/20/2023 1101   HGB 12.3 03/20/2018 1428   HCT 43.3 09/20/2023 1101   HCT 38.7 03/20/2018 1428   PLT 319 09/20/2023 1101   PLT 363 03/20/2018 1428   MCV 88.9 09/20/2023 1101   MCV 82 03/20/2018 1428   MCH 27.9 09/20/2023 1101   MCHC 31.4 (L) 09/20/2023 1101   RDW 13.7 09/20/2023 1101   RDW 13.6 03/20/2018 1428   LYMPHSABS 2.3 12/24/2021 0810   MONOABS 0.8 12/24/2021 0810   EOSABS 0.0 12/24/2021 0810   BASOSABS 0.0 12/24/2021 0810    CMP     Component Value Date/Time   NA 142 04/27/2022 1225   NA 143 03/20/2018 1428   K 4.0 04/27/2022 1225   CL 103 04/27/2022 1225   CO2 30 04/27/2022 1225   GLUCOSE 97 04/27/2022 1225   BUN 14 03/09/2024 1413   BUN 10 03/20/2018 1428   CREATININE 1.13 03/09/2024 1413    CALCIUM  9.8 04/27/2022 1225   PROT 7.4 09/20/2023 1101   PROT 6.5 02/06/2020 1204   ALBUMIN 4.3 09/07/2022 1143   ALBUMIN 4.0 02/06/2020 1204   AST 25 09/20/2023 1101   ALT 22 09/20/2023 1101   ALKPHOS 96 09/07/2022 1143   BILITOT 0.4 09/20/2023 1101   BILITOT 0.3 02/06/2020 1204   GFRNONAA 31 (L) 12/26/2021 1225   GFRAA 70 03/20/2018 1428       Assessment and Plan:   Carly Delacruz is a 59 y.o. y/o female ***  Assessment and Plan Assessment & Plan       Carly Console, PA-C  Follow up ***   "

## 2024-04-02 ENCOUNTER — Ambulatory Visit (HOSPITAL_COMMUNITY)

## 2024-04-03 ENCOUNTER — Ambulatory Visit: Admitting: Neurology

## 2024-04-06 ENCOUNTER — Ambulatory Visit (HOSPITAL_COMMUNITY): Admission: RE | Admit: 2024-04-06

## 2024-04-06 DIAGNOSIS — R1011 Right upper quadrant pain: Secondary | ICD-10-CM

## 2024-04-06 MED ORDER — IOHEXOL 9 MG/ML PO SOLN
500.0000 mL | ORAL | Status: AC
  Administered 2024-04-06 (×2): 500 mL via ORAL

## 2024-04-06 MED ORDER — IOHEXOL 300 MG/ML  SOLN
100.0000 mL | Freq: Once | INTRAMUSCULAR | Status: AC | PRN
  Administered 2024-04-06: 100 mL via INTRAVENOUS

## 2024-10-05 ENCOUNTER — Ambulatory Visit: Payer: Self-pay | Admitting: Neurology
